# Patient Record
Sex: Female | Born: 1935 | Race: White | Hispanic: No | Marital: Married | Smoking: Former smoker
Health system: Southern US, Community
[De-identification: ages and names within clinical notes are randomized; demographics above are authoritative.]

## PROBLEM LIST (undated history)

## (undated) DIAGNOSIS — D369 Benign neoplasm, unspecified site: Secondary | ICD-10-CM

## (undated) DIAGNOSIS — I714 Abdominal aortic aneurysm, without rupture, unspecified: Secondary | ICD-10-CM

## (undated) DIAGNOSIS — Z720 Tobacco use: Secondary | ICD-10-CM

## (undated) DIAGNOSIS — K5790 Diverticulosis of intestine, part unspecified, without perforation or abscess without bleeding: Secondary | ICD-10-CM

## (undated) DIAGNOSIS — D649 Anemia, unspecified: Secondary | ICD-10-CM

## (undated) DIAGNOSIS — H269 Unspecified cataract: Secondary | ICD-10-CM

## (undated) DIAGNOSIS — I701 Atherosclerosis of renal artery: Secondary | ICD-10-CM

## (undated) DIAGNOSIS — N289 Disorder of kidney and ureter, unspecified: Secondary | ICD-10-CM

## (undated) DIAGNOSIS — K5900 Constipation, unspecified: Secondary | ICD-10-CM

## (undated) DIAGNOSIS — E785 Hyperlipidemia, unspecified: Secondary | ICD-10-CM

## (undated) DIAGNOSIS — I219 Acute myocardial infarction, unspecified: Secondary | ICD-10-CM

## (undated) DIAGNOSIS — I2510 Atherosclerotic heart disease of native coronary artery without angina pectoris: Secondary | ICD-10-CM

## (undated) DIAGNOSIS — I10 Essential (primary) hypertension: Secondary | ICD-10-CM

## (undated) DIAGNOSIS — K635 Polyp of colon: Secondary | ICD-10-CM

## (undated) DIAGNOSIS — E039 Hypothyroidism, unspecified: Secondary | ICD-10-CM

## (undated) HISTORY — DX: Constipation, unspecified: K59.00

## (undated) HISTORY — DX: Abdominal aortic aneurysm, without rupture, unspecified: I71.40

## (undated) HISTORY — DX: Anemia, unspecified: D64.9

## (undated) HISTORY — PX: CATARACT EXTRACTION, BILATERAL: SHX1313

## (undated) HISTORY — PX: POLYPECTOMY: SHX149

## (undated) HISTORY — DX: Polyp of colon: K63.5

## (undated) HISTORY — DX: Atherosclerotic heart disease of native coronary artery without angina pectoris: I25.10

## (undated) HISTORY — DX: Unspecified cataract: H26.9

## (undated) HISTORY — PX: COLONOSCOPY: SHX174

## (undated) HISTORY — PX: CARPAL TUNNEL RELEASE: SHX101

## (undated) HISTORY — DX: Disorder of kidney and ureter, unspecified: N28.9

## (undated) HISTORY — DX: Diverticulosis of intestine, part unspecified, without perforation or abscess without bleeding: K57.90

## (undated) HISTORY — DX: Essential (primary) hypertension: I10

## (undated) HISTORY — DX: Hyperlipidemia, unspecified: E78.5

## (undated) HISTORY — DX: Tobacco use: Z72.0

## (undated) HISTORY — DX: Abdominal aortic aneurysm, without rupture: I71.4

## (undated) HISTORY — PX: ABDOMINAL HYSTERECTOMY: SHX81

## (undated) HISTORY — DX: Atherosclerosis of renal artery: I70.1

## (undated) HISTORY — PX: CHOLECYSTECTOMY: SHX55

## (undated) HISTORY — DX: Hypothyroidism, unspecified: E03.9

## (undated) HISTORY — DX: Benign neoplasm, unspecified site: D36.9

---

## 1999-02-20 ENCOUNTER — Encounter: Admission: RE | Admit: 1999-02-20 | Discharge: 1999-05-21 | Payer: Self-pay | Admitting: Family Medicine

## 1999-03-27 ENCOUNTER — Encounter: Admission: RE | Admit: 1999-03-27 | Discharge: 1999-04-10 | Payer: Self-pay | Admitting: *Deleted

## 2002-06-27 ENCOUNTER — Encounter: Payer: Self-pay | Admitting: Gastroenterology

## 2003-06-05 ENCOUNTER — Encounter (INDEPENDENT_AMBULATORY_CARE_PROVIDER_SITE_OTHER): Payer: Self-pay | Admitting: Specialist

## 2003-06-05 ENCOUNTER — Observation Stay (HOSPITAL_COMMUNITY): Admission: RE | Admit: 2003-06-05 | Discharge: 2003-06-06 | Payer: Self-pay | Admitting: General Surgery

## 2004-03-12 ENCOUNTER — Ambulatory Visit: Payer: Self-pay | Admitting: Family Medicine

## 2004-06-03 ENCOUNTER — Ambulatory Visit: Payer: Self-pay | Admitting: Family Medicine

## 2004-09-16 ENCOUNTER — Ambulatory Visit: Payer: Self-pay | Admitting: Family Medicine

## 2004-11-05 ENCOUNTER — Ambulatory Visit: Payer: Self-pay | Admitting: Family Medicine

## 2004-12-02 ENCOUNTER — Ambulatory Visit: Payer: Self-pay | Admitting: Family Medicine

## 2005-01-24 ENCOUNTER — Encounter: Admission: RE | Admit: 2005-01-24 | Discharge: 2005-01-24 | Payer: Self-pay | Admitting: Orthopaedic Surgery

## 2005-02-14 ENCOUNTER — Encounter: Admission: RE | Admit: 2005-02-14 | Discharge: 2005-02-14 | Payer: Self-pay | Admitting: Orthopaedic Surgery

## 2005-03-07 ENCOUNTER — Encounter: Admission: RE | Admit: 2005-03-07 | Discharge: 2005-03-07 | Payer: Self-pay | Admitting: Orthopaedic Surgery

## 2005-04-18 ENCOUNTER — Ambulatory Visit: Payer: Self-pay | Admitting: Family Medicine

## 2005-10-29 ENCOUNTER — Ambulatory Visit: Payer: Self-pay | Admitting: Family Medicine

## 2006-01-13 ENCOUNTER — Ambulatory Visit: Payer: Self-pay | Admitting: Family Medicine

## 2006-02-19 ENCOUNTER — Ambulatory Visit: Payer: Self-pay | Admitting: Family Medicine

## 2006-03-04 ENCOUNTER — Ambulatory Visit: Payer: Self-pay | Admitting: Family Medicine

## 2006-03-11 ENCOUNTER — Ambulatory Visit: Payer: Self-pay | Admitting: Family Medicine

## 2006-03-17 HISTORY — PX: CARDIAC CATHETERIZATION: SHX172

## 2006-03-18 ENCOUNTER — Ambulatory Visit: Payer: Self-pay | Admitting: Family Medicine

## 2006-03-25 ENCOUNTER — Ambulatory Visit: Payer: Self-pay | Admitting: Family Medicine

## 2006-04-16 ENCOUNTER — Ambulatory Visit: Payer: Self-pay | Admitting: Cardiology

## 2006-04-16 ENCOUNTER — Inpatient Hospital Stay (HOSPITAL_COMMUNITY): Admission: AD | Admit: 2006-04-16 | Discharge: 2006-04-19 | Payer: Self-pay | Admitting: Cardiology

## 2006-04-23 ENCOUNTER — Ambulatory Visit: Payer: Self-pay | Admitting: Gastroenterology

## 2006-04-27 ENCOUNTER — Ambulatory Visit: Payer: Self-pay | Admitting: Family Medicine

## 2006-05-07 ENCOUNTER — Ambulatory Visit: Payer: Self-pay | Admitting: Cardiology

## 2006-05-20 ENCOUNTER — Ambulatory Visit: Payer: Self-pay | Admitting: Cardiology

## 2006-05-28 ENCOUNTER — Ambulatory Visit: Payer: Self-pay | Admitting: Family Medicine

## 2006-06-10 ENCOUNTER — Ambulatory Visit: Payer: Self-pay | Admitting: Family Medicine

## 2006-07-14 ENCOUNTER — Ambulatory Visit: Payer: Self-pay | Admitting: Family Medicine

## 2006-08-17 ENCOUNTER — Ambulatory Visit: Payer: Self-pay | Admitting: Family Medicine

## 2006-08-19 ENCOUNTER — Ambulatory Visit: Payer: Self-pay | Admitting: Cardiology

## 2006-09-29 ENCOUNTER — Ambulatory Visit: Payer: Self-pay | Admitting: Vascular Surgery

## 2006-10-08 ENCOUNTER — Ambulatory Visit: Payer: Self-pay | Admitting: Vascular Surgery

## 2006-10-08 ENCOUNTER — Inpatient Hospital Stay (HOSPITAL_COMMUNITY): Admission: RE | Admit: 2006-10-08 | Discharge: 2006-10-11 | Payer: Self-pay | Admitting: Vascular Surgery

## 2006-11-03 ENCOUNTER — Ambulatory Visit: Payer: Self-pay | Admitting: Vascular Surgery

## 2007-02-09 ENCOUNTER — Ambulatory Visit: Payer: Self-pay | Admitting: Vascular Surgery

## 2007-02-17 ENCOUNTER — Ambulatory Visit: Payer: Self-pay | Admitting: Cardiology

## 2007-02-26 ENCOUNTER — Ambulatory Visit: Payer: Self-pay

## 2007-02-26 ENCOUNTER — Encounter: Payer: Self-pay | Admitting: Cardiology

## 2007-06-29 ENCOUNTER — Ambulatory Visit: Payer: Self-pay | Admitting: Vascular Surgery

## 2007-11-03 ENCOUNTER — Ambulatory Visit: Payer: Self-pay | Admitting: Cardiology

## 2008-01-04 ENCOUNTER — Ambulatory Visit: Payer: Self-pay | Admitting: Vascular Surgery

## 2008-06-14 ENCOUNTER — Ambulatory Visit: Payer: Self-pay | Admitting: Cardiology

## 2008-06-20 ENCOUNTER — Ambulatory Visit: Payer: Self-pay | Admitting: Vascular Surgery

## 2008-09-04 ENCOUNTER — Telehealth (INDEPENDENT_AMBULATORY_CARE_PROVIDER_SITE_OTHER): Payer: Self-pay | Admitting: *Deleted

## 2008-09-05 ENCOUNTER — Encounter: Payer: Self-pay | Admitting: Cardiology

## 2008-09-05 ENCOUNTER — Ambulatory Visit: Payer: Self-pay

## 2008-09-08 ENCOUNTER — Ambulatory Visit: Payer: Self-pay | Admitting: Vascular Surgery

## 2008-10-27 ENCOUNTER — Encounter: Payer: Self-pay | Admitting: Cardiology

## 2008-11-01 ENCOUNTER — Encounter (INDEPENDENT_AMBULATORY_CARE_PROVIDER_SITE_OTHER): Payer: Self-pay | Admitting: *Deleted

## 2008-12-25 DIAGNOSIS — E785 Hyperlipidemia, unspecified: Secondary | ICD-10-CM | POA: Insufficient documentation

## 2008-12-25 DIAGNOSIS — I714 Abdominal aortic aneurysm, without rupture, unspecified: Secondary | ICD-10-CM | POA: Insufficient documentation

## 2008-12-25 DIAGNOSIS — N259 Disorder resulting from impaired renal tubular function, unspecified: Secondary | ICD-10-CM | POA: Insufficient documentation

## 2008-12-25 DIAGNOSIS — I701 Atherosclerosis of renal artery: Secondary | ICD-10-CM | POA: Insufficient documentation

## 2008-12-25 DIAGNOSIS — F17200 Nicotine dependence, unspecified, uncomplicated: Secondary | ICD-10-CM

## 2008-12-25 DIAGNOSIS — I2510 Atherosclerotic heart disease of native coronary artery without angina pectoris: Secondary | ICD-10-CM

## 2008-12-26 ENCOUNTER — Ambulatory Visit: Payer: Self-pay | Admitting: Vascular Surgery

## 2008-12-27 ENCOUNTER — Ambulatory Visit: Payer: Self-pay | Admitting: Cardiology

## 2009-01-29 ENCOUNTER — Encounter: Payer: Self-pay | Admitting: Cardiology

## 2009-02-07 ENCOUNTER — Telehealth (INDEPENDENT_AMBULATORY_CARE_PROVIDER_SITE_OTHER): Payer: Self-pay | Admitting: *Deleted

## 2009-03-17 DIAGNOSIS — K635 Polyp of colon: Secondary | ICD-10-CM

## 2009-03-17 HISTORY — DX: Polyp of colon: K63.5

## 2009-04-30 ENCOUNTER — Encounter (INDEPENDENT_AMBULATORY_CARE_PROVIDER_SITE_OTHER): Payer: Self-pay | Admitting: *Deleted

## 2009-05-04 ENCOUNTER — Encounter: Payer: Self-pay | Admitting: Cardiology

## 2009-05-25 ENCOUNTER — Telehealth: Payer: Self-pay | Admitting: Gastroenterology

## 2009-05-25 ENCOUNTER — Ambulatory Visit: Payer: Self-pay | Admitting: Gastroenterology

## 2009-05-28 ENCOUNTER — Ambulatory Visit: Payer: Self-pay | Admitting: Gastroenterology

## 2009-05-28 DIAGNOSIS — Z860100 Personal history of colon polyps, unspecified: Secondary | ICD-10-CM | POA: Insufficient documentation

## 2009-06-19 ENCOUNTER — Ambulatory Visit: Payer: Self-pay | Admitting: Vascular Surgery

## 2009-06-29 ENCOUNTER — Ambulatory Visit: Payer: Self-pay | Admitting: Gastroenterology

## 2009-07-02 ENCOUNTER — Telehealth: Payer: Self-pay | Admitting: Gastroenterology

## 2009-07-04 ENCOUNTER — Encounter: Payer: Self-pay | Admitting: Gastroenterology

## 2009-08-03 ENCOUNTER — Encounter: Payer: Self-pay | Admitting: Gastroenterology

## 2009-08-06 ENCOUNTER — Telehealth (INDEPENDENT_AMBULATORY_CARE_PROVIDER_SITE_OTHER): Payer: Self-pay | Admitting: *Deleted

## 2010-01-09 ENCOUNTER — Ambulatory Visit: Payer: Self-pay | Admitting: Cardiology

## 2010-02-27 ENCOUNTER — Ambulatory Visit: Payer: Self-pay | Admitting: Vascular Surgery

## 2010-04-05 ENCOUNTER — Encounter: Payer: Self-pay | Admitting: Cardiology

## 2010-04-18 NOTE — Consult Note (Signed)
Summary: [facility]  [facility]   Imported By: Marylou Mccoy 03/31/2009 11:06:42  _____________________________________________________________________  External Attachment:    Type:   Image     Comment:   External Document

## 2010-04-18 NOTE — Letter (Signed)
Summary: Previsit letter  [facility]  [address] [place], [place] [address]   Phone: [phone]  Fax: [phone]       04/30/2009 MRN: [id number]  [facility] [address] [place], [place]  [address]  Dear Ms. Bugarin,  Welcome to the Gastroenterology Division at [facility].    You are scheduled to see a nurse for your pre-procedure visit on 05-25-09 at 10:30am on the 3rd floor at [facility], [address]  We ask that you try to arrive at our office 15 minutes prior to your appointment time to allow for check-in.  Your nurse visit will consist of discussing your medical and surgical history, your immediate family medical history, and your medications.    Please bring a complete list of all your medications or, if you prefer, bring the medication bottles and we will list them.  We will need to be aware of both prescribed and over the counter drugs.  We will need to know exact dosage information as well.  If you are on blood thinners (Coumadin, Plavix, Aggrenox, Ticlid, etc.) please call our office today/prior to your appointment, as we need to consult with your physician about holding your medication.   Please be prepared to read and sign documents such as consent forms, a financial agreement, and acknowledgement forms.  If necessary, and with your consent, a friend or relative is welcome to sit-in on the nurse visit with you.  Please bring your insurance card so that we may make a copy of it.  If your insurance requires a referral to see a specialist, please bring your referral form from your primary care physician.  No co-pay is required for this nurse visit.     If you cannot keep your appointment, please call [phone] to cancel or reschedule prior to your appointment date.  This allows [place] the opportunity to schedule an appointment for another patient in need of care.    Thank you for choosing [facility] for your medical needs.  We  appreciate the opportunity to care for you.  Please visit [place] at our website  to learn more about our practice.                     Sincerely.                                                                                                                   The Gastroenterology Division

## 2010-04-18 NOTE — Miscellaneous (Signed)
Summary: [facility]  Clinical Lists Changes   pt is taking Plavix. Office visit scheduled with Dr. Arlyce Dice, colonoscopy cancelled.

## 2010-04-18 NOTE — Assessment & Plan Note (Signed)
Summary: [name]      Allergies Added:   Visit Type:  Follow-up Primary [name]:  [name]  CC:  CAD.  History of Present Illness: The patient presents for followup of her known coronary disease. Since I last saw her she has had no new cardiovascular complaints. She has been on the list for a kidney but actually her renal function has been stable with a creatinine of 2.99 she has been told she doesn't transplant at this point. She has had no new cardiovascular complaints. She stays active and works though she is not exercising routinely. With her activities of daily living she denies any chest pressure, neck or arm discomfort. She denies any shortness of breath, PND or orthopnea. She has had no palpitations, presyncope or syncope. She has had no weight gain or edema.  Current Medications (verified): 1)  Coreg 12.5 Mg Tabs (Carvedilol) .Marland Kitchen.. 1 By Mouth Two Times A Day 2)  Plavix 75 Mg Tabs (Clopidogrel Bisulfate) .Marland Kitchen.. 1 By Mouth Daily 3)  Colace 100 Mg Caps (Docusate Sodium) .Marland Kitchen.. 1 By Mouth Two Times A Day 4)  Procardia Xl 60 Mg Xr24h-Tab (Nifedipine) .Marland Kitchen.. 1 By Mouth Once Daily 5)  Aspirin 81 Mg Tbec (Aspirin) .Marland Kitchen.. 1 By Mouth Once Daily 6)  Crestor 20 Mg Tabs (Rosuvastatin Calcium) .Marland Kitchen.. 1 By Mouth Once Daily 7)  Hemorrhoidal-Hc 25 Mg Supp (Hydrocortisone Acetate) .... Put 1 in Rectum Every Night At Bedtime For 7 Nights. 8)  Nitrostat 0.4 Mg Subl (Nitroglycerin) .Marland Kitchen.. 1 By Mouth As Needed For Chest Pain As Directed  Allergies (verified): 1)  ! Sulfa  Past History:  Past Medical History: Coronary artery disease (50% proximal LAD   stenosis, 50% marginal stenosis of the circumflex, right coronary artery   95% stenosis, EF 40% stenosis with hypokinesis of the inferior wall.   She had a Taxus stent placed in January 31st 2008) Hypertension,  Abdominal aortic aneurysm Renal artery stenosis status post stenting of  the right renal, atrophic left  renal Dyslipidemia Hypothyroidism,  Previous tobacco use.  Adenomatous polyp 2004 Hyperplastic polyp    2004 Hemorrhoids Diverticulosis  Past Surgical History: Hysterectomy. Carpal tunnel repair. Cholecystectomy  Review of Systems       As stated in the HPI and negative for all other systems.   Vital Signs:  Patient profile:   76 year old female Height:      65 inches Weight:      145 pounds BMI:     24.22 Pulse rate:   62 / minute Resp:     16 per minute BP sitting:   112 / 64  (right arm)  Vitals Entered By: Marrion Coy, CNA (January 09, 2010 1:33 PM)  Physical Exam  General:  Well developed, well nourished, in no acute distress. Head:  normocephalic and atraumatic Eyes:  PERRLA/EOM intact; conjunctiva and lids normal. Neck:  Neck supple, no JVD. No masses, thyromegaly or abnormal cervical nodes. Chest Wall:  no deformities or breast masses noted Lungs:  Clear bilaterally to auscultation and percussion. Abdomen:  Bowel sounds positive; abdomen soft and non-tender without masses, organomegaly, or hernias noted. No hepatosplenomegaly. Msk:  Back normal, normal gait. Muscle strength and tone normal. Extremities:  No clubbing or cyanosis. Neurologic:  Alert and oriented x 3. Skin:  Intact without lesions or rashes. Cervical Nodes:  no significant adenopathy Inguinal Nodes:  no significant adenopathy Psych:  Normal affect.   Detailed Cardiovascular Exam  Neck    Carotids: Carotids  full and equal bilaterally without bruits.      Neck Veins: Normal, no JVD.    Heart    Inspection: no deformities or lifts noted.      Palpation: normal PMI with no thrills palpable.      Auscultation: regular rate and rhythm, S1, S2 without murmurs, rubs, gallops, or clicks.    Vascular    Abdominal Aorta: no palpable masses, pulsations, or audible bruits.      Femoral Pulses: normal femoral pulses bilaterally.      Pedal Pulses: normal pedal pulses bilaterally.      Radial  Pulses: normal radial pulses bilaterally.      Peripheral Circulation: no clubbing, cyanosis, or edema noted with normal capillary refill.     EKG  Procedure date:  01/09/2010  Findings:      Normal sinus rhythm, rate 62, axis within normal limits, intervals within normal limits, no acute ST-T wave changes  Impression & Recommendations:  Problem # 1:  CAD (ICD-414.00) She has no new symptoms. She had a stress perfusion study in 2009. No new evaluations or change in therapy are indicated. Orders: EKG w/ Interpretation (93000)  Problem # 2:  DYSLIPIDEMIA (ICD-272.4) I reviewed her lipids from May of this year with an LDL of 82 with HDL of 41. She will remain on the meds as listed.  Problem # 3:  AAA (ICD-441.4) I encouraged her to call the vascular surgery office to find out when she is to have followup of this.  Patient Instructions: 1)  Your physician recommends that you schedule a follow-up appointment in: 12 months with Dr Antoine Poche in [place] 2)  Your physician recommends that you continue on your current medications as directed. Please refer to the Current Medication list given to you today.

## 2010-04-18 NOTE — Letter (Signed)
Summary: [facility] Instructions  [facility]  [address] [place], [place] [address]   Phone: [phone]  Fax: [phone]       [name]    December 30, 1934    MRN: [id number]        Procedure Day /Date:FRIDAY 06/29/2009     Arrival Time:1PM     Procedure Time:2PM     Location of Procedure:                    X   [facility] (4th Floor)                        PREPARATION FOR COLONOSCOPY WITH MOVIPREP   Starting 5 days prior to your procedure 06/24/2009 do not eat nuts, seeds, popcorn, corn, beans, peas,  salads, or any raw vegetables.  Do not take any fiber supplements (e.g. Metamucil, Citrucel, and Benefiber).  THE DAY BEFORE YOUR PROCEDURE         DATE:06/28/2009  DAY: THURSDAY  1.  Drink clear liquids the entire day-NO SOLID FOOD  2.  Do not drink anything colored red or purple.  Avoid juices with pulp.  No orange juice.  3.  Drink at least 64 oz. (8 glasses) of fluid/clear liquids during the day to prevent dehydration and help the prep work efficiently.  CLEAR LIQUIDS INCLUDE: Water Jello Ice Popsicles Tea (sugar ok, no milk/cream) Powdered fruit flavored drinks Coffee (sugar ok, no milk/cream) Gatorade Juice: apple, white grape, white cranberry  Lemonade Clear bullion, consomm, broth Carbonated beverages (any kind) Strained chicken noodle soup Hard Candy                             4.  In the morning, mix first dose of MoviPrep solution:    Empty 1 Pouch A and 1 Pouch B into the disposable container    Add lukewarm drinking water to the top line of the container. Mix to dissolve    Refrigerate (mixed solution should be used within 24 hrs)  5.  Begin drinking the prep at 5:00 p.m. The MoviPrep container is divided by 4 marks.   Every 15 minutes drink the solution down to the next mark (approximately 8 oz) until the full liter is complete.   6.  Follow completed prep with 16 oz of clear liquid of your choice (Nothing red or  purple).  Continue to drink clear liquids until bedtime.  7.  Before going to bed, mix second dose of MoviPrep solution:    Empty 1 Pouch A and 1 Pouch B into the disposable container    Add lukewarm drinking water to the top line of the container. Mix to dissolve    Refrigerate  THE DAY OF YOUR PROCEDURE      DATE: 06/29/2009 DAY: FRIDAY Beginning at9a.m. (5 hours before procedure):         1. Every 15 minutes, drink the solution down to the next mark (approx 8 oz) until the full liter is complete.  2. Follow completed prep with 16 oz. of clear liquid of your choice.    3. You may drink clear liquids until 12PM (2 HOURS BEFORE PROCEDURE).   MEDICATION INSTRUCTIONS  Unless otherwise instructed, you should take regular prescription medications with a small sip of water   as early as possible the morning of your procedure.  Diabetic patients - see separate instructions.  Stop taking Plavix 06/22/2009    (7 days before procedure).   PER DR KAPLAN         OTHER INSTRUCTIONS  You will need a responsible adult at least 74 years of age to accompany you and drive you home.   This person must remain in the waiting room during your procedure.  Wear loose fitting clothing that is easily removed.  Leave jewelry and other valuables at home.  However, you may wish to bring a book to read or  an iPod/MP3 player to listen to music as you wait for your procedure to start.  Remove all body piercing jewelry and leave at home.  Total time from sign-in until discharge is approximately 2-3 hours.  You should go home directly after your procedure and rest.  You can resume normal activities the  day after your procedure.  The day of your procedure you should not:   Drive   Make legal decisions   Operate machinery   Drink alcohol   Return to work  You will receive specific instructions about eating, activities and medications before you leave.    The above instructions have  been reviewed and explained to me by   _______________________    I fully understand and can verbalize these instructions _____________________________ Date _________

## 2010-04-18 NOTE — Progress Notes (Signed)
Summary: Triage-Rectal Bleeding  Medications Added HEMORRHOIDAL-HC 25 MG SUPP (HYDROCORTISONE ACETATE) Put 1 in rectum every night at bedtime for 7 nights.       Phone Note Call from Patient Call back at Home Phone [phone]   Caller: Patient Call For: Dr. Arlyce Dice Reason for Call: Talk to Nurse Summary of Call: pt is having problems w/her hemorroids [facility] Initial call taken by: Karna Christmas,  July 02, 2009 10:21 AM  Follow-up for Phone Call        Message left for patient to callback. Laureen Ochs LPN  July 02, 2009 12:01 PM  Message left for patient to callback. Laureen Ochs LPN  July 02, 2009 3:36 PM  Message left for patient to callback. Laureen Ochs LPN  July 03, 2009 8:22 AM  Message left for patient to callback. Laureen Ochs LPN  July 03, 2009 2:49 PM    (Pt. had a Colon/polypectomy on 06-29-09. ) Pt. states her hemorrhoids were inflammed and bleeding before the Colonoscopy, pt. states it has almost stopped, but she would like some medicine for them. Denies rectal pain, burning, itching, constipation, fever, abd. pain.   1) Anusol HC supp. 1 pr qhs x 7  nights,sitz bath TID,Tucks pads to rectum TID,use baby wipes instead of toilet paper. 2) If symptoms become worse call back immediately or go to ER.   Follow-up by: Laureen Ochs LPN,  July 03, 2009 4:33 PM  Additional Follow-up for Phone Call Additional follow up Details #1::        Please call the office in 5 days to report an update of your condition. Pt had small polyps removed,  if bleeding worsens she needs to call and be evaluated.  Additional Follow-up by: Louis Meckel MD,  July 04, 2009 8:59 AM    Additional Follow-up for Phone Call Additional follow up Details #2::    Message left for pt. with above MD instructions. Follow-up by: Laureen Ochs LPN,  July 04, 2009 9:37 AM  New/Updated Medications: HEMORRHOIDAL-HC 25 MG SUPP (HYDROCORTISONE ACETATE) Put 1 in  rectum every night at bedtime for 7 nights. Prescriptions: HEMORRHOIDAL-HC 25 MG SUPP (HYDROCORTISONE ACETATE) Put 1 in rectum every night at bedtime for 7 nights.  #7 x 1   Entered by:   Laureen Ochs LPN   Authorized by:   Louis Meckel MD   Signed by:   Laureen Ochs LPN on 02/72/5366   Method used:   Electronically to        [facility] [phone]* (retail)       [address] [place], [place]  [address]       Ph: [phone] or [phone]       Fax: [phone]   RxID:   [id number]

## 2010-04-18 NOTE — Letter (Signed)
Summary: [facility] Office Note  [facility] Office Note   Imported By: Roderic Ovens 05/28/2009 15:00:33  _____________________________________________________________________  External Attachment:    Type:   Image     Comment:   External Document

## 2010-04-18 NOTE — Progress Notes (Signed)
Summary: Triage   Phone Note Call from Patient Call back at Home Phone [phone]   Caller: Patient Call For: Dr. Arlyce Dice Reason for Call: Talk to Nurse Summary of Call: pt. was in the office for a previsit. Is on BT and had to r/s appt. until 06-21-09 for OV. Would like a sooner appt. b/c she needs procedure done b/c she is trying to get on a waiting list at [facility] for a kidney transplant. Initial call taken by: Karna Christmas,  May 25, 2009 10:31 AM  Follow-up for Phone Call        Provide her a sooner ap't with MD or PA Follow-up by: Louis Meckel MD,  May 25, 2009 11:36 AM  Additional Follow-up for Phone Call Additional follow up Details #1::        (Pt. can see Dr.Thien Berka on 05-28-09 at 1:30pm.)  Message left for patient to callback.  Additional Follow-up by: Laureen Ochs LPN,  May 25, 2009 11:38 AM    Additional Follow-up for Phone Call Additional follow up Details #2::    Pt. confirmed she will be here for appt. 05-28-09 at 1:30pm. Follow-up by: Laureen Ochs LPN,  May 25, 2009 3:24 PM

## 2010-04-18 NOTE — Letter (Signed)
Summary: Patient Notice- Polyp Results  [facility]  [address] [place], [place] [address]   Phone: [phone]  Fax: [phone]        July 04, 2009 MRN: [id number]    [facility] [place] [address] [place], [place]  [address]    Dear Ms. Arlen,  I am pleased to inform you that the colon polyp(s) removed during your recent colonoscopy was (were) found to be benign (no cancer detected) upon pathologic examination.  I recommend you have a repeat colonoscopy examination in 5_ years to look for recurrent polyps, as having colon polyps increases your risk for having recurrent polyps or even colon cancer in the future.  Should you develop new or worsening symptoms of abdominal pain, bowel habit changes or bleeding from the rectum or bowels, please schedule an evaluation with either your primary care physician or with me.  Additional information/recommendations:  __ No further action with gastroenterology is needed at this time. Please      follow-up with your primary care physician for your other healthcare      needs.  __ Please call [phone] to schedule a return visit to review your      situation.  __ Please keep your follow-up visit as already scheduled.  _x_ Continue treatment plan as outlined the day of your exam.  Please call us if you are having persistent problems or have questions about your condition that have not been fully answered at this time.  Sincerely,  Louis Meckel MD  This letter has been electronically signed by your physician.  Appended Document: Patient Notice- Polyp Results letter mailed 4.25.11

## 2010-04-18 NOTE — Assessment & Plan Note (Signed)
Summary: (1:30PM APPT) NEEDS COLONOSCOPY, IS ON BLOOD THINNERS        ...    History of Present Illness Visit Type: Initial Visit Primary GI MD: Melvia Heaps MD [facility] Primary Provider: [name] Chief Complaint: discuss colon on blood thinner History of Present Illness:   [name] is a pleasant 76 year old white female referred at the request of Dr. Lysbeth Galas for a colonoscopy.  She has a history of adenomatous polyps and was last examined in 2004.  She has no GI complaints except for occasional hemorrhoidal discomfort with minimal amount of bleeding.  The patient has coronary artery disease and is on Plavix.  She has also has renal insufficiency and is scheduled to have a kidney transplant.   GI Review of Systems    Reports bloating.      Denies abdominal pain, acid reflux, belching, chest pain, dysphagia with liquids, dysphagia with solids, heartburn, loss of appetite, nausea, vomiting, vomiting blood, weight loss, and  weight gain.      Reports constipation, hemorrhoids, and  rectal bleeding.     Denies anal fissure, black tarry stools, change in bowel habit, diarrhea, diverticulosis, fecal incontinence, heme positive stool, irritable bowel syndrome, jaundice, light color stool, liver problems, and  rectal pain.    Current Medications (verified): 1)  Coreg 12.5 Mg Tabs (Carvedilol) .Marland Kitchen.. 1 By Mouth Two Times A Day 2)  Plavix 75 Mg Tabs (Clopidogrel Bisulfate) .Marland Kitchen.. 1 By Mouth Daily 3)  Colace 100 Mg Caps (Docusate Sodium) .Marland Kitchen.. 1 By Mouth Two Times A Day 4)  Procardia Xl 60 Mg Xr24h-Tab (Nifedipine) .Marland Kitchen.. 1 By Mouth Once Daily 5)  Aspirin 81 Mg Tbec (Aspirin) .Marland Kitchen.. 1 By Mouth Once Daily 6)  Crestor 20 Mg Tabs (Rosuvastatin Calcium) .Marland Kitchen.. 1 By Mouth Once Daily  Allergies (verified): 1)  ! Sulfa  Past History:  Past Medical History: Reviewed history from 05/28/2009 and no changes required. Coronary artery disease (50% proximal LAD   stenosis, 50% marginal stenosis of the  circumflex, right coronary artery   95% stenosis, EF 40% stenosis with hypokinesis of the inferior wall.   She had a Taxus stent placed in January 31st 2008), hypertension,   abdominal aortic aneurysm, renal artery stenosis status post stenting of   the right renal, atrophic left renal, dyslipidemia, hypothyroidism,   renal insufficiency, and previous tobacco use.    Adenomatous polyp 2004   hyperplastic polyp    2004 Hemorrhoids Diverticulosis  Past Surgical History:  1. Hysterectomy.  2. Carpal tunnel repair. Cholecystectomy  Family History: Reviewed history from 12/25/2008 and no changes required.  Father died of a myocardial infarction at age 54.  Brother died of a myocardial infarction at age 58.  Mother died of  cancer.  She has a sister with renal artery stenosis and a stent to that  artery, along with peripheral vascular disease.  Social History: Reviewed history from 12/25/2008 and no changes required. The patient smokes 3-4 cigarettes a day and has been  doing so x5 years.  Negative for ETOH.  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  Review of Systems       The patient complains of back pain and muscle pains/cramps.  The patient denies allergy/sinus, anemia, anxiety-new, arthritis/joint pain, blood in urine, breast changes/lumps, change in vision, confusion, cough, coughing up blood, depression-new, fainting, fatigue, fever, headaches-new, hearing problems, heart murmur, heart rhythm changes, itching, menstrual pain, night sweats, nosebleeds, pregnancy symptoms, shortness of breath, skin rash, sleeping  problems, sore throat, swelling of feet/legs, swollen lymph glands, thirst - excessive , urination - excessive , urination changes/pain, urine leakage, vision changes, and voice change.         All other systems were reviewed and were negative   Vital Signs:  Patient profile:   76 year old female Height:      65 inches Weight:      144.38 pounds BMI:      24.11 Pulse rate:   72 / minute Pulse rhythm:   regular BP sitting:   106 / 60  (left arm)  Vitals Entered By: Chales Abrahams CMA Duncan Dull) (May 28, 2009 1:43 PM)  Physical Exam  Additional Exam:  On physical exam she is a well-developed well-nourished female  skin: anicteric HEENT: normocephalic; PEERLA; no nasal or pharyngeal abnormalities neck: supple nodes: no cervical lymphadenopathy chest: clear to ausculatation and percussion heart: no murmurs, gallops, or rubs abd: soft, nontender; BS normoactive; no abdominal masses, tenderness, organomegaly rectal: deferred ext: no cynanosis, clubbing, edema skeletal: no deformities neuro: oriented x 3; no focal abnormalities    Impression & Recommendations:  Problem # 1:  PERSONAL HISTORY OF COLONIC POLYPS (ICD-V12.72) Patient will be scheduled for followup colonoscopy.  Plavix will be held in advance of the procedure.  Risks, alternatives, and complications of the procedure, including bleeding, perforation, and possible need for surgery, were explained to the patient.  Patient's questions were answered.  Problem # 2:  RENAL INSUFFICIENCY (ICD-588.9) Assessment: Comment Only  Problem # 3:  CAD (ICD-414.00) Assessment: Comment Only  Patient Instructions: 1)  Your colonoscopy is scheduled for 06/29/2009 at 2pm 2)  You can pick up your MoviPrep from your pharmacy today 3)  Hold Plavix 7 days prior to your procedure per Dr Arlyce Dice 4)  The medication list was reviewed and reconciled.  All changed / newly prescribed medications were explained.  A complete medication list was provided to the patient / caregiver. Prescriptions: MOVIPREP 100 GM  SOLR (PEG-KCL-NACL-NASULF-NA ASC-C) As per prep instructions.  #1 x 0   Entered by:   Merri Ray CMA (AAMA)   Authorized by:   Louis Meckel MD   Signed by:   Merri Ray CMA (AAMA) on 05/28/2009   Method used:   Electronically to        [facility] [phone]* (retail)       [address] [place], [place]  [address]       Ph: [phone] or [phone]       Fax: [phone]   RxID:   [id number]   Appended Document: Orders Update    Clinical Lists Changes  Orders: Added new Test order of Colonoscopy (Colon) - Signed

## 2010-04-18 NOTE — Procedures (Signed)
Summary: Colonoscopy  Patient: [name] Note: All result statuses are Final unless otherwise noted.  Tests: (1) Colonoscopy (COL)   COL Colonoscopy           DONE     [facility]     [address]     [place], [place]  [address]           COLONOSCOPY PROCEDURE REPORT           PATIENT:  [name], [name]  MR#:  [id number]     BIRTHDATE:  1934-07-12, 74 yrs. old  GENDER:  female           ENDOSCOPIST:  Barbette Hair. Arlyce Dice, MD     Referred by:           PROCEDURE DATE:  06/29/2009     PROCEDURE:  Colonoscopy with snare polypectomy, Colon with cold     biopsy polypectomy     ASA CLASS:  Class II     INDICATIONS:  1) history of pre-cancerous (adenomatous) colon     polyps  2) screening           MEDICATIONS:   Fentanyl 75 mcg IV, Versed 7 mg IV           DESCRIPTION OF PROCEDURE:   After the risks benefits and     alternatives of the procedure were thoroughly explained, informed     consent was obtained.  Digital rectal exam was performed and     revealed no abnormalities.   The LB CF-[id number] endoscope     was introduced through the anus and advanced to the cecum, which     was identified by both the appendix and ileocecal valve, without     limitations.  The quality of the prep was excellent, using     MoviPrep.  The instrument was then slowly withdrawn as the colon     was fully examined.     <<PROCEDUREIMAGES>>           FINDINGS:  A sessile polyp was found in the descending colon. It     was 3 mm in size. Polyp was snared without cautery. Retrieval was     successful (see image1). snare polyp  A sessile polyp was found in     the sigmoid colon. It was 2 mm in size. It was found 25 cm from     the point of entry. The polyp was removed using cold biopsy     forceps.  A sessile polyp was found in the rectum. It was 3 mm in     size. It was found 2 cm from the point of entry. Polyp was snared     without cautery. Retrieval was successful (see image12). snare  polyp  Severe diverticulosis was found in the sigmoid colon (see     image11 and image10). Severe amount of spasm, moderate muscular     hypertrophy  This was otherwise a normal examination of the colon     (see image3, image4, image5, image6, and image14).  There were     multiple polyps identified and removed. in the rectum (see     image15). Multiple 1-23mm sessile polyps with normal overlying     mucosa c/w hyperplastic polyp   Retroflexed views in the rectum     revealed no abnormalities.    The time to cecum =  10  minutes.     The scope was then withdrawn (time =  9.5  min) from the patient     and the procedure completed.           COMPLICATIONS:  None           ENDOSCOPIC IMPRESSION:     1) 3 mm sessile polyp in the descending colon     2) 2 mm sessile polyp in the sigmoid colon     3) 3 mm sessile polyp in the rectum     4) Severe diverticulosis in the sigmoid colon     5) Polyps, multiple in the rectum     6) Otherwise normal examination     RECOMMENDATIONS:     1) If the polyp(s) removed today are proven to be adenomatous     (pre-cancerous) polyps, you will need a repeat colonoscopy in 5     years. Otherwise you should continue to follow colorectal cancer     screening guidelines for "routine risk" patients with colonoscopy     in 10 years.     2) resume plavix in 5 days           REPEAT EXAM:   You will receive a letter from Dr. Arlyce Dice in 1-2     weeks, after reviewing the final pathology, with followup     recommendations.           ______________________________     Barbette Hair Arlyce Dice, MD           CC: Joette Catching, MD           n.     Rosalie Doctor:   Barbette Hair. Kaplan at 06/29/2009 03:13 PM           Page 2 of 3   Gunda, [place], [phone]  Note: An exclamation mark (!) indicates a result that was not dispersed into the flowsheet. Document Creation Date: 06/29/2009 3:14 PM _______________________________________________________________________  (1) Order result  status: Final Collection or observation date-time: 06/29/2009 15:01 Requested date-time:  Receipt date-time:  Reported date-time:  Referring Physician:   Ordering Physician: Melvia Heaps [phone]) Specimen Source:  Source: Launa Grill Order Number: [id number] Lab site:   Appended Document: Colonoscopy     Procedures Next Due Date:    Colonoscopy: 06/2014

## 2010-04-18 NOTE — Progress Notes (Signed)
  Phone Note Other Incoming   Request: Send information Summary of Call: Labs from Dr. Lysbeth Galas received and forwarded to Dr. Arlyce Dice for review.

## 2010-04-18 NOTE — Procedures (Signed)
Summary: colonoscopy   Colonoscopy  Procedure date:  06/27/2002  Findings:      Results: Polyp.  Results: Hemorrhoids.     Results: Diverticulosis.       Location:  [facility].   Patient Name: [name], [name] MRN:  Procedure Procedures: Colonoscopy CPT: 6817380367.    with Hot Biopsy(s)CPT: Z451292.  Personnel: Endoscopist: Barbette Hair. Arlyce Dice, MD.  Referred By: Monika Salk, DO.  Indications  Average Risk Screening Routine.  History  Pre-Exam Physical: Performed Jun 27, 2002. Entire physical exam was normal.  Exam Exam: Extent of exam reached: Cecum, extent intended: Cecum.  The cecum was identified by IC valve. Colon retroflexion performed. ASA Classification: II. Tolerance: good.  Monitoring: Pulse and BP monitoring, Oximetry used. Supplemental O2 given. at 2 Liters.  Colon Prep Used Golytely for colon prep. Prep results: good.  Sedation Meds: Fentanyl 100 mcg. given IV. Versed 8 mg. given IV.  Findings NORMAL EXAM: Descending Colon, Maximum size: 2 mm. Procedure:  hot biopsy, Polyp sent to pathology. ICD9: Colon Polyps: 211.3.  - DIVERTICULOSIS: Descending Colon to Sigmoid Colon. ICD9: Diverticulosis: 562.10. Comments: Diffuse diverticulosis.  NORMAL EXAM: Cecum.  POLYP: Sigmoid Colon, Maximum size: 2 mm. Distance from Anus 30 cm. Procedure:  hot biopsy, sent to pathology. ICD9: Colon Polyps: 211.3.  POLYP: Rectum, Maximum size: 2 mm. Distance from Anus 4 cm. Procedure:  hot biopsy, sent to pathology. ICD9: Colon Polyps: 211.3.  HEMORRHOIDS: Internal. ICD9: Hemorrhoids, Internal: 455.0.   Assessment Abnormal examination, see findings above.  Diagnoses: 211.3: Colon Polyps.  562.10: Diverticulosis.  455.0: Hemorrhoids, Internal.   Events  Unplanned Interventions: No intervention was required.  Unplanned Events: There were no complications. Plans Patient Education: Patient given standard instructions for: Polyps. Diverticulosis. Hemorrhoids.   Scheduling/Referral: Colonoscopy, to Barbette Hair. Arlyce Dice, MD, around Jun 27, 2007.    This report was created from the original endoscopy report, which was reviewed and signed by the above listed endoscopist.

## 2010-05-08 NOTE — Letter (Signed)
Summary: [facility] Patient Note   [facility] Patient Note   Imported By: Roderic Ovens 05/02/2010 12:39:34  _____________________________________________________________________  External Attachment:    Type:   Image     Comment:   External Document

## 2010-06-15 ENCOUNTER — Other Ambulatory Visit: Payer: Self-pay | Admitting: Cardiology

## 2010-07-30 NOTE — Procedures (Signed)
RENAL ARTERY DUPLEX EVALUATION   INDICATION:  Follow up right renal artery stent.   HISTORY:  Diabetes:  No.  Cardiac:  MI.  Hypertension:  Yes.  Smoking:  No.   RENAL ARTERY DUPLEX FINDINGS:  Aorta-Proximal:  51 cm/s  Aorta-Mid:  72 cm/s  Aorta-Distal:  40 cm/s  Celiac Artery Origin:  182 cm/s  SMA Origin:  286 cm/s                                    RIGHT               LEFT  Renal Artery Origin:             112 cm/s  Renal Artery Proximal:           119 cm/s  Renal Artery Mid:                67 cm/s  Renal Artery Distal:             80 cm/s  Hilar Acceleration Time (AT):  Renal-Aortic Ratio (RAR):        2.3  Kidney Size:                     12.0 cm  End Diastolic Ratio (EDR):  Resistive Index (RI):            0.71   IMPRESSION:  1. Patent right renal artery stent with no evidence of focal stenosis.  2. Right kidney appears within normal limits in regard to shape and      size.  3. Elevated velocities noted in the superior mesenteric artery origin.  4. Stable abdominal aortic aneurysm with the largest measurement of      3.84 cm X 3.71 cm.  5. No significant changes from previous study.   ___________________________________________  [name], M.D.   AS/MEDQ  D:  06/19/2009  T:  06/19/2009  Job:  [id number]

## 2010-07-30 NOTE — Procedures (Signed)
DUPLEX ULTRASOUND OF ABDOMINAL AORTA   INDICATION:  Follow up abdominal aortic aneurysm.   HISTORY:  Diabetes:  No.  Cardiac:  MI, January 2008.  Hypertension:  Yes.  Smoking:  No.  Connective Tissue Disorder:  Family History:  Yes.  Previous Surgery:  No.   DUPLEX EXAM:         AP (cm)                   TRANSVERSE (cm)  Proximal             1.95 cm                   1.89 cm  Mid                  2.35 cm                   2.35 cm  Distal               3.28 cm                   3.38 cm  Right Iliac          1.84 cm                   1.70 cm  Left Iliac           1.42 cm                   1.46 cm   PREVIOUS:  Date: October 08, 2004.  AP:  2.9  TRANSVERSE:  3.1.  Previous  aorta.   PREVIOUS CIA:  Right = 1.3 x 1.4, left = 1.5 x 1.5.   IMPRESSION:  The abdominal aortic aneurysm and the common iliac arteries  have increased in size since previous study.   Bilateral iliacs have calcific plaque noted with increased velocities in  the right, with velocities suggestive of greater than 50% stenosis at  262 centimeters per second.   ___________________________________________  Quita Skye. Hart [place], M.D.   AS/MEDQ  D:  09/29/2006  T:  09/29/2006  Job:  [id number]

## 2010-07-30 NOTE — Procedures (Signed)
RENAL ARTERY DUPLEX EVALUATION   INDICATION:  Follow-up evaluation, status post right renal artery stent  in July, 2008.  Left renal artery occlusion.   HISTORY:  Diabetes:  No.  Cardiac:  MI in January, 2008.  Sudden onset, jaw pain, yesterday.  Hypertension:  Yes.  Smoking:  No.   RENAL ARTERY DUPLEX FINDINGS:  Aorta-Proximal:  63 cm/s  Aorta-Mid:  79 cm/s  Aorta-Distal:  113 cm/s  Celiac Artery Origin:  107 cm/s  SMA Origin:  271 cm/s                                    RIGHT               LEFT  Renal Artery Origin:             130/35 cm/s  Renal Artery Proximal:           43/17 cm/s  Renal Artery Mid:                67/18 cm/s  Renal Artery Distal:             48/10 cm/s  Hilar Acceleration Time (AT):    0.05 m/s2  Renal-Aortic Ratio (RAR):        2.1  Kidney Size:                     10.13 cm X 5.4 cm  End Diastolic Ratio (EDR):       0.22 to 0.29  Resistive Index (RI):            0.61   IMPRESSION:  1. Right common iliac artery peak systolic velocity 168 cm/s.  2. Renal to aortic ratio suggests less than 60% right renal artery      stenosis.  3. Right kidney is normal with respect to size and shape.  4. No evidence of parenchymal disease in the right kidney.  5. Left kidney was not evaluated secondary to known left renal artery      occlusion.   ___________________________________________  [name], M.D.   MC/MEDQ  D:  02/09/2007  T:  02/09/2007  Job:  [id number]

## 2010-07-30 NOTE — Assessment & Plan Note (Signed)
OFFICE VISIT   Rosamilia, Krystian B  DOB:  1935/01/12                                       02/09/2007  ZOXWR#:[id number]   The patient returns 4 months post PTA and stenting of her right renal  artery for renal insufficiency.  She has an atropic left kidney and has  severe right renal artery stenosis.  She had an excellent result with  good flow to the right kidney and correction of the severe stenotic  lesion.  Her creatinine was 3.6 at that time and the most recent study  on January 27, 2007 reveals a creatinine of 3.21 with a BUN of 28.  Blood pressure has been under good control and she is back for routine  followup.   PHYSICAL EXAM:  Blood pressure 124/70, heart rate is 58, respirations  12.  She is alert and oriented x3.  Her neck is supple.  3+ carotid  pulses palpable.  No bruits are audible.  Chest is clear to  auscultation.  Cardiovascular exam is regular rhythm with no murmurs.  Her abdomen is soft with no palpable masses.  No bruits are audible.  She has 3+ femoral and posterior tibial pulses bilaterally.   Renal scan was performed in the office today, which reveals no evidence  of significant restenosis of the right osteal renal artery lesion with  the velocity of only 130 cm per second at the origin compared to 310  prior to her procedure.  The left kidney is atrophic.   She is reassured regarding these findings and we will continue to follow  her every 6 months since her right kidney is the only renal function she  has.  She is to return in 6 months with a renal scan and see me at that  time.   [name], M.D.  Electronically Signed   JDL/MEDQ  D:  02/09/2007  T:  02/10/2007  Job:  [id number]

## 2010-07-30 NOTE — Assessment & Plan Note (Signed)
[facility]                            CARDIOLOGY OFFICE NOTE   [name], [name]                         MRN:          [id number]  DATE:12/27/2008                            DOB:          June 04, 1934    PRIMARY CARE PHYSICIAN:  Delaney Meigs, MD   REASON FOR PRESENTATION:  Evaluate the patient with coronary disease.   HISTORY OF PRESENT ILLNESS:  The patient is a 76 year old with coronary  disease as described below.  Since I last saw her, she did have a stress  perfusion study in preparation for possible kidney transplant, though  she has not yet had the kidney transplant evaluation.  This was done in  June.  The EF was 72%.  There was no evidence of ischemia or infarct.   The patient reports she has done well.  She still works.  She has had no  new chest discomfort, neck or arm discomfort.  She has had no new  palpitations, presyncope, or syncope.  She has been having her renal  function followed by Dr. Lowell Guitar.  She has had her renal artery stent and  abdominal aneurysm recently followed and these have been stable.   PAST MEDICAL HISTORY:  1. Coronary artery disease (50% proximal LAD stenosis, 50% marginal      stenosis of the circumflex, right coronary artery 95% stenosis, EF      40% stenosis with hypokinesis in the inferior wall.  She had a      Taxus stent placed in April 16, 2006).  2. Hypertension.  3. Abdominal aortic aneurysm.  4. Renal artery stenosis status post stenting of the right renal,      atrophic left renal.  5. Dyslipidemia.  6. Hypothyroidism.  7. Renal insufficiency.  8. Previous tobacco use.   ALLERGIES:  SULFA.   MEDICATIONS:  1. Coreg 12.5 mg b.i.d.  2. Plavix 75 mg daily.  3. Colace 100 mg daily.  4. Procardia 60 mg daily.  5. Lasix 40 mg daily.  6. Aspirin 81 mg daily.  7. Synthroid 75 mcg daily.  8. Crestor 20 mg daily.   REVIEW OF SYSTEMS:  As stated in the HPI and otherwise negative for  other  systems.   PHYSICAL EXAMINATION:  GENERAL:  The patient is in no distress.  VITAL SIGNS:  Blood pressure 126/76, heart rate 61 and regular, weight  146 pounds, body mass index 33.  HEENT:  Eyelids unremarkable; pupils equal, round, and reactive to  light; fundi not visualized; oral mucosa unremarkable.  NECK:  No jugular venous distention at 45 degrees.  Carotid upstroke  brisk and symmetrical.  No bruits, no thyromegaly.  LYMPHATICS:  No adenopathy.  LUNGS:  Clear to auscultation bilaterally.  BACK:  No costovertebral angle tenderness.  CHEST:  Unremarkable.  HEART:  PMI not displaced or sustained.  S1 and S2 within normal limits.  No S3, no S4, no clicks, no rubs, no murmurs.  ABDOMEN:  Flat, positive bowel sounds, normal in frequency and pitch, no  bruits, no rebound, no guarding, no midline pulsatile mass, no  organomegaly.  SKIN:  No rashes, no nodules.  EXTREMITIES:  2+ pulse, no edema.  NEURO:  Grossly intact.   EKG, sinus rhythm, rate 61, axis within normal limits, intervals within  normal limits, no acute ST-T wave changes.   ASSESSMENT/PLAN:  1. Coronary disease.  The patient had negative stress perfusion study.      She has had no new symptoms.  No further cardiovascular testing is      suggested.  She should continue with aggressive risk reduction.      Certainly I would be happy to comment if she gets closer to renal      transplantation on her risk at that time.  2. Renal insufficiency.  Again, this is followed closely by Dr.      Lowell Guitar.  3. Dyslipidemia per Dr. Lysbeth Galas with a goal LDL in the 70s and HDL in      the 50s.  4. Followup.  I will see the patient back in 12 months or sooner if      needed.     Rollene Rotunda, MD, [facility]  Electronically Signed    JH/MedQ  DD: 12/27/2008  DT: 12/28/2008  Job #: [id number]   cc:   Delaney Meigs, M.D.

## 2010-07-30 NOTE — Assessment & Plan Note (Signed)
[facility]                            CARDIOLOGY OFFICE NOTE   NAME:[name], [name]                         MRN:          [id number]  DATE:08/19/2006                            DOB:          04-May-1934    PRIMARY:  Dr. Joette Catching.   REASON FOR PRESENTATION:  Evaluate the patient with coronary artery  disease.   HISTORY OF PRESENT ILLNESS:  The patient is a pleasant 76 year old with  coronary artery disease as described below.  She returns for followup.  She has been having no new symptoms since I last saw her.  She has been  participating in cardiac rehab.  She is not getting any chest or neck  discomfort.  She is having no palpitations, pre-syncope, or syncope.  She is having no PND or orthopnea.  She is seeing Dr. Lowell Guitar later this  month for her renal insufficiency.  She says she has stable elevated  creatinine.   PAST MEDICAL HISTORY:  Coronary artery disease (50% proximal LAD  stenosis, 50% marginal stenosis off the circumflex, right coronary  artery 95% stenosis, EF 40% stenosis with hypokinesis to the inferior  wall.  She had a TAXUS stent April 16, 2006).  Hypertension.  Abdominal aortic aneurysm followed by Dr. Hart Rochester.  Dyslipidemia.  Hypothyroidism.  Renal insufficiency.  Previous tobacco use.   ALLERGIES:  SULFA.   CURRENT MEDICATIONS:  1. Coreg 12.5 mg b.i.d.  2. Plavix 75 mg daily.  3. Synthroid 75 mcg daily.  4. Lipitor 20 mg daily.  5. Aspirin 325 mg daily.  6. Colace 100 mg b.i.d.  7. Procardia 60 mg daily.  8. Lasix 40 mg daily.   REVIEW OF SYSTEMS:  As stated in the HPI, and otherwise negative for  other systems.   PHYSICAL EXAMINATION:  The patient is in no distress.  Blood pressure 130/72, heart rate 55 and regular, weight 137 pounds.  NECK:  No jugular venous distension.  Wave form within normal limits.  Carotid upstroke brisk and symmetric, no bruits, thyromegaly.  LYMPHATICS:  No adenopathy.  LUNGS:  Clear  to auscultation bilaterally.  BACK:  No costovertebral angle tenderness.  CHEST:  Unremarkable.  HEART:  PMI not displaced or sustained, S1 and S2 within normal limits,  no S3, no S4, no clicks, rubs, or murmurs.  ABDOMEN:  Flat, positive bowel sounds, normal in frequency and pitch, no  bruits, rebound, guarding.  No midline pulsatile masses, hepatomegaly,  splenomegaly.  SKIN:  No rashes, no nodules.  EXTREMITIES:  With 2+ pulses, no edema.   EKG:  Sinus rhythm.  Rate 55, axis within normal limits.  Intervals  within normal limits.  No acute ST-T wave changes.   ASSESSMENT AND PLAN:  1. Coronary artery disease.  The patient is having no new symptoms.      No further cardiovascular testing is suggested.  She will continue      with secondary risk reduction.  2. Dyslipidemia is followed by Dr. Lysbeth Galas.  The goal is an LDL less      than 100 and HDL in the  50s.  3. Renal insufficiency.  This is followed by Dr. Lowell Guitar.  I cannot      find evidence of a renal arteriogram or ultrasound.  I have asked      her to ask Dr. Lowell Guitar about this.  If he does not have these      studies in his records, she could have at least a renal Doppler      ordered to be done when she sees Dr. Hart Rochester later this summer.      Again, the patient will discuss this with Dr. Lowell Guitar.  4. Abdominal aortic aneurysm.  The patient is to see Dr. Hart Rochester later      this summer.  5. Hypertension.  Blood pressure is well controlled and she will      continue the medications as listed.  6. Followup.  I am going to see her back in 6 months.  At that point,      I am probably going to repeat an echocardiogram for evaluation of      her mildly reduced ejection fraction.  7. Medications.  The patient is not to discontinue the Plavix for at      least 12 months from the time of her stent.  At this point, I plan      to continue it indefinitely.     Rollene Rotunda, MD, [facility]  Electronically Signed    JH/MedQ  DD:  08/19/2006  DT: 08/19/2006  Job #: [id number]   cc:   Mindi Slicker. Lowell Guitar, M.D.  [name], M.D.  Delaney Meigs, M.D.

## 2010-07-30 NOTE — Assessment & Plan Note (Signed)
[facility]                            CARDIOLOGY OFFICE NOTE   NAME:[name], [name]                         MRN:          [id number]  DATE:02/17/2007                            DOB:          [birth year]    PRIMARY:  Dr. Joette Catching.   REASON FOR PRESENTATION:  Evaluate the patient for coronary artery  disease and mildly reduced ejection fraction.   HISTORY OF PRESENT ILLNESS:  The patient is 76 years old.  She presents  for followup of the above.  Since I last saw her, she was seen by Dr.  Hart Rochester and had stenting of her right renal artery.  She has an atrophic  left kidney with apparent occluded left renal artery.  Post procedure,  she had to have repair of her right common femoral artery, persistent  bleed.   She has not been having any recent cardiovascular symptoms.  She denies  any chest discomfort, neck or arm discomfort.  She has not been having  any palpitations, pre-syncope, or syncope.  She has been having no PND  or orthopnea.  She works full time.  She does not exercise routinely,  however.   PAST MEDICAL HISTORY:  Coronary artery disease (50% proximal LAD  stenosis, 50% marginal stenosis of the circumflex, right coronary artery  95% stenosis, EF 40% stenosis with hypokinesis of the inferior wall, she  had a TAXUS stent April 16, 2006).  Hypertension.  Abdominal aortic  aneurysm followed by Dr. Hart Rochester.  Renal artery stenosis status post  stenting of the right of an atrophic left renal artery.  Dyslipidemia.  Hypothyroidism.  Persistent renal insufficiency (she reports her  creatinine is 3.2).  Previous tobacco use.   ALLERGIES:  SULFA.   CURRENT MEDICATIONS:  1. Carvedilol 12.5 mg b.i.d.  2. Plavix 75 mg daily.  3. Aspirin 325 mg daily.  4. Colace 100 mg b.i.d.  5. Procardia 60 mg daily.  6. Lasix 40 mg daily.  7. Synthroid 88 mcg daily.  8. Lipitor 40 mg daily.   REVIEW OF SYSTEMS:  As stated in the history of present  illness and  otherwise negative for other systems.   PHYSICAL EXAMINATION:  The patient is in no distress.  Blood pressure 120/74, heart rate 54 and regular, weight 136 pounds,  body mass index 23.  HEENT:  Eyelids unremarkable.  Pupils are equal, round, and reactive to  light and accommodation.  Fundi are not visualized.  Oral mucosa  unremarkable.  NECK:  No jugular venous distension, wave form within normal limits,  carotid upstroke brisk and symmetric, no bruits, thyromegaly.  LYMPHATICS:  No adenopathy.  LUNGS:  Clear to auscultation bilaterally.  BACK:  No costovertebral angle tenderness.  CHEST:  Unremarkable.  HEART:  PMI not displaced or sustained, S1 and S2 within normal limits,  no S3, no S4, no clicks, rubs, murmurs.  ABDOMEN:  Flat, positive bowel sounds, normal in frequency and pitch, no  bruits, rebound, guarding.  No midline pulsatile masses, hepatomegaly,  splenomegaly.  SKIN:  No rashes, no nodules.  EXTREMITIES:  With 2+  pulses throughout, no edema, cyanosis, clubbing.  NEURO:  Oriented to person, place, and time, cranial nerves 2-12 grossly  intact, motor grossly intact.   EKG sinus bradycardia, rate 54, axis within normal limits, intervals  within normal limits, no acute ST-T wave changes.   ASSESSMENT AND PLAN:  1. Cardiomyopathy.  The patient has a mildly reduced ejection      fraction.  We are going to get an echocardiogram to evaluate      whether there has been any improvement in this.  I will titrate her      meds accordingly.  2. Hypertension.  Blood pressure is well controlled.  We, of course,      need to avoid an ACE or ARB with her renal insufficiency.  I cannot      titrate her carvedilol further because of her bradycardia.  3. Coronary artery disease.  The patient is not having any symptoms      related to this.  She is going to remain on the Plavix for at least      a year, and then we will consider when to discontinue this.  She      can go  down to 81 mg of aspirin.  4. Hypothyroidism.  She remains on thyroid replacement per Dr. Lysbeth Galas.  5. Dyslipidemia.  She is having this followed closely by Dr. Lysbeth Galas,      and reports that she had a good lipid profile when it was checked      recently.  6. Renal artery stenosis per Dr. Lowell Guitar and Dr. Hart Rochester.  7. Abdominal aortic aneurysm.  This is apparently 3x4x3.3 cm and      followed by Dr. Hart Rochester.  8. Followup.  I will see her back in about 4 months or sooner if      needed.     Rollene Rotunda, MD, [facility]  Electronically Signed    JH/MedQ  DD: 02/17/2007  DT: 02/17/2007  Job #: [id number]   cc:   Delaney Meigs, M.D.

## 2010-07-30 NOTE — Procedures (Signed)
DUPLEX ULTRASOUND OF ABDOMINAL AORTA   INDICATION:  Followup evaluation of known abdominal aortic aneurysm.   HISTORY:  Diabetes:  No.  Cardiac:  MI in 2008.  Hypertension:  Yes.  Smoking:  No.  Connective Tissue Disorder:  Family History:  Previous Surgery:  Right renal artery stent.   DUPLEX EXAM:         AP (cm)                   TRANSVERSE (cm)  Proximal             2.3 cm                    2.6 cm  Mid                  2.6 cm                    2.5 cm  Distal               3.6 cm                    3.9 cm  Right Iliac          1.1 cm                    1.1 cm  Left Iliac           0.7 cm                    0.9 cm   PREVIOUS:  Date:  01/04/2008  AP:  3.1  TRANSVERSE:  3.9   IMPRESSION:  Abdominal aortic aneurysm measurements are stable compared  to previous study.   ___________________________________________  [name], M.D.   MC/MEDQ  D:  09/08/2008  T:  09/08/2008  Job:  [id number]

## 2010-07-30 NOTE — Procedures (Signed)
DUPLEX ULTRASOUND OF ABDOMINAL AORTA   INDICATION:  Follow-up evaluation of known abdominal aortic aneurysm.   HISTORY:  Diabetes:  No.  Cardiac:  MI in 2008.  Hypertension:  Yes.  Smoking:  No.  Connective Tissue Disorder:  Family History:  Previous Surgery:   DUPLEX EXAM:         AP (cm)                   TRANSVERSE (cm)  Proximal             1.8 cm                    1.9 cm  Mid                  2.4 cm                    2.4 cm  Distal               3.1 cm                    3.9 cm  Right Iliac          1.8 cm                    1.1 cm  Left Iliac           1.3 cm                    1.7 cm   PREVIOUS:  Date: 06/29/2007  AP:  3.2  TRANSVERSE:  3.2   IMPRESSION:  Abdominal aortic aneurysm is slightly larger than  previously recorded.   ___________________________________________  [name], M.D.   MC/MEDQ  D:  01/04/2008  T:  01/04/2008  Job:  [id number]

## 2010-07-30 NOTE — Op Note (Signed)
NAMESHANECE, COCHRANE                  ACCOUNT NO.:  [id number]   MEDICAL RECORD NO.:  [id number]          PATIENT TYPE:  OBV   LOCATION:  3314                         FACILITY:  [facility]   PHYSICIAN:  Di Kindle. Edilia Bo, M.D.DATE OF BIRTH:  1934/06/16   DATE OF PROCEDURE:  10/08/2006  DATE OF DISCHARGE:                               OPERATIVE REPORT   PREOPERATIVE DIAGNOSIS:  Hematoma in the right groin with active  bleeding.   POSTOPERATIVE DIAGNOSIS:  Hematoma in the right groin with active  bleeding.   PROCEDURES:  1. Exploration of right groin.  2. Evacuation hematoma.  3. Repair of right common femoral artery.   SURGEON:  Di Kindle. Edilia Bo, M.D.   ASSISTANT:  Jerold Coombe, P.A.   ANESTHESIA:  General.   INDICATIONS:  This is a 76 year old woman who had arteriogram earlier  today.  She had a right femoral catheterization and left brachial artery  catheterization.  She had successful PTA of a renal artery stenosis.  The patient had her sheath removed later in the day and developed sudden  onset of pain in the right groin with significant swelling.  She  initially dropped her pressure to 60 and then was resuscitated with  fluid.  She dropped her pressure again after CT scan confirmed a large  hematoma in the right rectus region.  There is no significant  retroperitoneal hematoma.  The patient having dropped her pressure twice  with significant pain was taken urgently to the operating room with  evidence of active bleeding.   TECHNIQUE:  The patient was taken to the operating room and received a  general anesthetic.  The abdomen and groins were prepped and draped in  usual sterile fashion.  A longitudinal incision was made over the groin  and dissection carried down to the superficial femoral artery which was  dissected free and controlled with a blue loop.  The deep femoral artery  was then controlled.  I then dissected up higher and the area of  bleeding was  identified.  It was quite high up under the inguinal  ligament.  I was able to control the pressure above this and then expose  the artery.  The patient was heparinized.  Clamps were placed proximally  and distally and I could then expose the hole in the artery.  This was  repaired with a 5-0 and a 6-0 Prolene suture.  The wound was then  irrigated with saline.  I tried to evacuate as much hematoma as I could  from the rectus hematoma; however, a lot of the blood had diffused  into the tissue.  A 19 Blake drain was placed in this area.  The wound  was closed with deep layer of 2-0 Vicryl.  The skin closed with a 3-0  subcuticular stitch.  Sterile dressing was applied.  The patient  tolerated the procedure well was transferred to recovery room in  satisfactory condition.  All needle and sponge counts were correct.      Di Kindle. Edilia Bo, M.D.  Electronically Signed     CSD/MEDQ  D:  10/08/2006  T:  10/09/2006  Job:  [id number]

## 2010-07-30 NOTE — Procedures (Signed)
RENAL ARTERY DUPLEX EVALUATION   INDICATION:  Right renal artery stent.  Left renal artery occlusion.   HISTORY:  Diabetes:  No.  Cardiac:  MI in 2008.  Hypertension:  Yes.  Smoking:  No.   RENAL ARTERY DUPLEX FINDINGS:  Aorta-Proximal:  51 cm/s  Aorta-Mid:  61 cm/s  Aorta-Distal:  148 cm/s  Celiac Artery Origin:  84 cm/s  SMA Origin:  440 cm/s                                    RIGHT               LEFT  Renal Artery Origin:             103/20 cm/s  Renal Artery Proximal:           83/19 cm/s  Renal Artery Mid:                62/17 cm/s  Renal Artery Distal:             31/15 cm/s  Hilar Acceleration Time (AT):  Renal-Aortic Ratio (RAR):        Not applicable secondary to abdominal  aortic aneurysm  Kidney Size:                     10.1 cm X 4.2 cm  End Diastolic Ratio (EDR):       0.30 to 0.27  Resistive Index (RI):            0.73   IMPRESSION:  1. Resistive index suggests mild parenchymal disease.  2. Kidney is normal with respect to size and shape.  3. No evidence of stent restenosis.  4. Superior mesenteric artery origin stenosis.  5. No significant change from previous study performed on 06/29/2007.   ___________________________________________  Quita Skye. Hart Rochester, M.D.   MC/MEDQ  D:  01/04/2008  T:  01/04/2008  Job:  [id number]

## 2010-07-30 NOTE — Assessment & Plan Note (Signed)
OFFICE VISIT   [name], Krista B  DOB:  22-May-1934                                       11/03/2006  ZOXWR#:[id number]   The patient underwent PTA and stenting of her right renal artery by me  on July 24th for renal insufficiency with a small non-functioning left  kidney and a severe right renal artery stenosis which we documented on  CO2 angiography. She had an excellent result from this procedure which  was unable to be done through the right femoral approach because of the  angulation of the right renal artery and the angulation of the aorta and  it required a left transbrachial approach. Unfortunately, she did  develop a rectus hematoma from her right femoral puncture site several  hours following the procedure which required exploration and repair by  Dr.  Durwin Nora. She has done very well from this and has no complaints of  discomfort in either the right inguinal area or in the left arm. She had  blood work performed earlier today, which is pending, with her baseline  creatinine being approximately 3.6 prior to the procedure. She had  multiple questions today regarding her atrophic left kidney as to  whether it should have been discovered earlier or as to whether anything  could be done about this and I answered these questions. She has a small  aneurysm which we have followed which measures only 3.4 cm in diameter  and was checked a few months ago.   PHYSICAL EXAMINATION:  Blood pressure 138/82, heart rate 64 and regular,  respirations are 18. ABDOMEN: Soft, nontender with no pulsatile mass  palpable. No bruits are audible. She has 3+ femoral pulses bilaterally  and 3+ left brachial and radial pulse with well-healed puncture sites.   We will need to watch the right renal artery closely with serial duplex  renal scans the first of which will be in three months when she returns  for further followup. Will also obtain a BMET that day to look at her  creatinine.   [name], M.D.  Electronically Signed   JDL/MEDQ  D:  11/03/2006  T:  11/04/2006  Job:  [id number]   cc:   Rollene Rotunda, MD, [facility]  [place] C. Lowell Guitar, M.D.  Delaney Meigs, M.D.

## 2010-07-30 NOTE — Assessment & Plan Note (Signed)
[facility]                            CARDIOLOGY OFFICE NOTE   NAME:[name], [name]                         MRN:          [id number]  DATE:11/03/2007                            DOB:          03-19-1934    PRIMARY CARE PHYSICIAN:  Delaney Meigs, MD.   REASON FOR PRESENTATION:  Evaluate patient with coronary artery disease.   HISTORY OF PRESENT ILLNESS:  The patient presents for followup.  She is  now 76 years old.  She has done quite well since I last saw her.  She  still works at [employer].  This is an active job.  She does not exercise  routinely.  With her daily activities, she denies any chest discomfort,  neck or arm discomfort.  She has had no palpitation, presyncope, or  syncope.  She denies any P&D or orthopnea.  She had been following Dr.  Hart Rochester for management of small abdominal aortic aneurysm and status post  stenting of her renal artery.  She has had chronic renal insufficiency  followed by Dr. Casimiro Needle.   PAST MEDICAL HISTORY:  1. Coronary artery disease (50% proximal LAD stenosis, 50% marginal      stenosis of the circumflex, right coronary artery 95% stenosis, EF      40% with hypokinesis of the inferior wall.  She had a Taxus stent      placed on April 16, 2006).  2. Hypertension.  3. Abdominal aortic aneurysm.  4. Renal artery stenosis status post stenting of the right renal.  5. She has an atrophic left renal.  6. Dyslipidemia.  7. Hypothyroidism.  8. Renal insufficiency (she reports her last creatinine 83.2).  9. Previous tobacco use.   ALLERGIES:  SULFA.   MEDICATIONS:  1. Carvedilol 12.5 mg b.i.d.  2. Plavix 75 mg daily.  3. Aspirin 325 mg daily.  4. Colace.  5. Procardia 60 mg daily.  6. Lasix 40 mg daily.  7. Synthroid 88 mcg daily.  8. Crestor 10 mg daily.   REVIEW OF SYSTEMS:  As stated in the HPI and otherwise negative for  other systems.   PHYSICAL EXAMINATION:  GENERAL:  The patient is in no  distress.  VITAL SIGNS:  Blood pressure 128/70, heart rate 59 and irregular, weight  140 pounds, body mass index 33.  HEENT:  Eyelids unremarkable.  Pupils equal, round, and reactive to  light.  Fundi are visualized.  Oral mucosa unremarkable.  NECK:  No jugular venous distension at 45 degrees, carotid upstroke  brisk and symmetrical.  No bruits.  No thyromegaly.  LYMPHATICS:  No cervical, axillary, or inguinal adenopathy.  LUNGS:  Clear to auscultation bilaterally.  BACK:  No costovertebral angle tenderness.  CHEST:  Unremarkable.  HEART:  PMI not displaced or sustained.  S1 and S2 are within normal  limits.  No S3, no S4.  No clicks, no rubs, no murmurs.  ABDOMEN:  Flat.  Positive bowel sounds, normal in frequency and pitch.  No bruits, no rebound, no guarding.  No midline pulsatile mass.  No  hepatomegaly.  No  splenomegaly.  SKIN:  No rashes, no nodules.  EXTREMITIES:  2+ pulses throughout.  No edema, no cyanosis or clubbing.  NEURO:  Oriented to person, place, and time.  Cranial nerves II through  XII are grossly intact.  Motor grossly intact.   EKG sinus bradycardia, rate 59, axis within normal limit, intervals  within normal limits.  No acute ST-T wave changes.   ASSESSMENT AND PLAN:  1. Coronary artery disease:  The patient is having no ongoing      symptoms.  No further cardiovascular testing is suggested.  She      will continue with risk reduction.  I am going to continue her on      Plavix indefinitely, as she is having no problems with this.  2. Cardiomyopathy with ejection fraction slightly reduced.  She cannot      tolerate an ACE inhibitor with her renal insufficiency.  She will      remain on the current Coreg.  She has class I symptoms.  3. Dyslipidemia:  She is followed up by Dr. Lysbeth Galas.  He recently has      changed her to Crestor.  The goal will be an LDL less than 70, an      HDL greater than 50.  4. Hypothyroidism:  Per Dr. Lysbeth Galas.  5. Hypertension:  Blood  pressure is well controlled and she will      continue the medications as listed.  6. Renal insufficiency and renal artery stenosis:  She follows with      Dr. Lowell Guitar and Dr. Hart Rochester.  7. Followup:  I will see her back in 1 year or sooner if needed.     Rollene Rotunda, MD, [facility]  Electronically Signed    JH/MedQ  DD: 11/03/2007  DT: 11/04/2007  Job #: [id number]   cc:   Delaney Meigs, M.D.

## 2010-07-30 NOTE — Procedures (Signed)
RENAL ARTERY DUPLEX EVALUATION   INDICATION:  Followup right renal artery stent placed on 10/08/2006.   HISTORY:  Diabetes:  No.  Cardiac:  MI.  Hypertension:  Yes.  Smoking:  No.   RENAL ARTERY DUPLEX FINDINGS:  Aorta-Proximal:  58 cm/s  Aorta-Mid:  46 cm/s  Aorta-Distal:  74 cm/s  Celiac Artery Origin:  114/23 cm/s  SMA Origin:  330/47 cm/s                                    RIGHT               LEFT  Renal Artery Origin:             109 cm/s  Renal Artery Proximal:           81 cm/s  Renal Artery Mid:                104 cm/s  Renal Artery Distal:             97 cm/s  Hilar Acceleration Time (AT):  Renal-Aortic Ratio (RAR):        2.4  Kidney Size:                     9.0 cm  End Diastolic Ratio (EDR):  Resistive Index (RI):            0.74   IMPRESSION:  1. Patent right renal artery stent with no hemodynamically significant      increase in Doppler velocities noted.  2. The right kidney length measurement and intrarenal resistive      indices appear within normal limits.  3. Elevated velocities of the proximal superior mesenteric artery      noted, however, they do not suggest a hemodynamically significant      stenosis.  4. There is known distal abdominal aortic aneurysm that appears to      measure 4.0 x 4.0 cm which previously measured 3.84 x 3.71 cm      during the last exam.  5. No significant change in Doppler velocities and abdominal aortic      aneurysm sac size when compared to the previous exam on 06/19/2009.       ___________________________________________  Quita Skye. Hart Rochester, M.D.   CH/MEDQ  D:  03/01/2010  T:  03/01/2010  Job:  [id number]

## 2010-07-30 NOTE — Procedures (Signed)
RENAL ARTERY DUPLEX EVALUATION   INDICATION:  Abnormal renal function.  The patient has a known atrophied  left kidney, with a large calculi.   HISTORY:  Diabetes:  No  Cardiac:  MI in January of 2008.  Hypertension:  Yes.  Smoking:  No.   RENAL ARTERY DUPLEX FINDINGS:  Aorta-Proximal:  62 cm/s  Aorta-Mid:         cm/s  Aorta-Distal:  cm/s  Celiac Artery Origin:  131/30 cm/s  SMA Origin:  313/68 cm/s (>50% stenosis)                                    RIGHT               LEFT  Renal Artery Origin:             310 cm/s            Questionable total  occlusion  Renal Artery Proximal:           255 cm/s  Renal Artery Mid:                226 cm/s  Renal Artery Distal:             124 cm/s  Hilar Acceleration Time (AT):    32 m/s2  Renal-Aortic Ratio (RAR):        5.0  Kidney Size:                     11.3 cm X 5.8 cm.   6.9 cm.  End Diastolic Ratio (EDR):       0.42 to 0.44  Resistive Index (RI):   IMPRESSION:  1. Right renal artery with a greater than 60% stenosis with an RAR of      5.0.  2. The left kidney has known atrophy with the renal artery not      visualized with suspected total occlusion.  There is a large      calculi in the upper pole (known).  3. Normal right end-diastolic ratio; however, multiple cysts are      visualized within the kidney.  4. SMA with propable >50% stenosis. CA without significant stenosis.   ___________________________________________  [name], M.D.   AR/MEDQ  D:  09/29/2006  T:  09/30/2006  Job:  [id number]   cc:   Cathleen Corti, M.D.

## 2010-07-30 NOTE — Procedures (Signed)
DUPLEX ULTRASOUND OF ABDOMINAL AORTA   INDICATION:  Follow up known abdominal aortic aneurysm.   HISTORY:  Diabetes:  No.  Cardiac:  MI in 2008.  Hypertension:  Yes.  Smoking:  No.  Connective Tissue Disorder:  Family History:  Previous Surgery:   DUPLEX EXAM:         AP (cm)                   TRANSVERSE (cm)  Proximal             2.83 cm                   2.96 cm  Mid                  3.18 cm                   3.20 cm  Distal               3.04 cm                   3.25 cm  Right Iliac          1.88 cm                   2.09 cm  Left Iliac           1.72 cm                   1.61 cm   PREVIOUS:  Date:  AP:  3.28 cm  TRANSVERSE:  3.38 cm   IMPRESSION:  1. Abdominal aortic aneurysm noted with the largest measurement of      3.18 cm X 3.20 cm.  2. Right iliac dilatation noted with measurement of 1.88 cm X 2.09 cm.  3. Study appeared to be unchanged from previous.   ___________________________________________  [name], M.D.   MG/MEDQ  D:  06/29/2007  T:  06/29/2007  Job:  [id number]

## 2010-07-30 NOTE — Assessment & Plan Note (Signed)
[facility]                            CARDIOLOGY OFFICE NOTE   [name], [name]                         MRN:          [id number]  DATE:06/14/2008                            DOB:          11-24-1934    PRIMARY CARE PHYSICIAN:  Delaney Meigs, MD   REASON FOR PRESENTATION:  Evaluate the patient with coronary artery  disease.   HISTORY OF PRESENT ILLNESS:  The patient is a pleasant 76 year old with  coronary artery disease as described below.  She presents for followup.  She has had progressive renal insufficiency and Dr. Lowell Guitar suggested  that she is getting close to the need for dialysis.  She is scheduled to  have an AV fistula placed.  She also hopes to be evaluated for renal  transplant.  She has had no new cardiovascular complaints since I last  saw her 6 months ago.  She denies any chest pressure, neck or arm  discomfort.  She has not been having any palpitations, presyncope, or  syncope.  She said she is fatigued all the time and wants to sleep.  She  describes anhedonia.  She has had no change in her appetite.  She will  occasionally have a depressed mood.  She feels anxious about the  upcoming need for dialysis.   PAST MEDICAL HISTORY:  Coronary artery disease (50% proximal LAD  stenosis, 50% marginal stenosis of the circumflex, right coronary artery  95% stenosis, EF 40% stenosis with hypokinesis of the inferior wall.  She had a Taxus stent placed in January 31st 2008), hypertension,  abdominal aortic aneurysm, renal artery stenosis status post stenting of  the right renal, atrophic left renal, dyslipidemia, hypothyroidism,  renal insufficiency, and previous tobacco use.   ALLERGIES:  SULFA.   MEDICATIONS:  1. Coreg 12.5 mg b.i.d.  2. Plavix 75 mg daily.  3. Colace 100 mg b.i.d.  4. Procardia 60 mg daily.  5. Lasix 40 mg daily.  6. Aspirin 81 mg daily.  7. Synthroid 75 mcg daily.  8. Crestor 20 mg daily.   REVIEW OF SYSTEMS:   As stated in the HPI, and otherwise negative for  other systems.   PHYSICAL EXAMINATION:  GENERAL:  The patient is in no distress.  VITAL SIGNS:  Blood pressure 108/64, heart rate 58 and regular, weight  145 pounds, and body mass index 33.  HEENT:  Eyelids unremarkable; pupils equal, round, and reactive to  light; fundi not visualized; oral mucosa unremarkable.  NECK:  No jugular venous distention at 45 degrees, carotid upstroke  brisk and symmetric, no bruits, no thyromegaly.  LYMPHATICS:  No cervical, axillary, or inguinal adenopathy.  LUNGS:  Clear to auscultation bilaterally.  BACK:  No costovertebral angle tenderness.  CHEST:  Unremarkable.  HEART:  PMI not displaced or sustained; S1 and S2 within normal limits,  no S3, no S4; no clicks, no rubs, no murmurs.  ABDOMEN:  Flat; positive bowel sounds, normal in frequency and pitch; no  bruits, no rebound, no guarding, no midline pulsatile mass, no  hepatomegaly, no splenomegaly.  SKIN:  No  rashes, no nodules.  EXTREMITIES:  Pulses 2+; no edema, no cyanosis, no clubbing.  NEURO:  Oriented to person, place, and time; cranial nerves II through  XII grossly intact; motor grossly intact.   EKG; sinus rhythm, rate 58, axis within normal limits, borderline first-  degree AV block, intervals within normal limits, no acute ST-T wave  changes.   ASSESSMENT AND PLAN:  1. Coronary artery disease.  The patient is having no symptoms related      to this.  However, if she needs renal transplant, she would need      stress perfusion imaging prior to this.  She will keep me abreast      of the plans for this.  Until then, she should continue with the      risk reduction.  2. Fatigue.  She says that her CBC and TSH were checked recently and      they were normal.  I do not see any meds that should contribute.      She does describe symptoms consistent with depression.  Perhaps, it      is time to rethink the Lexapro and I have asked her to talk  to Dr.      Lysbeth Galas about this.  3. Dyslipidemia.  Her goal should be an LDL less than 70 and HDL      greater than 50.  I will defer it to Dr. Lysbeth Galas.  4. Hypertension.  Blood pressure is well controlled and she will      continue the medicines as listed.  5. Renal insufficiency per Dr. Lowell Guitar.  6. Abdominal aortic aneurysm.  This is followed by vascular surgeons.  7. Followup.  I will see her back in 1 year or sooner if she gets      listed for a kidney.     Rollene Rotunda, MD, [facility]  Electronically Signed    JH/MedQ  DD: 06/14/2008  DT: 06/15/2008  Job #: [id number]   cc:   Mindi Slicker. Lowell Guitar, M.D.  Delaney Meigs, M.D.

## 2010-07-30 NOTE — Procedures (Signed)
RENAL ARTERY DUPLEX EVALUATION   INDICATION:  Followup right renal artery stent.   HISTORY:  Diabetes:  No.  Cardiac:  MI in 2008.  Hypertension:  Yes.  Smoking:  No.   RENAL ARTERY DUPLEX FINDINGS:  Aorta-Proximal:  87 cm/s  Aorta-Mid:  68 cm/s  Aorta-Distal:  114 cm/s  Celiac Artery Origin:  230 cm/s  SMA Origin:  390 cm/s                                    RIGHT               LEFT  Renal Artery Origin:             124 cm/s            cm/s  Renal Artery Proximal:           100 cm/s            cm/s  Renal Artery Mid:                81 cm/s             cm/s  Renal Artery Distal:             101 cm/s            cm/s  Hilar Acceleration Time (AT):    m/s2                m/s2  Renal-Aortic Ratio (RAR):        1.4  Kidney Size:                     11.24 cm            cm X cm  End Diastolic Ratio (EDR):  Resistive Index (RI):            0.69   IMPRESSION:  1. Patent right renal artery stent with no evidence of focal stenosis.  2. Possibly greater than 50% stenosis noted at the origin of the      celiac and superior mesenteric artery.  3. Abdominal aortic aneurysm noted with the largest measurement of 3.6      cm x 3.41 cm.  4. Right renal artery resistive index is within normal limits.  5. Right kidney measures within normal limits.  6. Known left kidney atrophy.   ___________________________________________  Di Kindle. Edilia Bo, M.D.   MG/MEDQ  D:  12/27/2008  T:  12/27/2008  Job:  [id number]

## 2010-07-30 NOTE — Assessment & Plan Note (Signed)
OFFICE VISIT   [name], Krista B  DOB:  07/11/1934                                       09/29/2006  ZOXWR#:[id number]   [name] returns today for continued followup regarding a small  abdominal aortic aneurysm which we have been following for the past few  years.  The size of the aneurysm has remained quite stable and she  denies any abdominal or back symptoms.  Today's duplex scan reveals it  to be 3.4 x 3.3 cm and just slightly larger than last year's scan.  She  did have a myocardial infarction in January 2008 and required PTCA and  stenting by [facility] and is followed by Dr. Antoine Poche.  She also  has been followed by Dr. Casimiro Needle for renal insufficiency and last  saw him one year ago.  Upon review of her records, she had a CT scan  performed at [facility] in 2005 which revealed an atrophic left  kidney and a normal size right kidney when the aneurysm was being  inspected.  She has some mild hip discomfort bilaterally with ambulation  but no calf claudication.   PHYSICAL EXAMINATION:  VITAL SIGNS:  Blood pressure 108/71, heart rate  61, respirations 16.  Carotid pulses are 3+ with no audible bruits.  NEUROLOGIC:  Normal.  CHEST:  Clear to auscultation.  CARDIOVASCULAR:  Regular rhythm with no murmurs.  ABDOMEN:  Soft, nontender, with no palpable masses.  She has 3+ femoral  and 2+ posterior tibial pulses palpable bilaterally.   In addition to her duplex scan of her aneurysm she underwent renal  duplex scan today which reveals evidence of greater than 60% right renal  artery stenosis proximally and a probable occlusion of her left renal  artery with an atrophic left kidney.   I discussed this situation with Dr. Lowell Guitar today.  The recent blood work  obtained by Dr. Lysbeth Galas on September 11, 2006 revealed a creatinine of 3.68  and a BUN of 36.  I feel that she does need to have renal angiogram  following the usual protective mechanism with  CO2 and a possible PTA and  stenting of the right renal artery with a small amount of contrast if  necessary in the near future.  Dr. Lowell Guitar will evaluate her first before  proceeding with this and then be in touch with me in the near future.  I  discussed this all with [name] today and she understands.  She is on  Plavix and aspirin which she will continue.   [name], M.D.  Electronically Signed   JDL/MEDQ  D:  09/29/2006  T:  09/30/2006  Job:  [id number]   cc:   Mindi Slicker. Lowell Guitar, M.D.  Delaney Meigs, M.D.

## 2010-07-30 NOTE — Op Note (Signed)
[name], [name]                  ACCOUNT NO.:  [id number]   MEDICAL RECORD NO.:  [id number]          PATIENT TYPE:  OBV   LOCATION:  3314                         FACILITY:  [facility]   PHYSICIAN:  Quita Skye. Hart Rochester, M.D.  DATE OF BIRTH:  1934-12-15   DATE OF PROCEDURE:  10/08/2006  DATE OF DISCHARGE:                               OPERATIVE REPORT   PREOPERATIVE DIAGNOSIS:  Renal insufficiency with atrophic left kidney  and severe right renal artery stenosis.   POSTOPERATIVE DIAGNOSIS:  Renal insufficiency with atrophic left kidney  and severe right renal artery stenosis.   PROCEDURE:  1. Abdominal aortogram using carbon dioxide via right common femoral      approach.  2. Right renal angiogram via left brachial artery approach.  3. Percutaneous transluminal angioplasty and stenting of right renal      artery using a 5 x 12 mm Genesis on Aviator with first inflation of      10 atmospheres for 42 seconds, with second inflation of 10      atmospheres for 22 seconds, with completion angiogram.   SURGEON:  Quita Skye. Hart Rochester, M.D.   ANESTHESIA:  Local Xylocaine and Versed 1 mg intravenously.   CONTRAST:  35 mL.   ADDITIONAL MEDICATIONS:  Heparin 4000 units also in the left brachial  sheath, 2000 units of heparin and 200 mcg of nitroglycerin   COMPLICATIONS:  None.   DESCRIPTION OF PROCEDURE:  The patient was taken to [facility]  [facility] and placed in supine position, at which time  both groins were prepped with Betadine solution and draped in a routine  sterile manner.  After infiltration of 1% Xylocaine, the right common  femoral artery was entered percutaneously.  A guidewire was passed into  the suprarenal aorta under fluoroscopic guidance.  A 6-French sheath and  dilator were passed over the guidewire, dilator removed and standard  pigtail catheter positioned in the suprarenal aorta.  Aortogram was then  performed using carbon dioxide and repeated on 2 occasions  with RAO  projection at 10 degrees revealing no flow in the left renal artery and  what appeared to be a very severe stenosis at the origin of the right  renal artery and a diminished size of the right kidney.  Attempts were  made to cannulate the right renal artery with a SOS catheter and this  was unsuccessful and because of the angulation, it was decided that this  would have to be done through the upper extremity.  Therefore, the left  arm was prepped with Betadine scrubbing solution and draped in a routine  sterile manner.  The left brachial artery was entered and 6-French  sheath inserted in the left brachial artery over a guidewire.  Two  thousands units of heparin and 200 mcg of nitroglycerin were inserted  through the sheath.  Guidewire was then advanced into the descending  thoracic aorta and down into the abdominal aorta in the perirenal area.  A 6-French multipurpose 1 guide catheter was then advanced down to the  origin of the right renal artery and  then a Headhunter diagnostic  catheter was utilized through the guide catheter to cannulate the right  renal artery and a stabilizer wire advanced into the distal right renal  artery.  A right renal angiogram was performed, which revealed a severe  stenosis at the origin of the right renal artery about 1 cm in length.  Four thousand units of heparin were given intravenously and using a 5 x  12-mm Genesis on Aviator system, the right renal artery lesion was  dilated and stented primarily with an excellent cosmetic result.  Repeat  angiogram revealed the stent to be in excellent position with complete  resolution of the stenosis.  The first inflation was 10 atmospheres for  42 seconds.  A second inflation was made at the origin of the stent to  flare it in the aorta and this was done at 10 atmospheres for 22  seconds.  Following this, the angioplasty catheter and the guide  catheter were removed and following correction of the ACT,  both sheaths  were removed, adequate compression applied and no complications ensued.   FINDINGS:  1. Ninety percent focal stenosis, origin of right renal artery, with      diminished size of right kidney.  2. No evidence of flow in left renal artery.  3. Successful PTA and stenting of right renal artery using a 5 x 12      Genesis on Aviator with 2 inflations, 1 at 10 atmospheres for 42      seconds, the second inflation of 10 atmospheres for 22 seconds with      good cosmetic result.      [name], M.D.  Electronically Signed     JDL/MEDQ  D:  10/08/2006  T:  10/09/2006  Job:  [id number]

## 2010-07-30 NOTE — Procedures (Signed)
CEPHALIC VEIN MAPPING   INDICATION:  Preop evaluation of cephalic vein for dialysis access site.   HISTORY:   EXAM:   The right cephalic vein is compressible.   Diameter measurements range from 0.58 to 0.23 cm.   The left cephalic vein is compressible.   Diameter measurements range from 0.60 to 0.14 cm.   See attached worksheet for all measurements.   IMPRESSION:  Patent bilateral cephalic veins which are of acceptable  diameter for use as a dialysis access site.   ___________________________________________  Di Kindle. Edilia Bo, M.D.   AC/MEDQ  D:  06/20/2008  T:  06/20/2008  Job:  [id number]

## 2010-07-30 NOTE — Procedures (Signed)
RENAL ARTERY DUPLEX EVALUATION   INDICATION:  Right renal artery stent.   HISTORY:  Diabetes:  No.  Cardiac:  MI in 2008.  Hypertension:  Yes.  Smoking:  No.   RENAL ARTERY DUPLEX FINDINGS:  Aorta-Proximal:  56 cm/s  Aorta-Mid:  76 cm/s  Aorta-Distal:  161 cm/s  Celiac Artery Origin:  166 cm/s  SMA Origin:  355 cm/s                                    RIGHT               LEFT  Renal Artery Origin:             82 cm/s  Renal Artery Proximal:           126 cm/s  Renal Artery Mid:                146 cm/s  Renal Artery Distal:             113 cm/s  Hilar Acceleration Time (AT):  Renal-Aortic Ratio (RAR):        2.6  Kidney Size:                     10.4. cm  End Diastolic Ratio (EDR):  Resistive Index (RI):            0.66   IMPRESSION:  1. Normal right kidney noted with respect to size and shape.  2. No evidence of stent restenosis.  3. Elevated velocity of 355 cm/s noted at the origin of the superior      mesenteric artery.   ___________________________________________  Di Kindle. Edilia Bo, M.D.   AC/MEDQ  D:  06/20/2008  T:  06/20/2008  Job:  [id number]

## 2010-07-30 NOTE — Discharge Summary (Signed)
NAMEMARIEL, [name]                  ACCOUNT NO.:  [id number]   MEDICAL RECORD NO.:  [id number]          PATIENT TYPE:  INP   LOCATION:  2008                         FACILITY:  [facility]   PHYSICIAN:  Quita Skye. Hart Rochester, M.D.  DATE OF BIRTH:  02/14/1935   DATE OF ADMISSION:  10/08/2006  DATE OF DISCHARGE:                               DISCHARGE SUMMARY   ANTICIPATED DATE OF DISCHARGE:  July 26 or October 11, 2006.   ADMISSION DIAGNOSES:  Renal insufficiency with atrophic left kidney and  severe right renal artery stenosis.   DISCHARGE/SECONDARY DIAGNOSES:  1. Renal insufficiency with atrophic left kidney and severe right      renal artery stenosis, status post right renal artery PTA and      stenting.  2. Post-procedure hematoma in the right groin with active bleeding      requiring exploration, evacuation of hematoma, and repair of right      common femoral artery.  3. History of cholecystectomy.  4. History of tonsillectomy.  5. Hysterectomy in the 1980s.  6. Hyperlipidemia.  7. Hypothyroidism.  8. Small abdominal aortic aneurysm measuring 3 x 4 x 3.3 cm from July      2008, is followed by Dr. Hart Rochester.  9. Coronary artery disease with history of myocardial infarction in      January 2008, requiring PTCA and stenting by Dr. Antoine Poche.   PROCEDURES:  1. Abdominal aortogram using carbon dioxide via right common femoral      approach, right renal angiogram via left brachial artery approach,      percutaneous transluminal angioplasty and stenting of right renal      artery using a 5 x 12-mm genesis on aviator, with first inflation      of 10 atmospheres for 42 seconds, with second inflation 10      atmospheres for 22 seconds with completion arteriogram by Dr.      Hart Rochester on October 08, 2006.  2. Exploration of right groin, evacuation of hematoma, repair of right      common femoral artery by Dr. Waverly Ferrari on October 08, 2006.   BRIEF HISTORY:  [name] is a 76 year old Caucasian  female, who Dr.  Hart Rochester has been following for a small abdominal aortic aneurysm.  She is  seen by Dr. Antoine Poche for coronary artery disease, as previously  mentioned and by Dr. Casimiro Needle for her renal insufficiency.  Apparently, she had a CT scan at [facility] in 2005, which  revealed an atrophic left kidney and a small size right kidney, when the  aneurysm was being inspected.  She had some mild hip discomfort  bilaterally with ambulation, but no calf claudication.  She had a recent  duplex scan of her aneurysm, as well as a renal duplex scan on September 29, 2006, which revealed evidence of greater than 60% right renal artery  stenosis proximally, and probably occlusion of her left renal artery  with an atrophic left kidney.  Her aneurysm size remained stable, as  mentioned previously.  Recent blood work at Dr. Joyce Copa office in June,  revealed a creatinine 3.68 and BUN of 36.  After discussion with Dr.  Lowell Guitar, Dr. Hart Rochester felt that she would need to have a renal angiogram,  following the usual protective mechanism with CO2 and possible PTA and  stenting of her right renal artery, with a small amount of contrast as  necessary in the future.  She was continued on Plavix and aspirin, in  anticipation for the procedure.   ALLERGIES:  TO SULFA.   HOSPITAL COURSE:  [name] was admitted to [facility] October 08, 2006 and initially underwent arteriogram with renal artery stenting, PTA  and stenting.  As mentioned, the left brachial artery approach was  indicated.  Initial plan was for her to be admitted overnight, with  plans to be discharged on the following day; however, almost immediately  after pulling the femoral sheath, while still in the cath lab recovery  unit, she developed acute onset of pain.  Swelling was noted as well.  She also had a transit drop in her pressure to the 80s, which was  initially treated with fluid.  She underwent a CT scan of the abdomen  and  pelvis, and this showed complex high-density fluid collection  anterior to the right rectus sheath and extending superiorly from the  level of the right groin, measuring 11.1 x 4.7 x 12.8 cm.  The bladder  was decompressed and with retained contrast.  The uterus and ovaries  were not visualized.  The bowel was grossly unremarkable.  The anterior  abdominal wall high-density fluid collection was felt consistent with  hematoma, extending superiorly from the right groin catheterization  site.  After these results, it was felt that [name] should be taken to  the operating room urgently, as she had continued pain, and pressures  had dropped transiently as low as in the 60s, systolically.  Dr.  Waverly Ferrari, on-call vascular surgeon, made the decision to take  her to the operating room.  The patient and family were in agreement.  Of note, she did receive 2 units of packed red blood cells in the  operating room for acute blood loss anemia.  Intraoperatively, a  hematoma was evacuated, and her femoral artery was repaired.  A Al Pimple drain was placed intraoperatively.  She was extubated  neurologically intact and transferred to step-down unit 3,300, where she  remained for the first 24 hours.  On postoperative day #1, hemoglobin  and hematocrit were increasing, now 9.4 and 27.7.  Vitals were also  stable.  Creatinine has showed some improvement, now down to 3.09.  Her  drain had approximately 50 mL, since surgery.  It was felt that it could  be discontinued on July 26, as long as drainage can continue to  decrease.  There showed no re-accumulation of hematoma.  Her left upper  extremity showed no evidence of hematoma.  She had palpable pulses of  the left radial and right posterior tibial arteries.  She did report  some lower quadrant abdominal pain, particularly over her right lower  quadrant, which she fell was most likely related to gas pains and  simethicone was ordered;  however, is also felt that she has experienced  residual pain from her second from her hematoma and surgery.  Her  abdominal exam otherwise was benign.  By the afternoon of postoperative  day #1, it was written for her Foley catheter and arterial line to be  discontinued, that she begin to mobilize and felt appropriate  to  transfer to telemetry unit 2000, where it was anticipated she would  remain until discharge.  Post followup, CBC and BMET been ordered the  time of discharge, pending at the time of this dictation.  Anticipate  that if her labs were stable and there are no significant changes in her  status, that she will be discharged home on postoperative day 2, October 09, 2005.  This also pending that she is making good progress with  mobilization, secondary to her soreness from her groin incision site.   DISCHARGE/SECONDARY DIAGNOSES:  Acute blood loss anemia secondary to  hematoma, improved after transfusion.   DISCHARGE MEDICATIONS:  1. Coreg 12.5 mg p.o. b.i.d.  2. Plavix 75 mg p.o. daily.  3. Aspiring 325 mg p.o. daily.  4. Furosemide 40 mg p.o. daily.  5. Nifedipine ER 60 mg p.o. daily.  6. Synthroid 88 mcg daily.  7. Lipitor 40 mg daily.  8. Tylox 1 tablet p.o. q.4 h. p.r.n. pain.   DISCHARGE INSTRUCTIONS:  She should continue a heart-healthy diet,  increase activity slowly.  She may shower and clean her incision gently  with soap and water.  She is to avoid driving or heavy lifting for the  next 2 weeks.  She should call if she develops redness, drainage or  swelling from her cath puncture sites, or from her right groin incision,  or if she develops fever greater than 101.   RECENT LABORATORY DATA:  Labs on July 25 showed a white blood count of  5.4, hemoglobin 9.4, hematocrit 27.7, platelet count 153, sodium 136,  potassium 3.9, chloride 108, CO2 24, BUN 20, creatinine 2.09, blood  glucose 90.  LFTs were normal preoperatively; however, alkaline  phosphatase was  slightly elevated at 122.  Her albumin was 4.0.   FOLLOW UP:  She is to see Dr. Hart Rochester at the vascular and vein specialist  office in approximately 4 weeks.  She has been instructed to go by  [facility] inside the [facility] for lab work, one  hour before her appointment with Dr. Hart Rochester.  Our office will contact  her regarding specific appointment date and time.      Jerold Coombe, P.A.      [name], M.D.  Electronically Signed    AWZ/MEDQ  D:  10/09/2006  T:  10/10/2006  Job:  [id number]   cc:   Patient Hospital Chart  Quita Skye. Hart Rochester, M.D.  Mindi Slicker. Lowell Guitar, M.D.  Rollene Rotunda, MD, [facility]  Delaney Meigs, M.D.

## 2010-08-02 NOTE — H&P (Signed)
[name], [name]                  ACCOUNT NO.:  [id number]   MEDICAL RECORD NO.:  [id number]          PATIENT TYPE:  INP   LOCATION:  6529                         FACILITY:  [facility]   PHYSICIAN:  Krista Farber, MD  DATE OF BIRTH:  1935/02/02   DATE OF ADMISSION:  04/16/2006  DATE OF DISCHARGE:                              HISTORY & PHYSICAL   HISTORY OF PRESENT ILLNESS:  This is a 76 year old Caucasian female who  lives in [place], who felt heaviness and burning feeling substernally  yesterday evening around 6 o'clock.  Krista patient states that she felt,  what she describes as, fire all over her chest with radiation to Krista  left arm.  She denied any associated shortness of breath, nausea,  vomiting or diaphoresis at that time.  She took Tums, she took Rolaids,  took Pepto-Bismol and a acid reducer with no relief.  Her husband came  home later that evening and she continued to have symptoms.  Krista patient  was brought to [name] by her husband.  On her way to  [facility]rista emergency room, Krista patient continued to have chest pain and began  to have some nausea and vomiting.   On arrival to [name], Krista patient was seen and  assessed by Krista emergency room physician, was given nitroglycerin and  aspirin and Morphine and pain was relieved.  Krista patient was started on  IV fluids, normal saline and placed a nitroglycerin patch to her chest  wall.   Krista patient's initial troponin was found to be negative at 0.08.  Subsequent troponin, at 3 a.m., was 0.176.  Secondary to this elevated  troponin and patient's symptoms with cardiovascular risk factors, Dr.  Oneida [name], fellow for [name] was contacted and accepted  patient on transfer to [name].   Krista patient was seen and examined by myself and Dr. Samule [name].  Krista patient  is currently pain free.  Continues on oxygen, heparin and nitroglycerin.  Krista patient's potassium was found to be 3.1 and this was  repleted with  40 mEq of K-Dur this a.m.   PAST MEDICAL HISTORY:  Krista patient has a prior history of:  1. Nephrolithiasis.  2. Hypertension.  3. Thyroid disease.  4. Hypercholesterolemia.  5. A history of an abdominal aortic aneurysm, which was diagnosed 3      years ago.   PAST SURGICAL HISTORY:  1. Hysterectomy.  2. Carpal tunnel repair.   SOCIAL HISTORY:  Krista patient smokes 3-4 cigarettes a day and has been  doing so x5 years.  Negative for ETOH.  She works at a [employer]  company in [place].   FAMILY HISTORY:  Father died of a myocardial infarction at age 74.  Brother died of a myocardial infarction at age 20.  Mother died of  cancer.  She has a sister with renal artery stenosis and a stent to that  artery, along with peripheral vascular disease.   CURRENT LABS:  Troponin 0.01, which is number 1 at 1:30 a.m., troponin  number 2 at 3 a.m. at 1.73.  CPK original 136 with a CK-MB of 3.6.  CPK  at 3 a.m. at 168 with a CK-MB of 13.8.  PT 10.7, INR 0.9, PTT 25.8.  Sodium 138, potassium 3.1, chloride 109, CO2 24, BUN 14, creatinine 1.4,  glucose 105.  Hemoglobin 13.3, hematocrit 37.9, white blood cell is 9.8,  platelets 277.   VITAL SIGNS:  Blood pressure 122/70.  Heart rate 64.  Respirations 12.  Temperature 98.2.  O2 saturation 98% on 2 liters.   PHYSICAL EXAMINATION:  GENERAL:  She is awake, alert and oriented.  Pain  free at present.  HEENT:  Head is normocephalic and atraumatic.  Eyes:  PERRLA.  Mucous  membranes and mouth are pink and moist.  Tongue is midline.  NECK:  Supple.  There is no JVD.  There are no carotid bruits  appreciated.  No thyromegaly is noted.  CARDIOVASCULAR:  Regular rate and rhythm without murmurs, rubs or  gallops.  Radial pulses 1+ bilaterally.  Dorsalis pedis pulses 1+  bilaterally.  LUNGS:  Essentially clear to auscultation with crackles noted in Krista  left lower lobe, which are cleared with cough.  ABDOMEN:  Soft, nontender, 2+ bowel  sounds.  No abdominal bruits are  appreciated.  EXTREMITIES:  Without clubbing, cyanosis or edema.  SKIN:  Warm and dry.  NEURO:  Cranial nerves II-XII are intact with no neuro deficits noted.   MEDICATIONS AT HOME:  Krista patient takes:  1. Synthroid 75 mcg once a day.  2. Procardia XL 30 mg once a day.  3. Furosemide 20 mg once a day.  4. Aspirin 81 mg once a day.   SHE IS ALLERGIC TO SULFA.   EKG reveals no acute ST/T-wave changes without Q-waves noted as well.  Repeat EKG is pending.   IMPRESSION:  1. Non-ST elevated myocardial infarction with positive cardiac      enzymes.  2. History of abdominal aortic aneurysm.  3. History of hypertension.  4. Thyroid disease.  5. History of hypercholesterolemia.  6. Tobacco abuse.   CARDIOVASCULAR RISK FACTORS:  Family history, hypertension,  hypercholesterolemia, tobacco abuse.   PLAN:  This is a 76 year old Caucasian female who was admitted as a  transfer from [name] secondary to chest heaviness with  radiation to Krista left arm, associated nausea with positive cardiac  enzymes.  Krista patient will be scheduled for cardiac catheterization this  a.m.  We will continue heparin, nitroglycerin and aspirin.  We will  start  her on Lopressor 25 mg p.o. b.i.d.  Krista patient will continue on oxygen.  Krista patient is pain free at present.  We will continue to monitor  cardiac enzymes and make further recommendations after cardiac  catheterization is completed for need for intervention.      Krista Mare. Lyman Bishop, NP      Krista Farber, MD  Electronically Signed    KML/MEDQ  D:  04/16/2006  T:  04/16/2006  Job:  [id number]   cc:   [name], M.D.

## 2010-08-02 NOTE — Discharge Summary (Signed)
[name], [name]                  ACCOUNT NO.:  [id number]   MEDICAL RECORD NO.:  [id number]          PATIENT TYPE:  INP   LOCATION:  3737                         FACILITY:  [facility]   PHYSICIAN:  Rollene Rotunda, MD, FACCDATE OF BIRTH:  10-May-1934   DATE OF ADMISSION:  04/16/2006  DATE OF DISCHARGE:  04/18/2006                               DISCHARGE SUMMARY   PRIMARY CARDIOLOGIST:  Dr.  Caprice Red.   PRIMARY CARE PHYSICIAN:  Dr. Joette Catching in [place].   PRINCIPAL DIAGNOSIS:  Non ST elevation myocardial infarction/coronary  artery disease.   SECONDARY DIAGNOSES:  1. Hypertension.  2. Hyperlipidemia.  3. Hypothyroidism.  4. History of nephrolithiasis.  5. History of abdominal aortic aneurysm diagnosed approximately three      years ago.  6. Status post hysterectomy.  7. Carpal tunnel repair.  8. Ischemic cardiomyopathy with ejection fraction of 40% with inferior      hypokinesis by catheterization.  9. Tobacco abuse.   ALLERGIES:  SULFA.   PROCEDURES:  Left heart cardiac catheterization with successful PCI and  stenting of the mid RCA with placement of a 3 x 24 mm Taxus drug-eluting  stent.   HISTORY OF PRESENT ILLNESS:  A 76 year old Caucasian female with no  prior history of coronary artery disease who was in her usual state of  health until the evening of April 15, 2006, when at approximately 6  p.m. she developed substernal chest heaviness and burning, with  radiation to the left arm, without associated symptoms.  She took  several GI concoction including Tums, Rolaids, and Pepto-Bismol, without  relief.  When her husband came home, he drove her to the [facility]  Emergency Room where she continued to have chest pain as well as some  nausea and vomiting.  She was treated with sublingual nitroglycerin and  aspirin as well as morphine with relief of discomfort.  Her troponin was  elevated at 0.08 and she was transferred to [facility] for further  evaluation.   HOSPITAL COURSE:  Patient ruled in for non ST elevation MI and peaked  her CK at 413, MB at 47.3, and troponin I at 4.85.  She underwent left  heart cardiac catheterization on January 31st, revealing 95% stenosis in  the proximal RCA with 50% stenoses in the proximal LAD and mid left  circumflex.  The RCA was successfully stented with a 3 x 24 mm Taxus  drug-eluting stent.  She tolerated the procedure well, and post  procedure was counseled on the importance of smoking cessation.  Notably, her EF by left ventriculography was 40% with inferoapical  hypokinesis.  She was initiated on beta blocker and ACE inhibitor  therapy and maintained on oral Lasix as she was on at home.  She  remained euvolemic and was counseled on the importance of sodium  restriction as well daily weight.  She has not had any recurrent chest  pain, has been ambulating with difficulty, and being discharged home  today in satisfactory condition.   DISCHARGE LABORATORIES:  Hemoglobin 12.1, hematocrit 35.7, WBC 8.4,  platelets 255, MCV 89.1.  Sodium  137, potassium 3.7, chloride 102, CO2  28, BUN 12, creatinine 1.65, glucose 91.  PT 13.5, INR 1, PTT 144.  CK  413, MB 47.3, troponin I 4.85.  Calcium 8.6.   DISPOSITION:  Patient is being discharged home today in good condition.   FOLLOWUP PLANS AND APPOINTMENTS:  We will arrange for her to follow up  with Dr. Caprice Red in [place] in approximately two weeks.  She will be  contacted by our office.  She was asked to follow up with her primary  care physician, Dr. Lysbeth Galas, as previously scheduled.   DISCHARGE MEDICATIONS:  1. Aspirin 325 mg daily.  2. Plavix 75 mg daily.  3. Coreg 12.5 mg b.i.d.  4. Lisinopril 10 mg daily.  5. Lasix 20 mg daily.  6. Synthroid 75 mcg daily.  7. Nitroglycerin 0.4 mg sublingual p.r.n. chest pain.   OUTSTANDING LAB STUDIES:  None.   DURATION OF DISCHARGE ENCOUNTER:  Fifty (50) minutes including physician  time.      [name],  ANP      Rollene Rotunda, MD, [facility]  Electronically Signed    CB/MEDQ  D:  04/18/2006  T:  04/18/2006  Job:  [id number]   cc:   Delaney Meigs, M.D.

## 2010-08-02 NOTE — Cardiovascular Report (Signed)
NAMEVEGAS, COFFIN                  ACCOUNT NO.:  [id number]   MEDICAL RECORD NO.:  [id number]          PATIENT TYPE:  INP   LOCATION:  6529                         FACILITY:  [facility]   PHYSICIAN:  Bruce R. Juanda Chance, MD, FACCDATE OF BIRTH:  01/20/1935   DATE OF PROCEDURE:  04/16/2006  DATE OF DISCHARGE:                            CARDIAC CATHETERIZATION   CLINICAL HISTORY:  Ms. [name] is 76 years old and has a history of an  abdominal aortic aneurysm, hyperlipidemia, hypertension, cigarette use  and a positive family history of coronary heart disease.  She developed  severe chest pain and was brought to [facility]  Her enzymes  were positive consistent with a non-ST-elevation myocardial infarction.  Her EKG was normal.  She was transferred here for further evaluation.   PROCEDURE:  The procedure was performed via the femoral using arterial  sheath and 6-French preformed coronary catheters.  A front wall arterial  puncture was performed and Omnipaque contrast was used.  After we  completed the diagnostic study, we made a decision to proceed with  intervention on the right coronary artery.   The patient was given Angiomax bolus and infusion.  We used a 6-French  JR-4 guiding catheter with side holes and a Prowater wire.  We crossed  the tight lesion in the mid right coronary artery with the wire without  difficulty.  We predilated with a 2.5 x 20 mm Maverick balloon  performing one inflation to 8 atmospheres for 30 seconds.  We then  deployed a 3.0 x 24 mm Taxus stent deploying this with one inflation of  15 atmospheres for 30 seconds.  We then postdilated with a 3.5 x 20 mm  Quantum Maverick performing two inflations up to 16 atmospheres for 30  seconds.  We attempted to pass an IVUS cath but had difficulty passing  the catheter around the bend and dislodged the guiding catheter and had  to re-cross with the wire.  We elected not to proceed with IVUS but  instead dilated with a 4.0  x 20 mm Quantum Maverick.  We performed two  inflations up to 16 atmospheres for 30 seconds.  We had difficulty  dilating the proximal edge of the stent because the balloon migrated  distally with the inflations.  For this reason, we went back in with a  3.5 x 15 mm Quantum Maverick and dilated the proximal edge of the stent  with the balloon outside the edge of the stent.  We went up to 16  atmospheres for 20 seconds.  Final diagnostic study was then performed  through the guiding catheter.  The patient tolerated the procedure well  and left the laboratory in satisfactory condition.   RESULTS:  Left main coronary artery is free of significant disease.   Left anterior descending artery gave rise to three diagonal branches and  four septal perforators.  There was 50% narrowing in the proximal LAD.  There was total occlusion of the second diagonal branch.   The circumflex artery was a small vessel that gave rise to an atrial  branch and a  marginal branch.  There was 50% narrowing in the marginal  branch.   The right coronary artery was a very dominant vessel and very large  vessel that gave rise to a right ventricular branch, a posterior  descending branch and two large posterolateral branches.  There was a  long segmental area of 95% stenosis in the mid right coronary artery  with an aneurysmal portion at the distal part of the lesion.   The left ventriculogram performed in the RAO projection showed  hypokinesis of the inferior wall and tip of the apex.  The estimated  fraction was 40%.   Following stenting of the lesion in the mid right coronary, the stenosis  improved from 95% to 0%.  Flow remained TIMI III before and after  intervention.   CONCLUSION:  1. Coronary artery disease with recent non-ST-elevation myocardial      infarction with 50% narrowing in the proximal left anterior      descending, total occlusion of second diagonal branch of the left      anterior descending,  50% narrowing in the marginal branch of the      circumflex artery, 95% stenosis in the mid right coronary artery      and inferior wall and apical wall akinesis with an estimated      ejection fraction of 40%.  2. Successful percutaneous coronary intervention of the lesion in the      mid right coronary using a Taxus drug-eluting stent with      improvement in % narrowing from 95% to 0%.   DISPOSITION:  The patient returned __________ for further observation.      Bruce Elvera Lennox Juanda Chance, MD, [facility]  Electronically Signed     BRB/MEDQ  D:  04/16/2006  T:  04/16/2006  Job:  [id number]   cc:   Delaney Meigs, M.D.  Salvadore Farber, MD

## 2010-08-02 NOTE — Discharge Summary (Signed)
[name], [name]                  ACCOUNT NO.:  [id number]   MEDICAL RECORD NO.:  [id number]          PATIENT TYPE:  INP   LOCATION:  3737                         FACILITY:  [facility]   PHYSICIAN:  Bevelyn Buckles. Bensimhon, MDDATE OF BIRTH:  1935-02-27   DATE OF ADMISSION:  04/16/2006  DATE OF DISCHARGE:  04/19/2006                               DISCHARGE SUMMARY   ADDENDUM:  Previous job number is [id number].  Please see previous dictation for  complete details of HPI and hospital course.   ADDENDUM HOSPITAL COURSE:  A repeat basic metabolic panel yesterday  morning came back revealing elevated creatinine at 2.29 up from 1.65 on  February 1.  We therefore decided to keep the patient in the hospital to  follow her creatinine this morning.  Today, her creatinine is somewhat  less at 2.13 with a BUN of 23.  She has had no additional chest pain or  shortness of breath.  This morning, she had a bowel movement, her first  in about four days and noted bright red blood on her  toilet paper.  She  did not notice if there was any blood in the toilet.  At further  questioning, she does report a history of hemorrhoids as well as a  history of colon polyps by colonoscopy approximately four years ago  performed at [facility].  She was due for a colonoscopy within the next  year or so.  Because she will require Plavix therapy for about a year,  we contacted gastroenterology, and she was seen by Dr. Christella Hartigan who felt  that bleed was likely secondary to internal hemorrhoids and recommended  local hemorrhoid care with [facility] as well as stool softener and  p.r.n. followup with Dr. Arlyce Dice if bleeding worsens.  GI did not feel  that there was any need to offer her antiplatelet therapy.  She is  therefore being discharged home today in satisfactory condition.   DISCHARGE LABORATORY DATA:  Hemoglobin 10.8, hematocrit 31.2, WBC 8.9,  platelets 202.  Sodium 139, potassium 3.6, chloride 108, CO2 25, BUN 23,  creatinine  2.13, glucose 104, troponin-I 1.51.  See previous dictation  for additional laboratory data.   FOLLOWUP PLANS AND APPOINTMENTS:  Again, she will follow up with Dr.  Antoine Poche in about two weeks, and she will be contacted by our office.  She has an appointment with Dr. Lysbeth Galas, her primary care physician in  [place], as previously scheduled.   DISCHARGE MEDICATIONS:  1. Aspirin 325 mg daily.  2. Plavix 75 mg daily.  3. Coreg 12.5 mg b.i.d.  4. Lisinopril 10 mg daily.  5. Lasix 20 mg daily.  6. Synthroid 75 mcg daily.  7. Nitroglycerin 0.4 mg sublingual p.r.n. chest pain.  8. Colace 100 mg b.i.d.  9. Anusol-HC suppository 1 t.i.d. until bleeding subsides.  10.GlycoLax 1 cap daily mixed with water or juice p.r.n.   OUTSTANDING LABORATORY STUDIES:  None.   DURATION OF DISCHARGE ENCOUNTER:  Sixty minutes including physician  time.      [name], ANP      Bevelyn Buckles. Bensimhon, MD  Electronically Signed    CB/MEDQ  D:  04/19/2006  T:  04/19/2006  Job:  [id number]   cc:   Barbette Hair. Arlyce Dice, MD,FACG  Delaney Meigs, M.D.

## 2010-08-02 NOTE — Assessment & Plan Note (Signed)
[facility]                            CARDIOLOGY OFFICE NOTE   Krista, [name]                         MRN:          [id number]  DATE:05/20/2006                            DOB:          1934/06/02    REFERRING PHYSICIAN:  Delaney Meigs, M.D.   REASON FOR PRESENTATION:  Evaluate patient with acute inferior  myocardial infarction.   HISTORY OF PRESENT ILLNESS:  Patient returns for followup.  I saw her in  late February with the idea of titrating her meds by adding an ACE  inhibitor.  However, I wanted to run this by Dr. Lowell Guitar first, as she  was seeing him for renal insufficiency.  He suggested to her not to  start this, and actually restarted Procardia 60 mg daily.   She has had no new symptoms.  She has had some fleeting chest pain.  She  seems quite anxious and depressed, adenopathy I have asked her to talk  to Dr. Lysbeth Galas about this.  She did see Dr. Lysbeth Galas, and had her Lipitor  adjusted recently.  She is about to start cardiac rehab.  She denies any  new shortness of breath.  Has had no PND or orthopnea.  She has had no  palpitations, presyncope, syncope.   PAST MEDICAL HISTORY:  1. Coronary artery disease (50% proximal LAD stenosis, 50% marginal      stenosis off the circumflex, right coronary artery 95% stenosis, EF      40% with hypokinesis of the inferior wall.  She had a Taxus stent.      This was April 16, 2006).  2. Hypertension.  3. Abdominal aortic aneurysm followed by Dr. Colin Benton.  4. Dyslipidemia.  5. Hypothyroidism.  6. Renal insufficiency.  7. Previous tobacco use.   ALLERGIES:  SULFA.   MEDICATIONS:  1. Coreg 12.5 mg b.i.d.  2. Plavix 75 mg daily.  3. Synthroid 75 mcg daily.  4. Lipitor 20 mg daily.  5. Aspirin 325 mg daily.  6. Lasix 20 mg daily.  7. Colace 100 mg b.i.d.  8. Procardia 60 mg daily.   REVIEW OF SYSTEMS:  As stated in the HPI, and otherwise negative for  other systems.   PHYSICAL EXAMINATION:   Patient is in no distress.  Blood pressure 148/86.  Heart rate 50 and regular.  NECK:  No jugular venous distention at 45 degrees.  Carotid upstroke  brisk and symmetric.  No bruits.  No thyromegaly.  LYMPHATICS:  No adenopathy.  LUNGS:  Clear to auscultation bilaterally.  BACK:  No costovertebral angle tenderness.  CHEST:  Normal.  HEART:  PMI not displaced or sustained.  S1 and S2 within normal limits.  No S3, no S4, no clicks, no rubs, no murmurs.  ABDOMEN:  Flat.  Positive bowel sounds.  Normal in frequency and pitch.  No bruits.  No rebound.  No guarding.  No midline pulsatile mass.  No  organomegaly.  SKIN:  No rashes.  No nodules.  EXTREMITIES:  Two pulse pulses.  Good mobility.  No cyanosis.  No  clubbing.  NEUROLOGIC:  Grossly intact.   EKG:  Sinus rhythm.  Rate 61.  Axis within normal limits.  Intervals  within normal limits.  Poor anterior R wave progression.  No acute ST or  T wave changes.   ASSESSMENT AND PLAN:  1. Coronary disease.  Patient is having no new symptoms.  At this      point, I am not going to add an ACE inhibitor per Dr. Roanna Banning      suggestion.  I cannot titrate her Coreg because of her brady      arrhythmia.  Therefore, she will remain on the medications as      listed.  We will continue to practice secondary risk reduction.      She is going to participate in cardiac rehab.  2. Hypertension.  Her blood pressure is very slightly elevated today.      However, she was just started on the Procardia, and we will watch      this, and it can be up titrated as tolerated and as needed.  3. Cardiomyopathy.  We will follow this in the months to come with a      repeat echo since she is status post revascularization, and not      treated with ACE inhibitors.  4. Followup.  I am going to see her back in about 3 months, or sooner      if needed.     Rollene Rotunda, MD, [facility]  Electronically Signed    JH/MedQ  DD: 05/20/2006  DT: 05/20/2006  Job #:  [id number]   cc:   Alfredia Client, MD

## 2010-08-02 NOTE — Op Note (Signed)
NAMEMEGHEN, AKOPYAN                            ACCOUNT NO.:  [id number]   MEDICAL RECORD NO.:  [id number]                   PATIENT TYPE:  AMB   LOCATION:  DAY                                  FACILITY:  [facility]   PHYSICIAN:  Gita Kudo, M.D.              DATE OF BIRTH:  December 22, 1934   DATE OF PROCEDURE:  06/05/2003  DATE OF DISCHARGE:                                 OPERATIVE REPORT   OPERATION/PROCEDURE:  Laparoscopic cholecystectomy with intraoperative  cholangiogram.   SURGEON:  Gita Kudo, M.D.   ASSISTANT:  Marcial Pacas E. Earlene Plater, M.D.   ANESTHESIA:  General endotracheal anesthesia.   PREOPERATIVE DIAGNOSIS:  Abnormal gallbladder, adenomyosis.   POSTOPERATIVE DIAGNOSIS:  Abnormal gallbladder, adenomyosis, pending  pathology.   CLINICAL SUMMARY:  A 76 year old female with some upper abdominal pain and  chest discomfort underwent a workup and a CT scan of the abdomen and chest  and showed a thickening of the gallbladder and ultrasound showed no  gallbladder stones but probable adenomyomatosis.  Her liver function studies  were normal except for alk phos which was elevated and she comes in for  surgery.   OPERATIVE FINDINGS:  The gallbladder was thin-walled but appeared to be  lumpy at the fundus.  The cholangiogram appeared normal.   DESCRIPTION OF PROCEDURE:  Under satisfactory general endotracheal  anesthesia, the patient's abdomen was prepped and draped in the standard  fashion.  She received IV Ancef preoperatively.  A total of 30 mL of  Marcaine was infiltrated for all of the incisions.   An umbilical incision was made infraumbilically in the midline and carried  into the peritoneum.  The midline was controlled with a figure-of-eight 0  Vicryl suture and operating Hasson ports inserted and good CO2  pneumoperitoneum established.  Then two #5 ports were placed laterally and a  second #10 medially.  With excellent exposure, graspers in the lateral port  gave good  visualization and the cystic duct was carefully dissected  circumferentially.  When we were certain of the anatomy, a clip was placed  near the gallbladder, incision made in the duct and a percutaneously placed  catheter used to obtain a good cholangiogram.  Then after the cholangiogram  was reviewed, the catheter removed and the duct controlled with multiple  clips and divided.  Likewise the cystic artery was identified with two  branches, each controlled with multiple clips and divided and the  gallbladder removed from below upward using coagulation for dissection and  hemostasis.  After the gallbladder was removed from the liver bed, the bed  was checked for hemostasis by cautery, lavaged with saline and suctioned  dry.   Because a small hole was made in the gallbladder, there was a little  spillage of clear bile and the gallbladder was placed in an EndoCatch bag  which was then retrieved through the umbilicus incision after the camera was  moved to the upper port.  Then the saline was used to irrigate the abdomen  thoroughly until returns were clear, and then ports and CO2 were released.  The midline closed with a previous figure-of-eight and a second interrupted  0 Vicryl.  All  subcutaneous tissue was reapproximated with 4-0 Vicryl,  and Steri-Strips  approximated the skin.  Sterile absorbent dressings were applied and the  patient went to the recovery room from the operating room in good condition  without complications.                                               Gita Kudo, M.D.    MRL/MEDQ  D:  06/05/2003  T:  06/05/2003  Job:  [id number]   cc:   Colon Flattery, D.O.  [address]  [place]  [place] [address]  Fax: [phone]

## 2010-08-02 NOTE — Assessment & Plan Note (Signed)
[facility]                            CARDIOLOGY OFFICE NOTE   NAME:[name], [name]                         MRN:          [id number]  DATE:05/07/2006                            DOB:          09-13-1934    PRIMARY CARE PHYSICIAN:  Dr. Ardeen Garland.   NEPHROLOGIST:  Dr. Casimiro Needle.   REASON FOR PRESENTATION:  Evaluation patient status post acute inferior  infarct.   HISTORY OF PRESENT ILLNESS:  Patient presented on January the 31st with  chest pain.  She had a cardiac catheterization demonstrating 50%  proximal LAD stenosis, there was 50% stenosis in a marginal branch off  the circumflex, the right coronary artery had a 95% stenosis in the mid  segment.  The EF was 40% with hypokinesis of the inferior wall.  She was  treated with a Taxus stent.   She returns now.  She has not been getting any of the chest burning that  she had on that day.  She could not do cardiac rehab because the co-pay  was too much.  She has been trying to do a little bit of walking on her  own.  She has not been getting any chest pressure or neck discomfort.  She has been having no palpitations, presyncope, or syncope.  She has no  PND, or orthopnea.  She has been feeling somewhat depressed.   Of note, her discharge meds included Lisinopril but she did not get a  prescription for this, and says she has not been taking it.   PAST MEDICAL HISTORY:  Coronary artery disease as described,  nephrolithiasis, hypertension, abdominal aortic aneurysm followed by Dr.  Arbie Cookey, hyperlipidemia, hypothyroidism, renal insufficiency, previous  tobacco use.   ALLERGIES:  SULFA.   MEDICATIONS:  1. Coreg 12.5 mg b.i.d.  2. Plavix 75 mg daily.  3. Synthroid 75 mcg daily.  4. Furosemide 40 mg daily.  5. Lipitor 20 mg daily.   REVIEW OF SYSTEMS:  As stated in the HPI and otherwise negative for  other systems.   PHYSICAL EXAMINATION:  The patient is in no distress.  She does seem  sad.  Blood pressure 158/86, heart rate 69 and regular, weight 136 pounds,  body mass index 22.  HEENT:  Eyelids unremarkable.  Pupils are equal, round, and reactive to  light.  Fundi not visualized.  Oral mucosa unremarkable.  NECK:  No jugular venous distension at 45 degrees.  Carotid upstrokes  brisk and symmetric, no bruits.  No thyromegaly.  LYMPHATICS:  No cervical, axillary, inguinal adenopathy.  LUNGS:  Clear to auscultation bilaterally.  BACK:  No costovertebral angle tenderness.  CHEST:  Unremarkable.  HEART:  PMI nondisplaced or sustained.  S1, S2 within normal limits, no  S3, no S4, no clicks, no rubs, no murmurs.  ABDOMEN:  Flat, positive bowel sounds, normal frequency and pitch, no  bruits, no rebound, no midline pulsatile mass, no hepatomegaly, no  splenomegaly.  SKIN:  No rashes, no nodules.  EXTREMITIES:  2+ pulse, no edema, no cyanosis or clubbing.  NEURO:  Oriented to person, place, and  time.  Cranial nerves II-XII  grossly intact.  Motor grossly intact.   EKG sinus bradycardia, rate 52, axis within normal limits, intervals  within normal limits, inferolateral T-wave inversions consistent with  her recent micro infarction.   ASSESSMENT AND PLAN:  1. Coronary disease.  The patient is having no new symptoms.  We will      pursue aggressive secondary risk reduction.  2. Ischemic cardiomyopathy.  I would like to start the Lisinopril.  I      do note that she had some renal insufficiency.  She is due to see      Dr. Lowell Guitar today.  I gave her a prescription for the Lisinopril,      and a handwritten note for Dr. Lowell Guitar, to make sure that he is okay      with this.  I assume he will be following her BMET.  She will      continue on other medications as listed.  3. Depression.  I think this is a real issue and asked her to talk      with Dr. Lysbeth Galas about this.  4. Aneurysm.  She will have this followed by the vascular surgeons,      and she is due to see them in June.  5.  Anemia.  This can be followed by Dr. Lysbeth Galas going forward, and      seemed to be a post-procedural anemia.  6. Followup.  I would like to see her back in [place] in about 3 weeks      for the next medication titration.     Rollene Rotunda, MD, [facility]  Electronically Signed    JH/MedQ  DD: 05/07/2006  DT: 05/07/2006  Job #: [id number]   cc:   Delaney Meigs, M.D.  Mindi Slicker. Lowell Guitar, M.D.

## 2010-08-23 ENCOUNTER — Other Ambulatory Visit: Payer: Self-pay | Admitting: Cardiology

## 2010-09-12 ENCOUNTER — Encounter: Payer: Self-pay | Admitting: Cardiology

## 2010-10-25 ENCOUNTER — Encounter: Payer: Self-pay | Admitting: Cardiology

## 2010-12-17 ENCOUNTER — Telehealth: Payer: Self-pay | Admitting: Gastroenterology

## 2010-12-17 NOTE — Telephone Encounter (Signed)
Pt states she is having problems with rectal bleeding. Pt scheduled to see Dr. Arlyce Dice 12/19/10@10 :30am. Pt aware of appt date and time.

## 2010-12-19 ENCOUNTER — Ambulatory Visit (INDEPENDENT_AMBULATORY_CARE_PROVIDER_SITE_OTHER): Payer: MEDICARE | Admitting: Gastroenterology

## 2010-12-19 ENCOUNTER — Other Ambulatory Visit (INDEPENDENT_AMBULATORY_CARE_PROVIDER_SITE_OTHER): Payer: MEDICARE

## 2010-12-19 ENCOUNTER — Encounter: Payer: Self-pay | Admitting: Gastroenterology

## 2010-12-19 DIAGNOSIS — K625 Hemorrhage of anus and rectum: Secondary | ICD-10-CM

## 2010-12-19 DIAGNOSIS — Z86010 Personal history of colonic polyps: Secondary | ICD-10-CM

## 2010-12-19 LAB — CBC WITH DIFFERENTIAL/PLATELET
Basophils Absolute: 0 10*3/uL (ref 0.0–0.1)
Basophils Relative: 0.7 % (ref 0.0–3.0)
Eosinophils Relative: 5.2 % — ABNORMAL HIGH (ref 0.0–5.0)
HCT: 29.8 % — ABNORMAL LOW (ref 36.0–46.0)
Hemoglobin: 9.8 g/dL — ABNORMAL LOW (ref 12.0–15.0)
Lymphocytes Relative: 27.6 % (ref 12.0–46.0)
Lymphs Abs: 1.9 10*3/uL (ref 0.7–4.0)
MCHC: 33 g/dL (ref 30.0–36.0)
MCV: 89.7 fl (ref 78.0–100.0)
Monocytes Absolute: 0.5 10*3/uL (ref 0.1–1.0)
Monocytes Relative: 8 % (ref 3.0–12.0)
Neutro Abs: 4 10*3/uL (ref 1.4–7.7)
Platelets: 246 10*3/uL (ref 150.0–400.0)
RBC: 3.32 Mil/uL — ABNORMAL LOW (ref 3.87–5.11)
RDW: 15.6 % — ABNORMAL HIGH (ref 11.5–14.6)

## 2010-12-19 MED ORDER — HYDROCORTISONE ACETATE 25 MG RE SUPP: 25.0000 mg | Freq: Two times a day (BID) | RECTAL | Status: DC

## 2010-12-19 NOTE — Progress Notes (Signed)
History of Present Illness:  [name] has returned for evaluation of anemia and rectal bleeding. She has frequent bright red blood in the toilet water and on the tissue. She occasionally has to wear pads because of spontaneous bleeding. She is without abdominal or rectal pain. She denies change in bowel habits. Colonoscopy in April, 2011 demonstrated multiple small adenomatous polyps and hyperplastic polyps, and severe diverticulosis..  Hemoglobin one month ago was 9.7.  The patient has coronary artery disease and renal insufficiency. She is on Plavix.      Review of Systems: She complains of a chronic fatigue and lack of energy Pertinent positive and negative review of systems were noted in the above HPI section. All other review of systems were otherwise negative.    Current Medications, Allergies, Past Medical History, Past Surgical History, Family History and Social History were reviewed in [facility] electronic medical record  Vital signs were reviewed in today's medical record. Physical Exam: General: Well developed , well nourished, no acute distress Head: Normocephalic and atraumatic Eyes:  sclerae anicteric, EOMI Ears: Normal auditory acuity Mouth: No deformity or lesions Lungs: Clear throughout to auscultation Heart: Regular rate and rhythm; no murmurs, rubs or bruits Abdomen: Soft, non tender and non distended. No masses, hepatosplenomegaly or hernias noted. Normal Bowel sounds Rectal: Skin tags are present on examination of the rectum Musculoskeletal: Symmetrical with no gross deformities  Pulses:  Normal pulses noted Extremities: No clubbing, cyanosis, edema or deformities noted Neurological: Alert oriented x 4, grossly nonfocal Psychological:  Alert and cooperative. Normal mood and affect

## 2010-12-19 NOTE — Assessment & Plan Note (Signed)
Plan followup colonoscopy in 2016

## 2010-12-19 NOTE — Patient Instructions (Signed)
Your physician has requested that you go to the basement for the following lab work before leaving today: CBC Please follow up with Dr Arlyce Dice in 3 weeks. CC: Dr Joette Catching

## 2010-12-19 NOTE — Assessment & Plan Note (Addendum)
Rectal bleeding most likely is due to hemorrhoidal disease. Her anemia may be with related to renal sufficiency. It is relatively rare for hemorrhoidal bleeding to be a source of significant anemia.  Recommendations #1 Anusol a.c. suppositories #2 if bleeding does not decrease in the face of medical therapy I would consider band ligation

## 2010-12-20 ENCOUNTER — Telehealth: Payer: Self-pay | Admitting: Cardiology

## 2010-12-20 NOTE — Telephone Encounter (Signed)
Pt calling to cancel appt for 12/23/10; pt has appt at [facility] at 10am. Pt would like to see Dr. Antoine Poche ASAP in either [place] or [place] if possible. Please return pt call to discuss further.

## 2010-12-20 NOTE — Telephone Encounter (Signed)
App made for 12/23/10 9 am, msg left it is in [place],

## 2010-12-20 NOTE — Telephone Encounter (Signed)
Dr Gaynell Face office calling to schedule pt in [facility] asap, next time 10-10 but he' s full, appt 10-24 but they feel she needs to be seen sooner  Due to bp dropping she was taken off procardia, and needs eval to go back on, pls advise, pt will come to [place] if has to

## 2010-12-20 NOTE — Telephone Encounter (Signed)
Please call pt for app. Pt off Cardizem for two days, bp 100/60 with occ lightheadedness but better than she was two days ago. Pt prefers to be seen in [place], can 12/25/10 be double booked at 1:00? Pt  Will be at [facility] Monday 10/8 till 1:00 but may leave [facility]. Thank you, Alfonso Ramus RN

## 2010-12-23 ENCOUNTER — Ambulatory Visit: Payer: MEDICARE | Admitting: Cardiology

## 2010-12-23 ENCOUNTER — Encounter: Payer: Self-pay | Admitting: *Deleted

## 2010-12-23 NOTE — Telephone Encounter (Signed)
appt scheduled for 01/02/2011 in [place].  Left message for pt at home number

## 2010-12-23 NOTE — Telephone Encounter (Signed)
erro  neous encounter

## 2010-12-25 ENCOUNTER — Telehealth: Payer: Self-pay | Admitting: Cardiology

## 2010-12-25 NOTE — Telephone Encounter (Signed)
SPOKE WITH PT  IS COMPLAINING OF FATIGUE  FOR APPROX 1 WEEK SINCE STOPPING PROCARDIA   THIS IS ONLY  COMPLAINT WOULD LIKE TO SEE DR HOCHREIN BEFORE  01/02/11  PT AWARE WILL FORWARD TO DR HOCHREIN   FOR REVIEW WILL CALL BACK IF  MAY COME IN  FOR  EARLIER TIME  OTHERWISE WILL KEEP  APPT FOR NEXT WEEK PER PT ALSO  HAD APPT TODAY WITH PMD   AND LABS WERE DRAWN  PER PT IS ANEMIC.

## 2010-12-25 NOTE — Telephone Encounter (Signed)
bp this am 162/52 at her pcp, wants to know if dr hochrein has earlier appt than 10-18

## 2010-12-25 NOTE — Telephone Encounter (Signed)
Called to speak with pt and was told I needed to call back because they were in a call from [place].

## 2010-12-30 LAB — ABO/RH: ABO/RH(D): O NEG

## 2010-12-30 LAB — POCT I-STAT 7, (LYTES, BLD GAS, ICA,H+H)
Bicarbonate: 23
Hemoglobin: 8.2 — ABNORMAL LOW
Potassium: 3.5
TCO2: 24
pCO2 arterial: 36.6
pH, Arterial: 7.405 — ABNORMAL HIGH

## 2010-12-30 LAB — BASIC METABOLIC PANEL
BUN: 24 — ABNORMAL HIGH
CO2: 24
CO2: 25
Calcium: 8.2 — ABNORMAL LOW
Calcium: 8.4
Chloride: 108
Creatinine, Ser: 3.09 — ABNORMAL HIGH
Creatinine, Ser: 3.21 — ABNORMAL HIGH
Creatinine, Ser: 3.36 — ABNORMAL HIGH
GFR calc Af Amer: 17 — ABNORMAL LOW
GFR calc Af Amer: 18 — ABNORMAL LOW
GFR calc non Af Amer: 14 — ABNORMAL LOW
Glucose, Bld: 103 — ABNORMAL HIGH
Potassium: 3.9
Sodium: 136
Sodium: 141

## 2010-12-30 LAB — COMPREHENSIVE METABOLIC PANEL
ALT: 10
AST: 16
Albumin: 4
Alkaline Phosphatase: 122 — ABNORMAL HIGH
BUN: 29 — ABNORMAL HIGH
CO2: 27
Calcium: 9.5
Chloride: 106
Creatinine, Ser: 3.54 — ABNORMAL HIGH
GFR calc Af Amer: 15 — ABNORMAL LOW
GFR calc non Af Amer: 13 — ABNORMAL LOW
Glucose, Bld: 107 — ABNORMAL HIGH
Potassium: 4.3
Sodium: 140
Total Bilirubin: 0.7
Total Protein: 7.1

## 2010-12-30 LAB — PROTIME-INR: INR: 1.1

## 2010-12-30 LAB — CROSSMATCH

## 2010-12-30 LAB — CBC
HCT: 27.7 — ABNORMAL LOW
HCT: 33.9 — ABNORMAL LOW
Hemoglobin: 11.4 — ABNORMAL LOW
Hemoglobin: 9.4 — ABNORMAL LOW
Hemoglobin: 9.4 — ABNORMAL LOW
MCHC: 33.6
MCHC: 33.8
MCHC: 33.8
MCV: 89.2
MCV: 89.9
MCV: 90
Platelets: 273
RBC: 3.1 — ABNORMAL LOW
RBC: 3.11 — ABNORMAL LOW
RBC: 3.76 — ABNORMAL LOW
RDW: 13.1
RDW: 13.3
RDW: 13.6
WBC: 7.4

## 2011-01-01 NOTE — Telephone Encounter (Signed)
Never have been able to contact pt.  She has an appointment 10/18 with Dr Antoine Poche.  Will follow up then.

## 2011-01-02 ENCOUNTER — Ambulatory Visit (INDEPENDENT_AMBULATORY_CARE_PROVIDER_SITE_OTHER): Payer: MEDICARE | Admitting: Cardiology

## 2011-01-02 ENCOUNTER — Encounter: Payer: Self-pay | Admitting: Cardiology

## 2011-01-02 ENCOUNTER — Ambulatory Visit: Payer: MEDICARE | Admitting: Cardiology

## 2011-01-02 DIAGNOSIS — I2510 Atherosclerotic heart disease of native coronary artery without angina pectoris: Secondary | ICD-10-CM

## 2011-01-02 DIAGNOSIS — N259 Disorder resulting from impaired renal tubular function, unspecified: Secondary | ICD-10-CM

## 2011-01-02 DIAGNOSIS — I959 Hypotension, unspecified: Secondary | ICD-10-CM | POA: Insufficient documentation

## 2011-01-02 MED ORDER — FUROSEMIDE 40 MG PO TABS: 20.0000 mg | ORAL_TABLET | Freq: Every day | ORAL | Status: DC

## 2011-01-02 NOTE — Assessment & Plan Note (Signed)
This is followed by nephrology and she is being considered for a transplant.

## 2011-01-02 NOTE — Assessment & Plan Note (Signed)
I am not clear as to why this has changed.  I will reduce her lasix and stop the Coreg for now.  I will check a TSH.  I will see her back in two weeks.  She is to get a new BP monitor for home as her does not seem to be accurate.

## 2011-01-02 NOTE — Progress Notes (Signed)
HPI The patient presents for evaluation of low BPs. I last saw her over the past few weeks she has been weak and somewhat lightheaded with some orthostatic symptoms. Though her blood pressure readings at home are high readings at her primary care office has been well. About 2 weeks ago her Procardia was stopped and carvedilol decreased. Her blood pressure however still seems to be running low. She has not had any frank syncope. She is not feeling any palpitations. She has had no chest pressure, neck or arm discomfort. She has had no swelling weight gain or edema. She's had no weight loss. She has been slightly anemic on labs. She has chronic renal insufficiency.  Allergies  Allergen Reactions  . Sulfonamide Derivatives     Current Outpatient Prescriptions  Medication Sig Dispense Refill  . aspirin 81 MG tablet Take 81 mg by mouth daily.        . carvedilol (COREG) 12.5 MG tablet 1/2 tab twice a day      . citalopram (CELEXA) 20 MG tablet 1 tab daily      . furosemide (LASIX) 40 MG tablet 1 tab daily      . hydrocortisone (ANUSOL-HC) 25 MG suppository Place 25 mg rectally at bedtime.        . hydrocortisone (ANUSOL-HC) 25 MG suppository Place 1 suppository (25 mg total) rectally every 12 (twelve) hours.  12 suppository  1  . levothyroxine (SYNTHROID, LEVOTHROID) 50 MCG tablet Take 50 mcg by mouth daily.        . nitroGLYCERIN (NITROSTAT) 0.4 MG SL tablet Place 0.4 mg under the tongue as needed.        . Omega-3-acid Ethyl Esters (LOVAZA PO) Take 2 mg by mouth daily.        . Pitavastatin Calcium (LIVALO) 2 MG TABS Take by mouth. 1 tab daily       . PLAVIX 75 MG tablet TAKE ONE TABLET BY MOUTH ONE TIME DAILY  30 each  6  . senna (SENOKOT) 8.6 MG tablet 1 tab every other day         Past Medical History  Diagnosis Date  . CAD (coronary artery disease)     50% proximal LAD stenosis, 50% marginal stenosis of the circumflex, RCA 95% stenosis, EF 40% stenosis with hypokinesis of the interior  wall.  She had a Taxus stent placed in 04/16/2006  . Hypertension   . Abdominal aortic aneurysm   . Renal artery stenosis     S/P stenting of the right renal atrophic left renal  . Dyslipidemia   . Hypothyroidism   . Tobacco user     Previous  . Adenomatous polyp 2004, 2011  . Hyperplastic colon polyp 2004  . Hemorrhoids   . Diverticulosis   . Renal insufficiency   . Anemia     Past Surgical History  Procedure Date  . Abdominal hysterectomy   . Carpal tunnel release   . Cholecystectomy   . Repair right common femoral artery     ROS:  As stated in the HPI and negative for all other systems.  PHYSICAL EXAM BP 115/73  Pulse 70  Ht 5\' 5"  (1.651 m)  Wt 144 lb (65.318 kg)  BMI 23.96 kg/m2 GENERAL:  Well appearing HEENT:  Pupils equal round and reactive, fundi not visualized, oral mucosa unremarkable NECK:  No jugular venous distention, waveform within normal limits, carotid upstroke brisk and symmetric, no bruits, no thyromegaly LYMPHATICS:  No cervical, inguinal adenopathy LUNGS:  Clear  to auscultation bilaterally BACK:  No CVA tenderness CHEST:  Unremarkable HEART:  PMI not displaced or sustained,S1 and S2 within normal limits, no S3, no S4, no clicks, no rubs, no murmurs ABD:  Flat, positive bowel sounds normal in frequency in pitch, no bruits, no rebound, no guarding, no midline pulsatile mass, no hepatomegaly, no splenomegaly EXT:  2 plus pulses throughout, no edema, no cyanosis no clubbing SKIN:  No rashes no nodules NEURO:  Cranial nerves II through XII grossly intact, motor grossly intact throughout PSYCH:  Cognitively intact, oriented to person place and time   EKG:  Sinus rhythm, rate 58, axis within normal limits, intervals within normal limits, no acute ST-T wave changes.   ASSESSMENT AND PLAN

## 2011-01-02 NOTE — Patient Instructions (Addendum)
Please stop Carvedilol (Coreg) Decrease Furosemide (Lasix) to 1/2 of your normal dose or 20 mg a day Continue all other medications the same  Follow up with Dr Antoine Poche

## 2011-01-02 NOTE — Assessment & Plan Note (Signed)
She has no anginal symptoms.  I do not suspect that her current problems are related to CAD.  I reviewed the last stress test in 2010 and it was without ischemia.

## 2011-01-07 ENCOUNTER — Telehealth: Payer: Self-pay | Admitting: Cardiology

## 2011-01-07 DIAGNOSIS — Z01810 Encounter for preprocedural cardiovascular examination: Secondary | ICD-10-CM

## 2011-01-07 DIAGNOSIS — I701 Atherosclerosis of renal artery: Secondary | ICD-10-CM

## 2011-01-07 DIAGNOSIS — I2510 Atherosclerotic heart disease of native coronary artery without angina pectoris: Secondary | ICD-10-CM

## 2011-01-07 NOTE — Telephone Encounter (Signed)
Discussed pt with Adrianna - pt would like to have her testing done here for transplant.  She will need a myoview, echo, carotid doppler and iliac veins and arteries.  She would then need a clearance statement with risk stratification.  Aware I will forward information to Dr Antoine Poche for orders.

## 2011-01-07 NOTE — Telephone Encounter (Signed)
pls call, Adrianna with the [facility] Transplant Team, needs a call from nurse

## 2011-01-08 ENCOUNTER — Ambulatory Visit: Payer: MEDICARE | Admitting: Cardiology

## 2011-01-08 NOTE — Telephone Encounter (Signed)
OK to order the testing required for transplant.

## 2011-01-12 ENCOUNTER — Other Ambulatory Visit: Payer: Self-pay | Admitting: Cardiology

## 2011-01-15 ENCOUNTER — Telehealth: Payer: Self-pay | Admitting: Cardiology

## 2011-01-15 NOTE — Telephone Encounter (Signed)
Called wanting to clarify her medications. Wanted to be sure about stopping Coreg and increasing Lasix. Advised per his note to continue all her other meds.

## 2011-01-15 NOTE — Telephone Encounter (Signed)
Pt calling to clarify what medications MD took pt off of at last ov. Please return pt call to discuss further.

## 2011-01-22 ENCOUNTER — Encounter: Payer: Self-pay | Admitting: Gastroenterology

## 2011-01-22 ENCOUNTER — Ambulatory Visit (INDEPENDENT_AMBULATORY_CARE_PROVIDER_SITE_OTHER): Payer: Medicare Other | Admitting: Gastroenterology

## 2011-01-22 DIAGNOSIS — K625 Hemorrhage of anus and rectum: Secondary | ICD-10-CM

## 2011-01-22 DIAGNOSIS — Z86010 Personal history of colonic polyps: Secondary | ICD-10-CM

## 2011-01-22 NOTE — Progress Notes (Signed)
[name] has returned for followup of rectal bleeding. With suppositories bleeding has entirely subsided. She is without pain or other GI complaints.

## 2011-01-22 NOTE — Assessment & Plan Note (Addendum)
Limited rectal bleeding is very likely secondary to hemorrhoids.  Recommendations #1 suppositories as needed

## 2011-01-23 ENCOUNTER — Other Ambulatory Visit: Payer: Self-pay | Admitting: Cardiology

## 2011-01-23 DIAGNOSIS — I739 Peripheral vascular disease, unspecified: Secondary | ICD-10-CM

## 2011-01-27 ENCOUNTER — Encounter: Payer: MEDICARE | Admitting: Cardiology

## 2011-01-27 ENCOUNTER — Encounter (INDEPENDENT_AMBULATORY_CARE_PROVIDER_SITE_OTHER): Payer: Medicare Other | Admitting: Cardiology

## 2011-01-27 ENCOUNTER — Ambulatory Visit (HOSPITAL_COMMUNITY): Payer: Medicare Other | Attending: Cardiology | Admitting: Radiology

## 2011-01-27 ENCOUNTER — Other Ambulatory Visit (HOSPITAL_COMMUNITY): Payer: MEDICARE | Admitting: Radiology

## 2011-01-27 DIAGNOSIS — I739 Peripheral vascular disease, unspecified: Secondary | ICD-10-CM

## 2011-01-27 DIAGNOSIS — Z8249 Family history of ischemic heart disease and other diseases of the circulatory system: Secondary | ICD-10-CM | POA: Insufficient documentation

## 2011-01-27 DIAGNOSIS — I252 Old myocardial infarction: Secondary | ICD-10-CM | POA: Insufficient documentation

## 2011-01-27 DIAGNOSIS — I10 Essential (primary) hypertension: Secondary | ICD-10-CM | POA: Insufficient documentation

## 2011-01-27 DIAGNOSIS — I6529 Occlusion and stenosis of unspecified carotid artery: Secondary | ICD-10-CM

## 2011-01-27 DIAGNOSIS — Z01810 Encounter for preprocedural cardiovascular examination: Secondary | ICD-10-CM | POA: Insufficient documentation

## 2011-01-27 DIAGNOSIS — I079 Rheumatic tricuspid valve disease, unspecified: Secondary | ICD-10-CM | POA: Insufficient documentation

## 2011-01-27 DIAGNOSIS — I701 Atherosclerosis of renal artery: Secondary | ICD-10-CM

## 2011-01-27 DIAGNOSIS — I2510 Atherosclerotic heart disease of native coronary artery without angina pectoris: Secondary | ICD-10-CM

## 2011-01-27 DIAGNOSIS — R42 Dizziness and giddiness: Secondary | ICD-10-CM | POA: Insufficient documentation

## 2011-01-27 DIAGNOSIS — N289 Disorder of kidney and ureter, unspecified: Secondary | ICD-10-CM | POA: Insufficient documentation

## 2011-01-27 DIAGNOSIS — E785 Hyperlipidemia, unspecified: Secondary | ICD-10-CM | POA: Insufficient documentation

## 2011-01-27 DIAGNOSIS — I714 Abdominal aortic aneurysm, without rupture: Secondary | ICD-10-CM

## 2011-01-27 DIAGNOSIS — Z87891 Personal history of nicotine dependence: Secondary | ICD-10-CM | POA: Insufficient documentation

## 2011-01-27 DIAGNOSIS — N189 Chronic kidney disease, unspecified: Secondary | ICD-10-CM

## 2011-01-27 MED ORDER — TECHNETIUM TC 99M TETROFOSMIN IV KIT
11.0000 | PACK | Freq: Once | INTRAVENOUS | Status: AC | PRN
  Administered 2011-01-27: 11 via INTRAVENOUS

## 2011-01-27 MED ORDER — REGADENOSON 0.4 MG/5ML IV SOLN
0.4000 mg | Freq: Once | INTRAVENOUS | Status: AC
  Administered 2011-01-27: 0.4 mg via INTRAVENOUS

## 2011-01-27 MED ORDER — TECHNETIUM TC 99M TETROFOSMIN IV KIT
33.0000 | PACK | Freq: Once | INTRAVENOUS | Status: AC | PRN
  Administered 2011-01-27: 33 via INTRAVENOUS

## 2011-01-27 NOTE — Progress Notes (Signed)
[facility] SITE 3 NUCLEAR MED [address] [place] [address] [phone]  Cardiology Nuclear Med Study  [name] is a 76 y.o. female [id number] Aug 22, 1934   Nuclear Med Background Indication for Stress Test:  Evaluation for Ischemia,PTCA and Stent Patency and Pending Surgical Clearance: Kidney transplant History: 08 Angioplasty,08 Echo:EF=60-70%,08Heart Catheterization: EF=40% with occlusion to PTCA and Stent,08 Myocardial Infarction:NSTEMI, 6/10 Myocardial Perfusion Study:EF=72% and 08 Stents:RCA Cardiac Risk Factors: Family History - CAD, History of Smoking, Hypertension, Lipids and 11 PVD: Renal Artery Stent  Symptoms:  Light-Headedness   Nuclear Pre-Procedure Caffeine/Decaff Intake:  None NPO After: 7:00pm   Lungs:  clear IV 0.9% NS with Angio Cath:  20g  IV Site: R Antecubital  IV Started by:  Stanton Kidney, EMT-P  Chest Size (in):  36 Cup Size: B  Height: 5\' 5"  (1.651 m)  Weight:  140 lb (63.504 kg)  BMI:  Body mass index is 23.30 kg/(m^2). Tech Comments:  Coreg on hold > 2 weeks, per patient.    Nuclear Med Study 1 or 2 day study: 1 day  Stress Test Type:  Treadmill/Lexiscan  Reading MD: Willa Rough, MD  Order Authorizing Provider:  Melany Guernsey  Resting Radionuclide: Technetium 36m Tetrofosmin  Resting Radionuclide Dose: 11.0 mCi   Stress Radionuclide:  Technetium 69m Tetrofosmin  Stress Radionuclide Dose: 33.0 mCi           Stress Protocol Rest HR: 54 Stress HR: 122  Rest BP: 139/83 Stress BP: 155/89  Exercise Time (min): 2:00 METS: 1.6   Predicted Max HR: 146 bpm % Max HR: 83.56 bpm Rate Pressure Product: 44010   Dose of Adenosine (mg):  n/a Dose of Lexiscan: 0.4 mg  Dose of Atropine (mg): n/a Dose of Dobutamine: n/a mcg/kg/min (at max HR)  Stress Test Technologist: Milana Na, EMT-P  Nuclear Technologist:  Domenic Polite, CNMT     Rest Procedure:  Myocardial perfusion imaging was performed at rest 45 minutes  following the intravenous administration of Technetium 34m Tetrofosmin. Rest ECG: Sinus Bradycardia  Stress Procedure:  The patient received IV Lexiscan 0.4 mg over 15-seconds with concurrent low level exercise and then Technetium 21m Tetrofosmin was injected at 30-seconds while the patient continued walking one more minute.  There were no significant changes with Lexiscan. Patient experienced SOB and fatigue and no ectopy. Quantitative spect images were obtained after a 45-minute delay. Stress ECG: No significant change from baseline ECG  QPS Raw Data Images:  Patient motion noted; appropriate software correction applied. Stress Images:  Normal homogeneous uptake in all areas of the myocardium. Rest Images:  Normal homogeneous uptake in all areas of the myocardium. Subtraction (SDS):  No evidence of ischemia. Transient Ischemic Dilatation (Normal <1.22):  0.77 Lung/Heart Ratio (Normal <0.45):  0.25  Quantitative Gated Spect Images QGS EDV:  66 ml QGS ESV:  23 ml QGS cine images:  Normal Wall Motion QGS EF: 65%  Impression Exercise Capacity:  Lexiscan with low level exercise. BP Response:  Normal blood pressure response. Clinical Symptoms:  SOB ECG Impression:  No significant ST segment change suggestive of ischemia. Comparison with Prior Nuclear Study: No significant change from previous study  (2010)  Overall Impression:  Normal stress nuclear study.  Willa Rough

## 2011-01-31 ENCOUNTER — Encounter: Payer: Self-pay | Admitting: Cardiology

## 2011-02-10 ENCOUNTER — Encounter: Payer: Self-pay | Admitting: Cardiology

## 2011-02-12 ENCOUNTER — Encounter: Payer: Self-pay | Admitting: Cardiology

## 2011-02-12 ENCOUNTER — Ambulatory Visit (INDEPENDENT_AMBULATORY_CARE_PROVIDER_SITE_OTHER): Payer: Medicare Other | Admitting: Cardiology

## 2011-02-12 DIAGNOSIS — I714 Abdominal aortic aneurysm, without rupture: Secondary | ICD-10-CM

## 2011-02-12 DIAGNOSIS — E785 Hyperlipidemia, unspecified: Secondary | ICD-10-CM

## 2011-02-12 DIAGNOSIS — I2510 Atherosclerotic heart disease of native coronary artery without angina pectoris: Secondary | ICD-10-CM

## 2011-02-12 DIAGNOSIS — N259 Disorder resulting from impaired renal tubular function, unspecified: Secondary | ICD-10-CM

## 2011-02-12 NOTE — Assessment & Plan Note (Signed)
This will be followed as above.   

## 2011-02-12 NOTE — Progress Notes (Signed)
HPI The patient presents for follow up of low BPs.   Since I last saw her she has completed a big work up for possible renal transplant.  I reviewed with her today the results of her nuclear study which demonstrated no ischemia. She has a normal ejection fraction. Echo was unremarkable. Carotid Dopplers demonstrated minimal plaque. She does have an aortic aneurysm which is 4.4 x 4.6. This is to be followed in 6 months. She's going to see Dr. Hart Rochester for this as well.  She has been cleared for possible transplant.    She has had none of the low blood pressures or lightheadedness that she was having. She's had no presyncope or syncope. She denies any chest pressure, neck or arm discomfort. She's had no shortness of breath. Of note she was treated for vertigo.  Allergies  Allergen Reactions  . Sulfonamide Derivatives     Current Outpatient Prescriptions  Medication Sig Dispense Refill  . aspirin 81 MG tablet Take 81 mg by mouth daily.        . citalopram (CELEXA) 20 MG tablet 1 tab daily      . furosemide (LASIX) 40 MG tablet Take 0.5 tablets (20 mg total) by mouth daily. 1 tab daily      . hydrocortisone (ANUSOL-HC) 25 MG suppository Place 25 mg rectally at bedtime.        Marland Kitchen levothyroxine (SYNTHROID, LEVOTHROID) 50 MCG tablet Take 50 mcg by mouth daily.        Marland Kitchen NIFEdipine (PROCARDIA XL/ADALAT-CC) 60 MG 24 hr tablet TAKE ONE TABLET BY MOUTH ONE TIME DAILY  30 tablet  5  . nitroGLYCERIN (NITROSTAT) 0.4 MG SL tablet Place 0.4 mg under the tongue as needed.        . Omega-3-acid Ethyl Esters (LOVAZA PO) Take 2 mg by mouth daily.        . Pitavastatin Calcium (LIVALO) 2 MG TABS Take by mouth. 1 tab daily       . PLAVIX 75 MG tablet TAKE ONE TABLET BY MOUTH ONE TIME DAILY  30 each  6  . senna (SENOKOT) 8.6 MG tablet 1 tab every other day         Past Medical History  Diagnosis Date  . CAD (coronary artery disease)     50% proximal LAD stenosis, 50% marginal stenosis of the circumflex, RCA 95%  stenosis, EF 40% stenosis with hypokinesis of the interior wall.  She had a Taxus stent placed in 04/16/2006  . Hypertension   . Abdominal aortic aneurysm   . Renal artery stenosis     S/P stenting of the right renal atrophic left renal  . Dyslipidemia   . Hypothyroidism   . Tobacco user     Previous  . Adenomatous polyp 2004, 2011  . Hyperplastic colon polyp 2004  . Hemorrhoids   . Diverticulosis   . Renal insufficiency   . Anemia     Past Surgical History  Procedure Date  . Abdominal hysterectomy   . Carpal tunnel release   . Cholecystectomy   . Repair right common femoral artery     ROS:  As stated in the HPI and negative for all other systems.  PHYSICAL EXAM BP 141/81  Pulse 89  Ht 5\' 5"  (1.651 m)  Wt 144 lb (65.318 kg)  BMI 23.96 kg/m2 GENERAL:  Well appearing HEENT:  Pupils equal round and reactive, fundi not visualized, oral mucosa unremarkable NECK:  No jugular venous distention, waveform within normal limits,  carotid upstroke brisk and symmetric, no bruits, no thyromegaly LYMPHATICS:  No cervical, inguinal adenopathy LUNGS:  Clear to auscultation bilaterally BACK:  No CVA tenderness CHEST:  Unremarkable HEART:  PMI not displaced or sustained,S1 and S2 within normal limits, no S3, no S4, no clicks, no rubs, no murmurs ABD:  Flat, positive bowel sounds normal in frequency in pitch, no bruits, no rebound, no guarding, no midline pulsatile mass, no hepatomegaly, no splenomegaly EXT:  2 plus pulses throughout, no edema, no cyanosis no clubbing SKIN:  No rashes no nodules NEURO:  Cranial nerves II through XII grossly intact, motor grossly intact throughout PSYCH:  Cognitively intact, oriented to person place and time   ASSESSMENT AND PLAN

## 2011-02-12 NOTE — Assessment & Plan Note (Signed)
She is currently at acceptable risk for planned transplant if this happens.

## 2011-02-12 NOTE — Assessment & Plan Note (Signed)
She had a negative stress test. No she will have risk reduction.further testing is indicated.

## 2011-02-12 NOTE — Assessment & Plan Note (Signed)
This is followed by Josue Hector, MD with a goal LDL less than 100 and HDL greater than 40.

## 2011-03-20 ENCOUNTER — Other Ambulatory Visit: Payer: Self-pay | Admitting: Cardiology

## 2011-03-24 ENCOUNTER — Other Ambulatory Visit: Payer: Self-pay

## 2011-03-27 ENCOUNTER — Encounter: Payer: Self-pay | Admitting: Cardiology

## 2011-03-31 ENCOUNTER — Other Ambulatory Visit: Payer: Self-pay | Admitting: Cardiology

## 2011-04-07 ENCOUNTER — Encounter: Payer: Self-pay | Admitting: Vascular Surgery

## 2011-04-08 ENCOUNTER — Ambulatory Visit (INDEPENDENT_AMBULATORY_CARE_PROVIDER_SITE_OTHER): Payer: Medicare Other | Admitting: Vascular Surgery

## 2011-04-08 ENCOUNTER — Encounter: Payer: Self-pay | Admitting: Vascular Surgery

## 2011-04-08 ENCOUNTER — Other Ambulatory Visit: Payer: Medicare Other

## 2011-04-08 VITALS — BP 134/80 | HR 107 | Resp 18 | Ht 65.0 in | Wt 142.0 lb

## 2011-04-08 DIAGNOSIS — I714 Abdominal aortic aneurysm, without rupture: Secondary | ICD-10-CM

## 2011-04-08 DIAGNOSIS — I701 Atherosclerosis of renal artery: Secondary | ICD-10-CM

## 2011-04-08 NOTE — Progress Notes (Signed)
Subjective:     Patient ID: [name], female   DOB: 1934-12-09, 77 y.o.   MRN: [id number]  HPI this 77 year old female is seen for evaluation of an abdominal aortic aneurysm. She is known to me back several years ago had a right renal artery stent placed. She has an atrophic left kidney. Her creatinine was 3.68 in 2008 and she is now approaching a kidney transplant. This is to be done at [facility]. They are concerned about the growth of the aneurysm which recently has been shown to be 4.6 or 5 cm in maximum diameter by ultrasound. She has not had a CT scan. The question is whether to fix the aneurysm prior to a renal transplant. She is ready to be on the transplant list.  Past Medical History  Diagnosis Date  . CAD (coronary artery disease)     50% proximal LAD stenosis, 50% marginal stenosis of the circumflex, RCA 95% stenosis, EF 40% stenosis with hypokinesis of the interior wall.  She had a Taxus stent placed in 04/16/2006  . Hypertension   . Abdominal aortic aneurysm   . Renal artery stenosis     S/P stenting of the right renal atrophic left renal  . Dyslipidemia   . Hypothyroidism   . Tobacco user     Previous  . Adenomatous polyp 2004, 2011  . Hyperplastic colon polyp 2004  . Hemorrhoids   . Diverticulosis   . Renal insufficiency   . Anemia     History  Substance Use Topics  . Smoking status: Former Smoker -- 1 years    Types: Cigarettes    Quit date: 03/17/2006  . Smokeless tobacco: Never Used   Comment: Smokes 3-4 cigarettes a day and has been doing so for x5 years  . Alcohol Use: No    Family History  Problem Relation Age of Onset  . Uterine cancer Mother   . Heart attack Father 57  . Peripheral vascular disease Sister   . Other Sister     Renal artery stenosis  . Heart attack Brother 42  . Peripheral vascular disease Sister   . Colon cancer Neg Hx     Allergies  Allergen Reactions  . Sulfonamide Derivatives     Current outpatient  prescriptions:aspirin 81 MG tablet, Take 81 mg by mouth daily.  , Disp: , Rfl: ;  citalopram (CELEXA) 20 MG tablet, 1 tab daily, Disp: , Rfl: ;  clopidogrel (PLAVIX) 75 MG tablet, TAKE ONE TABLET BY MOUTH ONE TIME DAILY, Disp: 30 tablet, Rfl: 5;  furosemide (LASIX) 40 MG tablet, Take 0.5 tablets (20 mg total) by mouth daily. 1 tab daily, Disp: , Rfl:  hydrocortisone (ANUSOL-HC) 25 MG suppository, Place 25 mg rectally at bedtime.  , Disp: , Rfl: ;  levothyroxine (SYNTHROID, LEVOTHROID) 50 MCG tablet, Take 50 mcg by mouth daily.  , Disp: , Rfl: ;  NIFEdipine (PROCARDIA XL/ADALAT-CC) 60 MG 24 hr tablet, TAKE ONE TABLET BY MOUTH ONE TIME DAILY, Disp: 30 tablet, Rfl: 5;  NITROSTAT 0.4 MG SL tablet, DISSOLVE 1 TABLET UNDER TONGUE EVERY 5 MINUTES FOR 3 DOSES FOR CHESTPAIN, Disp: 25 each, Rfl: 7 Omega-3-acid Ethyl Esters (LOVAZA PO), Take 2 mg by mouth daily.  , Disp: , Rfl: ;  Pitavastatin Calcium (LIVALO) 2 MG TABS, Take by mouth. 1 tab daily , Disp: , Rfl: ;  senna (SENOKOT) 8.6 MG tablet, 1 tab every other day , Disp: , Rfl:   BP 134/80  Pulse 107  Resp  18  Ht 5\' 5"  (1.651 m)  Wt 142 lb (64.411 kg)  BMI 23.63 kg/m2  Body mass index is 23.63 kg/(m^2).        Review of Systems denies chest pain, orthopnea, PND, or hemoptysis. She does have dyspnea on exertion and easy fatigability. Denies claudication symptoms. Other systems are negative and a complete review of     Objective:   Physical Exam pressure 130/80 heart rate 107 respirations 18 General well-developed well-nourished female in no apparent distress alert and oriented x3 HEENT normal for age Lungs rhonchi or wheezing Cardiovascular regular rhythm no murmurs carotid pulses 3+ no audible bruits Abdomen soft. There is a pulsatile mass in the 4-5 cm range which is nontender. Neurologic normal Lower extremities 3+ femoral and posterior tibial pulses palpable bilaterally Skin free of rashes Musculoskeletal free of major deformities   I  have reviewed the studies provided by [facility]     Assessment:     Abdominal aortic aneurysm with recent enlargement-has not had CT scan Renal insufficiency with solitary right kidney previous renal artery stent-ready to be on transplant list at  [facility]    Plan:     #1 CT scan of abdomen with no contrast #2 duplex scan of right renal artery and stented vessel on return in 2 weeks #3 return in 2 weeks to review above and make recommendations

## 2011-04-08 NOTE — Progress Notes (Signed)
Addended by: Sharee Pimple on: 04/08/2011 11:55 AM   Modules accepted: Orders

## 2011-04-21 ENCOUNTER — Encounter: Payer: Self-pay | Admitting: Vascular Surgery

## 2011-04-22 ENCOUNTER — Encounter: Payer: Self-pay | Admitting: Vascular Surgery

## 2011-04-22 ENCOUNTER — Ambulatory Visit (INDEPENDENT_AMBULATORY_CARE_PROVIDER_SITE_OTHER): Payer: Medicare Other | Admitting: Vascular Surgery

## 2011-04-22 ENCOUNTER — Ambulatory Visit
Admission: RE | Admit: 2011-04-22 | Discharge: 2011-04-22 | Disposition: A | Discharge status: Home / Self Care | Payer: Medicare Other | Source: Ambulatory Visit | Attending: Vascular Surgery | Admitting: Vascular Surgery

## 2011-04-22 VITALS — BP 130/79 | HR 83 | Resp 16 | Ht 65.0 in | Wt 136.0 lb

## 2011-04-22 DIAGNOSIS — I714 Abdominal aortic aneurysm, without rupture: Secondary | ICD-10-CM

## 2011-04-22 DIAGNOSIS — I701 Atherosclerosis of renal artery: Secondary | ICD-10-CM

## 2011-04-22 DIAGNOSIS — Z48812 Encounter for surgical aftercare following surgery on the circulatory system: Secondary | ICD-10-CM

## 2011-04-22 DIAGNOSIS — I7090 Unspecified atherosclerosis: Secondary | ICD-10-CM | POA: Insufficient documentation

## 2011-04-22 NOTE — Progress Notes (Signed)
Subjective:     Patient ID: [name], female   DOB: Jul 15, 1934, 77 y.o.   MRN: [id number]  HPI this 77 year old female returns today for further discussion regarding her abdominal aortic aneurysm. She is being evaluated for possible renal transplant at [facility]. The transplant surgeons would prefer to have the aneurysm treated prior to any possible renal transplant. Patient is not on hemodialysis at the present time. He had a repeat CT scan today which is not done with contrast because of a history of renal insufficiency with creatinine 3.5   Review of Systems     Objective:   Physical ExamBP 130/79  Pulse 83  Resp 16  Ht 5\' 5"  (1.651 m)  Wt 136 lb (61.689 kg)  BMI 22.63 kg/m2  SpO2 99%   general well-developed well-nourished female in no apparent distress Abdomen soft nontender with approximate 5 cm pulsatile mass Lower extremities 3+ femoral pulses bilaterally Assessment:     Approximate 5 cm infrarenal abdominal aortic aneurysm    Plan:     Will further evaluate this regarding potential for aortic stent grafting. There is some angulation of the neck to the patient's right side. We'll get other opinions from other physicians regarding this and discuss further with patient in the next few week

## 2011-04-22 NOTE — Progress Notes (Signed)
Right renal artery duplex with AAA done @ [facility] 04/22/2011

## 2011-05-12 ENCOUNTER — Encounter: Payer: Self-pay | Admitting: Vascular Surgery

## 2011-05-13 ENCOUNTER — Encounter: Payer: Self-pay | Admitting: Vascular Surgery

## 2011-05-13 ENCOUNTER — Ambulatory Visit (INDEPENDENT_AMBULATORY_CARE_PROVIDER_SITE_OTHER): Payer: Medicare Other | Admitting: Vascular Surgery

## 2011-05-13 VITALS — BP 140/90 | HR 77 | Resp 16 | Ht 65.0 in | Wt 140.0 lb

## 2011-05-13 DIAGNOSIS — I714 Abdominal aortic aneurysm, without rupture: Secondary | ICD-10-CM

## 2011-05-13 NOTE — Progress Notes (Signed)
Addended by: Sharee Pimple on: 05/13/2011 11:59 AM   Modules accepted: Orders

## 2011-05-13 NOTE — Progress Notes (Signed)
Subjective:     Patient ID: [name], female   DOB: 1934/07/25, 77 y.o.   MRN: [id number]  HPI this 77 year old female returns today to further discuss her abdominal aortic aneurysm. The most recent CT scan done a few weeks ago reveals maximum diameter to be only 4.6 cm. She is a candidate for a stent graft but does have a short slightly angulated neck the stent graft will require administration of some IV contrast and she has chronic renal insufficiency with creatinine slightly over 3.0. The transplant service at [facility] was concerned that the aneurysm needed to be addressed prior to going on the transplant list. I have reviewed the CT scan with my colleagues and we all agree that no treatment is indicated for the aneurysm at this point in time. Open surgery could very well precipitate renal failure and dialysis as could a small amount of contrast with a stent graft. He has had a creatinine over 3 for the past 5 years.  Review of Systems     Objective:   Physical Exam     Assessment:    stable infrarenal abdominal aortic aneurysm measuring 4.6 cm in maximum diameter  Do not feel the patient needs repair of this aneurysm at this time either with stent graft or open repair I have discussed this at length with the patient and the fact that she could end up on dialysis following either open surgery or stent graft    Plan:     Will continue to follow patient on regular basis Return in one year with duplex scan in our office to monitor size of the aneurysm

## 2011-10-20 ENCOUNTER — Other Ambulatory Visit: Payer: Self-pay | Admitting: Cardiology

## 2011-10-20 NOTE — Telephone Encounter (Signed)
..   Requested Prescriptions   Pending Prescriptions Disp Refills  . clopidogrel (PLAVIX) 75 MG tablet [Pharmacy Med Name: CLOPIDOGREL 75MG  TABS] 30 tablet 4    Sig: TAKE ONE TABLET BY MOUTH ONE TIME DAILY  Please call the office to make an appointment for November 2013

## 2011-11-03 ENCOUNTER — Other Ambulatory Visit: Payer: Self-pay | Admitting: Cardiology

## 2012-02-10 ENCOUNTER — Encounter: Payer: Self-pay | Admitting: Gastroenterology

## 2012-02-10 ENCOUNTER — Telehealth: Payer: Self-pay | Admitting: *Deleted

## 2012-02-10 ENCOUNTER — Ambulatory Visit (INDEPENDENT_AMBULATORY_CARE_PROVIDER_SITE_OTHER): Payer: Medicare Other | Admitting: Gastroenterology

## 2012-02-10 VITALS — BP 132/74 | HR 66 | Ht 62.5 in | Wt 142.0 lb

## 2012-02-10 DIAGNOSIS — Z86010 Personal history of colonic polyps: Secondary | ICD-10-CM

## 2012-02-10 DIAGNOSIS — K625 Hemorrhage of anus and rectum: Secondary | ICD-10-CM

## 2012-02-10 NOTE — Patient Instructions (Addendum)
You will be contacted by our office prior to your procedure for directions on holding your Plavix.  If you do not hear from our office 1 week prior to your scheduled procedure, please call [phone] to discuss.   Flexible Sigmoidoscopy Your caregiver has ordered a flexible sigmoidoscopy. This is an exam to evaluate your lower colon. In this exam your colon is cleansed and a short fiber optic tube is inserted through your rectum and into your colon. The fiber optic scope (endoscope) is a short bundle of enclosed flexible small glass fibers. It transmits light to the area examined and images from that area to your caregiver. You do not have to worry about glass breakage in the endoscope. Discomfort is usually minimal. Sedatives and pain medications are generally not required. This exam helps to detect tumors (lumps), polyps, inflammation (swelling and soreness), and areas of bleeding. It may also be used to take biopsies. These are small pieces of tissue taken to examine under a microscope. LET YOUR CAREGIVER KNOW ABOUT:  Allergies.  Medications taken including herbs, eye drops, over the counter medications, and creams.  Use of steroids (by mouth or creams).  Previous problems with anesthetics or novocaine  Possibility of pregnancy, if this applies.  History of blood clots (thrombophlebitis).  History of bleeding or blood problems.  Previous surgery.  Other health problems. BEFORE THE PROCEDURE Eat normally the night before the exam. Your caregiver may order a mild enema or laxative the night before. No eating or drinking should occur after midnight until the procedure is completed. A rectal suppository or enemas may be given in the morning prior to your procedure. You will be brought to the examination area in a hospital gown. You should be present 60 minutes prior to your procedure or as directed.  AFTER THE PROCEDURE   There is sometimes a little blood passed with the first bowel  movement. Do not be concerned. Because air is often used during the exam, it is not unusual to pass gas and experience abdominal (belly) cramping. Walking or a warm pack on your abdomen may help with this. Do not sleep with a heating pad as burns can occur.  You may resume all normal eating and activities.  Only take over-the-counter or prescription medicines for pain, discomfort, or fever as directed by your caregiver. Do not use aspirin or blood thinners if a biopsy (tissue sample) was taken. Consult your caregiver for medication usage if biopsies were taken.  Call for your results as instructed by your caregiver. Remember, it is your responsibility to obtain the results of your biopsy. Do not assume everything is fine because you do not hear from your caregiver. SEEK IMMEDIATE MEDICAL CARE IF:  An oral temperature above 102 F (38.9 C) develops.  You pass large blood clots or fill a toilet with blood following the procedure. This may also occur 10 to 14 days following the procedure. It is more likely if a biopsy was taken.  You develop abdominal pain not relieved with medication or that is getting worse rather than better. Document Released: 02/29/2000 Document Revised: 05/26/2011 Document Reviewed: 12/11/2004 [facility] Patient Information 2013 [place], [place].

## 2012-02-10 NOTE — Progress Notes (Signed)
History of Present Illness: [name] 77-year-old white female with history of colon polyps and hemorrhoids referred for evaluation of rectal pain and bleeding. She's had similar complaints over the years and was last evaluated almost one year ago for rectal bleeding.  She took suppositories in the past with limited success. Despite medical therapy she continues to have frequent blood in the toilet tissue and in the water and complains of rectal discomfort. She has leakage and difficulty with rectal hygiene.  Last colonoscopy in 2011 demonstrated adenomatous polyps which were removed.    Past Medical History  Diagnosis Date  . CAD (coronary artery disease)     50% proximal LAD stenosis, 50% marginal stenosis of the circumflex, RCA 95% stenosis, EF 40% stenosis with hypokinesis of the interior wall.  She had a Taxus stent placed in 04/16/2006  . Hypertension   . Abdominal aortic aneurysm   . Renal artery stenosis     S/P stenting of the right renal atrophic left renal  . Dyslipidemia   . Hypothyroidism   . Tobacco user     Previous  . Adenomatous polyp 2004, 2011  . Hyperplastic colon polyp 2004  . Hemorrhoids   . Diverticulosis   . Renal insufficiency   . Anemia    Past Surgical History  Procedure Date  . Abdominal hysterectomy   . Carpal tunnel release   . Cholecystectomy   . Repair right common femoral artery    family history includes Heart attack (age of onset:42) in her brother; Heart attack (age of onset:58) in her father; Other in her sister; Peripheral vascular disease in her sisters; and Uterine cancer in her mother.  There is no history of Colon cancer. Current Outpatient Prescriptions  Medication Sig Dispense Refill  . aspirin 81 MG tablet Take 81 mg by mouth daily.        . citalopram (CELEXA) 20 MG tablet 1 tab daily      . clopidogrel (PLAVIX) 75 MG tablet TAKE ONE TABLET BY MOUTH ONE TIME DAILY  30 tablet  4  . furosemide (LASIX) 40 MG tablet Take 0.5 tablets (20 mg  total) by mouth daily. 1 tab daily      . hydrocortisone (ANUSOL-HC) 25 MG suppository Place 25 mg rectally at bedtime.        . levothyroxine (SYNTHROID, LEVOTHROID) 50 MCG tablet Take 50 mcg by mouth daily.        . NIFEDICAL XL 60 MG 24 hr tablet TAKE ONE TABLET BY MOUTH ONE TIME DAILY  30 tablet  5  . NITROSTAT 0.4 MG SL tablet DISSOLVE 1 TABLET UNDER TONGUE EVERY 5 MINUTES FOR 3 DOSES FOR CHESTPAIN  25 each  7  . Omega-3-acid Ethyl Esters (LOVAZA PO) Take 2 mg by mouth daily.        . Pitavastatin Calcium (LIVALO) 2 MG TABS Take by mouth. 1 tab daily       . senna (SENOKOT) 8.6 MG tablet 1 tab every other day        Allergies as of 02/10/2012 - Review Complete 02/10/2012  Allergen Reaction Noted  . Sulfonamide derivatives      reports that she quit smoking about 5 years ago. Her smoking use included Cigarettes. She quit after 1 year of use. She has never used smokeless tobacco. She reports that she does not drink alcohol or use illicit drugs.     Review of Systems: Pertinent positive and negative review of systems were noted in the above HPI section.   All other review of systems were otherwise negative.  Vital signs were reviewed in today's medical record Physical Exam: General: Well developed , well nourished, no acute distress Head: Normocephalic and atraumatic Eyes:  sclerae anicteric, EOMI Ears: Normal auditory acuity Mouth: No deformity or lesions Neck: Supple, no masses or thyromegaly Lungs: Clear throughout to auscultation Heart: Regular rate and rhythm; no murmurs, rubs or bruits Abdomen: Soft, non tender and non distended. No masses, hepatosplenomegaly or hernias noted. Normal Bowel sounds Rectal: There are large external skin tag Musculoskeletal: Symmetrical with no gross deformities  Skin: No lesions on visible extremities Pulses:  Normal pulses noted Extremities: No clubbing, cyanosis, edema or deformities noted Neurological: Alert oriented x 4, grossly  nonfocal Cervical Nodes:  No significant cervical adenopathy Inguinal Nodes: No significant inguinal adenopathy Psychological:  Alert and cooperative. Normal mood and affect       

## 2012-02-10 NOTE — Telephone Encounter (Signed)
This is a Dr Antoine Poche pt, I will forward to DR/NURSE for advise.

## 2012-02-10 NOTE — Telephone Encounter (Signed)
[facility] [phone] phone [phone] fax   02/10/2012    RE: [name] DOB: Feb 28, 1935 MRN: [id number]   Dear  Dr Elease Hashimoto,   We have scheduled the above patient for an endoscopic procedure. Our records show that she is on anticoagulation therapy.   Please advise as to how long the patient may come off her therapy of Plavix prior to the procedure, which is scheduled for 02/25/2012.  Please fax back/ or route the completed form to Tod Abrahamsen at [phone].   Sincerely,  Merri Ray

## 2012-02-10 NOTE — Assessment & Plan Note (Signed)
Plan follow-up colonoscopy 2016 

## 2012-02-10 NOTE — Assessment & Plan Note (Addendum)
Rectal pain and bleeding is very likely due to hemorrhoids. Bleeding is most likely from internal hemorrhoids.  I explained that band ligation may mitigate some of the symptoms. Her external skin tags are likely contributing to her difficulty with hygiene and possibly bleeding.  Recommendations #1 band ligation of internal hemorrhoids then reassess

## 2012-02-18 NOTE — Telephone Encounter (Signed)
OK to hold Plavix as needed for the colonoscopy.

## 2012-02-20 NOTE — Telephone Encounter (Signed)
Message left for this patient ok to hold plavix

## 2012-02-25 ENCOUNTER — Encounter (HOSPITAL_COMMUNITY)
Admission: RE | Disposition: A | Discharge status: Home / Self Care | Payer: Self-pay | Source: Ambulatory Visit | Attending: Gastroenterology

## 2012-02-25 ENCOUNTER — Encounter (HOSPITAL_COMMUNITY): Payer: Self-pay | Admitting: *Deleted

## 2012-02-25 ENCOUNTER — Ambulatory Visit (HOSPITAL_COMMUNITY)
Admission: RE | Admit: 2012-02-25 | Discharge: 2012-02-25 | Disposition: A | Discharge status: Home / Self Care | Payer: Medicare Other | Source: Ambulatory Visit | Attending: Gastroenterology | Admitting: Gastroenterology

## 2012-02-25 DIAGNOSIS — E039 Hypothyroidism, unspecified: Secondary | ICD-10-CM | POA: Insufficient documentation

## 2012-02-25 DIAGNOSIS — Z7982 Long term (current) use of aspirin: Secondary | ICD-10-CM | POA: Insufficient documentation

## 2012-02-25 DIAGNOSIS — Z79899 Other long term (current) drug therapy: Secondary | ICD-10-CM | POA: Insufficient documentation

## 2012-02-25 DIAGNOSIS — K648 Other hemorrhoids: Secondary | ICD-10-CM

## 2012-02-25 DIAGNOSIS — K625 Hemorrhage of anus and rectum: Secondary | ICD-10-CM

## 2012-02-25 DIAGNOSIS — E785 Hyperlipidemia, unspecified: Secondary | ICD-10-CM | POA: Insufficient documentation

## 2012-02-25 DIAGNOSIS — Z86010 Personal history of colonic polyps: Secondary | ICD-10-CM

## 2012-02-25 DIAGNOSIS — I10 Essential (primary) hypertension: Secondary | ICD-10-CM | POA: Insufficient documentation

## 2012-02-25 HISTORY — DX: Acute myocardial infarction, unspecified: I21.9

## 2012-02-25 HISTORY — PX: FLEXIBLE SIGMOIDOSCOPY: SHX5431

## 2012-02-25 HISTORY — PX: HEMORRHOID BANDING: SHX5850

## 2012-02-25 SURGERY — SIGMOIDOSCOPY, FLEXIBLE
Anesthesia: Moderate Sedation

## 2012-02-25 MED ORDER — FENTANYL CITRATE 0.05 MG/ML IJ SOLN
INTRAMUSCULAR | Status: DC | PRN
  Administered 2012-02-25 (×3): 25 ug via INTRAVENOUS

## 2012-02-25 MED ORDER — MIDAZOLAM HCL 10 MG/2ML IJ SOLN
INTRAMUSCULAR | Status: AC
  Filled 2012-02-25: qty 2

## 2012-02-25 MED ORDER — MIDAZOLAM HCL 10 MG/2ML IJ SOLN
INTRAMUSCULAR | Status: DC | PRN
  Administered 2012-02-25 (×2): 2.5 mg via INTRAVENOUS

## 2012-02-25 MED ORDER — FENTANYL CITRATE 0.05 MG/ML IJ SOLN
INTRAMUSCULAR | Status: AC
  Filled 2012-02-25: qty 2

## 2012-02-25 MED ORDER — SODIUM CHLORIDE 0.9 % IV SOLN
INTRAVENOUS | Status: DC
  Administered 2012-02-25: 500 mL via INTRAVENOUS

## 2012-02-25 NOTE — Op Note (Signed)
[facility] [address] [place], [address]   FLEXIBLE SIGMOIDOSCOPY PROCEDURE REPORT  PATIENT: [name], [name]  MR#: [id number] BIRTHDATE: [birth year] , 77  yrs. old GENDER: Female ENDOSCOPIST: Louis Meckel, MD REFERRED BY: Joette Catching, M.D. PROCEDURE DATE:  02/25/2012 PROCEDURE:   Hemorrhoidectomy via banding, clips or ligation ASA CLASS:   Class III INDICATIONS:therapy of for previously diagnosed hemorrhoids. MEDICATIONS: Versed 5 mg IV and Fentanyl 75 mcg IV  DESCRIPTION OF PROCEDURE:   After the risks benefits and alternatives of the procedure were thoroughly explained, informed consent was obtained.  revealed external hemorrhoids. The [id number] ([id number])  endoscope was introduced through the anus  and advanced to the sigmoid colon , limited by No adverse events experienced. The quality of the prep was    .  The instrument was then slowly withdrawn as the mucosa was fully examined.       COLON FINDINGS: Hemorrhoids were found.  3 large, friable hemorrhoidal bundles were noted. In the retroflexed position 5 band ligator is placed just above the dentate line.  The remainder of the exam is normal. The scope was then withdrawn from the patient and the procedure terminated.  COMPLICATIONS: There were no complications.  ENDOSCOPIC IMPRESSION: bleeding internal hemorrhoids-status post band ligation RECOMMENDATIONS:  resume Plavix in 5 days Office visit one month  REPEAT EXAM:   _______________________________ eSignedLouis Meckel, MD 02/25/2012 11:19 AM   CC:

## 2012-02-25 NOTE — H&P (View-Only) (Signed)
History of Present Illness: Pleasant 77 year old white female with history of colon polyps and hemorrhoids referred for evaluation of rectal pain and bleeding. She's had similar complaints over the years and was last evaluated almost one year ago for rectal bleeding.  She took suppositories in the past with limited success. Despite medical therapy she continues to have frequent blood in the toilet tissue and in the water and complains of rectal discomfort. She has leakage and difficulty with rectal hygiene.  Last colonoscopy in 2011 demonstrated adenomatous polyps which were removed.    Past Medical History  Diagnosis Date  . CAD (coronary artery disease)     50% proximal LAD stenosis, 50% marginal stenosis of the circumflex, RCA 95% stenosis, EF 40% stenosis with hypokinesis of the interior wall.  She had a Taxus stent placed in 04/16/2006  . Hypertension   . Abdominal aortic aneurysm   . Renal artery stenosis     S/P stenting of the right renal atrophic left renal  . Dyslipidemia   . Hypothyroidism   . Tobacco user     Previous  . Adenomatous polyp 2004, 2011  . Hyperplastic colon polyp 2004  . Hemorrhoids   . Diverticulosis   . Renal insufficiency   . Anemia    Past Surgical History  Procedure Date  . Abdominal hysterectomy   . Carpal tunnel release   . Cholecystectomy   . Repair right common femoral artery    family history includes Heart attack (age of onset:42) in her brother; Heart attack (age of onset:58) in her father; Other in her sister; Peripheral vascular disease in her sisters; and Uterine cancer in her mother.  There is no history of Colon cancer. Current Outpatient Prescriptions  Medication Sig Dispense Refill  . aspirin 81 MG tablet Take 81 mg by mouth daily.        . citalopram (CELEXA) 20 MG tablet 1 tab daily      . clopidogrel (PLAVIX) 75 MG tablet TAKE ONE TABLET BY MOUTH ONE TIME DAILY  30 tablet  4  . furosemide (LASIX) 40 MG tablet Take 0.5 tablets (20 mg  total) by mouth daily. 1 tab daily      . hydrocortisone (ANUSOL-HC) 25 MG suppository Place 25 mg rectally at bedtime.        Marland Kitchen levothyroxine (SYNTHROID, LEVOTHROID) 50 MCG tablet Take 50 mcg by mouth daily.        Marland Kitchen NIFEDICAL XL 60 MG 24 hr tablet TAKE ONE TABLET BY MOUTH ONE TIME DAILY  30 tablet  5  . NITROSTAT 0.4 MG SL tablet DISSOLVE 1 TABLET UNDER TONGUE EVERY 5 MINUTES FOR 3 DOSES FOR CHESTPAIN  25 each  7  . Omega-3-acid Ethyl Esters (LOVAZA PO) Take 2 mg by mouth daily.        . Pitavastatin Calcium (LIVALO) 2 MG TABS Take by mouth. 1 tab daily       . senna (SENOKOT) 8.6 MG tablet 1 tab every other day        Allergies as of 02/10/2012 - Review Complete 02/10/2012  Allergen Reaction Noted  . Sulfonamide derivatives      reports that she quit smoking about 5 years ago. Her smoking use included Cigarettes. She quit after 1 year of use. She has never used smokeless tobacco. She reports that she does not drink alcohol or use illicit drugs.     Review of Systems: Pertinent positive and negative review of systems were noted in the above HPI section.  All other review of systems were otherwise negative.  Vital signs were reviewed in today's medical record Physical Exam: General: Well developed , well nourished, no acute distress Head: Normocephalic and atraumatic Eyes:  sclerae anicteric, EOMI Ears: Normal auditory acuity Mouth: No deformity or lesions Neck: Supple, no masses or thyromegaly Lungs: Clear throughout to auscultation Heart: Regular rate and rhythm; no murmurs, rubs or bruits Abdomen: Soft, non tender and non distended. No masses, hepatosplenomegaly or hernias noted. Normal Bowel sounds Rectal: There are large external skin tag Musculoskeletal: Symmetrical with no gross deformities  Skin: No lesions on visible extremities Pulses:  Normal pulses noted Extremities: No clubbing, cyanosis, edema or deformities noted Neurological: Alert oriented x 4, grossly  nonfocal Cervical Nodes:  No significant cervical adenopathy Inguinal Nodes: No significant inguinal adenopathy Psychological:  Alert and cooperative. Normal mood and affect

## 2012-02-25 NOTE — Interval H&P Note (Signed)
History and Physical Interval Note:  02/25/2012 10:30 AM  [name]  has presented today for surgery, with the diagnosis of hemorrhoids  The various methods of treatment have been discussed with the patient and family. After consideration of risks, benefits and other options for treatment, the patient has consented to  Procedure(s) (LRB) with comments: FLEXIBLE SIGMOIDOSCOPY (N/A) HEMORRHOID BANDING (N/A) as a surgical intervention .  The patient's history has been reviewed, patient examined, no change in status, stable for surgery.  I have reviewed the patient's chart and labs.  Questions were answered to the patient's satisfaction.    The recent H&P (dated *02/10/12**) was reviewed, the patient was examined and there is no change in the patients condition since that H&P was completed.   Melvia Heaps  02/25/2012, 10:30 AM    Melvia Heaps

## 2012-02-26 ENCOUNTER — Telehealth: Payer: Self-pay | Admitting: Gastroenterology

## 2012-02-26 ENCOUNTER — Encounter (HOSPITAL_COMMUNITY): Payer: Self-pay | Admitting: Gastroenterology

## 2012-02-26 NOTE — Telephone Encounter (Signed)
Pt was given the recommendation that Dr Arlyce Dice instructed on the bottom of the procedure report to restart her plavix in 5 days.

## 2012-03-14 ENCOUNTER — Other Ambulatory Visit: Payer: Self-pay | Admitting: Cardiology

## 2012-04-05 ENCOUNTER — Encounter: Payer: Self-pay | Admitting: Gastroenterology

## 2012-04-05 ENCOUNTER — Ambulatory Visit (INDEPENDENT_AMBULATORY_CARE_PROVIDER_SITE_OTHER): Payer: Medicare Other | Admitting: Gastroenterology

## 2012-04-05 VITALS — BP 128/60 | HR 67 | Ht 62.5 in | Wt 140.2 lb

## 2012-04-05 DIAGNOSIS — K648 Other hemorrhoids: Secondary | ICD-10-CM

## 2012-04-05 NOTE — Progress Notes (Signed)
History of Present Illness:  Patient has returned following band ligation of internal hemorrhoids. Bleeding has stopped. Her only complaint is some difficulty cleaning herself.    Review of Systems: Pertinent positive and negative review of systems were noted in the above HPI section. All other review of systems were otherwise negative.    Current Medications, Allergies, Past Medical History, Past Surgical History, Family History and Social History were reviewed in [employer] [facility] electronic medical record  Vital signs were reviewed in today's medical record. Physical Exam: General: Well developed , well nourished, no acute distress On rectal exam there are large external skin tags

## 2012-04-05 NOTE — Patient Instructions (Addendum)

## 2012-04-05 NOTE — Assessment & Plan Note (Signed)
Patient has had a good response to band ligation. Problems with hygiene are due 2 skin tags.  Recommendations #1 Tucks pads #2 if the patient desires I will refer her to surgery for excision of the skin tags

## 2012-05-17 ENCOUNTER — Encounter: Payer: Self-pay | Admitting: Vascular Surgery

## 2012-05-18 ENCOUNTER — Encounter: Payer: Self-pay | Admitting: Vascular Surgery

## 2012-05-18 ENCOUNTER — Ambulatory Visit: Payer: Medicare Other | Admitting: Vascular Surgery

## 2012-05-18 ENCOUNTER — Encounter (INDEPENDENT_AMBULATORY_CARE_PROVIDER_SITE_OTHER): Payer: Medicare Other | Admitting: *Deleted

## 2012-05-18 ENCOUNTER — Ambulatory Visit (INDEPENDENT_AMBULATORY_CARE_PROVIDER_SITE_OTHER): Payer: Medicare Other | Admitting: Vascular Surgery

## 2012-05-18 VITALS — BP 138/92 | HR 79 | Ht 62.5 in | Wt 138.7 lb

## 2012-05-18 DIAGNOSIS — I714 Abdominal aortic aneurysm, without rupture, unspecified: Secondary | ICD-10-CM

## 2012-05-18 NOTE — Addendum Note (Signed)
Addended by: Sharee Pimple on: 05/18/2012 01:01 PM   Modules accepted: Orders

## 2012-05-18 NOTE — Progress Notes (Signed)
Subjective:     Patient ID: [name], female   DOB: Jun 10, 1934, 78 y.o.   MRN: [id number]  HPIthis 78 year old female returns for further followup regarding her abdominal aortic aneurysm. I last saw her one year ago in maximum diameter was 4.7 cm. Today she had an ultrasound performed in our office in the maximum diameter is 4.9 cm. She has chronic renal insufficiency with creatinine in the low threes in the past but she states that the level is lower now. She is followed by Dr. Casimiro Needle. She has also been evaluated at [facility] for possible renal transplantation in the future but obviously with stable renal function she is not on any active schedule for transplantation. Previous CT scan which I was unable to find today on the computer that she did have some angulation of her neck but was a stent graft candidate. Complicating factor obviously is in need to give contrast for the aortic stent graft. Having discussed this with her in the past we have elected to continue to follow this aneurysm which was not of the size necessitating repair at the last visit.  Past Medical History  Diagnosis Date  . CAD (coronary artery disease)     50% proximal LAD stenosis, 50% marginal stenosis of the circumflex, RCA 95% stenosis, EF 40% stenosis with hypokinesis of the interior wall.  She had a Taxus stent placed in 04/16/2006  . Hypertension   . Abdominal aortic aneurysm   . Renal artery stenosis     S/P stenting of the right renal atrophic left renal  . Dyslipidemia   . Hypothyroidism   . Tobacco user     Previous  . Adenomatous polyp 2004, 2011  . Hyperplastic colon polyp 2004  . Hemorrhoids   . Diverticulosis   . Renal insufficiency   . Anemia   . Myocardial infarction     History  Substance Use Topics  . Smoking status: Former Smoker -- 1 years    Types: Cigarettes    Quit date: 03/17/2006  . Smokeless tobacco: Never Used  . Alcohol Use: No    Family History  Problem Relation Age  of Onset  . Uterine cancer Mother   . Heart attack Father 34  . Heart disease Father     before age 26  . Peripheral vascular disease Sister   . Other Sister     Renal artery stenosis  . Heart disease Sister     before age 61  . Heart attack Brother 42  . Heart disease Brother     before age 61  . Peripheral vascular disease Sister   . Colon cancer Neg Hx     Allergies  Allergen Reactions  . Sulfonamide Derivatives     Current outpatient prescriptions:aspirin 81 MG tablet, Take 81 mg by mouth daily.  , Disp: , Rfl: ;  citalopram (CELEXA) 20 MG tablet, 1 tab daily, Disp: , Rfl: ;  clopidogrel (PLAVIX) 75 MG tablet, TAKE ONE TABLET BY MOUTH ONE TIME DAILY, Disp: 30 tablet, Rfl: 1;  furosemide (LASIX) 40 MG tablet, Take 0.5 tablets (20 mg total) by mouth daily. 1 tab daily, Disp: , Rfl:  hydrocortisone (ANUSOL-HC) 25 MG suppository, Place 25 mg rectally at bedtime. , Disp: , Rfl: ;  levothyroxine (SYNTHROID, LEVOTHROID) 50 MCG tablet, Take 50 mcg by mouth daily.  , Disp: , Rfl: ;  NIFEDICAL XL 60 MG 24 hr tablet, TAKE ONE TABLET BY MOUTH ONE TIME DAILY, Disp: 30 tablet, Rfl:  5;  Omega-3-acid Ethyl Esters (LOVAZA PO), Take 2 mg by mouth daily.  , Disp: , Rfl:  Pitavastatin Calcium (LIVALO) 2 MG TABS, Take by mouth. 1 tab daily , Disp: , Rfl: ;  pravastatin (PRAVACHOL) 80 MG tablet, Take 1 tablet by mouth daily., Disp: , Rfl: ;  senna (SENOKOT) 8.6 MG tablet, 1 tab every other day , Disp: , Rfl: ;  NITROSTAT 0.4 MG SL tablet, DISSOLVE 1 TABLET UNDER TONGUE EVERY 5 MINUTES FOR 3 DOSES FOR CHESTPAIN, Disp: 25 each, Rfl: 7  BP 138/92  Pulse 79  Ht 5' 2.5" (1.588 m)  Wt 138 lb 11.2 oz (62.914 kg)  BMI 24.95 kg/m2  SpO2 100%  Body mass index is 24.95 kg/(m^2).           Review of SystemsDenies chest pain but does have dyspnea on exertion. Ambulation is limited by fatigue. No claudication symptoms. Denies lateralizing weakness, aphasia, amaurosis fugax, or syncope.     Objective:    Physical Exam blood pressure 130/92 heart rate 79 respirations 18 Gen.-alert and oriented x3 in no apparent distress HEENT normal for age Lungs no rhonchi or wheezing Cardiovascular regular rhythm no murmurs carotid pulses 3+ palpable no bruits audible Abdomen soft nontender -Approximate 5 cm pulsatile mass in the midepigastrium. Musculoskeletal free of  major deformities Skin clear -no rashes Neurologic normal Lower extremities 3+ femoral and dorsalis pedis pulses palpable bilaterally with no edema  Today I ordered a duplex scan areola which are reviewed and interpreted. Maximum diameters 4.9 cm.       Assessment:     Infrarenal abdominal aortic aneurysm 4.9 cm maximum diameter-slight increase in size Chronic renal insufficiency-not on hemodialysis     Plan:     Will continue to follow this aneurysm which is slowly enlarging. She will return in 6 months with a noncontrast CT of the abdomen and pelvis. If there is further enlargement she will probably require treatment at that time which may be most safely done with open repair given her renal function. Discussed  potential for rupture with her and she would like to forego treatment at this time and think about it for the next 6 months

## 2012-06-09 ENCOUNTER — Other Ambulatory Visit: Payer: Self-pay | Admitting: Cardiology

## 2012-06-11 ENCOUNTER — Telehealth: Payer: Self-pay | Admitting: Cardiology

## 2012-06-11 NOTE — Telephone Encounter (Signed)
[facility]VD CALL FROM PHARM.  ASKING FOR SCRIPT STATED THEY FAXED.  VERIFIED FAX NUMBER GOING TO [address].  REQUESTED FAX TO OUR OFFICE.

## 2012-06-11 NOTE — Telephone Encounter (Signed)
GENERIC PROCARDIA 60MG  REQUESTED BY [facility].

## 2012-06-16 ENCOUNTER — Telehealth: Payer: Self-pay

## 2012-06-16 NOTE — Telephone Encounter (Signed)
Pt stopped by the office today and states that Dr. Arlyce Dice told her if she continued to have problems with her hemorrhoids we could refer her to a surgeon. Pt is requesting we refer her to a surgeon. Please advise.

## 2012-06-17 ENCOUNTER — Other Ambulatory Visit: Payer: Self-pay | Admitting: Gastroenterology

## 2012-06-17 ENCOUNTER — Other Ambulatory Visit: Payer: Self-pay | Admitting: Cardiology

## 2012-06-17 DIAGNOSIS — K649 Unspecified hemorrhoids: Secondary | ICD-10-CM

## 2012-06-17 NOTE — Telephone Encounter (Signed)
Send to [facility]

## 2012-06-17 NOTE — Telephone Encounter (Signed)
Referral sent to [facility], waiting to hear regarding appt.

## 2012-06-17 NOTE — Telephone Encounter (Signed)
Pt scheduled to see Dr. Maisie Fus with [facility] 07/06/12. Pt to arrive at 10:50am for an 11:20am appt. Left message for pt to call back.

## 2012-06-18 NOTE — Telephone Encounter (Signed)
Spoke with pt and she is aware of appt date and time. 

## 2012-07-06 ENCOUNTER — Encounter (INDEPENDENT_AMBULATORY_CARE_PROVIDER_SITE_OTHER): Payer: Self-pay | Admitting: General Surgery

## 2012-07-06 ENCOUNTER — Ambulatory Visit (INDEPENDENT_AMBULATORY_CARE_PROVIDER_SITE_OTHER): Payer: Medicare Other | Admitting: General Surgery

## 2012-07-06 VITALS — BP 132/84 | HR 68 | Temp 98.4°F | Resp 16 | Ht 65.0 in | Wt 137.0 lb

## 2012-07-06 DIAGNOSIS — K644 Residual hemorrhoidal skin tags: Secondary | ICD-10-CM

## 2012-07-06 NOTE — Patient Instructions (Signed)
Bowel Incontinence  What is incontinence? Incontinence is the impaired ability to control gas or stool. Its severity ranges from mild difficulty with gas control to severe loss of control over liquid and formed stools. Incontinence to stool is a common problem, but often it is not discussed due to embarassment. What causes incontinence? There are many causes of incontinence. Injury during childbirth is one of the most common causes. These injuries may cause a tear in the anal muscles. The nerves supplying the anal muscles may also be injured. While some injuries may be recognized immediately following childbirth, many others may go unnoticed and not become a problem until later in life. In these situations, a prior childbirth may not be recognized as the cause of incontinence. Anal operations or traumatic injury to the tissue surrounding the anal region similarly can damage the anal muscles and hinder bowel control. Some individuals experience loss of strength in the anal muscles as they age. As a result, a minor control problem in a younger person may become more significant later in life. Diarrhea may be associated with a feeling of urgency or stool leakage due to the frequent liquid stools passing through the anal opening. If bleeding accompanies your bowel movements or you have lack of bowel control, consult your physician. These symptoms may indicate inflammation within the colon (colitis), a rectal tumor or rectal prolapse - all conditions that require prompt evaluation by a physician.    How is the cause of incontinence determined? An initial discussion of the problem with your physician will help establish the degree of control difficulty and its impact on your lifestyle. Many clues to the origin of incontinence may be found in patient histories. For example, a woman's history of past childbirths is very important. Multiple pregnancies, large weight babies, forceps deliveries, or episiotomies  may contribute to muscle or nerve injury at the time of childbirth. In some cases, medical illnesses and medications play a role in problems with control. A physical exam of the anal region should be performed. It may readily identify an obvious injury to the anal muscles. In addition, an ultrasound probe can be used within the anal area to provide a picture of the muscles and show areas in which the anal muscles have been injured. Frequently, additional studies are required to define the anal area more completely. In a test called anal manometry, a small catheter is placed into the anus to record pressure as patients relax and tighten the anal muscles. This test can demonstrate how weak or strong the muscle really is. A separate test may also be conducted to determine if the nerves that go to the anal muscles are functioning properly. What can be done to correct the problem? Treatment of incontinence may include: .     Dietary changes .     Constipating medications .     Muscle strengthening exercises .     Biofeedback  Injectable bulking agents  Surgical muscle repair  Artificial anal sphincter  Sacral nerve stimulator  After a careful history, physical examination and testing to determine the cause and severity of the problem, treatment can be addressed. Mild problems may be treated very simply with dietary changes and the use of some constipating medications. Diseases which cause inflammation in the rectum, such as colitis, may contribute to anal control problems. Treating these diseases also may eliminate or improve symptoms of incontinence. Sometimes a change in prescribed medications may help. Your physician also may recommend simple home exercises that may   strengthen the anal muscles to help in mild cases. A type of physical therapy called biofeedback can be used to help patients sense when stool is ready to be evacuated and help strengthen the muscles. Injuries to the anal muscles may be  repaired with surgery. Some individuals may benefit from a technique that delivers electrical energy to the skin and muscles surrounding the anus which results in firming and thickening of this area to help with continence. In certain individuals that have nerve damage or anal muscles that are damaged beyond repair, an artificial sphincter may be implanted. The artificial sphincter is a plastic, fluid filled doughnut that is surgically implanted around the damaged anal sphincter. This artificial sphincter keeps the anal canal closed. When an individual wants to have a bowel movement, the fluid can be pumped out of the doughnut to allow the anal canal to open. In extreme cases, patients may find that a colostomy is the best option for improving their quality of life. What is a colon and rectal surgeon? Colon and rectal surgeons are experts in the surgical and non-surgical treatment of diseases of the colon, rectum and anus. They have completed advanced surgical training in the treatment of these diseases as well as full general surgical training. Board-certified colon and rectal surgeons complete residencies in general surgery and colon and rectal surgery, and pass intensive examinations conducted by the American Board of Surgery and the American Board of Colon and Rectal Surgery. They are well-versed in the treatment of both benign and malignant diseases of the colon, rectum and anus and are able to perform routine screening examinations and surgically treat conditions if indicated to do so.  2012 American Society of Colon & Rectal Surgeons     Fiber Chart  You should 25-30g of fiber per day and drinking 8 glasses of water to help your bowels move regularly.  In the chart below you can look up how much fiber you are getting in an average day.  If you are not getting enough fiber, you should add a fiber supplement to your diet.  Examples of this include Metamucil, FiberCon and Citrucel.  These can be  purchased at your local grocery store or pharmacy.      [email or web address]   GETTING TO GOOD BOWEL HEALTH. Irregular bowel habits such as constipation can lead to many problems over time.  Having one soft bowel movement a day is the most important way to prevent further problems.  The anorectal canal is designed to handle stretching and feces to safely manage our ability to get rid of solid waste (feces, poop, stool) out of our body.  BUT, hard constipated stools can act like ripping concrete bricks causing inflamed hemorrhoids, anal fissures, abdominal pain and bloating.     The goal: ONE SOFT BOWEL MOVEMENT A DAY!  To have soft, regular bowel movements:    Drink at least 8 tall glasses of water a day.     Take plenty of fiber.  Fiber is the undigested part of plant food that passes into the colon, acting s "natures broom" to encourage bowel motility and movement.  Fiber can absorb and hold large amounts of water. This results in a larger, bulkier stool, which is soft and easier to pass. Work gradually over several weeks up to 6 servings a day of fiber (25g a day even more if needed) in the form of: o Vegetables -- Root (potatoes, carrots, turnips), leafy green (lettuce, salad greens, celery, spinach), or cooked high residue (  cabbage, broccoli, etc) o Fruit -- Fresh (unpeeled skin & pulp), Dried (prunes, apricots, cherries, etc ),  or stewed ( applesauce)  o Whole grain breads, pasta, etc (whole wheat)  o Bran cereals    Bulking Agents -- This type of water-retaining fiber generally is easily obtained each day by one of the following:  o Psyllium bran -- The psyllium plant is remarkable because its ground seeds can retain so much water. This product is available as Metamucil, Konsyl, Effersyllium, Per Diem Fiber, or the less expensive generic preparation in drug and health food stores. Although labeled a laxative, it really is not a laxative.   o Methylcellulose -- This is another fiber derived from wood which also retains water. It is available as Citrucel. o Polyethylene Glycol - and "artificial" fiber commonly called Miralax or Glycolax.  It is helpful for people with gassy or bloated feelings with regular fiber o Flax Seed - a less gassy fiber than psyllium

## 2012-07-06 NOTE — Progress Notes (Signed)
Chief Complaint  Patient presents with  . New Evaluation    eval hems    HISTORY: [name] is a 78 y.o. female who presents to the office with anal skin tags.  Other symptoms include inability to clean the area.  She denies any itching or drainage.  This had been occurring since her hemorrhoid banding in Nov.  She has tried tucks pads and sitz baths in the past with some success.  She denies anal bleeding.  Nothing makes the symptoms worse.   It is continuous in nature.  her bowel habits are regular and her bowel movements are sometimes hard.  She has occasional straining.  Her fiber intake is minimal.  Her last colonoscopy was in 2011 and demonstrated adenomatous polyps which were removed. .    Past Medical History  Diagnosis Date  . CAD (coronary artery disease)     50% proximal LAD stenosis, 50% marginal stenosis of the circumflex, RCA 95% stenosis, EF 40% stenosis with hypokinesis of the interior wall.  She had a Taxus stent placed in 04/16/2006  . Hypertension   . Abdominal aortic aneurysm   . Renal artery stenosis     S/P stenting of the right renal atrophic left renal  . Dyslipidemia   . Hypothyroidism   . Tobacco user     Previous  . Adenomatous polyp 2004, 2011  . Hyperplastic colon polyp 2004  . Hemorrhoids   . Diverticulosis   . Renal insufficiency   . Anemia   . Myocardial infarction       Past Surgical History  Procedure Laterality Date  . Abdominal hysterectomy    . Carpal tunnel release    . Cholecystectomy    . Flexible sigmoidoscopy  02/25/2012    Procedure: FLEXIBLE SIGMOIDOSCOPY;  Surgeon: Louis Meckel, MD;  Location: [facility];  Service: Endoscopy;  Laterality: N/A;  . Hemorrhoid banding  02/25/2012    Procedure: HEMORRHOID BANDING;  Surgeon: Louis Meckel, MD;  Location: [facility];  Service: Endoscopy;  Laterality: N/A;        Current Outpatient Prescriptions  Medication Sig Dispense Refill  . aspirin 81 MG tablet Take 81 mg by mouth  daily.        . citalopram (CELEXA) 20 MG tablet 1 tab daily      . clopidogrel (PLAVIX) 75 MG tablet TAKE 1 TABLET EVERY DAY  30 tablet  0  . furosemide (LASIX) 40 MG tablet Take 0.5 tablets (20 mg total) by mouth daily. 1 tab daily      . hydrocortisone (ANUSOL-HC) 25 MG suppository Place 25 mg rectally at bedtime.       [name] levothyroxine (SYNTHROID, LEVOTHROID) 50 MCG tablet Take 50 mcg by mouth daily.        [name] NIFEdipine (PROCARDIA XL/ADALAT-CC) 60 MG 24 hr tablet TAKE 1 TABLET DAILY  30 tablet  1  . NITROSTAT 0.4 MG SL tablet DISSOLVE 1 TABLET UNDER TONGUE EVERY 5 MINUTES FOR 3 DOSES FOR CHESTPAIN  25 each  7  . Omega-3-acid Ethyl Esters (LOVAZA PO) Take 2 mg by mouth daily.        . Pitavastatin Calcium (LIVALO) 2 MG TABS Take by mouth. 1 tab daily       . pravastatin (PRAVACHOL) 80 MG tablet Take 1 tablet by mouth daily.      [name] senna (SENOKOT) 8.6 MG tablet 1 tab every other day        No current facility-administered medications for this  visit.      Allergies  Allergen Reactions  . Sulfonamide Derivatives       Family History  Problem Relation Age of Onset  . Uterine cancer Mother   . Heart attack Father 52  . Heart disease Father     before age 44  . Peripheral vascular disease Sister   . Other Sister     Renal artery stenosis  . Heart disease Sister     before age 14  . Heart attack Brother 42  . Heart disease Brother     before age 25  . Cancer Brother     lung  . Peripheral vascular disease Sister   . Colon cancer Neg Hx   . Cancer Maternal Aunt     breast  . Cancer Maternal Aunt     breast    History   Social History  . Marital Status: Married    Spouse Name: N/A    Number of Children: 4  . Years of Education: N/A   Occupational History  . 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 Smoking status: Former Smoker -- 1 years    Types: Cigarettes    Quit date: 03/17/2006  . Smokeless tobacco: Never  Used  . Alcohol Use: No  . Drug Use: No  . Sexually Active: None   Other Topics Concern  . None   Social History Narrative  . None      REVIEW OF SYSTEMS - PERTINENT POSITIVES ONLY: Review of Systems - General ROS: negative for - chills, fever or weight loss Hematological and Lymphatic ROS: negative for - bleeding problems, blood clots or bruising Respiratory ROS: no cough, shortness of breath, or wheezing Cardiovascular ROS: no chest pain or dyspnea on exertion Gastrointestinal ROS: positive for - abdominal pain, constipation and gas/bloating Genito-Urinary ROS: no dysuria, trouble voiding, or hematuria  EXAM: Filed Vitals:   07/06/12 1122  BP: 132/84  Pulse: 68  Temp: 98.4 F (36.9 C)  Resp: 16    General appearance: alert and cooperative Resp: clear to auscultation bilaterally Cardio: regular rate and rhythm GI: soft, non-tender; bowel sounds normal; no masses,  no organomegaly   Procedure: Anoscopy Surgeon: [name] Diagnosis: anal irritation  Assistant: Glaspey After the risks and benefits were explained, verbal consent was obtained for above procedure  Anesthesia: none Findings: anterior prolapsed hemorrhoid/rectal mucosa with associated skin tag, grade 2 L lateral int hemorrhoid (nonbleeding).  She has minimal squeeze tone but adequate rectal tone. Normal push reflex    ASSESSMENT AND PLAN: [name] is a 78 y.o. F with hemorrhoids.  She is s/p banding about 6 months ago but still is having issues with external skin tags and inability to get clean.  On exam she has one main anterior skin tag with some minimally prolapsing rectal mucosa.  I think the inability to get clean has more to do with her incontinence and decreased external sphincter tone.  I have recommended against any hemorrhoid procedure at this time, as they are probably contributing to her continence.  I have also recommended that she increase her fiber intake to help with her incontinence.  I will  see her back as needed.       Krista Panda, MD Colon and Rectal Surgery / General Surgery [facility]      Visit Diagnoses: 1. Anal skin tag     Primary Care  Physician: Josue Hector, MD

## 2012-07-18 ENCOUNTER — Other Ambulatory Visit: Payer: Self-pay | Admitting: Cardiology

## 2012-08-10 ENCOUNTER — Other Ambulatory Visit: Payer: Self-pay | Admitting: Cardiology

## 2012-08-10 NOTE — Telephone Encounter (Signed)
..   Requested Prescriptions   Pending Prescriptions Disp Refills  . NIFEdipine (PROCARDIA XL/ADALAT-CC) 60 MG 24 hr tablet [Pharmacy Med Name: NIFEDIPINE ER 60 MG TABLET] 10 tablet 0    Sig: TAKE 1 TABLET DAILY

## 2012-08-13 ENCOUNTER — Ambulatory Visit: Payer: Medicare Other | Admitting: Cardiology

## 2012-08-25 ENCOUNTER — Ambulatory Visit (INDEPENDENT_AMBULATORY_CARE_PROVIDER_SITE_OTHER): Payer: Medicare Other | Admitting: Cardiology

## 2012-08-25 ENCOUNTER — Encounter: Payer: Self-pay | Admitting: Cardiology

## 2012-08-25 VITALS — BP 165/100 | HR 62 | Ht 65.0 in | Wt 140.8 lb

## 2012-08-25 DIAGNOSIS — I714 Abdominal aortic aneurysm, without rupture: Secondary | ICD-10-CM

## 2012-08-25 DIAGNOSIS — I2510 Atherosclerotic heart disease of native coronary artery without angina pectoris: Secondary | ICD-10-CM

## 2012-08-25 MED ORDER — NIFEDIPINE ER OSMOTIC RELEASE 60 MG PO TB24: ORAL_TABLET | ORAL | Status: DC

## 2012-08-25 NOTE — Patient Instructions (Addendum)
Please stop your Plavix. Continue all other medications as listed.  Follow up in 1 year with Dr Hochrein.  You will receive a letter in the mail 2 months before you are due.  Please call us when you receive this letter to schedule your follow up appointment.  

## 2012-08-25 NOTE — Progress Notes (Signed)
HPI The patient presents for follow up of low BPs.   Since I last saw her she says her renal function has stabilized though she still on the transplant list.  She has been followed for her slightly larger abdominal aneurysm. She's having some fatigue and decreased desire to perform her activities of daily living but she's not describing overt cardiovascular complaints. The patient denies any new symptoms such as chest discomfort, neck or arm discomfort. There has been no new shortness of breath, PND or orthopnea. There have been no reported palpitations, presyncope or syncope.  Allergies  Allergen Reactions  . Sulfonamide Derivatives     Current Outpatient Prescriptions  Medication Sig Dispense Refill  . aspirin 81 MG tablet Take 81 mg by mouth daily.        . citalopram (CELEXA) 20 MG tablet 1 tab daily      . furosemide (LASIX) 40 MG tablet Take 0.5 tablets (20 mg total) by mouth daily. 1 tab daily      . hydrocortisone (ANUSOL-HC) 25 MG suppository Place 25 mg rectally at bedtime.       Marland Kitchen levothyroxine (SYNTHROID, LEVOTHROID) 50 MCG tablet Take 50 mcg by mouth daily.        Marland Kitchen NITROSTAT 0.4 MG SL tablet DISSOLVE 1 TABLET UNDER TONGUE EVERY 5 MINUTES FOR 3 DOSES FOR CHESTPAIN  25 each  7  . Omega-3-acid Ethyl Esters (LOVAZA PO) Take 2 mg by mouth daily.        . Pitavastatin Calcium (LIVALO) 2 MG TABS Take by mouth. 1 tab daily       . pravastatin (PRAVACHOL) 80 MG tablet Take 1 tablet by mouth daily.      Marland Kitchen senna (SENOKOT) 8.6 MG tablet 1 tab every other day       . clopidogrel (PLAVIX) 75 MG tablet TAKE 1 TABLET EVERY DAY  30 tablet  0  . NIFEdipine (PROCARDIA XL/ADALAT-CC) 60 MG 24 hr tablet TAKE 1 TABLET DAILY  10 tablet  0   No current facility-administered medications for this visit.    Past Medical History  Diagnosis Date  . CAD (coronary artery disease)     50% proximal LAD stenosis, 50% marginal stenosis of the circumflex, RCA 95% stenosis, EF 40% stenosis with hypokinesis  of the interior wall.  She had a Taxus stent placed in 04/16/2006  . Hypertension   . Abdominal aortic aneurysm   . Renal artery stenosis     S/P stenting of the right renal atrophic left renal  . Dyslipidemia   . Hypothyroidism   . Tobacco user     Previous  . Adenomatous polyp 2004, 2011  . Hyperplastic colon polyp 2004  . Hemorrhoids   . Diverticulosis   . Renal insufficiency   . Anemia   . Myocardial infarction     Past Surgical History  Procedure Laterality Date  . Abdominal hysterectomy    . Carpal tunnel release    . Cholecystectomy    . Flexible sigmoidoscopy  02/25/2012    Procedure: FLEXIBLE SIGMOIDOSCOPY;  Surgeon: Louis Meckel, MD;  Location: [facility];  Service: Endoscopy;  Laterality: N/A;  . Hemorrhoid banding  02/25/2012    Procedure: HEMORRHOID BANDING;  Surgeon: Louis Meckel, MD;  Location: [facility];  Service: Endoscopy;  Laterality: N/A;    ROS:  As stated in the HPI and negative for all other systems.  PHYSICAL EXAM BP 165/100  Pulse 62  Ht 5\' 5"  (1.651 m)  Wt  140 lb 12.8 oz (63.866 kg)  BMI 23.43 kg/m2 GENERAL:  Well appearing NECK:  No jugular venous distention, waveform within normal limits, carotid upstroke brisk and symmetric, no bruits, no thyromegaly LUNGS:  Clear to auscultation bilaterally CHEST:  Unremarkable HEART:  PMI not displaced or sustained,S1 and S2 within normal limits, no S3, no S4, no clicks, no rubs, no murmurs ABD:  Flat, positive bowel sounds normal in frequency in pitch, no bruits, no rebound, no guarding, no midline pulsatile mass, no hepatomegaly, no splenomegaly EXT:  2 plus pulses throughout, no edema, no cyanosis no clubbing  EKG:  Sinus rhythm, rate 62, axis within normal limits, intervals within normal limits, no acute ST-T wave changes.  08/25/2012  ASSESSMENT AND PLAN  CAD:  The patient has no new sypmtoms .  No further cardiovascular testing is indicated.  We will continue with aggressive risk reduction  and meds as listed. She can stop her Plavix.  AAA:  This is slightly larger at 4.9 cm but being followed at [facility].   HTN: her blood pressure is elevated today but it turns out she was out of her nifedipine. We will renew this.

## 2012-09-02 ENCOUNTER — Telehealth: Payer: Self-pay | Admitting: Cardiology

## 2012-09-02 NOTE — Telephone Encounter (Signed)
Follow-up:    Patient called in stating that she found her discontinued medication.  She overlooked it on her last paperwork.

## 2012-09-02 NOTE — Telephone Encounter (Signed)
New problem    Pt can't remember which medication dr hochrein took her off of

## 2012-09-02 NOTE — Telephone Encounter (Signed)
plavix - pt is aware

## 2012-11-22 ENCOUNTER — Encounter: Payer: Self-pay | Admitting: Vascular Surgery

## 2012-11-23 ENCOUNTER — Ambulatory Visit
Admission: RE | Admit: 2012-11-23 | Discharge: 2012-11-23 | Disposition: A | Discharge status: Home / Self Care | Payer: Medicare HMO | Source: Ambulatory Visit | Attending: Vascular Surgery | Admitting: Vascular Surgery

## 2012-11-23 ENCOUNTER — Ambulatory Visit (INDEPENDENT_AMBULATORY_CARE_PROVIDER_SITE_OTHER): Payer: Medicare Other | Admitting: Vascular Surgery

## 2012-11-23 ENCOUNTER — Other Ambulatory Visit: Payer: Self-pay | Admitting: *Deleted

## 2012-11-23 ENCOUNTER — Encounter: Payer: Self-pay | Admitting: Vascular Surgery

## 2012-11-23 ENCOUNTER — Encounter: Payer: Self-pay | Admitting: *Deleted

## 2012-11-23 VITALS — BP 155/96 | HR 72 | Resp 18 | Ht 65.0 in | Wt 143.8 lb

## 2012-11-23 DIAGNOSIS — Z01810 Encounter for preprocedural cardiovascular examination: Secondary | ICD-10-CM

## 2012-11-23 DIAGNOSIS — I714 Abdominal aortic aneurysm, without rupture: Secondary | ICD-10-CM

## 2012-11-23 NOTE — Progress Notes (Signed)
Subjective:     Patient ID: [name], female   DOB: September 05, 1934, 78 y.o.   MRN: [id number]  HPI this 78 year old female returns for further discussion regarding her abdominal aortic aneurysm. She has known renal insufficiency with creatinine of 2.5 followed by Dr. Lowell Guitar. Her aneurysm has gradually enlarged and today on CT scan the aneurysm measures 5.2 cm in maximum diameter. Patient has no function in her left kidney and has had a previous stent in her right renal artery. She has been evaluated by the transplant service at [facility] but is not needed transplant obviously at this time. She has been thought to be a borderline stent-graft candidate in the past but concern has been contrast induced nephropathy which could accelerate her need for hemodialysis. She understands this. She is very concerned about the aneurysm enlarging.  Past Medical History  Diagnosis Date  . CAD (coronary artery disease)     50% proximal LAD stenosis, 50% marginal stenosis of the circumflex, RCA 95% stenosis, EF 40% stenosis with hypokinesis of the interior wall.  She had a Taxus stent placed in 04/16/2006  . Hypertension   . Abdominal aortic aneurysm   . Renal artery stenosis     S/P stenting of the right renal atrophic left renal  . Dyslipidemia   . Hypothyroidism   . Tobacco user     Previous  . Adenomatous polyp 2004, 2011  . Hyperplastic colon polyp 2004  . Hemorrhoids   . Diverticulosis   . Renal insufficiency   . Anemia   . Myocardial infarction     History  Substance Use Topics  . Smoking status: Former Smoker -- 1 years    Types: Cigarettes    Quit date: 03/17/2006  . Smokeless tobacco: Never Used  . Alcohol Use: No    Family History  Problem Relation Age of Onset  . Uterine cancer Mother   . Heart attack Father 58  . Heart disease Father     before age 28  . Peripheral vascular disease Sister   . Other Sister     Renal artery stenosis  . Heart disease Sister     before age 56  .  Heart attack Brother 42  . Heart disease Brother     before age 78  . Cancer Brother     lung  . Peripheral vascular disease Sister   . Colon cancer Neg Hx   . Cancer Maternal Aunt     breast  . Cancer Maternal Aunt     breast    Allergies  Allergen Reactions  . Sulfonamide Derivatives     Current outpatient prescriptions:aspirin 81 MG tablet, Take 81 mg by mouth daily.  , Disp: , Rfl: ;  citalopram (CELEXA) 20 MG tablet, 1 tab daily, Disp: , Rfl: ;  furosemide (LASIX) 40 MG tablet, Take 0.5 tablets (20 mg total) by mouth daily. 1 tab daily, Disp: , Rfl: ;  hydrocortisone (ANUSOL-HC) 25 MG suppository, Place 25 mg rectally at bedtime. , Disp: , Rfl:  levothyroxine (SYNTHROID, LEVOTHROID) 50 MCG tablet, Take 50 mcg by mouth daily.  , Disp: , Rfl: ;  NIFEdipine (PROCARDIA XL/ADALAT-CC) 60 MG 24 hr tablet, TAKE 1 TABLET DAILY, Disp: 30 tablet, Rfl: 11;  NITROSTAT 0.4 MG SL tablet, DISSOLVE 1 TABLET UNDER TONGUE EVERY 5 MINUTES FOR 3 DOSES FOR CHESTPAIN, Disp: 25 each, Rfl: 7;  Omega-3-acid Ethyl Esters (LOVAZA PO), Take 2 mg by mouth daily.  , Disp: , Rfl:  Pitavastatin Calcium (  LIVALO) 2 MG TABS, Take by mouth. 1 tab daily , Disp: , Rfl: ;  pravastatin (PRAVACHOL) 80 MG tablet, Take 1 tablet by mouth daily., Disp: , Rfl: ;  senna (SENOKOT) 8.6 MG tablet, 1 tab every other day , Disp: , Rfl:   BP 155/96  Pulse 72  Resp 18  Ht 5\' 5"  (1.651 m)  Wt 143 lb 12.8 oz (65.227 kg)  BMI 23.93 kg/m2  Body mass index is 23.93 kg/(m^2).           Review of Systems denies chest pain, dyspnea on exertion, PND, orthopnea, hemoptysis, lateralizing weakness, aphasia, amaurosis fugax, diplopia, blurred vision, syncope. All other systems negative and a complete review of     Objective:   Physical Exam BP 155/96  Pulse 72  Resp 18  Ht 5\' 5"  (1.651 m)  Wt 143 lb 12.8 oz (65.227 kg)  BMI 23.93 kg/m2  Gen.-alert and oriented x3 in no apparent distress HEENT normal for age Lungs no rhonchi  or wheezing Cardiovascular regular rhythm no murmurs carotid pulses 3+ palpable no bruits audible Abdomen soft nontender-5 cm pulsatile mass Musculoskeletal free of  major deformities Skin clear -no rashes Neurologic normal Lower extremities 3+ femoral and dorsalis pedis pulses palpable bilaterally with no edema  Today I ordered a CT scan without contrast which are reviewed and interpreted by computer. Her maximum diameter is 5.2 cm in the distal aorta. The neck of the aorta below the right renal artery stent angulates to the right and has a maximum diameter of approximately 30 mm.       Assessment:     5.2 cm infrarenal abdominal aortic aneurysm with atrophic left kidney and previous right renal artery stent History of coronary artery disease with myocardial infarction in 2008 and previous cardiac stent followed by Dr. Antoine Poche Chronic renal insufficiency with creatinine 2.5 followed by Dr. Casimiro Needle     Plan:     #1 Cardiolite #2 CO2 angiogram to be performed by Dr. Myra Gianotti next week with IVUS to get accurate diameter of neck of aneurysm #3 patient to return in 2 weeks and we'll discuss possible stent grafting with limited contrast versus open repair She would like to proceed with repair of his aneurysm if all are in agreement

## 2012-11-25 ENCOUNTER — Encounter (HOSPITAL_COMMUNITY): Payer: Self-pay | Admitting: Respiratory Therapy

## 2012-11-25 ENCOUNTER — Telehealth: Payer: Self-pay | Admitting: Cardiology

## 2012-11-25 NOTE — Telephone Encounter (Signed)
Called to clarify which medication was stopped in June.  Advised that it was Plavix.  States she wanted to be sure it wasn't Procardia.  Is taking the Procardia.

## 2012-11-25 NOTE — Telephone Encounter (Signed)
Md took me off one of my meds.  Can you tell me which one it is.

## 2012-11-30 ENCOUNTER — Encounter (HOSPITAL_COMMUNITY)
Admission: RE | Disposition: A | Discharge status: Home / Self Care | Payer: Self-pay | Source: Ambulatory Visit | Attending: Surgery

## 2012-11-30 ENCOUNTER — Ambulatory Visit (HOSPITAL_COMMUNITY)
Admission: RE | Admit: 2012-11-30 | Discharge: 2012-11-30 | Disposition: A | Discharge status: Home / Self Care | Payer: Medicare Other | Source: Ambulatory Visit | Attending: Surgery | Admitting: Surgery

## 2012-11-30 DIAGNOSIS — I714 Abdominal aortic aneurysm, without rupture, unspecified: Secondary | ICD-10-CM | POA: Insufficient documentation

## 2012-11-30 DIAGNOSIS — I252 Old myocardial infarction: Secondary | ICD-10-CM | POA: Insufficient documentation

## 2012-11-30 DIAGNOSIS — I701 Atherosclerosis of renal artery: Secondary | ICD-10-CM | POA: Insufficient documentation

## 2012-11-30 DIAGNOSIS — N189 Chronic kidney disease, unspecified: Secondary | ICD-10-CM | POA: Insufficient documentation

## 2012-11-30 DIAGNOSIS — I129 Hypertensive chronic kidney disease with stage 1 through stage 4 chronic kidney disease, or unspecified chronic kidney disease: Secondary | ICD-10-CM | POA: Insufficient documentation

## 2012-11-30 HISTORY — PX: ABDOMINAL ANGIOGRAM: SHX5499

## 2012-11-30 LAB — POCT I-STAT, CHEM 8
Calcium, Ion: 1.13 mmol/L (ref 1.13–1.30)
Creatinine, Ser: 2.9 mg/dL — ABNORMAL HIGH (ref 0.50–1.10)
Glucose, Bld: 99 mg/dL (ref 70–99)
Hemoglobin: 12.6 g/dL (ref 12.0–15.0)
TCO2: 21 mmol/L (ref 0–100)

## 2012-11-30 SURGERY — ABDOMINAL ANGIOGRAM
Anesthesia: LOCAL

## 2012-11-30 MED ORDER — OXYCODONE-ACETAMINOPHEN 5-325 MG PO TABS: 1.0000 | ORAL_TABLET | ORAL | Status: DC | PRN

## 2012-11-30 MED ORDER — SODIUM CHLORIDE 0.9 % IV SOLN: 1.0000 mL/kg/h | INTRAVENOUS | Status: DC

## 2012-11-30 MED ORDER — LIDOCAINE HCL (PF) 1 % IJ SOLN
INTRAMUSCULAR | Status: AC
  Filled 2012-11-30: qty 30

## 2012-11-30 MED ORDER — SODIUM CHLORIDE 0.9 % IV SOLN
INTRAVENOUS | Status: DC
Start: 2012-11-30 — End: 2012-11-30
  Administered 2012-11-30: 1000 mL via INTRAVENOUS

## 2012-11-30 MED ORDER — ACETAMINOPHEN 325 MG RE SUPP
325.0000 mg | RECTAL | Status: DC | PRN
Start: 2012-11-30 — End: 2012-11-30

## 2012-11-30 MED ORDER — GUAIFENESIN-DM 100-10 MG/5ML PO SYRP: 15.0000 mL | ORAL_SOLUTION | ORAL | Status: DC | PRN

## 2012-11-30 MED ORDER — HYDRALAZINE HCL 20 MG/ML IJ SOLN: 10.0000 mg | INTRAMUSCULAR | Status: DC | PRN

## 2012-11-30 MED ORDER — ONDANSETRON HCL 4 MG/2ML IJ SOLN: 4.0000 mg | Freq: Four times a day (QID) | INTRAMUSCULAR | Status: DC | PRN

## 2012-11-30 MED ORDER — ACETAMINOPHEN 325 MG PO TABS
325.0000 mg | ORAL_TABLET | ORAL | Status: DC | PRN
Start: 2012-11-30 — End: 2012-11-30

## 2012-11-30 MED ORDER — HEPARIN (PORCINE) IN NACL 2-0.9 UNIT/ML-% IJ SOLN
INTRAMUSCULAR | Status: AC
  Filled 2012-11-30: qty 1000

## 2012-11-30 MED ORDER — ALUM & MAG HYDROXIDE-SIMETH 200-200-20 MG/5ML PO SUSP
15.0000 mL | ORAL | Status: DC | PRN
Start: 2012-11-30 — End: 2012-11-30

## 2012-11-30 MED ORDER — FENTANYL CITRATE 0.05 MG/ML IJ SOLN
INTRAMUSCULAR | Status: AC
  Filled 2012-11-30: qty 2

## 2012-11-30 MED ORDER — PHENOL 1.4 % MT LIQD: 1.0000 | OROMUCOSAL | Status: DC | PRN

## 2012-11-30 MED ORDER — METOPROLOL TARTRATE 1 MG/ML IV SOLN: 2.0000 mg | INTRAVENOUS | Status: DC | PRN

## 2012-11-30 MED ORDER — LABETALOL HCL 5 MG/ML IV SOLN: 10.0000 mg | INTRAVENOUS | Status: DC | PRN

## 2012-11-30 NOTE — Op Note (Signed)
[facility]  Patient name: [name] MRN: [id number] DOB: 08-11-1934 Sex: female  11/30/2012 Pre-operative Diagnosis: Abdominal aortic aneurysm Post-operative diagnosis:  Same Surgeon:  Jorge Ny Procedure Performed:  1.  ultrasound-guided access, right femoral artery  2.  abdominal aortogram with bifemoral runoff using CO2    Indications:  The patient has a 5.3 cm infrarenal abdominal aortic aneurysm. This has been imaged with noncontrasted CT scan because of the patient's elevated creatinine. There is concern over her infrarenal neck. She therefore comes in today for further evaluation.  Procedure:  The patient was identified in the holding area and taken to room 8.  The patient was then placed supine on the table and prepped and draped in the usual sterile fashion.  A time out was called.  Ultrasound was used to evaluate the right common femoral artery.  It was patent .  A digital ultrasound image was acquired.  A micropuncture needle was used to access the right common femoral artery under ultrasound guidance.  An 018 wire was advanced without resistance and a micropuncture sheath was placed.  The 018 wire was removed and a benson wire was placed.  The micropuncture sheath was exchanged for a 5 french sheath.  An omniflush catheter was advanced over the wire to the level of L-1.  An abdominal angiogram was obtained with CO2. Next the catheter was pulled down to the aortic bifurcation and pelvic angiography was performed  Findings:   Aortogram:  A stent is visualized within the right renal artery which is patent. It was very difficult to evaluate the infrarenal neck because of the overlying superior mesenteric artery. It did appear that the patient has an adequate infrarenal neck length for stent graft repair. Aneurysmal changes are noted to the infrarenal abdominal aorta. Bilateral common and external iliac arteries are widely patent. The right  hypogastric artery is widely patent. The new visualization the left hypogastric artery was pain secondary to CO2   Intervention:  None  Impression:  #1  infrarenal abdominal aortic aneurysm which does appear to have an adequate infrarenal neck length for endovascular repair.  #2   Patent bilateral iliac systems    V. Durene Cal, M.D. [facility] [place] Office: [phone] Pager:  [phone]

## 2012-11-30 NOTE — Interval H&P Note (Signed)
History and Physical Interval Note:  11/30/2012 10:29 AM  [name]  has presented today for surgery, with the diagnosis of AAA  The various methods of treatment have been discussed with the patient and family. After consideration of risks, benefits and other options for treatment, the patient has consented to  Procedure(s): ABDOMINAL ANGIOGRAM (N/A) as a surgical intervention .  The patient's history has been reviewed, patient examined, no change in status, stable for surgery.  I have reviewed the patient's chart and labs.  Questions were answered to the patient's satisfaction.     BRABHAM IV, V. WELLS

## 2012-11-30 NOTE — Progress Notes (Signed)
Creatinine phoned to Janne Napoleon, RN  No further orders

## 2012-11-30 NOTE — H&P (View-Only) (Signed)
Subjective:     Patient ID: [name], female   DOB: 06/15/1934, 77 y.o.   MRN: [id number]  HPI this 77-year-old female returns for further discussion regarding her abdominal aortic aneurysm. She has known renal insufficiency with creatinine of 2.5 followed by Dr. Powell. Her aneurysm has gradually enlarged and today on CT scan the aneurysm measures 5.2 cm in maximum diameter. Patient has no function in her left kidney and has had a previous stent in her right renal artery. She has been evaluated by the transplant service at [facility] but is not needed transplant obviously at this time. She has been thought to be a borderline stent-graft candidate in the past but concern has been contrast induced nephropathy which could accelerate her need for hemodialysis. She understands this. She is very concerned about the aneurysm enlarging.  Past Medical History  Diagnosis Date  . CAD (coronary artery disease)     50% proximal LAD stenosis, 50% marginal stenosis of the circumflex, RCA 95% stenosis, EF 40% stenosis with hypokinesis of the interior wall.  She had a Taxus stent placed in 04/16/2006  . Hypertension   . Abdominal aortic aneurysm   . Renal artery stenosis     S/P stenting of the right renal atrophic left renal  . Dyslipidemia   . Hypothyroidism   . Tobacco user     Previous  . Adenomatous polyp 2004, 2011  . Hyperplastic colon polyp 2004  . Hemorrhoids   . Diverticulosis   . Renal insufficiency   . Anemia   . Myocardial infarction     History  Substance Use Topics  . Smoking status: Former Smoker -- 1 years    Types: Cigarettes    Quit date: 03/17/2006  . Smokeless tobacco: Never Used  . Alcohol Use: No    Family History  Problem Relation Age of Onset  . Uterine cancer Mother   . Heart attack Father 58  . Heart disease Father     before age 60  . Peripheral vascular disease Sister   . Other Sister     Renal artery stenosis  . Heart disease Sister     before age 60  .  Heart attack Brother 42  . Heart disease Brother     before age 60  . Cancer Brother     lung  . Peripheral vascular disease Sister   . Colon cancer Neg Hx   . Cancer Maternal Aunt     breast  . Cancer Maternal Aunt     breast    Allergies  Allergen Reactions  . Sulfonamide Derivatives     Current outpatient prescriptions:aspirin 81 MG tablet, Take 81 mg by mouth daily.  , Disp: , Rfl: ;  citalopram (CELEXA) 20 MG tablet, 1 tab daily, Disp: , Rfl: ;  furosemide (LASIX) 40 MG tablet, Take 0.5 tablets (20 mg total) by mouth daily. 1 tab daily, Disp: , Rfl: ;  hydrocortisone (ANUSOL-HC) 25 MG suppository, Place 25 mg rectally at bedtime. , Disp: , Rfl:  levothyroxine (SYNTHROID, LEVOTHROID) 50 MCG tablet, Take 50 mcg by mouth daily.  , Disp: , Rfl: ;  NIFEdipine (PROCARDIA XL/ADALAT-CC) 60 MG 24 hr tablet, TAKE 1 TABLET DAILY, Disp: 30 tablet, Rfl: 11;  NITROSTAT 0.4 MG SL tablet, DISSOLVE 1 TABLET UNDER TONGUE EVERY 5 MINUTES FOR 3 DOSES FOR CHESTPAIN, Disp: 25 each, Rfl: 7;  Omega-3-acid Ethyl Esters (LOVAZA PO), Take 2 mg by mouth daily.  , Disp: , Rfl:  Pitavastatin Calcium (  LIVALO) 2 MG TABS, Take by mouth. 1 tab daily , Disp: , Rfl: ;  pravastatin (PRAVACHOL) 80 MG tablet, Take 1 tablet by mouth daily., Disp: , Rfl: ;  senna (SENOKOT) 8.6 MG tablet, 1 tab every other day , Disp: , Rfl:   BP 155/96  Pulse 72  Resp 18  Ht 5' 5" (1.651 m)  Wt 143 lb 12.8 oz (65.227 kg)  BMI 23.93 kg/m2  Body mass index is 23.93 kg/(m^2).           Review of Systems denies chest pain, dyspnea on exertion, PND, orthopnea, hemoptysis, lateralizing weakness, aphasia, amaurosis fugax, diplopia, blurred vision, syncope. All other systems negative and a complete review of     Objective:   Physical Exam BP 155/96  Pulse 72  Resp 18  Ht 5' 5" (1.651 m)  Wt 143 lb 12.8 oz (65.227 kg)  BMI 23.93 kg/m2  Gen.-alert and oriented x3 in no apparent distress HEENT normal for age Lungs no rhonchi  or wheezing Cardiovascular regular rhythm no murmurs carotid pulses 3+ palpable no bruits audible Abdomen soft nontender-5 cm pulsatile mass Musculoskeletal free of  major deformities Skin clear -no rashes Neurologic normal Lower extremities 3+ femoral and dorsalis pedis pulses palpable bilaterally with no edema  Today I ordered a CT scan without contrast which are reviewed and interpreted by computer. Her maximum diameter is 5.2 cm in the distal aorta. The neck of the aorta below the right renal artery stent angulates to the right and has a maximum diameter of approximately 30 mm.       Assessment:     5.2 cm infrarenal abdominal aortic aneurysm with atrophic left kidney and previous right renal artery stent History of coronary artery disease with myocardial infarction in 2008 and previous cardiac stent followed by Dr. Hochrein Chronic renal insufficiency with creatinine 2.5 followed by Dr. Alvin Powell     Plan:     #1 Cardiolite #2 CO2 angiogram to be performed by Dr. Brabham next week with IVUS to get accurate diameter of neck of aneurysm #3 patient to return in 2 weeks and we'll discuss possible stent grafting with limited contrast versus open repair She would like to proceed with repair of his aneurysm if all are in agreement      

## 2012-12-01 ENCOUNTER — Encounter: Payer: Self-pay | Admitting: Vascular Surgery

## 2012-12-02 ENCOUNTER — Ambulatory Visit (HOSPITAL_COMMUNITY): Payer: Medicare Other | Attending: Vascular Surgery | Admitting: Radiology

## 2012-12-02 VITALS — BP 137/95 | Ht 65.0 in | Wt 141.0 lb

## 2012-12-02 DIAGNOSIS — I779 Disorder of arteries and arterioles, unspecified: Secondary | ICD-10-CM | POA: Insufficient documentation

## 2012-12-02 DIAGNOSIS — I714 Abdominal aortic aneurysm, without rupture: Secondary | ICD-10-CM

## 2012-12-02 DIAGNOSIS — Z01810 Encounter for preprocedural cardiovascular examination: Secondary | ICD-10-CM | POA: Insufficient documentation

## 2012-12-02 DIAGNOSIS — E785 Hyperlipidemia, unspecified: Secondary | ICD-10-CM | POA: Insufficient documentation

## 2012-12-02 DIAGNOSIS — R5381 Other malaise: Secondary | ICD-10-CM | POA: Insufficient documentation

## 2012-12-02 DIAGNOSIS — I2510 Atherosclerotic heart disease of native coronary artery without angina pectoris: Secondary | ICD-10-CM

## 2012-12-02 DIAGNOSIS — I252 Old myocardial infarction: Secondary | ICD-10-CM | POA: Insufficient documentation

## 2012-12-02 DIAGNOSIS — Z9861 Coronary angioplasty status: Secondary | ICD-10-CM | POA: Insufficient documentation

## 2012-12-02 DIAGNOSIS — I10 Essential (primary) hypertension: Secondary | ICD-10-CM | POA: Insufficient documentation

## 2012-12-02 DIAGNOSIS — I739 Peripheral vascular disease, unspecified: Secondary | ICD-10-CM | POA: Insufficient documentation

## 2012-12-02 DIAGNOSIS — Z8249 Family history of ischemic heart disease and other diseases of the circulatory system: Secondary | ICD-10-CM | POA: Insufficient documentation

## 2012-12-02 DIAGNOSIS — Z87891 Personal history of nicotine dependence: Secondary | ICD-10-CM | POA: Insufficient documentation

## 2012-12-02 MED ORDER — TECHNETIUM TC 99M SESTAMIBI GENERIC - CARDIOLITE
33.0000 | Freq: Once | INTRAVENOUS | Status: AC | PRN
  Administered 2012-12-02: 33 via INTRAVENOUS

## 2012-12-02 MED ORDER — TECHNETIUM TC 99M SESTAMIBI GENERIC - CARDIOLITE
11.0000 | Freq: Once | INTRAVENOUS | Status: AC | PRN
  Administered 2012-12-02: 11 via INTRAVENOUS

## 2012-12-02 MED ORDER — REGADENOSON 0.4 MG/5ML IV SOLN
0.4000 mg | Freq: Once | INTRAVENOUS | Status: AC
  Administered 2012-12-02: 0.4 mg via INTRAVENOUS

## 2012-12-02 NOTE — Progress Notes (Signed)
  [facility] SITE 3 NUCLEAR MED [address] [place], [place] [address] [phone]    Cardiology Nuclear Med Study  [name] is a 78 y.o. female     MRN : [id number]     DOB: [birth year]  Procedure Date: 12/02/2012  Nuclear Med Background Indication for Stress Test:  Evaluation for Ischemia, Surgical Clearance: Pending AAA Repair with Dr. Hart Rochester, Stent Patency and PTCA Patency History:  '08 MI>Heart Catheterization:EF=40%,moderate CAD>stent of RCA;Echo:EF=60-70%;'12 MPS; EF=65%,no ischemia Cardiac Risk Factors: Carotid Disease, Family History - CAD, History of Smoking, Hypertension, Lipids and PVD  Symptoms:  Fatigue   Nuclear Pre-Procedure Caffeine/Decaff Intake:  None NPO After: 6:00am   Lungs:  clear O2 Sat: 99% on room air. IV 0.9% NS with Angio Cath:  22g  IV Site: R Hand  IV Started by:  Cathlyn Parsons, RN  Chest Size (in):  36 Cup Size: B  Height: 5\' 5"  (1.651 m)  Weight:  141 lb (63.957 kg)  BMI:  Body mass index is 23.46 kg/(m^2). Tech Comments:  n/a    Nuclear Med Study 1 or 2 day study: 1 day  Stress Test Type:  Lexiscan  Reading MD: Wyvonna Plum  Order Authorizing Provider:  Arnoldo Lenis  Resting Radionuclide: Technetium 83m Sestamibi  Resting Radionuclide Dose: 11.0 mCi   Stress Radionuclide:  Technetium 81m Sestamibi  Stress Radionuclide Dose: 33.0 mCi           Stress Protocol Rest HR: 60 Stress HR: 78  Rest BP: 137/95 Stress BP: 131/97  Exercise Time (min): n/a METS: n/a   Predicted Max HR: 145 bpm % Max HR: 56.55 bpm Rate Pressure Product: 78469   Dose of Adenosine (mg):  n/a Dose of Lexiscan: 0.4 mg  Dose of Atropine (mg): n/a Dose of Dobutamine: n/a mcg/kg/min (at max HR)  Stress Test Technologist: Cathlyn Parsons, RN  Nuclear Technologist:  Domenic Polite, CNMT     Rest Procedure:  Myocardial perfusion imaging was performed at rest 45 minutes following the intravenous administration of Technetium 17m  Sestamibi. Rest ECG: NSR - Normal EKG  Stress Procedure:  The patient received IV Lexiscan 0.4 mg over 15-seconds.  Technetium 38m Sestamibi injected at 30-seconds.  Quantitative spect images were obtained after a 45 minute delay. Stress ECG: No significant change from baseline ECG  QPS Raw Data Images:  Normal; no motion artifact; normal heart/lung ratio. Stress Images:  Normal homogeneous uptake in all areas of the myocardium. Rest Images:  Normal homogeneous uptake in all areas of the myocardium. Subtraction (SDS):  Normal Transient Ischemic Dilatation (Normal <1.22):  n/a Lung/Heart Ratio (Normal <0.45):  0.34  Quantitative Gated Spect Images QGS EDV:  73 ml QGS ESV:  26 ml  Impression Exercise Capacity:  Lexiscan with no exercise. BP Response:  Normal blood pressure response. Clinical Symptoms:  No significant symptoms noted. ECG Impression:  No significant ST segment change suggestive of ischemia. Comparison with Prior Nuclear Study: No significant change from previous study  Overall Impression:  Normal stress nuclear study.  LV Ejection Fraction: 64%.  LV Wall Motion:  NL LV Function; NL Wall Motion  Tobias Alexander, Rexene Edison 12/02/2012

## 2012-12-06 ENCOUNTER — Encounter: Payer: Self-pay | Admitting: Vascular Surgery

## 2012-12-07 ENCOUNTER — Encounter: Payer: Self-pay | Admitting: Vascular Surgery

## 2012-12-07 ENCOUNTER — Ambulatory Visit (INDEPENDENT_AMBULATORY_CARE_PROVIDER_SITE_OTHER): Payer: Medicare Other | Admitting: Vascular Surgery

## 2012-12-07 VITALS — BP 134/78 | HR 74 | Resp 16 | Ht 65.0 in | Wt 143.0 lb

## 2012-12-07 DIAGNOSIS — I714 Abdominal aortic aneurysm, without rupture: Secondary | ICD-10-CM

## 2012-12-07 NOTE — Progress Notes (Signed)
Subjective:     Patient ID: [name], female   DOB: 1934/07/07, 78 y.o.   MRN: [id number]  HPI this 78 year old female returns today to discuss treatment of her 5.2 cm abdominal aortic aneurysm. She had CO2 angiography performed by Dr. Myra Gianotti to better delineate the anatomy of her aneurysm. She also had a nuclear medicine Cardiolite exam which revealed an ejection fraction of 65% no evidence of ischemia. She denies any recent chest pain or dyspnea on exertion. She would like to proceed with stent grafting of her aneurysm as soon as possible  Past Medical History  Diagnosis Date  . CAD (coronary artery disease)     50% proximal LAD stenosis, 50% marginal stenosis of the circumflex, RCA 95% stenosis, EF 40% stenosis with hypokinesis of the interior wall.  She had a Taxus stent placed in 04/16/2006  . Hypertension   . Abdominal aortic aneurysm   . Renal artery stenosis     S/P stenting of the right renal atrophic left renal  . Dyslipidemia   . Hypothyroidism   . Tobacco user     Previous  . Adenomatous polyp 2004, 2011  . Hyperplastic colon polyp 2004  . Hemorrhoids   . Diverticulosis   . Renal insufficiency   . Anemia   . Myocardial infarction     History  Substance Use Topics  . Smoking status: Former Smoker -- 1 years    Types: Cigarettes    Quit date: 03/17/2006  . Smokeless tobacco: Never Used  . Alcohol Use: No    Family History  Problem Relation Age of Onset  . Uterine cancer Mother   . Heart attack Father 47  . Heart disease Father     before age 41  . Peripheral vascular disease Sister   . Other Sister     Renal artery stenosis  . Heart disease Sister     before age 31  . Heart attack Brother 42  . Heart disease Brother     before age 36  . Cancer Brother     lung  . Peripheral vascular disease Sister   . Colon cancer Neg Hx   . Cancer Maternal Aunt     breast  . Cancer Maternal Aunt     breast    Allergies  Allergen Reactions  . Sulfonamide  Derivatives Hives    Current outpatient prescriptions:aspirin 81 MG tablet, Take 81 mg by mouth daily.  , Disp: , Rfl: ;  citalopram (CELEXA) 20 MG tablet, Take 20 mg by mouth daily. , Disp: , Rfl: ;  furosemide (LASIX) 40 MG tablet, Take 0.5 tablets (20 mg total) by mouth daily. 1 tab daily, Disp: , Rfl: ;  hydrocortisone (ANUSOL-HC) 25 MG suppository, Place 25 mg rectally at bedtime. , Disp: , Rfl:  levothyroxine (SYNTHROID, LEVOTHROID) 50 MCG tablet, Take 50 mcg by mouth daily.  , Disp: , Rfl: ;  NIFEdipine (PROCARDIA XL/ADALAT-CC) 60 MG 24 hr tablet, Take 60 mg by mouth daily., Disp: , Rfl: ;  nitroGLYCERIN (NITROSTAT) 0.4 MG SL tablet, Place 0.4 mg under the tongue every 5 (five) minutes as needed for chest pain., Disp: , Rfl: ;  Omega-3-acid Ethyl Esters (LOVAZA PO), Take 1 g by mouth daily. , Disp: , Rfl:  Pitavastatin Calcium (LIVALO) 2 MG TABS, Take 2 mg by mouth daily. , Disp: , Rfl: ;  pravastatin (PRAVACHOL) 80 MG tablet, Take 80 mg by mouth daily. , Disp: , Rfl:   BP 134/78  Pulse 74  Resp 16  Ht 5\' 5"  (1.651 m)  Wt 143 lb (64.864 kg)  BMI 23.8 kg/m2  SpO2 98%  Body mass index is 23.8 kg/(m^2).          Review of Systems     Objective:   Physical Exam BP 134/78  Pulse 74  Resp 16  Ht 5\' 5"  (1.651 m)  Wt 143 lb (64.864 kg)  BMI 23.8 kg/m2  SpO2 98%  A well-developed well-nourished female no apparent stress alert and oriented x3 Lungs no rhonchi or wheezing Abdomen soft nontender with 5 cm pulsatile mass 3+ femoral popliteal and dorsalis pedis pulses palpable bilaterally.       Assessment:     5.2 cm abdominal aortic aneurysm with atrophic left kidney and previous stent in right renal artery Chronic renal insufficiency with creatinine 2.5-3.0 range  Discussed at length with patient possibility of worsening of renal function and possibility of hemodialysis being initiated. Will limit contrast as much as possible and use primarily CO2 and IVUS  Risks  thoroughly discussed with her and she would like to proceed     Plan:     Plan aortic stent grafting of infrarenal abdominal aortic aneurysm on Wednesday, October 8  Will admit patient day prior for hydration for protection of renal function

## 2012-12-17 ENCOUNTER — Encounter (HOSPITAL_COMMUNITY): Payer: Self-pay | Admitting: Pharmacy Technician

## 2012-12-21 ENCOUNTER — Encounter (HOSPITAL_COMMUNITY): Payer: Self-pay

## 2012-12-21 ENCOUNTER — Inpatient Hospital Stay (HOSPITAL_COMMUNITY): Payer: Medicare Other

## 2012-12-21 ENCOUNTER — Inpatient Hospital Stay (HOSPITAL_COMMUNITY)
Admission: AD | Admit: 2012-12-21 | Discharge: 2012-12-23 | DRG: 238 | Disposition: A | Discharge status: Home / Self Care | Payer: Medicare Other | Source: Ambulatory Visit | Attending: Vascular Surgery | Admitting: Vascular Surgery

## 2012-12-21 DIAGNOSIS — Z87891 Personal history of nicotine dependence: Secondary | ICD-10-CM

## 2012-12-21 DIAGNOSIS — Z79899 Other long term (current) drug therapy: Secondary | ICD-10-CM

## 2012-12-21 DIAGNOSIS — I714 Abdominal aortic aneurysm, without rupture, unspecified: Principal | ICD-10-CM | POA: Diagnosis present

## 2012-12-21 DIAGNOSIS — N2889 Other specified disorders of kidney and ureter: Secondary | ICD-10-CM | POA: Diagnosis present

## 2012-12-21 DIAGNOSIS — Z7982 Long term (current) use of aspirin: Secondary | ICD-10-CM

## 2012-12-21 DIAGNOSIS — I739 Peripheral vascular disease, unspecified: Secondary | ICD-10-CM | POA: Diagnosis present

## 2012-12-21 DIAGNOSIS — I129 Hypertensive chronic kidney disease with stage 1 through stage 4 chronic kidney disease, or unspecified chronic kidney disease: Secondary | ICD-10-CM | POA: Diagnosis present

## 2012-12-21 DIAGNOSIS — I2510 Atherosclerotic heart disease of native coronary artery without angina pectoris: Secondary | ICD-10-CM | POA: Diagnosis present

## 2012-12-21 DIAGNOSIS — N189 Chronic kidney disease, unspecified: Secondary | ICD-10-CM | POA: Diagnosis present

## 2012-12-21 DIAGNOSIS — Z9861 Coronary angioplasty status: Secondary | ICD-10-CM

## 2012-12-21 DIAGNOSIS — Z23 Encounter for immunization: Secondary | ICD-10-CM

## 2012-12-21 DIAGNOSIS — I252 Old myocardial infarction: Secondary | ICD-10-CM

## 2012-12-21 DIAGNOSIS — N269 Renal sclerosis, unspecified: Secondary | ICD-10-CM | POA: Diagnosis present

## 2012-12-21 DIAGNOSIS — E039 Hypothyroidism, unspecified: Secondary | ICD-10-CM | POA: Diagnosis present

## 2012-12-21 DIAGNOSIS — E785 Hyperlipidemia, unspecified: Secondary | ICD-10-CM | POA: Diagnosis present

## 2012-12-21 HISTORY — PX: ABDOMINAL ANGIOGRAM: SHX5705

## 2012-12-21 LAB — URINALYSIS, ROUTINE W REFLEX MICROSCOPIC
Bilirubin Urine: NEGATIVE
Glucose, UA: NEGATIVE mg/dL
Hgb urine dipstick: NEGATIVE
Ketones, ur: NEGATIVE mg/dL
Nitrite: NEGATIVE
Protein, ur: 30 mg/dL — AB
Specific Gravity, Urine: 1.011 (ref 1.005–1.030)
Urobilinogen, UA: 0.2 mg/dL (ref 0.0–1.0)
pH: 6 (ref 5.0–8.0)

## 2012-12-21 LAB — COMPREHENSIVE METABOLIC PANEL
ALT: 9 U/L (ref 0–35)
Albumin: 3.7 g/dL (ref 3.5–5.2)
Alkaline Phosphatase: 141 U/L — ABNORMAL HIGH (ref 39–117)
BUN: 25 mg/dL — ABNORMAL HIGH (ref 6–23)
CO2: 22 mEq/L (ref 19–32)
Chloride: 107 mEq/L (ref 96–112)
Creatinine, Ser: 2.65 mg/dL — ABNORMAL HIGH (ref 0.50–1.10)
GFR calc Af Amer: 19 mL/min — ABNORMAL LOW (ref >90)
Potassium: 3.7 mEq/L (ref 3.5–5.1)
Sodium: 142 mEq/L (ref 135–145)
Total Bilirubin: 0.3 mg/dL (ref 0.3–1.2)
Total Protein: 7.1 g/dL (ref 6.0–8.3)

## 2012-12-21 LAB — CBC
HCT: 34.2 % — ABNORMAL LOW (ref 36.0–46.0)
Hemoglobin: 11.3 g/dL — ABNORMAL LOW (ref 12.0–15.0)
MCHC: 33 g/dL (ref 30.0–36.0)
RBC: 3.74 MIL/uL — ABNORMAL LOW (ref 3.87–5.11)
RDW: 13.3 % (ref 11.5–15.5)
WBC: 6.1 10*3/uL (ref 4.0–10.5)

## 2012-12-21 LAB — SURGICAL PCR SCREEN: Staphylococcus aureus: NEGATIVE

## 2012-12-21 LAB — PROTIME-INR
INR: 1.05 (ref 0.00–1.49)
Prothrombin Time: 13.5 seconds (ref 11.6–15.2)

## 2012-12-21 LAB — TYPE AND SCREEN
ABO/RH(D): O NEG
Antibody Screen: NEGATIVE

## 2012-12-21 LAB — URINE MICROSCOPIC-ADD ON

## 2012-12-21 MED ORDER — PHENOL 1.4 % MT LIQD
1.0000 | OROMUCOSAL | Status: DC | PRN
  Filled 2012-12-21: qty 177

## 2012-12-21 MED ORDER — SODIUM CHLORIDE 0.9 % IV SOLN
INTRAVENOUS | Status: DC
  Administered 2012-12-21 (×2): via INTRAVENOUS

## 2012-12-21 MED ORDER — LEVOTHYROXINE SODIUM 50 MCG PO TABS
50.0000 ug | ORAL_TABLET | Freq: Every day | ORAL | Status: DC
  Filled 2012-12-21 (×3): qty 1

## 2012-12-21 MED ORDER — NIFEDIPINE ER 60 MG PO TB24
60.0000 mg | ORAL_TABLET | Freq: Every day | ORAL | Status: DC
  Filled 2012-12-21: qty 1

## 2012-12-21 MED ORDER — METOPROLOL TARTRATE 1 MG/ML IV SOLN: 2.0000 mg | INTRAVENOUS | Status: DC | PRN

## 2012-12-21 MED ORDER — ACETAMINOPHEN 650 MG RE SUPP: 325.0000 mg | RECTAL | Status: DC | PRN

## 2012-12-21 MED ORDER — LABETALOL HCL 5 MG/ML IV SOLN
10.0000 mg | INTRAVENOUS | Status: DC | PRN
  Filled 2012-12-21: qty 4

## 2012-12-21 MED ORDER — DEXTROSE 5 % IV SOLN
1.5000 g | INTRAVENOUS | Status: AC
Start: 2012-12-22 — End: 2012-12-22
  Administered 2012-12-22: 1.5 g via INTRAVENOUS
  Filled 2012-12-21: qty 1.5

## 2012-12-21 MED ORDER — CITALOPRAM HYDROBROMIDE 20 MG PO TABS: 20.0000 mg | ORAL_TABLET | Freq: Every day | ORAL | Status: DC

## 2012-12-21 MED ORDER — ONDANSETRON HCL 4 MG/2ML IJ SOLN: 4.0000 mg | Freq: Four times a day (QID) | INTRAMUSCULAR | Status: DC | PRN

## 2012-12-21 MED ORDER — OXYCODONE HCL 5 MG PO TABS: 5.0000 mg | ORAL_TABLET | ORAL | Status: DC | PRN

## 2012-12-21 MED ORDER — ALUM & MAG HYDROXIDE-SIMETH 200-200-20 MG/5ML PO SUSP: 15.0000 mL | ORAL | Status: DC | PRN

## 2012-12-21 MED ORDER — INFLUENZA VAC SPLIT QUAD 0.5 ML IM SUSP
0.5000 mL | INTRAMUSCULAR | Status: AC
  Filled 2012-12-21: qty 0.5

## 2012-12-21 MED ORDER — PNEUMOCOCCAL VAC POLYVALENT 25 MCG/0.5ML IJ INJ
0.5000 mL | INJECTION | INTRAMUSCULAR | Status: AC
  Filled 2012-12-21: qty 0.5

## 2012-12-21 MED ORDER — HYDRALAZINE HCL 20 MG/ML IJ SOLN: 10.0000 mg | INTRAMUSCULAR | Status: DC | PRN

## 2012-12-21 MED ORDER — ACETAMINOPHEN 325 MG PO TABS: 325.0000 mg | ORAL_TABLET | ORAL | Status: DC | PRN

## 2012-12-21 MED ORDER — NITROGLYCERIN 0.4 MG SL SUBL: 0.4000 mg | SUBLINGUAL_TABLET | SUBLINGUAL | Status: DC | PRN

## 2012-12-21 MED ORDER — GUAIFENESIN-DM 100-10 MG/5ML PO SYRP: 15.0000 mL | ORAL_SOLUTION | ORAL | Status: DC | PRN

## 2012-12-21 MED ORDER — SIMVASTATIN 40 MG PO TABS
40.0000 mg | ORAL_TABLET | Freq: Every day | ORAL | Status: DC
  Filled 2012-12-21: qty 1

## 2012-12-21 MED ORDER — POTASSIUM CHLORIDE CRYS ER 20 MEQ PO TBCR
20.0000 meq | EXTENDED_RELEASE_TABLET | Freq: Once | ORAL | Status: AC
  Administered 2012-12-21: 20 meq via ORAL
  Filled 2012-12-21: qty 1

## 2012-12-22 ENCOUNTER — Encounter (HOSPITAL_COMMUNITY)
Admission: AD | Disposition: A | Discharge status: Home / Self Care | Payer: Self-pay | Source: Ambulatory Visit | Attending: Vascular Surgery

## 2012-12-22 ENCOUNTER — Encounter (HOSPITAL_COMMUNITY): Payer: Self-pay | Admitting: Certified Registered Nurse Anesthetist

## 2012-12-22 ENCOUNTER — Encounter (HOSPITAL_COMMUNITY): Payer: Medicare Other | Admitting: Certified Registered Nurse Anesthetist

## 2012-12-22 ENCOUNTER — Inpatient Hospital Stay (HOSPITAL_COMMUNITY): Payer: Medicare Other

## 2012-12-22 ENCOUNTER — Inpatient Hospital Stay (HOSPITAL_COMMUNITY): Payer: Medicare Other | Admitting: Certified Registered Nurse Anesthetist

## 2012-12-22 ENCOUNTER — Inpatient Hospital Stay (HOSPITAL_COMMUNITY): Admission: RE | Admit: 2012-12-22 | Payer: Medicare Other | Source: Ambulatory Visit | Admitting: Vascular Surgery

## 2012-12-22 DIAGNOSIS — I714 Abdominal aortic aneurysm, without rupture, unspecified: Secondary | ICD-10-CM

## 2012-12-22 DIAGNOSIS — I701 Atherosclerosis of renal artery: Secondary | ICD-10-CM

## 2012-12-22 HISTORY — PX: ABDOMINAL AORTIC ENDOVASCULAR STENT GRAFT: SHX5707

## 2012-12-22 LAB — CBC
HCT: 31.8 % — ABNORMAL LOW (ref 36.0–46.0)
HCT: 30.3 % — ABNORMAL LOW (ref 36.0–46.0)
Hemoglobin: 9.9 g/dL — ABNORMAL LOW (ref 12.0–15.0)
Hemoglobin: 10 g/dL — ABNORMAL LOW (ref 12.0–15.0)
MCH: 30.5 pg (ref 26.0–34.0)
MCH: 30.6 pg (ref 26.0–34.0)
MCHC: 33.3 g/dL (ref 30.0–36.0)
MCV: 90.7 fL (ref 78.0–100.0)
MCV: 92.4 fL (ref 78.0–100.0)
Platelets: 221 10*3/uL (ref 150–400)
Platelets: 179 10*3/uL (ref 150–400)
Platelets: 161 10*3/uL (ref 150–400)
RBC: 3.24 MIL/uL — ABNORMAL LOW (ref 3.87–5.11)
RDW: 13.3 % (ref 11.5–15.5)
WBC: 6.5 10*3/uL (ref 4.0–10.5)
WBC: 10.2 10*3/uL (ref 4.0–10.5)

## 2012-12-22 LAB — BASIC METABOLIC PANEL
CO2: 21 mEq/L (ref 19–32)
Calcium: 8.1 mg/dL — ABNORMAL LOW (ref 8.4–10.5)
Chloride: 116 mEq/L — ABNORMAL HIGH (ref 96–112)
Chloride: 112 mEq/L (ref 96–112)
Creatinine, Ser: 2.31 mg/dL — ABNORMAL HIGH (ref 0.50–1.10)
Creatinine, Ser: 2.11 mg/dL — ABNORMAL HIGH (ref 0.50–1.10)
GFR calc Af Amer: 22 mL/min — ABNORMAL LOW (ref >90)
GFR calc Af Amer: 25 mL/min — ABNORMAL LOW (ref >90)
Glucose, Bld: 92 mg/dL (ref 70–99)
Glucose, Bld: 101 mg/dL — ABNORMAL HIGH (ref 70–99)
Potassium: 4.6 mEq/L (ref 3.5–5.1)
Potassium: 4 mEq/L (ref 3.5–5.1)
Sodium: 141 mEq/L (ref 135–145)

## 2012-12-22 LAB — CREATININE, SERUM: Creatinine, Ser: 2.19 mg/dL — ABNORMAL HIGH (ref 0.50–1.10)

## 2012-12-22 LAB — URINE CULTURE: Colony Count: NO GROWTH

## 2012-12-22 LAB — PROTIME-INR: INR: 1.13 (ref 0.00–1.49)

## 2012-12-22 SURGERY — INSERTION, ENDOVASCULAR STENT GRAFT, AORTA, ABDOMINAL
Anesthesia: General | Site: Abdomen | Wound class: Clean

## 2012-12-22 MED ORDER — PROTAMINE SULFATE 10 MG/ML IV SOLN
INTRAVENOUS | Status: DC | PRN
  Administered 2012-12-22 (×3): 10 mg via INTRAVENOUS

## 2012-12-22 MED ORDER — DOCUSATE SODIUM 100 MG PO CAPS
100.0000 mg | ORAL_CAPSULE | Freq: Every day | ORAL | Status: DC
  Administered 2012-12-23: 100 mg via ORAL
  Filled 2012-12-22: qty 1

## 2012-12-22 MED ORDER — HYDRALAZINE HCL 20 MG/ML IJ SOLN
10.0000 mg | INTRAMUSCULAR | Status: DC | PRN
  Administered 2012-12-22 (×2): 10 mg via INTRAVENOUS
  Filled 2012-12-22: qty 0.5
  Filled 2012-12-22: qty 1

## 2012-12-22 MED ORDER — OXYCODONE HCL 5 MG PO TABS: 5.0000 mg | ORAL_TABLET | Freq: Once | ORAL | Status: DC | PRN

## 2012-12-22 MED ORDER — PANTOPRAZOLE SODIUM 40 MG PO TBEC
40.0000 mg | DELAYED_RELEASE_TABLET | Freq: Every day | ORAL | Status: DC
  Administered 2012-12-23: 40 mg via ORAL
  Filled 2012-12-22: qty 1

## 2012-12-22 MED ORDER — DEXTROSE 5 % IV SOLN
1.5000 g | Freq: Two times a day (BID) | INTRAVENOUS | Status: AC
  Administered 2012-12-22 – 2012-12-23 (×2): 1.5 g via INTRAVENOUS
  Filled 2012-12-22 (×2): qty 1.5

## 2012-12-22 MED ORDER — MORPHINE SULFATE 2 MG/ML IJ SOLN: 2.0000 mg | INTRAMUSCULAR | Status: DC | PRN

## 2012-12-22 MED ORDER — ENOXAPARIN SODIUM 30 MG/0.3ML ~~LOC~~ SOLN
30.0000 mg | SUBCUTANEOUS | Status: DC
  Filled 2012-12-22: qty 0.3

## 2012-12-22 MED ORDER — HEPARIN SODIUM (PORCINE) 1000 UNIT/ML IJ SOLN
INTRAMUSCULAR | Status: DC | PRN
  Administered 2012-12-22: 6 mL via INTRAVENOUS

## 2012-12-22 MED ORDER — LABETALOL HCL 5 MG/ML IV SOLN
5.0000 mg | INTRAVENOUS | Status: DC | PRN
  Administered 2012-12-22: 5 mg via INTRAVENOUS

## 2012-12-22 MED ORDER — SODIUM CHLORIDE 0.9 % IV SOLN
INTRAVENOUS | Status: DC
  Administered 2012-12-22: 17:00:00 via INTRAVENOUS

## 2012-12-22 MED ORDER — LACTATED RINGERS IV SOLN
INTRAVENOUS | Status: DC | PRN
  Administered 2012-12-22: 08:00:00 via INTRAVENOUS

## 2012-12-22 MED ORDER — SODIUM CHLORIDE 0.9 % IV SOLN: 500.0000 mL | Freq: Once | INTRAVENOUS | Status: AC | PRN

## 2012-12-22 MED ORDER — FENTANYL CITRATE 0.05 MG/ML IJ SOLN: 25.0000 ug | INTRAMUSCULAR | Status: DC | PRN

## 2012-12-22 MED ORDER — PHENOL 1.4 % MT LIQD
1.0000 | OROMUCOSAL | Status: DC | PRN
  Administered 2012-12-22: 1 via OROMUCOSAL
  Filled 2012-12-22: qty 177

## 2012-12-22 MED ORDER — IODIXANOL 320 MG/ML IV SOLN
INTRAVENOUS | Status: DC | PRN
  Administered 2012-12-22: 150 mL via INTRA_ARTERIAL

## 2012-12-22 MED ORDER — EPHEDRINE SULFATE 50 MG/ML IJ SOLN
INTRAMUSCULAR | Status: DC | PRN
  Administered 2012-12-22: 5 mg via INTRAVENOUS
  Administered 2012-12-22: 10 mg via INTRAVENOUS

## 2012-12-22 MED ORDER — MEPERIDINE HCL 25 MG/ML IJ SOLN: 6.2500 mg | INTRAMUSCULAR | Status: DC | PRN

## 2012-12-22 MED ORDER — SODIUM CHLORIDE 0.9 % IN NEBU
INHALATION_SOLUTION | RESPIRATORY_TRACT | Status: AC
  Filled 2012-12-22: qty 3

## 2012-12-22 MED ORDER — ONDANSETRON HCL 4 MG/2ML IJ SOLN: 4.0000 mg | Freq: Four times a day (QID) | INTRAMUSCULAR | Status: DC | PRN

## 2012-12-22 MED ORDER — KETOROLAC TROMETHAMINE 0.5 % OP SOLN
1.0000 [drp] | Freq: Four times a day (QID) | OPHTHALMIC | Status: AC
  Administered 2012-12-22 – 2012-12-23 (×4): 1 [drp] via OPHTHALMIC
  Filled 2012-12-22: qty 3

## 2012-12-22 MED ORDER — ACETAMINOPHEN 325 MG PO TABS
325.0000 mg | ORAL_TABLET | ORAL | Status: DC | PRN
  Filled 2012-12-22: qty 2

## 2012-12-22 MED ORDER — DOPAMINE-DEXTROSE 3.2-5 MG/ML-% IV SOLN: 3.0000 ug/kg/min | INTRAVENOUS | Status: DC

## 2012-12-22 MED ORDER — FENTANYL CITRATE 0.05 MG/ML IJ SOLN
INTRAMUSCULAR | Status: DC | PRN
  Administered 2012-12-22: 50 ug via INTRAVENOUS
  Administered 2012-12-22: 100 ug via INTRAVENOUS

## 2012-12-22 MED ORDER — GLYCOPYRROLATE 0.2 MG/ML IJ SOLN
INTRAMUSCULAR | Status: DC | PRN
  Administered 2012-12-22: 0.6 mg via INTRAVENOUS

## 2012-12-22 MED ORDER — NEOSTIGMINE METHYLSULFATE 1 MG/ML IJ SOLN
INTRAMUSCULAR | Status: DC | PRN
  Administered 2012-12-22: 3 mg via INTRAVENOUS

## 2012-12-22 MED ORDER — PROMETHAZINE HCL 25 MG/ML IJ SOLN: 6.2500 mg | INTRAMUSCULAR | Status: DC | PRN

## 2012-12-22 MED ORDER — METOPROLOL TARTRATE 1 MG/ML IV SOLN: 2.0000 mg | INTRAVENOUS | Status: DC | PRN

## 2012-12-22 MED ORDER — POTASSIUM CHLORIDE CRYS ER 20 MEQ PO TBCR: 20.0000 meq | EXTENDED_RELEASE_TABLET | Freq: Once | ORAL | Status: AC | PRN

## 2012-12-22 MED ORDER — ACETAMINOPHEN 650 MG RE SUPP
325.0000 mg | RECTAL | Status: DC | PRN
Start: 2012-12-22 — End: 2012-12-23

## 2012-12-22 MED ORDER — CEFAZOLIN SODIUM-DEXTROSE 2-3 GM-% IV SOLR
INTRAVENOUS | Status: AC
  Filled 2012-12-22: qty 50

## 2012-12-22 MED ORDER — MIDAZOLAM HCL 2 MG/2ML IJ SOLN
INTRAMUSCULAR | Status: AC
  Filled 2012-12-22: qty 2

## 2012-12-22 MED ORDER — LABETALOL HCL 5 MG/ML IV SOLN
INTRAVENOUS | Status: AC
  Filled 2012-12-22: qty 4

## 2012-12-22 MED ORDER — PROPOFOL 10 MG/ML IV BOLUS
INTRAVENOUS | Status: DC | PRN
  Administered 2012-12-22: 100 mg via INTRAVENOUS

## 2012-12-22 MED ORDER — MIDAZOLAM HCL 2 MG/2ML IJ SOLN: 0.5000 mg | Freq: Once | INTRAMUSCULAR | Status: DC | PRN

## 2012-12-22 MED ORDER — OXYCODONE HCL 5 MG PO TABS: 5.0000 mg | ORAL_TABLET | ORAL | Status: DC | PRN

## 2012-12-22 MED ORDER — GUAIFENESIN-DM 100-10 MG/5ML PO SYRP: 15.0000 mL | ORAL_SOLUTION | ORAL | Status: DC | PRN

## 2012-12-22 MED ORDER — ALUM & MAG HYDROXIDE-SIMETH 200-200-20 MG/5ML PO SUSP: 15.0000 mL | ORAL | Status: DC | PRN

## 2012-12-22 MED ORDER — HYDROMORPHONE HCL PF 1 MG/ML IJ SOLN
INTRAMUSCULAR | Status: AC
  Filled 2012-12-22: qty 1

## 2012-12-22 MED ORDER — HYDRALAZINE HCL 20 MG/ML IJ SOLN
INTRAMUSCULAR | Status: AC
  Filled 2012-12-22: qty 1

## 2012-12-22 MED ORDER — OXYCODONE HCL 5 MG/5ML PO SOLN: 5.0000 mg | Freq: Once | ORAL | Status: DC | PRN

## 2012-12-22 MED ORDER — ONDANSETRON HCL 4 MG/2ML IJ SOLN
INTRAMUSCULAR | Status: DC | PRN
  Administered 2012-12-22: 4 mg via INTRAMUSCULAR

## 2012-12-22 MED ORDER — MIDAZOLAM HCL 5 MG/5ML IJ SOLN
INTRAMUSCULAR | Status: DC | PRN
  Administered 2012-12-22: 1 mg via INTRAVENOUS

## 2012-12-22 MED ORDER — SODIUM CHLORIDE 0.9 % IR SOLN
Status: DC | PRN
  Administered 2012-12-22: 08:00:00

## 2012-12-22 MED ORDER — KETOROLAC TROMETHAMINE 0.5 % OP SOLN
1.0000 [drp] | Freq: Four times a day (QID) | OPHTHALMIC | Status: DC
  Filled 2012-12-22: qty 3

## 2012-12-22 MED ORDER — 0.9 % SODIUM CHLORIDE (POUR BTL) OPTIME
TOPICAL | Status: DC | PRN
  Administered 2012-12-22: 1000 mL

## 2012-12-22 MED ORDER — ROCURONIUM BROMIDE 100 MG/10ML IV SOLN
INTRAVENOUS | Status: DC | PRN
  Administered 2012-12-22: 40 mg via INTRAVENOUS

## 2012-12-22 SURGICAL SUPPLY — 76 items
ADH SKN CLS APL DERMABOND .7 (GAUZE/BANDAGES/DRESSINGS) ×1
BAG BANDED W/RUBBER/TAPE 36X54 (MISCELLANEOUS) ×2 IMPLANT
BAG EQP BAND 135X91 W/RBR TAPE (MISCELLANEOUS) ×1
BAG SNAP BAND KOVER 36X36 (MISCELLANEOUS) ×3 IMPLANT
BLADE SURG ROTATE 9660 (MISCELLANEOUS) ×1 IMPLANT
CANISTER SUCTION 2500CC (MISCELLANEOUS) ×2 IMPLANT
CATH BEACON 5.038 65CM KMP-01 (CATHETERS) ×1 IMPLANT
CATH OMNI FLUSH .035X70CM (CATHETERS) ×1 IMPLANT
CATH VISIONS PV .035 IVUS (CATHETERS) ×1 IMPLANT
CLIP TI MEDIUM 24 (CLIP) IMPLANT
CLIP TI WIDE RED SMALL 24 (CLIP) IMPLANT
COVER DOME SNAP 22 D (MISCELLANEOUS) ×2 IMPLANT
COVER MAYO STAND STRL (DRAPES) ×3 IMPLANT
COVER PROBE W GEL 5X96 (DRAPES) ×2 IMPLANT
COVER SURGICAL LIGHT HANDLE (MISCELLANEOUS) ×2 IMPLANT
DERMABOND ADVANCED (GAUZE/BANDAGES/DRESSINGS) ×1
DERMABOND ADVANCED .7 DNX12 (GAUZE/BANDAGES/DRESSINGS) ×1 IMPLANT
DEVICE CLOSURE PERCLS PRGLD 6F (VASCULAR PRODUCTS) IMPLANT
DRAPE TABLE COVER HEAVY DUTY (DRAPES) ×2 IMPLANT
DRESSING OPSITE X SMALL 2X3 (GAUZE/BANDAGES/DRESSINGS) ×4 IMPLANT
DRYSEAL FLEXSHEATH 12FR 33CM (SHEATH) ×1
DRYSEAL FLEXSHEATH 18FR 33CM (SHEATH) ×1
ELECT CAUTERY BLADE 6.4 (BLADE) IMPLANT
ELECT REM PT RETURN 9FT ADLT (ELECTROSURGICAL) ×4
ELECTRODE REM PT RTRN 9FT ADLT (ELECTROSURGICAL) ×2 IMPLANT
EXCLUDER TNK LEG 31MX14X13 (Endovascular Graft) IMPLANT
EXCLUDER TRUNK LEG 31MX14X13 (Endovascular Graft) ×2 IMPLANT
GAUZE SPONGE 2X2 8PLY STRL LF (GAUZE/BANDAGES/DRESSINGS) ×1 IMPLANT
GLOVE ECLIPSE 7.5 STRL STRAW (GLOVE) ×2 IMPLANT
GLOVE SS BIOGEL STRL SZ 7 (GLOVE) ×1 IMPLANT
GLOVE SUPERSENSE BIOGEL SZ 7 (GLOVE) ×1
GOWN STRL NON-REIN LRG LVL3 (GOWN DISPOSABLE) ×6 IMPLANT
GOWN STRL REIN XL XLG (GOWN DISPOSABLE) ×2 IMPLANT
GRAFT BALLN CATH 65CM (STENTS) IMPLANT
KIT BASIN OR (CUSTOM PROCEDURE TRAY) ×2 IMPLANT
KIT ROOM TURNOVER OR (KITS) ×2 IMPLANT
LEG CONTRALATERAL 16X20X9.5 (Endovascular Graft) ×2 IMPLANT
LEG CONTRALETERAL16X16X11.5 (Endovascular Graft) ×2 IMPLANT
NDL PERC 18GX7CM (NEEDLE) ×1 IMPLANT
NEEDLE PERC 18GX7CM (NEEDLE) ×2 IMPLANT
NS IRRIG 1000ML POUR BTL (IV SOLUTION) ×2 IMPLANT
PACK AORTA (CUSTOM PROCEDURE TRAY) ×2 IMPLANT
PAD ARMBOARD 7.5X6 YLW CONV (MISCELLANEOUS) ×4 IMPLANT
PENCIL BUTTON HOLSTER BLD 10FT (ELECTRODE) IMPLANT
PERCLOSE PROGLIDE 6F (VASCULAR PRODUCTS) ×8
PROTECTION STATION PRESSURIZED (MISCELLANEOUS) ×2
SHEATH AVANTI 11CM 8FR (MISCELLANEOUS) ×1 IMPLANT
SHEATH BRITE TIP 8FR 23CM (MISCELLANEOUS) ×1 IMPLANT
SHEATH DRYSEAL FLEX 12FR 33CM (SHEATH) IMPLANT
SHEATH DRYSEAL FLEX 18FR 33CM (SHEATH) IMPLANT
SLEEVE SURGEON STRL (DRAPES) ×1 IMPLANT
SPONGE GAUZE 2X2 STER 10/PKG (GAUZE/BANDAGES/DRESSINGS) ×1
STATION PROTECTION PRESSURIZED (MISCELLANEOUS) IMPLANT
STENT GRAFT BALLN CATH 65CM (STENTS)
STENT GRAFT CONTRALAT 16X11.5 (Endovascular Graft) IMPLANT
STENT GRAFT CONTRALAT 16X20X9. (Endovascular Graft) IMPLANT
STOPCOCK MORSE 400PSI 3WAY (MISCELLANEOUS) ×2 IMPLANT
SUT PROLENE 5 0 C 1 24 (SUTURE) IMPLANT
SUT PROLENE 5 0 CC 1 (SUTURE) IMPLANT
SUT PROLENE 6 0 C 1 30 (SUTURE) IMPLANT
SUT VIC AB 2-0 CT1 27 (SUTURE)
SUT VIC AB 2-0 CT1 TAPERPNT 27 (SUTURE) IMPLANT
SUT VIC AB 3-0 SH 27 (SUTURE)
SUT VIC AB 3-0 SH 27X BRD (SUTURE) IMPLANT
SUT VICRYL 4-0 PS2 18IN ABS (SUTURE) ×4 IMPLANT
SYR 20CC LL (SYRINGE) ×4 IMPLANT
SYR 30ML LL (SYRINGE) IMPLANT
SYR 5ML LL (SYRINGE) IMPLANT
SYR MEDRAD MARK V 150ML (SYRINGE) ×2 IMPLANT
SYRINGE 10CC LL (SYRINGE) ×4 IMPLANT
TOWEL OR 17X24 6PK STRL BLUE (TOWEL DISPOSABLE) ×4 IMPLANT
TOWEL OR 17X26 10 PK STRL BLUE (TOWEL DISPOSABLE) ×5 IMPLANT
TRAY FOLEY CATH 16FRSI W/METER (SET/KITS/TRAYS/PACK) ×2 IMPLANT
TUBING HIGH PRESSURE 120CM (CONNECTOR) ×2 IMPLANT
WIRE AMPLATZ SS-J .035X180CM (WIRE) ×2 IMPLANT
WIRE BENTSON .035X145CM (WIRE) ×2 IMPLANT

## 2012-12-22 NOTE — Anesthesia Preprocedure Evaluation (Addendum)
Anesthesia Evaluation  Patient identified by MRN, date of birth, ID band Patient awake    Reviewed: Allergy & Precautions, H&P , NPO status , Patient's Chart, lab work & pertinent test results  History of Anesthesia Complications Negative for: history of anesthetic complications  Airway Mallampati: II TM Distance: >3 FB Neck ROM: Full    Dental  (+) Poor Dentition and Dental Advisory Given   Pulmonary former smoker,  breath sounds clear to auscultation  Pulmonary exam normal       Cardiovascular hypertension, Pt. on medications - angina+ CAD, + Past MI, + Cardiac Stents and + Peripheral Vascular Disease Rhythm:Regular Rate:Normal  9/14 stress test: no ischemia, normal perfusion, EF 64%   Neuro/Psych negative neurological ROS     GI/Hepatic negative GI ROS, Neg liver ROS,   Endo/Other  Hypothyroidism   Renal/GU Renal InsufficiencyRenal disease (creat 2.3)     Musculoskeletal   Abdominal   Peds  Hematology  (+) Blood dyscrasia (Hb 10.6), anemia ,   Anesthesia Other Findings   Reproductive/Obstetrics                          Anesthesia Physical Anesthesia Plan  ASA: III  Anesthesia Plan: General   Post-op Pain Management:    Induction: Intravenous  Airway Management Planned: Oral ETT  Additional Equipment: Arterial line, CVP and Ultrasound Guidance Line Placement  Intra-op Plan:   Post-operative Plan: Extubation in OR  Informed Consent: I have reviewed the patients History and Physical, chart, labs and discussed the procedure including the risks, benefits and alternatives for the proposed anesthesia with the patient or authorized representative who has indicated his/her understanding and acceptance.   Dental advisory given  Plan Discussed with: CRNA, Anesthesiologist and Surgeon  Anesthesia Plan Comments: (Plan routine monitors, A line, CVP, GETA )       Anesthesia Quick  Evaluation

## 2012-12-22 NOTE — Progress Notes (Signed)
Pt seen and eval by Dr.Lawson, no new orders rec'd, will cont to assess

## 2012-12-22 NOTE — Transfer of Care (Signed)
Immediate Anesthesia Transfer of Care Note  Patient: [name]  Procedure(s) Performed: Procedure(s) with comments: ABDOMINAL AORTIC ENDOVASCULAR STENT GRAFT- GORE (N/A) - Ultrasound guided INTRAVASCULAR ULTRASOUND (N/A)  Patient Location: PACU  Anesthesia Type:General  Level of Consciousness: awake, alert , oriented and patient cooperative  Airway & Oxygen Therapy: Patient Spontanous Breathing and Patient connected to nasal cannula oxygen  Post-op Assessment: Report given to PACU RN, Post -op Vital signs reviewed and stable and Patient moving all extremities X 4  Post vital signs: Reviewed and stable  Complications: No apparent anesthesia complications

## 2012-12-22 NOTE — Progress Notes (Signed)
  VASCULAR SURGERY EVAR DAILY PROGRESS NOTE   Day of Surgery - post-op check  SUBJECTIVE: Pt doing well No complaints.SBP a-line higher than cuff  PHYSICAL EXAM: BP Readings from Last 3 Encounters:  12/22/12 138/61  12/22/12 138/61  12/07/12 134/78   Temp Readings from Last 3 Encounters:  12/22/12 98 F (36.7 C)   12/22/12 98 F (36.7 C)   11/30/12 98.2 F (36.8 C) Oral   Pulse Readings from Last 3 Encounters:  12/22/12 57  12/22/12 57  12/07/12 74   SpO2 Readings from Last 3 Encounters:  12/22/12 96%  12/22/12 96%  12/07/12 98%     Intake/Output Summary (Last 24 hours) at 12/22/12 1443 Last data filed at 12/22/12 1230  Gross per 24 hour  Intake   1940 ml  Output   2090 ml  Net   -150 ml    Abdomen non-tender,non-distended Bilateral groins soft without hematoma 2+ Femoral pulses palpable Extremities: Incisions clean, dry and intact DP palpable bilat Feet pink and warm  LABS: Lab Results  Component Value Date   WBC 7.1 12/22/2012   HGB 9.9* 12/22/2012   HCT 29.4* 12/22/2012   MCV 90.7 12/22/2012   PLT 179 12/22/2012   Lab Results  Component Value Date   CREATININE 2.11* 12/22/2012    ASSESSMENT: Day of Surgery s/p EVAR - doing well Hypertension per a-line - lower per cuff  PLAN:  Ambulate in am  GU: Foley out POD 1  DVT prophylaxis: SCD   Disposition:  Will likely go :Home in am if ambulates, voids and takes PO.

## 2012-12-22 NOTE — Interval H&P Note (Signed)
History and Physical Interval Note:  12/22/2012 8:18 AM  [name]  has presented today for surgery, with the diagnosis of Abdominal Aortic Aneurysm   The various methods of treatment have been discussed with the patient and family. After consideration of risks, benefits and other options for treatment, the patient has consented to  Procedure(s): ABDOMINAL AORTIC ENDOVASCULAR STENT GRAFT- GORE (N/A) as a surgical intervention .  The patient's history has been reviewed, patient examined, no change in status, stable for surgery.  I have reviewed the patient's chart and labs.  Questions were answered to the patient's satisfaction.     [name]

## 2012-12-22 NOTE — Op Note (Signed)
OPERATIVE REPORT  Date of Surgery: 12/21/2012 - 12/22/2012  Surgeon: [name], MD Dr. Durene Cal  Pre-op Diagnosis: Abdominal Aortic Aneurysm with left renal artery occlusion and  atrophic left kidney Post-op Diagnosis: Same   Procedure: Procedure(s): #1 bilateral percutaneous femoral artery access with closure using Pro-glide device x2 on each side #2 insertion aorto by common iliac Gore excluder C.-3 device using                 A 31 x 14 x 13 cm main body-right            B 16 x 11.5 cm contralateral limb-left             C 20 mm x 9.5 cm extender-right #3 completion angiography using 10 cc of contrast #4 remainder of angiography done intraoperatively was using CO2 #5 intravascular ultrasound localization of right renal artery and measurement of proximal aortic neck Anesthesia: General  EBL: 100 cc  Complications: None  Procedure Details: Patient to the operating room placed in supine position at which time satisfactory general endotracheal anesthesia was measured. The abdomen and groins were prepped Betadine scrub and solution draped in routine sterile manner. Appropriate monitoring lines were placed by anesthesia preoperatively. Using ultrasound guidance-SonoSite both common femoral arteries were entered percutaneously guidewire passed into the aorta and after placement of 2 Pro-glide closure device sutures on each side at the 10 and 2:00 positions a 8 mm sheath was placed on the right and a longer 8 mm sheath placed on the left. Patient was then heparinized. The main body was to be inserted through the right femoral access. 8 French sheath was exchanged for an 82 French sheath over an Amplatz superstiff wire. A pigtail catheter was positioned in the suprarenal aorta via the left side. The patient had a known nonfunctioning left kidney and had a stent in the origin of the right renal artery making placement of the proximal portion of the graft easier because of the stent. Angiogram  was then performed using CO2 which confirmed the location of the right renal artery and the angulation of the neck. The main body was then inserted through the 18 mm sheath on the right. The graft selected was a 31 mm x 14 mm x 13 cm graft. It was positioned in the infrarenal aorta. The neck traversed horizontally to the right and then turned abruptly inferiorly. Graft was initially positioned just below the right renal stent and was deployed. There was some angulation because of the angle neck and was decided to move the graft distally a few centimeters. This still allowed at least 3 cm of coverage in the straight portion of the neck. Therefore the graft-C3-excluder was reconstructed in about 1-2 cm distally and re\re deployed. Following this second angiogram was performed with carbon dioxide revealing good position of the graft proximally. Graft was then deployed down to the contralateral gate. Using the 8 mm sheath and pigtail catheter the gate was cannulated a Benson wire passed into the suprarenal aorta. Trachea 51 catheter was then placed at the proximal graft deployment site and this was dilated sequentially. For proceeding any further a limited angiogram was performed injecting 10 cc of contrast at 10 cc per second checking for any type of proximal leak. This revealed the graft to be in excellent position with no type I leak. Following this a retrograde study was performed on the left to get adequate measurements for the contralateral limb. This was done using carbon dioxide through  the sheath. A 16 mm x 11.5 cm contralateral limb was selected and deployed appropriately just proximal to the hypogastric which was felt to be occluded. Attention was then turned to the ipsilateral leg which was clearly deployed into the proximal right common iliac artery. Retrograde study was performed through the sheath on the right to localize the origin of the hypogastric. A 20 mm x 9.5 cm-Bill bone graft was then deployed  being careful not to cover the hypogastric distally following completion of this all junction the dilated and 5050 balloon. The graft was in excellent position the completion angiogram was CO2 revealed good filling of the right hypogastric and no evidence of leak. Following this the sheaths were removed over Bentson wires in the Pro-glide sutures deployed appropriately with satisfactory closure and no evidence of any bleeding. Protamine was then given to reverse the heparin was closed with 2 simple 4-0 Vicryl subcuticular sutures sterile dressing applied patient taken to recovery in stable condition. She had 3+ dorsalis pedis pulses palpable the end of the procedure. She did have moderate urinary output throughout and the estimated blood loss was approximately 100 cc. She was stable hemodynamically throughout the procedure per   [name], MD 12/22/2012 11:46 AM

## 2012-12-22 NOTE — Preoperative (Signed)
Beta Blockers   Reason not to administer Beta Blockers:Not Applicable 

## 2012-12-22 NOTE — Progress Notes (Signed)
Pt seen by Dr.jackson, discussed elevated BP at 182/82, new orders rec'd for iv labetolol, will administer and cont to monitor

## 2012-12-22 NOTE — Anesthesia Postprocedure Evaluation (Signed)
  Anesthesia Post-op Note  Patient: [name]  Procedure(s) Performed: Procedure(s) with comments: ABDOMINAL AORTIC ENDOVASCULAR STENT GRAFT- GORE (N/A) - Ultrasound guided INTRAVASCULAR ULTRASOUND (N/A)  Patient Location: PACU  Anesthesia Type:General  Level of Consciousness: awake, alert , oriented and patient cooperative  Airway and Oxygen Therapy: Patient Spontanous Breathing and Patient connected to nasal cannula oxygen  Post-op Pain: none  Post-op Assessment: Post-op Vital signs reviewed, Patient's Cardiovascular Status Stable, Respiratory Function Stable, Patent Airway, No signs of Nausea or vomiting and Pain level controlled  Post-op Vital Signs: Reviewed and stable  Complications: No apparent anesthesia complications

## 2012-12-22 NOTE — H&P (View-Only) (Signed)
Subjective:     Patient ID: [name], female   DOB: April 03, 1934, 78 y.o.   MRN: [id number]  HPI this 78 year old female returns today to discuss treatment of her 5.2 cm abdominal aortic aneurysm. She had CO2 angiography performed by Dr. Myra Gianotti to better delineate the anatomy of her aneurysm. She also had a nuclear medicine Cardiolite exam which revealed an ejection fraction of 65% no evidence of ischemia. She denies any recent chest pain or dyspnea on exertion. She would like to proceed with stent grafting of her aneurysm as soon as possible  Past Medical History  Diagnosis Date  . CAD (coronary artery disease)     50% proximal LAD stenosis, 50% marginal stenosis of the circumflex, RCA 95% stenosis, EF 40% stenosis with hypokinesis of the interior wall.  She had a Taxus stent placed in 04/16/2006  . Hypertension   . Abdominal aortic aneurysm   . Renal artery stenosis     S/P stenting of the right renal atrophic left renal  . Dyslipidemia   . Hypothyroidism   . Tobacco user     Previous  . Adenomatous polyp 2004, 2011  . Hyperplastic colon polyp 2004  . Hemorrhoids   . Diverticulosis   . Renal insufficiency   . Anemia   . Myocardial infarction     History  Substance Use Topics  . Smoking status: Former Smoker -- 1 years    Types: Cigarettes    Quit date: 03/17/2006  . Smokeless tobacco: Never Used  . Alcohol Use: No    Family History  Problem Relation Age of Onset  . Uterine cancer Mother   . Heart attack Father 77  . Heart disease Father     before age 39  . Peripheral vascular disease Sister   . Other Sister     Renal artery stenosis  . Heart disease Sister     before age 48  . Heart attack Brother 42  . Heart disease Brother     before age 65  . Cancer Brother     lung  . Peripheral vascular disease Sister   . Colon cancer Neg Hx   . Cancer Maternal Aunt     breast  . Cancer Maternal Aunt     breast    Allergies  Allergen Reactions  . Sulfonamide  Derivatives Hives    Current outpatient prescriptions:aspirin 81 MG tablet, Take 81 mg by mouth daily.  , Disp: , Rfl: ;  citalopram (CELEXA) 20 MG tablet, Take 20 mg by mouth daily. , Disp: , Rfl: ;  furosemide (LASIX) 40 MG tablet, Take 0.5 tablets (20 mg total) by mouth daily. 1 tab daily, Disp: , Rfl: ;  hydrocortisone (ANUSOL-HC) 25 MG suppository, Place 25 mg rectally at bedtime. , Disp: , Rfl:  levothyroxine (SYNTHROID, LEVOTHROID) 50 MCG tablet, Take 50 mcg by mouth daily.  , Disp: , Rfl: ;  NIFEdipine (PROCARDIA XL/ADALAT-CC) 60 MG 24 hr tablet, Take 60 mg by mouth daily., Disp: , Rfl: ;  nitroGLYCERIN (NITROSTAT) 0.4 MG SL tablet, Place 0.4 mg under the tongue every 5 (five) minutes as needed for chest pain., Disp: , Rfl: ;  Omega-3-acid Ethyl Esters (LOVAZA PO), Take 1 g by mouth daily. , Disp: , Rfl:  Pitavastatin Calcium (LIVALO) 2 MG TABS, Take 2 mg by mouth daily. , Disp: , Rfl: ;  pravastatin (PRAVACHOL) 80 MG tablet, Take 80 mg by mouth daily. , Disp: , Rfl:   BP 134/78  Pulse 74  Resp 16  Ht 5\' 5"  (1.651 m)  Wt 143 lb (64.864 kg)  BMI 23.8 kg/m2  SpO2 98%  Body mass index is 23.8 kg/(m^2).          Review of Systems     Objective:   Physical Exam BP 134/78  Pulse 74  Resp 16  Ht 5\' 5"  (1.651 m)  Wt 143 lb (64.864 kg)  BMI 23.8 kg/m2  SpO2 98%  A well-developed well-nourished female no apparent stress alert and oriented x3 Lungs no rhonchi or wheezing Abdomen soft nontender with 5 cm pulsatile mass 3+ femoral popliteal and dorsalis pedis pulses palpable bilaterally.       Assessment:     5.2 cm abdominal aortic aneurysm with atrophic left kidney and previous stent in right renal artery Chronic renal insufficiency with creatinine 2.5-3.0 range  Discussed at length with patient possibility of worsening of renal function and possibility of hemodialysis being initiated. Will limit contrast as much as possible and use primarily CO2 and IVUS  Risks  thoroughly discussed with her and she would like to proceed     Plan:     Plan aortic stent grafting of infrarenal abdominal aortic aneurysm on Wednesday, October 8  Will admit patient day prior for hydration for protection of renal function

## 2012-12-22 NOTE — Anesthesia Procedure Notes (Signed)
Procedure Name: Intubation Date/Time: 12/22/2012 8:47 AM Performed by: Rogelia Boga Pre-anesthesia Checklist: Patient identified, Emergency Drugs available, Suction available, Patient being monitored and Timeout performed Patient Re-evaluated:Patient Re-evaluated prior to inductionOxygen Delivery Method: Circle system utilized Preoxygenation: Pre-oxygenation with 100% oxygen Intubation Type: IV induction Ventilation: Mask ventilation without difficulty and Oral airway inserted - appropriate to patient size Laryngoscope Size: Mac and 4 Grade View: Grade I Tube type: Oral Tube size: 7.5 mm Number of attempts: 1 Airway Equipment and Method: Stylet Placement Confirmation: ETT inserted through vocal cords under direct vision,  positive ETCO2 and breath sounds checked- equal and bilateral Secured at: 19 cm Tube secured with: Tape Dental Injury: Teeth and Oropharynx as per pre-operative assessment

## 2012-12-23 ENCOUNTER — Other Ambulatory Visit: Payer: Self-pay | Admitting: *Deleted

## 2012-12-23 ENCOUNTER — Telehealth: Payer: Self-pay | Admitting: Vascular Surgery

## 2012-12-23 DIAGNOSIS — Z48812 Encounter for surgical aftercare following surgery on the circulatory system: Secondary | ICD-10-CM

## 2012-12-23 DIAGNOSIS — I714 Abdominal aortic aneurysm, without rupture: Secondary | ICD-10-CM

## 2012-12-23 LAB — CBC
HCT: 26.5 % — ABNORMAL LOW (ref 36.0–46.0)
MCH: 31 pg (ref 26.0–34.0)
MCHC: 33.6 g/dL (ref 30.0–36.0)
MCV: 92.3 fL (ref 78.0–100.0)
Platelets: 156 10*3/uL (ref 150–400)
RDW: 13.6 % (ref 11.5–15.5)

## 2012-12-23 LAB — BASIC METABOLIC PANEL
BUN: 19 mg/dL (ref 6–23)
Calcium: 7.6 mg/dL — ABNORMAL LOW (ref 8.4–10.5)
Creatinine, Ser: 2.35 mg/dL — ABNORMAL HIGH (ref 0.50–1.10)
GFR calc Af Amer: 22 mL/min — ABNORMAL LOW (ref >90)
Glucose, Bld: 98 mg/dL (ref 70–99)
Potassium: 4 mEq/L (ref 3.5–5.1)

## 2012-12-23 MED ORDER — OXYCODONE HCL 5 MG PO TABS: 5.0000 mg | ORAL_TABLET | Freq: Four times a day (QID) | ORAL | Status: DC | PRN

## 2012-12-23 MED ORDER — PNEUMOCOCCAL VAC POLYVALENT 25 MCG/0.5ML IJ INJ
0.5000 mL | INJECTION | Freq: Once | INTRAMUSCULAR | Status: AC
  Administered 2012-12-23: 0.5 mL via INTRAMUSCULAR
  Filled 2012-12-23: qty 0.5

## 2012-12-23 MED ORDER — INFLUENZA VAC SPLIT QUAD 0.5 ML IM SUSP
0.5000 mL | INTRAMUSCULAR | Status: AC
  Administered 2012-12-23: 0.5 mL via INTRAMUSCULAR

## 2012-12-23 NOTE — Telephone Encounter (Addendum)
Message copied by Rosalyn Charters on Thu Dec 23, 2012  3:07 PM ------      Message from: [place], [place] K      Created: Thu Dec 23, 2012  9:32 AM      Regarding: schedule       CTA order is in.             ----- Message -----         From: Dara Lords, PA-C         Sent: 12/22/2012  11:47 AM           To: Sharee Pimple, CMA            S/p EVAR 12/22/12 by Dr. Hart Rochester.  F/u with him in 4 weeks.            Thanks,      Lelon Mast       ------  notified patient of appt. on 01-25-13 at 9:30 for a cta abd/pelvis at [facility] and then to see dr. Hart Rochester at 10:45.  mailed instructions

## 2012-12-23 NOTE — Discharge Summary (Signed)
[facility] Discharge Summary   Patient ID:  [name] MRN: [id number] DOB/AGE: 76/20/1936 76 y.o.  Admit date: 12/21/2012 Discharge date: 12/23/2012 Date of Surgery: 12/21/2012 - 12/22/2012 Surgeon: Surgeon(s): Pryor Ochoa, MD Nada Libman, MD  Admission Diagnosis: AAA Renal Insufficiency Abdominal Aortic Aneurysm   Discharge Diagnoses:  AAA Renal Insufficiency Abdominal Aortic Aneurysm   Secondary Diagnoses: Past Medical History  Diagnosis Date  . CAD (coronary artery disease)     50% proximal LAD stenosis, 50% marginal stenosis of the circumflex, RCA 95% stenosis, EF 40% stenosis with hypokinesis of the interior wall.  She had a Taxus stent placed in 04/16/2006  . Hypertension   . Abdominal aortic aneurysm   . Renal artery stenosis     S/P stenting of the right renal atrophic left renal  . Dyslipidemia   . Hypothyroidism   . Tobacco user     Previous  . Adenomatous polyp 2004, 2011  . Hyperplastic colon polyp 2004  . Hemorrhoids   . Diverticulosis   . Renal insufficiency   . Anemia   . Myocardial infarction     stent 2008    Procedure(s): ABDOMINAL AORTIC ENDOVASCULAR STENT GRAFT- GORE INTRAVASCULAR ULTRASOUND  Discharged Condition: good  HPI: 78 year old female returns for further discussion regarding her abdominal aortic aneurysm. She has known renal insufficiency with creatinine of 2.5 followed by Dr. Lowell Guitar. Her aneurysm has gradually enlarged and today on CT scan the aneurysm measures 5.2 cm in maximum diameter. Patient has no function in her left kidney and has had a previous stent in her right renal artery. She has been evaluated by the transplant service at [facility] but is not needed transplant obviously at this time. She has been thought to be a borderline stent-graft candidate in the past but concern has been contrast induced nephropathy which could accelerate her need for hemodialysis. She understands this. She is very concerned  about the aneurysm enlarging.   She underwent EVAR repair of her AAA 12/22/2012 by Dr. [name].  Her post operative period was uneventful.  She has had stable cuff blood pressures without complications.  She will need to walk and void independently prior to going home today.     Hospital Course:  [name] is a 78 y.o. female is S/P Neither Procedure(s): ABDOMINAL AORTIC ENDOVASCULAR STENT GRAFT- GORE INTRAVASCULAR ULTRASOUND Extubated: POD # 0 Physical exam: palpable DP/PT pulses 3+ Post-op wounds clean, dry, intact or healing well Pt. Ambulating, voiding and taking PO diet without difficulty. Pt pain controlled with PO pain meds. Labs as below Complications:none  Consults:     Significant Diagnostic Studies: CBC Lab Results  Component Value Date   WBC 7.9 12/23/2012   HGB 8.9* 12/23/2012   HCT 26.5* 12/23/2012   MCV 92.3 12/23/2012   PLT 156 12/23/2012    BMET    Component Value Date/Time   NA 142 12/23/2012 0425   K 4.0 12/23/2012 0425   CL 112 12/23/2012 0425   CO2 20 12/23/2012 0425   GLUCOSE 98 12/23/2012 0425   BUN 19 12/23/2012 0425   CREATININE 2.35* 12/23/2012 0425   CALCIUM 7.6* 12/23/2012 0425   GFRNONAA 19* 12/23/2012 0425   GFRAA 22* 12/23/2012 0425   COAG Lab Results  Component Value Date   INR 1.13 12/22/2012   INR 1.05 12/21/2012   INR 1.2 10/08/2006     Disposition:  Discharge to :Home Discharge Orders   Future Orders Complete By Expires  Call MD for:  redness, tenderness, or signs of infection (pain, swelling, bleeding, redness, odor or green/yellow discharge around incision site)  As directed    Call MD for:  severe or increased pain, loss or decreased feeling  in affected limb(s)  As directed    Call MD for:  temperature >100.5  As directed    Driving Restrictions  As directed    Comments:     No driving for 1 week   Increase activity slowly  As directed    Comments:     Walk with assistance use walker or cane as needed   Lifting  restrictions  As directed    Comments:     No lifting for 6 weeks   May shower   As directed    Scheduling Instructions:     Remove dressings from groin and right side of neck in 24 hours and may shower.  Bandaide over groin incisions if drainage.   Resume previous diet  As directed        Medication List         acetaminophen 325 MG tablet  Commonly known as:  TYLENOL  Take 325 mg by mouth daily as needed for pain.     aspirin EC 81 MG tablet  Take 81 mg by mouth daily.     citalopram 20 MG tablet  Commonly known as:  CELEXA  Take 20 mg by mouth daily.     Fish Oil 1000 MG Caps  Take 1,000 mg by mouth daily.     furosemide 40 MG tablet  Commonly known as:  LASIX  Take 40 mg by mouth daily.     hydrocortisone 25 MG suppository  Commonly known as:  ANUSOL-HC  Place 25 mg rectally at bedtime as needed for hemorrhoids.     levothyroxine 50 MCG tablet  Commonly known as:  SYNTHROID, LEVOTHROID  Take 50 mcg by mouth daily.     NIFEdipine 60 MG 24 hr tablet  Commonly known as:  PROCARDIA XL/ADALAT-CC  Take 60 mg by mouth daily.     nitroGLYCERIN 0.4 MG SL tablet  Commonly known as:  NITROSTAT  Place 0.4 mg under the tongue every 5 (five) minutes as needed for chest pain.     oxyCODONE 5 MG immediate release tablet  Commonly known as:  Oxy IR/ROXICODONE  Take 1 tablet (5 mg total) by mouth every 6 (six) hours as needed for pain.     pravastatin 80 MG tablet  Commonly known as:  PRAVACHOL  Take 80 mg by mouth at bedtime.       Verbal and written Discharge instructions given to the patient. Wound care per Discharge AVS     Follow-up Information   Follow up with [name], MD In 4 weeks. (Office will call you to arrange your appt (sent))    Specialty:  Vascular Surgery   Contact information:   [address] [place] [address] [phone]       Signed: Clinton Gallant [facility] 12/23/2012, 7:32 AM  - For VQI Registry use --- Instructions: Press F2 to  tab through selections.  Delete question if not applicable.   Post-op:  Time to Extubation: [x ] In OR, [ ]  < 12 hrs, [ ]  12-24 hrs, [ ]  >=24 hrs Vasopressors Req. Post-op: No MI: [x ] No, [ ]  Troponin only, [ ]  EKG or Clinical New Arrhythmia: No CHF: No ICU Stay: 1 days Transfusion: No  If yes, 0 units given  Complications: Resp failure: [x ] none, [ ]  Pneumonia, [ ]  Ventilator Chg in renal function: [x ] none, [ ]  Inc. Cr > 0.5, [ ]  Temp. Dialysis, [ ]  Permanent dialysis Leg ischemia: [x ] No, [ ]  Yes, no Surgery needed, [ ]  Yes, Surgery needed, [ ]  Amputation Bowel ischemia: [x ] No, [ ]  Medical Rx, [ ]  Surgical Rx Wound complication: [x ] No, [ ]  Superficial separation/infection, [ ]  Return to OR Return to OR: No  Return to OR for bleeding: No Stroke: [ ]  None, [ ]  Minor, [ ]  Major  Discharge medications: Statin use:  Yes ASA use:  Yes Plavix use:  No  for medical reason   Beta blocker use:  No  for medical reason   Agree with above  DC home today

## 2012-12-23 NOTE — Progress Notes (Addendum)
[facility] [place]  Post-op EVAR Date of Surgery: 12/21/2012 - 12/22/2012 Surgeon: Moishe Spice): Pryor Ochoa, MD Nada Libman, MD POD: 1 Day Post-Op Device:#1 bilateral percutaneous femoral artery access with closure using Pro-glide device x2 on each side  #2 insertion aorto by common iliac Gore excluder C.-3 device using   Endoleak: No  History of Present Illness  [name] is a 78 y.o. female who is s/p EVAR. The patient denies back pain; denies abdominal pain; denies lower extremity pain.  He is Ambulating and taking PO well without nausea or vomiting. Pt. has not voided with foley out  IMAGING: Dg Chest Portable 1 View  12/22/2012   CLINICAL DATA:  Stent graft placement  EXAM: PORTABLE CHEST - 1 VIEW  COMPARISON:  Portable exam 1216 hr compared to 12/21/2012  FINDINGS: Right jugular line tip projects over SVC.  Enlargement of cardiac silhouette.  Tortuous aorta.  Very low lung volumes, decreased since previous exam with bibasilar atelectasis and crowding of perihilar markings.  No gross consolidation, pleural effusion or pneumothorax.  IMPRESSION: Very low lung volumes with bibasilar atelectasis.  Enlargement of cardiac silhouette.   Electronically Signed   By: Ulyses Southward M.D.   On: 12/22/2012 13:36   Dg Chest Portable 1 View  12/21/2012   CLINICAL DATA:  Initial encounter for preoperative evaluation prior to endovascular repair of abdominal aortic aneurysm. Current history of hypertension and coronary artery disease.  EXAM: PORTABLE CHEST - 1 VIEW  COMPARISON:  Two-view chest x-ray 10/08/2006, 04/16/2006.  FINDINGS: Suboptimal inspiration accounts for crowded bronchovascular markings, especially in the bases, and accentuates the cardiac silhouette. Taking this into account, cardiac silhouette mildly enlarged. Thoracic aorta atherosclerotic and tortuous. Hilar and mediastinal contours otherwise unremarkable. Mild atelectasis in the lung bases and mid lungs.  Lungs otherwise clear. Pulmonary vascularity normal. No visible pleural effusions.  IMPRESSION: Suboptimal inspiration accounts for mild atelectasis in the lung bases and the mid lungs. No acute cardiopulmonary disease otherwise.   Electronically Signed   By: Hulan Saas M.D.   On: 12/21/2012 13:45    Significant Diagnostic Studies: CBC Lab Results  Component Value Date   WBC 7.9 12/23/2012   HGB 8.9* 12/23/2012   HCT 26.5* 12/23/2012   MCV 92.3 12/23/2012   PLT 156 12/23/2012     BMET    Component Value Date/Time   NA 142 12/23/2012 0425   K 4.0 12/23/2012 0425   CL 112 12/23/2012 0425   CO2 20 12/23/2012 0425   GLUCOSE 98 12/23/2012 0425   BUN 19 12/23/2012 0425   CREATININE 2.35* 12/23/2012 0425   CALCIUM 7.6* 12/23/2012 0425   GFRNONAA 19* 12/23/2012 0425   GFRAA 22* 12/23/2012 0425    COAG Lab Results  Component Value Date   INR 1.13 12/22/2012   INR 1.05 12/21/2012   INR 1.2 10/08/2006   No results found for this basename: PTT     I/O last 3 completed shifts: In: 2180 [P.O.:480; I.V.:1700] Out: 2490 [Urine:2490] No data found.   Physical Examination  BP Readings from Last 3 Encounters:  12/23/12 149/70  12/23/12 149/70  12/07/12 134/78   Temp Readings from Last 3 Encounters:  12/23/12 98.6 F (37 C) Oral  12/23/12 98.6 F (37 C) Oral  11/30/12 98.2 F (36.8 C) Oral   SpO2 Readings from Last 3 Encounters:  12/23/12 95%  12/23/12 95%  12/07/12 98%   Pulse Readings from Last 3 Encounters:  12/23/12 76  12/23/12  76  12/07/12 74    General: A&O x 3, WDWN female in NAD Gait: Normal Pulmonary: normal non-labored breathing  Cardiac: RRR Abdomen: soft, NT, NABS Bilateral groin wounds: clean, dry, intact, without hematoma Vascular Exam/Pulses:Palpable 3+ DP/PT pulses bilateral  Extremities without ischemic changes, no Gangrene , no cellulitis; no open wounds;   Neurologic: A&O X 3; Appropriate Affect  Assessment: [name] is a 78 y.o. female who  is 1 Day Post-Op EVAR.  Pt is doing well with no complaints  Plan: Home  The importance of surveillance of the endograft was discussed with the patient  A CTA of abdomen and pelvis will be scheduled for one month to assess for endoleak.  The patient will follow up with [place] in one month with these studies.   SignedMosetta Pigeon  12/23/2012 7:27 AM.   Patient doing well one day post insertion aortic stent graft Renal insufficiency is certainly stable with creatinine 2.19 today and good urinary output Inguinal wounds are clean with no evidence of hematoma and 3+ dorsalis pedis pulse palpable bilaterally Tolerating diet well  Plan DC home today and return in 4 weeks for followup with CTA at that time

## 2012-12-23 NOTE — Progress Notes (Signed)
Pt. Discharged home; Toradol eye drops sent with patient.   Vivi Martens RN

## 2012-12-24 ENCOUNTER — Encounter (HOSPITAL_COMMUNITY): Payer: Self-pay | Admitting: Vascular Surgery

## 2013-01-24 ENCOUNTER — Encounter: Payer: Self-pay | Admitting: Vascular Surgery

## 2013-01-25 ENCOUNTER — Ambulatory Visit
Admission: RE | Admit: 2013-01-25 | Discharge: 2013-01-25 | Disposition: A | Discharge status: Home / Self Care | Payer: Medicare HMO | Source: Ambulatory Visit | Attending: Vascular Surgery | Admitting: Vascular Surgery

## 2013-01-25 ENCOUNTER — Ambulatory Visit (INDEPENDENT_AMBULATORY_CARE_PROVIDER_SITE_OTHER): Payer: Medicare HMO | Admitting: Vascular Surgery

## 2013-01-25 ENCOUNTER — Encounter: Payer: Self-pay | Admitting: Vascular Surgery

## 2013-01-25 ENCOUNTER — Other Ambulatory Visit: Payer: Self-pay | Admitting: *Deleted

## 2013-01-25 VITALS — BP 128/72 | HR 95 | Resp 16 | Ht 65.0 in | Wt 140.0 lb

## 2013-01-25 DIAGNOSIS — Z48812 Encounter for surgical aftercare following surgery on the circulatory system: Secondary | ICD-10-CM

## 2013-01-25 DIAGNOSIS — I714 Abdominal aortic aneurysm, without rupture: Secondary | ICD-10-CM

## 2013-01-25 NOTE — Addendum Note (Signed)
Addended by: Sharee Pimple on: 01/25/2013 11:46 AM   Modules accepted: Orders

## 2013-01-25 NOTE — Progress Notes (Signed)
Subjective:     Patient ID: [name], female   DOB: 01/17/1935, 78 y.o.   MRN: [id number]  HPI this 78 year old female returns 4 weeks post endovascular stent graft repair of abdominal aortic aneurysm using a Gore-Excluder-C3 graft with carbon dioxide and intravascular ultrasound and a small amount of contrast. Patient has chronic renal insufficiency. Today her creatinine is 2.36. She denies any lower extremity symptoms, chills and fever, or significant abdominal pain.   Review of Systems     Objective:   Physical Exam BP 128/72  Pulse 95  Resp 16  Ht 5\' 5"  (1.651 m)  Wt 140 lb (63.504 kg)  BMI 23.30 kg/m2  General well-developed well-nourished female in no apparent stress alert and oriented x3 Lungs no rhonchi or wheezing Abdomen soft no pulsatile mass palpable Inguinal wounds are well healed percutaneous site. Both feet well perfused.  Today I ordered a CT scan without contrast which are reviewed and interpreted by computer. The aneurysm sac is stable at 5.3 cm in maximum diameter. Graft has not migrated there is no evidence of stent fracture. Unable to assess for endoleak's because of lack of IV contrast with renal insufficiency.     Assessment:     4 weeks post EVAR for abdominal aortic aneurysm in patient with renal insufficiency-unable to give contrast for followup Renal function is stable with creatinine 2.36 No evidence of problems with stent graft at this point    Plan:     Return in 6 months with CT scan without contrast for continued followup Will then obtain duplex scan in our office to look for endoleak at next visit

## 2013-05-13 ENCOUNTER — Other Ambulatory Visit: Payer: Self-pay

## 2013-05-13 MED ORDER — FUROSEMIDE 40 MG PO TABS: 40.0000 mg | ORAL_TABLET | Freq: Every day | ORAL | Status: DC

## 2013-07-28 ENCOUNTER — Encounter: Payer: Self-pay | Admitting: *Deleted

## 2013-08-01 ENCOUNTER — Encounter: Payer: Self-pay | Admitting: Gastroenterology

## 2013-08-01 ENCOUNTER — Encounter: Payer: Self-pay | Admitting: Vascular Surgery

## 2013-08-01 ENCOUNTER — Other Ambulatory Visit (INDEPENDENT_AMBULATORY_CARE_PROVIDER_SITE_OTHER): Payer: Medicare HMO

## 2013-08-01 ENCOUNTER — Ambulatory Visit (INDEPENDENT_AMBULATORY_CARE_PROVIDER_SITE_OTHER): Payer: Commercial Managed Care - HMO | Admitting: Gastroenterology

## 2013-08-01 VITALS — BP 94/64 | HR 76 | Ht 65.0 in | Wt 140.2 lb

## 2013-08-01 DIAGNOSIS — R110 Nausea: Secondary | ICD-10-CM | POA: Insufficient documentation

## 2013-08-01 DIAGNOSIS — K5900 Constipation, unspecified: Secondary | ICD-10-CM

## 2013-08-01 DIAGNOSIS — E039 Hypothyroidism, unspecified: Secondary | ICD-10-CM | POA: Insufficient documentation

## 2013-08-01 LAB — TSH: TSH: 3.37 u[IU]/mL (ref 0.35–4.50)

## 2013-08-01 NOTE — Progress Notes (Signed)
08/01/2013 [name] [id number] Aug 26, 1934   History of Present Illness:  This is a 79 year old female who is known to Dr. Deatra Ina for previous colonoscopy in April 2011 at which time she had some polyps removed, both tubular adenomas and hyperplastic polyps along with severe diverticulosis in the sigmoid colon.  It was recommended that she have a repeat colonoscopy in 5 years from that time. She presents to our office today at the recommendation of her PCP, Dr. Edrick Oh, for a couple of different complaints. First, she complains of postprandial nausea, which occurs almost immediately after eating for the past one month or so. She states that it occurs no matter what she eats. She denies any vomiting. She denies any complaints of heartburn, reflux, or indigestion. She has not started any new medications. She also reports that she has constipation; she says that her stools are very hard. Does not feel like she completely empties her bowels at times. She also complains of some intermittent seeping or leakage of liquid stool into her undergarments at times. She says that she has to wear a pad because of this. She had recent labs performed by her PCP, and we are trying to obtain those results; she says that they did not check her thyroid.  She denies ever having the leakage of stool in the past; it has only been occurring intermittently for the past month or so as well.  Denies rectal bleeding.  Denies weight loss.  She has never undergone EGD in the past.    Current Medications, Allergies, Past Medical History, Past Surgical History, Family History and Social History were reviewed in [employer] record.   Physical Exam: BP 94/64  Pulse 76  Ht 5\' 5"  (1.651 m)  Wt 140 lb 3.2 oz (63.594 kg)  BMI 23.33 kg/m2 General: Well developed white female in no acute distress Head: Normocephalic and atraumatic Eyes:  Sclerae anicteric, conjunctiva pink  Ears: Normal auditory acuity Lungs:  Clear throughout to auscultation Heart: Regular rate and rhythm Abdomen: Soft, non-distended.  Normal bowel sounds.  Non-tender. Rectal:  External hemorrhoids noted.  DRE revealed decreased sphincter tone.  No fecal impaction or masses noted; only small amount of soft stool in anal canal.  Stool was heme negative. Musculoskeletal: Symmetrical with no gross deformities  Extremities: No edema  Neurological: Alert oriented x 4, grossly non-focal Psychological:  Alert and cooperative. Normal mood and affect  Assessment and Recommendations: -Nausea:  Immediately after eating for one month.  Will check EGD for further evaluation.  I offered her a prescription for anti-emetics, but she declined.   -Constipation:  She has intermittent leakage of liquid stool contents at times as well.  ? If she is leaking liquid stool around hard stool contents.  Will start daily Miralax.  Will check TSH as well since she has hypothyroidism and has not had her thyroid labs checked recently.  She had other labs performed by PCP and we will try to obtain those results.

## 2013-08-01 NOTE — Patient Instructions (Addendum)
You have been scheduled for an endoscopy with propofol. Please follow written instructions given to you at your visit today. If you use inhalers (even only as needed), please bring them with you on the day of your procedure. Your physician has requested that you go to www.startemmi.com and enter the access code given to you at your visit today. This web site gives a general overview about your procedure. However, you should still follow specific instructions given to you by our office regarding your preparation for the procedure.  Please purchase Miralax over the counter and take as directed once daily.  Your physician has requested that you go to the basement for the following lab work before leaving today: TSH

## 2013-08-02 ENCOUNTER — Ambulatory Visit (INDEPENDENT_AMBULATORY_CARE_PROVIDER_SITE_OTHER): Payer: Medicare HMO | Admitting: Vascular Surgery

## 2013-08-02 ENCOUNTER — Encounter: Payer: Self-pay | Admitting: Vascular Surgery

## 2013-08-02 ENCOUNTER — Ambulatory Visit
Admission: RE | Admit: 2013-08-02 | Discharge: 2013-08-02 | Disposition: A | Discharge status: Home / Self Care | Payer: Commercial Managed Care - HMO | Source: Ambulatory Visit | Attending: Vascular Surgery | Admitting: Vascular Surgery

## 2013-08-02 VITALS — BP 141/78 | HR 96 | Resp 16 | Ht 62.0 in | Wt 136.0 lb

## 2013-08-02 DIAGNOSIS — I714 Abdominal aortic aneurysm, without rupture, unspecified: Secondary | ICD-10-CM

## 2013-08-02 DIAGNOSIS — Z48812 Encounter for surgical aftercare following surgery on the circulatory system: Secondary | ICD-10-CM

## 2013-08-02 NOTE — Progress Notes (Signed)
Subjective:     Patient ID: [name], female   DOB: May 05, 1934, 79 y.o.   MRN: [id number]  HPI this 79 year old female returns for continued followup regarding her endovascular stent graft which was inserted in October of 2014. She has chronic renal insufficiency with a creatinine in the 2 point one to 2.4 range the maturity of the time. Her most recent creatinine was 3.2. He denies any abdominal or back symptoms. Today she had a CT scan with no contrast. She states her health is been good at over the past 6 months.  Past Medical History  Diagnosis Date  . CAD (coronary artery disease)     50% proximal LAD stenosis, 50% marginal stenosis of the circumflex, RCA 95% stenosis, EF 40% stenosis with hypokinesis of the interior wall.  She had a Taxus stent placed in 04/16/2006  . Hypertension   . Abdominal aortic aneurysm   . Renal artery stenosis     S/P stenting of the right renal atrophic left renal  . Dyslipidemia   . Hypothyroidism   . Tobacco user     Previous  . Adenomatous polyp 2004, 2011  . Hyperplastic colon polyp 2011  . Hemorrhoids   . Diverticulosis   . Renal insufficiency   . Anemia   . Myocardial infarction     stent 2008    History  Substance Use Topics  . Smoking status: Former Smoker -- 1 years    Types: Cigarettes    Quit date: 03/17/2006  . Smokeless tobacco: Never Used  . Alcohol Use: No    Family History  Problem Relation Age of Onset  . Uterine cancer Mother   . Heart attack Father 61  . Heart disease Father     before age 107  . Peripheral vascular disease Sister   . Other Sister     Renal artery stenosis  . Heart disease Sister     before age 80  . Heart attack Brother 58  . Heart disease Brother     before age 69  . Lung cancer Brother     \  . Colon cancer Neg Hx   . Breast cancer Maternal Aunt     x2    Allergies  Allergen Reactions  . Sulfonamide Derivatives Hives    Current outpatient prescriptions:acetaminophen (TYLENOL) 325 MG  tablet, Take 325 mg by mouth daily as needed for pain., Disp: , Rfl: ;  aspirin EC 81 MG tablet, Take 81 mg by mouth daily., Disp: , Rfl: ;  citalopram (CELEXA) 20 MG tablet, Take 20 mg by mouth daily. , Disp: , Rfl: ;  furosemide (LASIX) 40 MG tablet, Take 1 tablet (40 mg total) by mouth daily., Disp: 90 tablet, Rfl: 1 hydrocortisone (ANUSOL-HC) 25 MG suppository, Place 25 mg rectally at bedtime as needed for hemorrhoids. , Disp: , Rfl: ;  levothyroxine (SYNTHROID, LEVOTHROID) 50 MCG tablet, Take 50 mcg by mouth daily. , Disp: , Rfl: ;  NIFEdipine (PROCARDIA XL/ADALAT-CC) 60 MG 24 hr tablet, Take 60 mg by mouth daily., Disp: , Rfl:  nitroGLYCERIN (NITROSTAT) 0.4 MG SL tablet, Place 0.4 mg under the tongue every 5 (five) minutes as needed for chest pain., Disp: , Rfl: ;  Omega-3 Fatty Acids (FISH OIL) 1000 MG CAPS, Take 1,000 mg by mouth daily., Disp: , Rfl: ;  oxyCODONE (OXY IR/ROXICODONE) 5 MG immediate release tablet, Take 1 tablet (5 mg total) by mouth every 6 (six) hours as needed for pain., Disp: 30 tablet, Rfl:  0 pravastatin (PRAVACHOL) 80 MG tablet, Take 80 mg by mouth at bedtime. , Disp: , Rfl:   BP 141/78  Pulse 96  Resp 16  Ht 5\' 2"  (1.575 m)  Wt 136 lb (61.689 kg)  BMI 24.87 kg/m2  Body mass index is 24.87 kg/(m^2).           Review of Systems denies chest pain, dyspnea on exertion, PND, orthopnea, claudication. All systems negative and a complete review of systems     Objective:   Physical Exam BP 141/78  Pulse 96  Resp 16  Ht 5\' 2"  (1.575 m)  Wt 136 lb (61.689 kg)  BMI 24.87 kg/m2  Gen.-alert and oriented x3 in no apparent distress HEENT normal for age Lungs no rhonchi or wheezing Cardiovascular regular rhythm no murmurs carotid pulses 3+ palpable no bruits audible Abdomen soft nontender no palpable masses Musculoskeletal free of  major deformities Skin clear -no rashes Neurologic normal Lower extremities 3+ femoral and dorsalis pedis pulses palpable  bilaterally with no edema  Today ordered a CT scan without contrast which I reviewed and interpreted by computer. Patient has had slight diminution of the size of the aneurysm sac from 5.0-4.7 cm. There's been no migration of the graft. Were unable to check for endo-leaks since no contrast was utilized.        Assessment:     7 months status post endovascular stent graft for abdominal aortic aneurysm in patient with chronic renal insufficiency and one functioning kidney-doing well with slight decrease in size of sac    Plan:     Return in 6 months for duplex scan of abdominal aorta and stent graft checked for in the leaks and size of sac

## 2013-08-02 NOTE — Progress Notes (Signed)
If constipation and incontinence do not improve with mitalax may consider colonoacopy (pt due for colo in 1 year)

## 2013-08-02 NOTE — Addendum Note (Signed)
Addended by: Mena Goes on: 08/02/2013 01:08 PM   Modules accepted: Orders

## 2013-08-04 ENCOUNTER — Encounter: Payer: Self-pay | Admitting: Gastroenterology

## 2013-08-04 ENCOUNTER — Ambulatory Visit (AMBULATORY_SURGERY_CENTER): Payer: Medicare HMO | Admitting: Gastroenterology

## 2013-08-04 VITALS — BP 171/85 | HR 60 | Temp 97.1°F | Resp 16 | Ht 65.0 in | Wt 140.0 lb

## 2013-08-04 DIAGNOSIS — K222 Esophageal obstruction: Secondary | ICD-10-CM

## 2013-08-04 DIAGNOSIS — R110 Nausea: Secondary | ICD-10-CM

## 2013-08-04 MED ORDER — SODIUM CHLORIDE 0.9 % IV SOLN: 500.0000 mL | INTRAVENOUS | Status: DC

## 2013-08-04 NOTE — Progress Notes (Signed)
Called to room to assist during endoscopic procedure.  Patient ID and intended procedure confirmed with present staff. Received instructions for my participation in the procedure from the performing physician.  

## 2013-08-04 NOTE — Progress Notes (Signed)
Report to pacu rn, vss, bbs=clear 

## 2013-08-04 NOTE — Op Note (Signed)
[facility] [place]  [address] [place], [address]   ENDOSCOPY PROCEDURE REPORT  PATIENT: Krista, [name]  MR#: [id number] BIRTHDATE: [birth year] , 42  yrs. old GENDER: Female ENDOSCOPIST: Inda Castle, MD REFERRED BY:  Dione Housekeeper, M.D. PROCEDURE DATE:  08/04/2013 PROCEDURE:  EGD, diagnostic and Maloney dilation of esophagus ASA CLASS:     Class II INDICATIONS:  Nausea. MEDICATIONS: MAC sedation, administered by CRNA, Propofol (Diprivan), and Simethicone 0.6cc PO TOPICAL ANESTHETIC: Cetacaine Spray  DESCRIPTION OF PROCEDURE: After the risks benefits and alternatives of the procedure were thoroughly explained, informed consent was obtained.  The LB [id number] endoscope was introduced through the mouth and advanced to the third portion of the duodenum. Without limitations.  The instrument was slowly withdrawn as the mucosa was fully examined.      There was a moderate stricture at the GE junction.  The 9 mm gastroscope easily traversed the stricture.   There was a moderate stricture at the GE junction.  The 9 mm gastroscope easily traversed the stricture.   The remainder of the upper endoscopy exam was otherwise normal.  Retroflexed views revealed no abnormalities.     The scope was then withdrawn from the patient and the procedure completed. A #52 Isabell Jarvis dilator was passed with moderate resistance.  There was no heme.  COMPLICATIONS: There were no complications. ENDOSCOPIC IMPRESSION:  esophageal stricture-status post Maloney dilation  Not certain whether stricture is responsible for patient's nausea.  RECOMMENDATIONS: if nausea has not subsided within the next few days will obtain a gastric emptying scan REPEAT EXAM:  eSigned:  Inda Castle, MD 08/04/2013 1:52 PM   CC:

## 2013-08-04 NOTE — Patient Instructions (Addendum)
Call office in 5 days to report progress  YOU HAD AN ENDOSCOPIC PROCEDURE TODAY AT [place]: Refer to the procedure report that was given to you for any specific questions about what was found during the examination.  If the procedure report does not answer your questions, please call your gastroenterologist to clarify.  If you requested that your care partner not be given the details of your procedure findings, then the procedure report has been included in a sealed envelope for you to review at your convenience later.  YOU SHOULD EXPECT: Some feelings of bloating in the abdomen. Passage of more gas than usual.  Walking can help get rid of the air that was put into your GI tract during the procedure and reduce the bloating. If you had a lower endoscopy (such as a colonoscopy or flexible sigmoidoscopy) you may notice spotting of blood in your stool or on the toilet paper.  DIET:  Please have nothing by mouth until 3PM.  Then you may have water.  If that is tolerated, you may proceed to a soft diet for the rest of the day. If you underwent a bowel prep for your procedure, then you may not have a normal bowel movement for a few days.  Drink plenty of fluids but you should avoid alcoholic beverages for 24 hours.  ACTIVITY: Your care partner should take you home directly after the procedure.  You should plan to take it easy, moving slowly for the rest of the day.  You can resume normal activity the day after the procedure however you should NOT DRIVE or use heavy machinery for 24 hours (because of the sedation medicines used during the test).    SYMPTOMS TO REPORT IMMEDIATELY: A gastroenterologist can be reached at any hour.  During normal business hours, 8:30 AM to 5:00 PM Monday through Friday, call [phone].  After hours and on weekends, please call the GI answering service at [phone] who will take a message and have the physician on call contact you.   Following upper  endoscopy (EGD)  Vomiting of blood or coffee ground material  New chest pain or pain under the shoulder blades  Painful or persistently difficult swallowing  New shortness of breath  Fever of 100F or higher  Black, tarry-looking stools  FOLLOW UP: If any biopsies were taken you will be contacted by phone or by letter within the next 1-3 weeks.  Call your gastroenterologist if you have not heard about the biopsies in 3 weeks.  Our staff will call the home number listed on your records the next business day following your procedure to check on you and address any questions or concerns that you may have at that time regarding the information given to you following your procedure. This is a courtesy call and so if there is no answer at the home number and we have not heard from you through the emergency physician on call, we will assume that you have returned to your regular daily activities without incident.  SIGNATURES/CONFIDENTIALITY: You and/or your care partner have signed paperwork which will be entered into your electronic medical record.  These signatures attest to the fact that that the information above on your After Visit Summary has been reviewed and is understood.  Full responsibility of the confidentiality of this discharge information lies with you and/or your care-partner.

## 2013-08-05 ENCOUNTER — Telehealth: Payer: Self-pay | Admitting: *Deleted

## 2013-08-05 NOTE — Telephone Encounter (Signed)
  Follow up Call-  Call back number 08/04/2013  Post procedure Call Back phone  # [phone]  Permission to leave phone message Yes     Patient questions:  Do you have a fever, pain , or abdominal swelling? no Pain Score  0 *  Have you tolerated food without any problems? yes  Have you been able to return to your normal activities? yes  Do you have any questions about your discharge instructions: Diet   no Medications  no Follow up visit  no  Do you have questions or concerns about your Care? no  Actions: * If pain score is 4 or above: No action needed, pain <4.

## 2013-08-22 ENCOUNTER — Other Ambulatory Visit: Payer: Self-pay

## 2013-08-22 MED ORDER — NIFEDIPINE ER OSMOTIC RELEASE 60 MG PO TB24: 60.0000 mg | ORAL_TABLET | Freq: Every day | ORAL | Status: DC

## 2013-09-07 ENCOUNTER — Ambulatory Visit (INDEPENDENT_AMBULATORY_CARE_PROVIDER_SITE_OTHER): Payer: Commercial Managed Care - HMO | Admitting: Cardiology

## 2013-09-07 ENCOUNTER — Encounter: Payer: Self-pay | Admitting: Cardiology

## 2013-09-07 VITALS — BP 129/85 | HR 91 | Ht 65.0 in | Wt 139.0 lb

## 2013-09-07 DIAGNOSIS — I2510 Atherosclerotic heart disease of native coronary artery without angina pectoris: Secondary | ICD-10-CM

## 2013-09-07 NOTE — Progress Notes (Signed)
HPI The patient presents for follow up of vascular disease.  Since I last saw her she has had her abdominal aortic aneurysm repaired.  She has otherwise had stable renal function. The patient denies any new symptoms such as chest discomfort, neck or arm discomfort. There has been no new shortness of breath, PND or orthopnea. There have been no reported palpitations, presyncope or syncope.  She goes to the [facility].  Allergies  Allergen Reactions  . Sulfonamide Derivatives Hives    Current Outpatient Prescriptions  Medication Sig Dispense Refill  . acetaminophen (TYLENOL) 325 MG tablet Take 325 mg by mouth daily as needed for pain.      Marland Kitchen aspirin EC 81 MG tablet Take 81 mg by mouth daily.      . citalopram (CELEXA) 20 MG tablet Take 20 mg by mouth daily.       . furosemide (LASIX) 40 MG tablet Take 1 tablet (40 mg total) by mouth daily.  90 tablet  1  . hydrocortisone (ANUSOL-HC) 25 MG suppository Place 25 mg rectally at bedtime as needed for hemorrhoids.       Marland Kitchen levothyroxine (SYNTHROID, LEVOTHROID) 50 MCG tablet Take 50 mcg by mouth daily.       Marland Kitchen NIFEdipine (PROCARDIA XL/ADALAT-CC) 60 MG 24 hr tablet Take 1 tablet (60 mg total) by mouth daily.  30 tablet  1  . nitroGLYCERIN (NITROSTAT) 0.4 MG SL tablet Place 0.4 mg under the tongue every 5 (five) minutes as needed for chest pain.      . Omega-3 Fatty Acids (FISH OIL) 1000 MG CAPS Take 1,000 mg by mouth daily.      Marland Kitchen omeprazole (PRILOSEC) 20 MG capsule       . pravastatin (PRAVACHOL) 80 MG tablet Take 80 mg by mouth at bedtime.       Marland Kitchen oxyCODONE (OXY IR/ROXICODONE) 5 MG immediate release tablet Take 1 tablet (5 mg total) by mouth every 6 (six) hours as needed for pain.  30 tablet  0   No current facility-administered medications for this visit.    Past Medical History  Diagnosis Date  . CAD (coronary artery disease)     50% proximal LAD stenosis, 50% marginal stenosis of the circumflex, RCA 95% stenosis, EF 40% stenosis with  hypokinesis of the interior wall.  She had a Taxus stent placed in 04/16/2006  . Hypertension   . Abdominal aortic aneurysm   . Renal artery stenosis     S/P stenting of the right renal atrophic left renal  . Dyslipidemia   . Hypothyroidism   . Tobacco user     Previous  . Adenomatous polyp 2004, 2011  . Hyperplastic colon polyp 2011  . Hemorrhoids   . Diverticulosis   . Renal insufficiency   . Anemia   . Myocardial infarction     stent 2008    Past Surgical History  Procedure Laterality Date  . Abdominal hysterectomy    . Carpal tunnel release Bilateral   . Cholecystectomy    . Flexible sigmoidoscopy  02/25/2012    Procedure: FLEXIBLE SIGMOIDOSCOPY;  Surgeon: Inda Castle, MD;  Location: [facility] ENDOSCOPY;  Service: Endoscopy;  Laterality: N/A;  . Hemorrhoid banding  02/25/2012    Procedure: HEMORRHOID BANDING;  Surgeon: Inda Castle, MD;  Location: [facility] ENDOSCOPY;  Service: Endoscopy;  Laterality: N/A;  . Abdominal angiogram  12/21/2012    AAA  . Abdominal aortic endovascular stent graft N/A 12/22/2012    Procedure: ABDOMINAL AORTIC ENDOVASCULAR  STENT GRAFT- GORE;  Surgeon: Mal Misty, MD;  Location: [facility] OR;  Service: Vascular;  Laterality: N/A;  Ultrasound guided    ROS:  As stated in the HPI and negative for all other systems.  PHYSICAL EXAM BP 129/85  Pulse 91  Ht 5\' 5"  (1.651 m)  Wt 139 lb (63.05 kg)  BMI 23.13 kg/m2 GENERAL:  Well appearing NECK:  No jugular venous distention, waveform within normal limits, carotid upstroke brisk and symmetric, no bruits, no thyromegaly LUNGS:  Clear to auscultation bilaterally CHEST:  Unremarkable HEART:  PMI not displaced or sustained,S1 and S2 within normal limits, no S3, no S4, no clicks, no rubs, no murmurs ABD:  Flat, positive bowel sounds normal in frequency in pitch, no bruits, no rebound, no guarding, no midline pulsatile mass, no hepatomegaly, no splenomegaly EXT:  2 plus pulses throughout, no edema, no cyanosis no  clubbing  EKG:  Sinus rhythm, rate 73, axis within normal limits, intervals within normal limits, no acute ST-T wave changes.  09/07/2013  ASSESSMENT AND PLAN  CAD:  The patient has no new sypmtomssince stress perfusion testing last year. No change in therapy is indicated.  AAA:  She is status post stent graft in October and is followed by [facility].    HTN:   Her blood pressure is elevated today but it turns out she was out of her nifedipine. We will renew this.  CKD:  This is followed by Sherrie Mustache, MD and Dr. Florene Glen.  Creat is 3.26  DYSLIPIDEMIA:  LDL was 98.  I will defer to Sherrie Mustache, MD

## 2013-09-07 NOTE — Patient Instructions (Signed)
The current medical regimen is effective;  continue present plan and medications.  Follow up in 1 year with Dr. Hochrein in the [place] office.  You will receive a letter in the mail 2 months before you are due.  Please call us when you receive this letter to schedule your follow up appointment.  

## 2013-10-18 ENCOUNTER — Other Ambulatory Visit: Payer: Self-pay | Admitting: *Deleted

## 2013-10-18 MED ORDER — NIFEDIPINE ER OSMOTIC RELEASE 60 MG PO TB24
60.0000 mg | ORAL_TABLET | Freq: Every day | ORAL | Status: DC
Start: 2013-10-18 — End: 2014-04-11

## 2014-02-06 ENCOUNTER — Encounter: Payer: Self-pay | Admitting: Vascular Surgery

## 2014-02-07 ENCOUNTER — Ambulatory Visit (HOSPITAL_COMMUNITY)
Admission: RE | Admit: 2014-02-07 | Discharge: 2014-02-07 | Disposition: A | Discharge status: Home / Self Care | Payer: Medicare HMO | Source: Ambulatory Visit | Attending: Vascular Surgery | Admitting: Vascular Surgery

## 2014-02-07 ENCOUNTER — Ambulatory Visit (INDEPENDENT_AMBULATORY_CARE_PROVIDER_SITE_OTHER): Payer: Medicare HMO | Admitting: Vascular Surgery

## 2014-02-07 ENCOUNTER — Encounter: Payer: Self-pay | Admitting: Vascular Surgery

## 2014-02-07 VITALS — BP 133/92 | HR 72 | Ht 65.0 in | Wt 140.5 lb

## 2014-02-07 DIAGNOSIS — I714 Abdominal aortic aneurysm, without rupture, unspecified: Secondary | ICD-10-CM

## 2014-02-07 DIAGNOSIS — Z48812 Encounter for surgical aftercare following surgery on the circulatory system: Secondary | ICD-10-CM

## 2014-02-07 NOTE — Progress Notes (Signed)
Subjective:     Patient ID: [name], female   DOB: Nov 18, 1934, 79 y.o.   MRN: [id number]  HPI this 78 year old female returns for continued follow-up regarding her aortic stent graft insertion which was performed in October 2014. She had a complex procedure. 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 She has done well postoperatively with no abdominal or back pain. She does have dyspnea on exertion and some leg discomfort which limits her to walking about one half block area  Past Medical History  Diagnosis Date  . CAD (coronary artery disease)     50% proximal LAD stenosis, 50% marginal stenosis of the circumflex, RCA 95% stenosis, EF 40% stenosis with hypokinesis of the interior wall.  She had a Taxus stent placed in 04/16/2006  . Hypertension   . Abdominal aortic aneurysm   . Renal artery stenosis     S/P stenting of the right renal atrophic left renal  . Dyslipidemia   . Hypothyroidism   . Tobacco user     Previous  . Adenomatous polyp 2004, 2011  . Hyperplastic colon polyp 2011  . Hemorrhoids   . Diverticulosis   . Renal insufficiency   . Anemia   . Myocardial infarction     stent 2008    History  Substance Use Topics  . Smoking status: Former Smoker -- 1 years    Types: Cigarettes    Quit date: 03/17/2006  . Smokeless tobacco: Never Used  . Alcohol Use: No    Family History  Problem Relation Age of Onset  . Uterine cancer Mother   . Heart attack Father 57  . Heart disease Father     before age 76  . Peripheral vascular disease Sister   . Other Sister     Renal artery stenosis  . Heart disease Sister     before age 72  . Heart attack Brother 50  . Heart disease Brother     before age 64  . Lung cancer Brother     \  . Colon cancer Neg Hx   . Breast cancer Maternal Aunt     x2    Allergies  Allergen Reactions  . Sulfonamide Derivatives Hives    Current outpatient prescriptions: acetaminophen (TYLENOL) 325 MG tablet, Take 325 mg by mouth  daily as needed for pain., Disp: , Rfl: ;  aspirin EC 81 MG tablet, Take 81 mg by mouth daily., Disp: , Rfl: ;  citalopram (CELEXA) 20 MG tablet, Take 20 mg by mouth daily. , Disp: , Rfl: ;  furosemide (LASIX) 40 MG tablet, Take 1 tablet (40 mg total) by mouth daily., Disp: 90 tablet, Rfl: 1 hydrocortisone (ANUSOL-HC) 25 MG suppository, Place 25 mg rectally at bedtime as needed for hemorrhoids. , Disp: , Rfl: ;  levothyroxine (SYNTHROID, LEVOTHROID) 50 MCG tablet, Take 50 mcg by mouth daily. , Disp: , Rfl: ;  NIFEdipine (PROCARDIA XL/ADALAT-CC) 60 MG 24 hr tablet, Take 1 tablet (60 mg total) by mouth daily., Disp: 30 tablet, Rfl: 5 nitroGLYCERIN (NITROSTAT) 0.4 MG SL tablet, Place 0.4 mg under the tongue every 5 (five) minutes as needed for chest pain., Disp: , Rfl: ;  Omega-3 Fatty Acids (FISH OIL) 1000 MG CAPS, Take 1,000 mg by mouth daily., Disp: , Rfl: ;  omeprazole (PRILOSEC) 20 MG capsule, , Disp: , Rfl: ;  oxyCODONE (OXY IR/ROXICODONE) 5 MG immediate release tablet, Take 1 tablet (5 mg total) by mouth every 6 (six) hours as needed  for pain., Disp: 30 tablet, Rfl: 0 pravastatin (PRAVACHOL) 80 MG tablet, Take 80 mg by mouth at bedtime. , Disp: , Rfl:   BP 133/92 mmHg  Pulse 72  Ht 5\' 5"  (1.651 m)  Wt 140 lb 8 oz (63.73 kg)  BMI 23.38 kg/m2  SpO2 99%  Body mass index is 23.38 kg/(m^2).           Review of Systems denies chest pain but does have dyspnea on exertion and leg pain with exertion. Other systems negative and a complete review of systems other than chronic renal insufficiency followed by Dr. Florene Glen     Objective:   Physical Exam BP 133/92 mmHg  Pulse 72  Ht 5\' 5"  (1.651 m)  Wt 140 lb 8 oz (63.73 kg)  BMI 23.38 kg/m2  SpO2 99%  Gen.-alert and oriented x3 in no apparent distress HEENT normal for age Lungs no rhonchi or wheezing Cardiovascular regular rhythm no murmurs carotid pulses 3+ palpable no bruits audible Abdomen soft nontender no palpable  masses Musculoskeletal free of  major deformities Skin clear -no rashes Neurologic normal Lower extremities 3+ femoral and posterior tibial pulses palpable bilaterally with no edema  Today I ordered a duplex scan of her abdominal aortic stent graft which reveals continued decrease in size of the sac to 4.15 x 4.18 cm maximally No evidence of leak noted       Assessment:     Doing well 1 year post aortic stent graft with no evidence of endoleak and continued decrease in sac size to 4.18 cm in maximum diameter Chronic renal insufficiency with occlusion left renal artery-chronic    Plan:     Return in one year with CT scan of abdomen with no contrast

## 2014-02-08 NOTE — Addendum Note (Signed)
Addended by: Dorthula Rue L on: 02/08/2014 01:17 PM   Modules accepted: Orders

## 2014-02-13 ENCOUNTER — Other Ambulatory Visit: Payer: Self-pay

## 2014-02-13 DIAGNOSIS — Z48812 Encounter for surgical aftercare following surgery on the circulatory system: Secondary | ICD-10-CM

## 2014-02-13 DIAGNOSIS — I714 Abdominal aortic aneurysm, without rupture, unspecified: Secondary | ICD-10-CM

## 2014-02-23 ENCOUNTER — Encounter (HOSPITAL_COMMUNITY): Payer: Self-pay | Admitting: Surgery

## 2014-04-11 ENCOUNTER — Other Ambulatory Visit: Payer: Self-pay | Admitting: Cardiology

## 2014-05-31 ENCOUNTER — Encounter: Payer: Self-pay | Admitting: Gastroenterology

## 2014-06-12 ENCOUNTER — Telehealth: Payer: Self-pay | Admitting: Gastroenterology

## 2014-06-12 NOTE — Telephone Encounter (Signed)
ok 

## 2014-06-14 ENCOUNTER — Encounter: Payer: Self-pay | Admitting: Gastroenterology

## 2014-06-14 NOTE — Telephone Encounter (Signed)
Left message on machine to call back  

## 2014-06-14 NOTE — Telephone Encounter (Signed)
[name],  This patient is due her colonoscopy. The recall is for 06/2014. No issues that I am aware of. Would she need to have an OV first?

## 2014-06-14 NOTE — Telephone Encounter (Signed)
Yes, that is ok with me. Please schedule her for next available ROV with me if she has any issues she needs to discuss.  Thanks

## 2014-06-14 NOTE — Telephone Encounter (Signed)
Pt has been scheduled for pre visit and colon  

## 2014-06-26 ENCOUNTER — Ambulatory Visit (AMBULATORY_SURGERY_CENTER): Payer: Self-pay | Admitting: *Deleted

## 2014-06-26 VITALS — Ht 65.0 in | Wt 141.4 lb

## 2014-06-26 DIAGNOSIS — Z86010 Personal history of colonic polyps: Secondary | ICD-10-CM

## 2014-06-26 MED ORDER — MOVIPREP 100 G PO SOLR: 1.0000 | Freq: Once | ORAL | Status: DC

## 2014-06-26 NOTE — Progress Notes (Signed)
No egg or soy allergy No home 02 No diet pills No issues with past sedation No e mail for emmi  pt states she has issues with severe constipation, after she eats if she doesnt force herself to have a very hard stool she will vomit .Marland Kitchen  This is daily and has been going on for 2 months.  Has take metamucil, colace and no help.  Will do a 2 day prep as per protocol as per okay Dr Ardis Hughs

## 2014-07-05 ENCOUNTER — Encounter: Payer: Self-pay | Admitting: Gastroenterology

## 2014-07-11 ENCOUNTER — Encounter: Payer: Self-pay | Admitting: Gastroenterology

## 2014-07-11 ENCOUNTER — Ambulatory Visit (AMBULATORY_SURGERY_CENTER): Payer: Medicare HMO | Admitting: Gastroenterology

## 2014-07-11 VITALS — BP 162/86 | HR 57 | Temp 96.5°F | Resp 20 | Ht 65.0 in | Wt 141.0 lb

## 2014-07-11 DIAGNOSIS — D124 Benign neoplasm of descending colon: Secondary | ICD-10-CM

## 2014-07-11 DIAGNOSIS — D123 Benign neoplasm of transverse colon: Secondary | ICD-10-CM | POA: Diagnosis not present

## 2014-07-11 DIAGNOSIS — Z86010 Personal history of colonic polyps: Secondary | ICD-10-CM

## 2014-07-11 MED ORDER — SODIUM CHLORIDE 0.9 % IV SOLN: 500.0000 mL | INTRAVENOUS | Status: DC

## 2014-07-11 NOTE — Op Note (Signed)
[place]  [address]. [place], [address]   COLONOSCOPY PROCEDURE REPORT  PATIENT: [name], [name]  MR#: [id number] BIRTHDATE: 11/24/1934 , 55  yrs. old GENDER: female ENDOSCOPIST: Milus Banister, MD PROCEDURE DATE:  07/11/2014 PROCEDURE:   Colonoscopy with snare polypectomy First Screening Colonoscopy - Avg.  risk and is 50 yrs.  old or older - No.  Prior Negative Screening - Now for repeat screening. N/A  History of Adenoma - Now for follow-up colonoscopy & has been > or = to 3 yrs.  Yes hx of adenoma.  Has been 3 or more years since last colonoscopy. ASA CLASS:   Class II INDICATIONS:Surveillance due to prior colonic neoplasia and Colonoscopy 2011 Dr.  Deatra Ina found small TAs. MEDICATIONS: Monitored anesthesia care and Propofol 150 mg IV  DESCRIPTION OF PROCEDURE:   After the risks benefits and alternatives of the procedure were thoroughly explained, informed consent was obtained.  The digital rectal exam revealed no abnormalities of the rectum.   The LB [id number]  endoscope was introduced through the anus and advanced to the cecum, which was identified by both the appendix and ileocecal valve. No adverse events experienced.   The quality of the prep was good.  The instrument was then slowly withdrawn as the colon was fully examined.   COLON FINDINGS: Two sessile polyps ranging between 3-59mm in size were found in the descending colon and transverse colon. Polypectomies were performed with a cold snare.  The resection was complete, the polyp tissue was completely retrieved and sent to histology.   Small internal hemorrhoids were found.   The examination was otherwise normal.  Retroflexed views revealed no abnormalities. The time to cecum = 3.0 Withdrawal time = 7.6   The scope was withdrawn and the procedure completed. COMPLICATIONS: There were no immediate complications.  ENDOSCOPIC IMPRESSION: 1. Two sessile polyps ranging between 3-63mm in size  were found in the descending colon and transverse colon; polypectomies were performed with a cold snare 2.   Small internal hemorrhoids 3.   The examination was otherwise normal  RECOMMENDATIONS: Given your age, you may not need another colonoscopy for colon cancer screening or polyp surveillance.  These types of tests usually stop around the age 30. Await final pathology results.  eSigned:  Milus Banister, MD 07/11/2014 2:40 PM   cc: Dione Housekeeper, MD

## 2014-07-11 NOTE — Progress Notes (Signed)
Called to room to assist during endoscopic procedure.  Patient ID and intended procedure confirmed with present staff. Received instructions for my participation in the procedure from the performing physician.  

## 2014-07-11 NOTE — Progress Notes (Signed)
Patient awakening,vss,report to rn 

## 2014-07-11 NOTE — Patient Instructions (Signed)
YOU HAD AN ENDOSCOPIC PROCEDURE TODAY AT THE [facility]:   Refer to the procedure report that was given to you for any specific questions about what was found during the examination.  If the procedure report does not answer your questions, please call your gastroenterologist to clarify.  If you requested that your care partner not be given the details of your procedure findings, then the procedure report has been included in a sealed envelope for you to review at your convenience later.  YOU SHOULD EXPECT: Some feelings of bloating in the abdomen. Passage of more gas than usual.  Walking can help get rid of the air that was put into your GI tract during the procedure and reduce the bloating. If you had a lower endoscopy (such as a colonoscopy or flexible sigmoidoscopy) you may notice spotting of blood in your stool or on the toilet paper. If you underwent a bowel prep for your procedure, you may not have a normal bowel movement for a few days.  Please Note:  You might notice some irritation and congestion in your nose or some drainage.  This is from the oxygen used during your procedure.  There is no need for concern and it should clear up in a day or so.  SYMPTOMS TO REPORT IMMEDIATELY:   Following lower endoscopy (colonoscopy or flexible sigmoidoscopy):  Excessive amounts of blood in the stool  Significant tenderness or worsening of abdominal pains  Swelling of the abdomen that is new, acute  Fever of 100F or higher   For urgent or emergent issues, a gastroenterologist can be reached at any hour by calling [phone].   DIET: Your first meal following the procedure should be a small meal and then it is ok to progress to your normal diet. Heavy or fried foods are harder to digest and may make you feel nauseous or bloated.  Likewise, meals heavy in dairy and vegetables can increase bloating.  Drink plenty of fluids but you should avoid alcoholic beverages for 24  hours.  ACTIVITY:  You should plan to take it easy for the rest of today and you should NOT DRIVE or use heavy machinery until tomorrow (because of the sedation medicines used during the test).    FOLLOW UP: Our staff will call the number listed on your records the next business day following your procedure to check on you and address any questions or concerns that you may have regarding the information given to you following your procedure. If we do not reach you, we will leave a message.  However, if you are feeling well and you are not experiencing any problems, there is no need to return our call.  We will assume that you have returned to your regular daily activities without incident.  If any biopsies were taken you will be contacted by phone or by letter within the next 1-3 weeks.  Please call us at [phone] if you have not heard about the biopsies in 3 weeks.    SIGNATURES/CONFIDENTIALITY: You and/or your care partner have signed paperwork which will be entered into your electronic medical record.  These signatures attest to the fact that that the information above on your After Visit Summary has been reviewed and is understood.  Full responsibility of the confidentiality of this discharge information lies with you and/or your care-partner.   Resume medications. Information given on polyps,hemorrhoids and high fiber diet. 

## 2014-07-12 ENCOUNTER — Telehealth: Payer: Self-pay | Admitting: *Deleted

## 2014-07-12 NOTE — Telephone Encounter (Signed)
  Follow up Call-  Call back number 07/11/2014 08/04/2013  Post procedure Call Back phone  # [phone]  Permission to leave phone message Yes Yes     Patient questions:  Do you have a fever, pain , or abdominal swelling? No. Pain Score  0 *  Have you tolerated food without any problems? Yes.    Have you been able to return to your normal activities? Yes.    Do you have any questions about your discharge instructions: Diet   No. Medications  No. Follow up visit  No.  Do you have questions or concerns about your Care? No.  Actions: * If pain score is 4 or above: No action needed, pain <4.

## 2014-07-25 ENCOUNTER — Encounter: Payer: Self-pay | Admitting: Gastroenterology

## 2014-07-28 ENCOUNTER — Other Ambulatory Visit: Payer: Self-pay | Admitting: Cardiology

## 2014-07-28 NOTE — Telephone Encounter (Signed)
Rx refill sent to patient pharmacy   

## 2014-07-30 ENCOUNTER — Other Ambulatory Visit: Payer: Self-pay | Admitting: Cardiology

## 2014-07-31 ENCOUNTER — Telehealth: Payer: Self-pay | Admitting: Gastroenterology

## 2014-07-31 NOTE — Telephone Encounter (Signed)
Pt was advised of letter and all questions were answered

## 2014-08-14 ENCOUNTER — Emergency Department (HOSPITAL_COMMUNITY)
Admission: EM | Admit: 2014-08-14 | Discharge: 2014-08-14 | Disposition: A | Discharge status: Home / Self Care | Payer: Medicare HMO | Attending: Emergency Medicine | Admitting: Emergency Medicine

## 2014-08-14 ENCOUNTER — Encounter (HOSPITAL_COMMUNITY): Payer: Self-pay | Admitting: Emergency Medicine

## 2014-08-14 ENCOUNTER — Emergency Department (HOSPITAL_COMMUNITY): Payer: Medicare HMO

## 2014-08-14 DIAGNOSIS — Z7952 Long term (current) use of systemic steroids: Secondary | ICD-10-CM | POA: Diagnosis not present

## 2014-08-14 DIAGNOSIS — I252 Old myocardial infarction: Secondary | ICD-10-CM | POA: Diagnosis not present

## 2014-08-14 DIAGNOSIS — Z87891 Personal history of nicotine dependence: Secondary | ICD-10-CM | POA: Insufficient documentation

## 2014-08-14 DIAGNOSIS — Z7951 Long term (current) use of inhaled steroids: Secondary | ICD-10-CM | POA: Diagnosis not present

## 2014-08-14 DIAGNOSIS — Z79899 Other long term (current) drug therapy: Secondary | ICD-10-CM | POA: Insufficient documentation

## 2014-08-14 DIAGNOSIS — I10 Essential (primary) hypertension: Secondary | ICD-10-CM | POA: Diagnosis not present

## 2014-08-14 DIAGNOSIS — Z862 Personal history of diseases of the blood and blood-forming organs and certain disorders involving the immune mechanism: Secondary | ICD-10-CM | POA: Diagnosis not present

## 2014-08-14 DIAGNOSIS — Z8719 Personal history of other diseases of the digestive system: Secondary | ICD-10-CM | POA: Diagnosis not present

## 2014-08-14 DIAGNOSIS — Z87448 Personal history of other diseases of urinary system: Secondary | ICD-10-CM | POA: Diagnosis not present

## 2014-08-14 DIAGNOSIS — E039 Hypothyroidism, unspecified: Secondary | ICD-10-CM | POA: Insufficient documentation

## 2014-08-14 DIAGNOSIS — I2510 Atherosclerotic heart disease of native coronary artery without angina pectoris: Secondary | ICD-10-CM | POA: Insufficient documentation

## 2014-08-14 DIAGNOSIS — R55 Syncope and collapse: Secondary | ICD-10-CM | POA: Insufficient documentation

## 2014-08-14 DIAGNOSIS — Z7982 Long term (current) use of aspirin: Secondary | ICD-10-CM | POA: Diagnosis not present

## 2014-08-14 DIAGNOSIS — R42 Dizziness and giddiness: Secondary | ICD-10-CM

## 2014-08-14 DIAGNOSIS — E785 Hyperlipidemia, unspecified: Secondary | ICD-10-CM | POA: Diagnosis not present

## 2014-08-14 DIAGNOSIS — R531 Weakness: Secondary | ICD-10-CM | POA: Diagnosis present

## 2014-08-14 LAB — COMPREHENSIVE METABOLIC PANEL
ALT: 10 U/L — ABNORMAL LOW (ref 14–54)
AST: 18 U/L (ref 15–41)
Albumin: 3.9 g/dL (ref 3.5–5.0)
Alkaline Phosphatase: 117 U/L (ref 38–126)
Anion gap: 9 (ref 5–15)
BUN: 28 mg/dL — ABNORMAL HIGH (ref 6–20)
CO2: 22 mmol/L (ref 22–32)
Calcium: 8.1 mg/dL — ABNORMAL LOW (ref 8.9–10.3)
Chloride: 110 mmol/L (ref 101–111)
Creatinine, Ser: 3.56 mg/dL — ABNORMAL HIGH (ref 0.44–1.00)
GFR calc non Af Amer: 11 mL/min — ABNORMAL LOW (ref >60)
GFR, EST AFRICAN AMERICAN: 13 mL/min — AB (ref >60)
Glucose, Bld: 111 mg/dL — ABNORMAL HIGH (ref 65–99)
Potassium: 3.7 mmol/L (ref 3.5–5.1)
Sodium: 141 mmol/L (ref 135–145)
Total Bilirubin: 0.4 mg/dL (ref 0.3–1.2)
Total Protein: 7.1 g/dL (ref 6.5–8.1)

## 2014-08-14 LAB — CBC WITH DIFFERENTIAL/PLATELET
Basophils Absolute: 0.1 10*3/uL (ref 0.0–0.1)
Basophils Relative: 1 % (ref 0–1)
EOS ABS: 0.1 10*3/uL (ref 0.0–0.7)
Eosinophils Relative: 1 % (ref 0–5)
HEMATOCRIT: 30.6 % — AB (ref 36.0–46.0)
Hemoglobin: 10 g/dL — ABNORMAL LOW (ref 12.0–15.0)
LYMPHS ABS: 1 10*3/uL (ref 0.7–4.0)
Lymphocytes Relative: 10 % — ABNORMAL LOW (ref 12–46)
MCH: 30.6 pg (ref 26.0–34.0)
MCHC: 32.7 g/dL (ref 30.0–36.0)
MCV: 93.6 fL (ref 78.0–100.0)
MONO ABS: 0.5 10*3/uL (ref 0.1–1.0)
MONOS PCT: 5 % (ref 3–12)
NEUTROS PCT: 83 % — AB (ref 43–77)
Neutro Abs: 8.1 10*3/uL — ABNORMAL HIGH (ref 1.7–7.7)
PLATELETS: 237 10*3/uL (ref 150–400)
RBC: 3.27 MIL/uL — ABNORMAL LOW (ref 3.87–5.11)
RDW: 13.4 % (ref 11.5–15.5)
WBC: 9.8 10*3/uL (ref 4.0–10.5)

## 2014-08-14 LAB — I-STAT TROPONIN, ED: TROPONIN I, POC: 0 ng/mL (ref 0.00–0.08)

## 2014-08-14 MED ORDER — MECLIZINE HCL 25 MG PO TABS
25.0000 mg | ORAL_TABLET | Freq: Once | ORAL | Status: AC
  Administered 2014-08-14: 25 mg via ORAL
  Filled 2014-08-14: qty 1

## 2014-08-14 MED ORDER — MECLIZINE HCL 25 MG PO TABS: 25.0000 mg | ORAL_TABLET | Freq: Three times a day (TID) | ORAL | Status: AC | PRN

## 2014-08-14 MED ORDER — SODIUM CHLORIDE 0.9 % IV BOLUS (SEPSIS)
500.0000 mL | Freq: Once | INTRAVENOUS | Status: AC
  Administered 2014-08-14: 500 mL via INTRAVENOUS

## 2014-08-14 MED ORDER — ONDANSETRON HCL 4 MG/2ML IJ SOLN
4.0000 mg | Freq: Once | INTRAMUSCULAR | Status: AC
  Administered 2014-08-14: 4 mg via INTRAVENOUS
  Filled 2014-08-14: qty 2

## 2014-08-14 NOTE — ED Provider Notes (Signed)
CSN: [id number]     Arrival date & time 08/14/14  1318 History   First MD Initiated Contact with Patient 08/14/14 1336     Chief Complaint  Patient presents with  . Emesis  . Weakness     (Consider location/radiation/quality/duration/timing/severity/associated sxs/prior Treatment) HPI Comments: Patient is a 80 year old female with history of coronary artery disease status post stenting. She presents today for evaluation of sudden onset of nausea, vomiting that started suddenly while she was grocery shopping. While extremely weak and dizzy. She was brought here by family for evaluation of this. She denies headache, fevers, or chills.  Patient is a 80 y.o. female presenting with vomiting. The history is provided by the patient.  Emesis Severity:  Severe Duration:  2 hours Timing:  Constant Progression:  Worsening Chronicity:  New Relieved by:  Nothing Worsened by:  Nothing tried Ineffective treatments:  None tried Associated symptoms: no abdominal pain and no diarrhea     Past Medical History  Diagnosis Date  . CAD (coronary artery disease)     50% proximal LAD stenosis, 50% marginal stenosis of the circumflex, RCA 95% stenosis, EF 40% stenosis with hypokinesis of the interior wall.  She had a Taxus stent placed in 04/16/2006  . Hypertension   . Abdominal aortic aneurysm   . Renal artery stenosis     S/P stenting of the right renal atrophic left renal  . Dyslipidemia   . Hypothyroidism   . Tobacco user     Previous  . Adenomatous polyp 2004, 2011  . Hyperplastic colon polyp 2011  . Hemorrhoids   . Diverticulosis   . Renal insufficiency   . Anemia   . Myocardial infarction     stent 2008  . Constipation   . Cataract    Past Surgical History  Procedure Laterality Date  . Abdominal hysterectomy    . Carpal tunnel release Bilateral   . Cholecystectomy    . Flexible sigmoidoscopy  02/25/2012    Procedure: FLEXIBLE SIGMOIDOSCOPY;  Surgeon: Inda Castle, MD;  Location:  [facility];  Service: Endoscopy;  Laterality: N/A;  . Hemorrhoid banding  02/25/2012    Procedure: HEMORRHOID BANDING;  Surgeon: Inda Castle, MD;  Location: [facility];  Service: Endoscopy;  Laterality: N/A;  . Abdominal angiogram  12/21/2012    AAA  . Abdominal aortic endovascular stent graft N/A 12/22/2012    Procedure: ABDOMINAL AORTIC ENDOVASCULAR STENT GRAFT- GORE;  Surgeon: Mal Misty, MD;  Location: [facility];  Service: Vascular;  Laterality: N/A;  Ultrasound guided  . Abdominal angiogram N/A 11/30/2012    Procedure: ABDOMINAL ANGIOGRAM;  Surgeon: Serafina Mitchell, MD;  [facility] C[facility];  Service: Cardiovascular;  Laterality: N/A;  . Cataract extraction, bilateral    . Colonoscopy    . Polypectomy     Family History  Problem Relation Age of Onset  . Uterine cancer Mother   . Heart attack Father 34  . Heart disease Father     before age 72  . Peripheral vascular disease Sister   . Other Sister     Renal artery stenosis  . Heart disease Sister     before age 23  . Heart attack Brother 83  . Heart disease Brother     before age 3  . Lung cancer Brother     \  . Colon cancer Neg Hx   . Breast cancer Maternal Aunt     x2   History  Substance Use Topics  .  Smoking status: Former Smoker -- 1 years    Types: Cigarettes    Quit date: 03/17/2006  . Smokeless tobacco: Never Used  . Alcohol Use: No   OB History    No data available     Review of Systems  Gastrointestinal: Positive for vomiting. Negative for abdominal pain and diarrhea.  All other systems reviewed and are negative.     Allergies  Codeine and Sulfonamide derivatives  Home Medications   Prior to Admission medications   Medication Sig Start Date End Date Taking? Authorizing Provider  acetaminophen (TYLENOL) 325 MG tablet Take 162.5-325 mg by mouth every 6 (six) hours as needed (For pain.).    Yes Historical Provider, MD  aspirin EC 81 MG tablet Take 81 mg by mouth daily.   Yes Historical  Provider, MD  citalopram (CELEXA) 20 MG tablet Take 20 mg by mouth daily.  12/16/10  Yes Historical Provider, MD  cyanocobalamin (,VITAMIN B-12,) 1000 MCG/ML injection Inject 1,000 mcg into the muscle every 30 (thirty) days.    Yes Historical Provider, MD  fluticasone (FLONASE) 50 MCG/ACT nasal spray Place 1 spray into both nostrils daily as needed for allergies.    Yes Historical Provider, MD  furosemide (LASIX) 40 MG tablet Take 1 tablet (40 mg total) by mouth daily. *MUST MAKE APPOINTMENT* 07/28/14  Yes Minus Breeding, MD  hydrocortisone (ANUSOL-HC) 25 MG suppository Place 25 mg rectally at bedtime as needed for hemorrhoids.    Yes Historical Provider, MD  levothyroxine (SYNTHROID, LEVOTHROID) 50 MCG tablet Take 50 mcg by mouth daily.    Yes Historical Provider, MD  meclizine (ANTIVERT) 12.5 MG tablet Take 12.5 mg by mouth 3 (three) times daily as needed for dizziness.  03/06/14 03/06/15 Yes Historical Provider, MD  Multiple Vitamin (MULTIVITAMIN WITH MINERALS) TABS tablet Take 1 tablet by mouth daily.   Yes Historical Provider, MD  Multiple Vitamins-Minerals (OCUVITE PRESERVISION PO) Take 1 tablet by mouth daily.   Yes Historical Provider, MD  NIFEdipine (PROCARDIA XL/ADALAT-CC) 60 MG 24 hr tablet Take 60 mg by mouth daily.   Yes Historical Provider, MD  nitroGLYCERIN (NITROSTAT) 0.4 MG SL tablet Place 0.4 mg under the tongue every 5 (five) minutes as needed for chest pain.   Yes Historical Provider, MD  omeprazole (PRILOSEC) 20 MG capsule Take 20 mg by mouth daily as needed (For heartburn or acid reflux.).  07/13/13  Yes Historical Provider, MD  Oxymetazoline HCl (NASAL DECONGESTANT SPRAY NA) Place 1 spray into the nose daily as needed (For congestion.).  06/22/14  Yes Historical Provider, MD  pravastatin (PRAVACHOL) 80 MG tablet Take 80 mg by mouth at bedtime.  03/29/12  Yes Historical Provider, MD  STOOL SOFTENER 100 MG capsule Take 100 mg by mouth daily as needed (For constipation.).  07/24/14  Yes  Historical Provider, MD   BP 159/94 mmHg  Pulse 65  Temp(Src) 97.4 F (36.3 C) (Oral)  Resp 18  SpO2 99% Physical Exam  Constitutional: She is oriented to person, place, and time. She appears well-developed and well-nourished. No distress.  HENT:  Head: Normocephalic and atraumatic.  Mouth/Throat: Oropharynx is clear and moist.  TMs are clear bilaterally.  Eyes: EOM are normal. Pupils are equal, round, and reactive to light.  There is no nystagmus.  Neck: Normal range of motion. Neck supple.  Cardiovascular: Normal rate and regular rhythm.  Exam reveals no gallop and no friction rub.   No murmur heard. Pulmonary/Chest: Effort normal and breath sounds normal. No respiratory distress. She  has no wheezes.  Abdominal: Soft. Bowel sounds are normal. She exhibits no distension. There is no tenderness.  Musculoskeletal: Normal range of motion.  Neurological: She is alert and oriented to person, place, and time. No cranial nerve deficit. She exhibits normal muscle tone. Coordination normal.  Skin: Skin is warm and dry. She is not diaphoretic.  Nursing note and vitals reviewed.   ED Course  Procedures (including critical care time) Labs Review Labs Reviewed  COMPREHENSIVE METABOLIC PANEL  CBC WITH DIFFERENTIAL/PLATELET  I-STAT TROPOININ, ED    Imaging Review No results found.   EKG Interpretation   Date/Time:  Monday Aug 14 2014 13:24:37 EDT Ventricular Rate:  65 PR Interval:  207 QRS Duration: 79 QT Interval:  486 QTC Calculation: 505 R Axis:   49 Text Interpretation:  Sinus rhythm Abnormal R-wave progression, early  transition Prolonged QT interval Confirmed by Marlaya Turck  MD, [place] ([id number]) on  08/14/2014 2:43:15 PM      MDM   Final diagnoses:  None    Patient presents with sudden onset nausea, vomiting, and dizziness. I suspect this is peripheral vertigo. Her laboratory studies are unremarkable and she is feeling better after by mouth meclizine and IV Zofran. She is  requesting to be discharged and I feel as though this is a reasonable disposition. She will be prescribed meclizine and advised to return as needed if her symptoms significantly worsen.    Veryl Speak, MD 08/14/14 [phone]

## 2014-08-14 NOTE — ED Notes (Signed)
Bed: IH03 Expected date:  Expected time:  Means of arrival:  Comments: 33f gen weakness n/v

## 2014-08-14 NOTE — Discharge Instructions (Signed)
Meclizine as prescribed as needed for dizziness.  Return to the emergency department if your symptoms significantly worsen or change.   Vertigo Vertigo means you feel like you or your surroundings are moving when they are not. Vertigo can be dangerous if it occurs when you are at work, driving, or performing difficult activities.  CAUSES  Vertigo occurs when there is a conflict of signals sent to your brain from the visual and sensory systems in your body. There are many different causes of vertigo, including:  Infections, especially in the inner ear.  A bad reaction to a drug or misuse of alcohol and medicines.  Withdrawal from drugs or alcohol.  Rapidly changing positions, such as lying down or rolling over in bed.  A migraine headache.  Decreased blood flow to the brain.  Increased pressure in the brain from a head injury, infection, tumor, or bleeding. SYMPTOMS  You may feel as though the world is spinning around or you are falling to the ground. Because your balance is upset, vertigo can cause nausea and vomiting. You may have involuntary eye movements (nystagmus). DIAGNOSIS  Vertigo is usually diagnosed by physical exam. If the cause of your vertigo is unknown, your caregiver may perform imaging tests, such as an MRI scan (magnetic resonance imaging). TREATMENT  Most cases of vertigo resolve on their own, without treatment. Depending on the cause, your caregiver may prescribe certain medicines. If your vertigo is related to body position issues, your caregiver may recommend movements or procedures to correct the problem. In rare cases, if your vertigo is caused by certain inner ear problems, you may need surgery. HOME CARE INSTRUCTIONS   Follow your caregiver's instructions.  Avoid driving.  Avoid operating heavy machinery.  Avoid performing any tasks that would be dangerous to you or others during a vertigo episode.  Tell your caregiver if you notice that certain  medicines seem to be causing your vertigo. Some of the medicines used to treat vertigo episodes can actually make them worse in some people. SEEK IMMEDIATE MEDICAL CARE IF:   Your medicines do not relieve your vertigo or are making it worse.  You develop problems with talking, walking, weakness, or using your arms, hands, or legs.  You develop severe headaches.  Your nausea or vomiting continues or gets worse.  You develop visual changes.  A family member notices behavioral changes.  Your condition gets worse. MAKE SURE YOU:  Understand these instructions.  Will watch your condition.  Will get help right away if you are not doing well or get worse. Document Released: 12/11/2004 Document Revised: 05/26/2011 Document Reviewed: 09/19/2010 [facility] Patient Information 2015 [place], [place]. This information is not intended to replace advice given to you by your health care provider. Make sure you discuss any questions you have with your health care provider.

## 2014-08-14 NOTE — ED Notes (Signed)
PER EMS- pt picked up from daughters home, c/o n/v and weakness x1hour.  Reported that pt went to grocery store with husband, after getting sick, pt wen to daughters home.  Pt arrived to ED alert and oriented per baseline.  PTA 20g IV to l forearm placed.  Denies pain.

## 2014-08-14 NOTE — ED Notes (Signed)
Patient transported to CT 

## 2014-09-01 ENCOUNTER — Other Ambulatory Visit: Payer: Self-pay | Admitting: Cardiology

## 2014-09-01 NOTE — Telephone Encounter (Signed)
Rx(s) sent to pharmacy electronically.  

## 2014-11-08 ENCOUNTER — Other Ambulatory Visit: Payer: Self-pay | Admitting: Cardiology

## 2015-02-06 ENCOUNTER — Encounter: Payer: Self-pay | Admitting: Vascular Surgery

## 2015-02-13 ENCOUNTER — Encounter: Payer: Self-pay | Admitting: Vascular Surgery

## 2015-02-13 ENCOUNTER — Ambulatory Visit (INDEPENDENT_AMBULATORY_CARE_PROVIDER_SITE_OTHER): Payer: Medicare HMO | Admitting: Vascular Surgery

## 2015-02-13 ENCOUNTER — Ambulatory Visit
Admission: RE | Admit: 2015-02-13 | Discharge: 2015-02-13 | Disposition: A | Discharge status: Home / Self Care | Payer: Medicare HMO | Source: Ambulatory Visit | Attending: Vascular Surgery | Admitting: Vascular Surgery

## 2015-02-13 VITALS — BP 135/85 | HR 89 | Temp 97.6°F | Resp 16 | Ht 65.0 in | Wt 138.0 lb

## 2015-02-13 DIAGNOSIS — Z48812 Encounter for surgical aftercare following surgery on the circulatory system: Secondary | ICD-10-CM

## 2015-02-13 DIAGNOSIS — I714 Abdominal aortic aneurysm, without rupture, unspecified: Secondary | ICD-10-CM

## 2015-02-13 NOTE — Progress Notes (Signed)
Subjective:     Patient ID: Krista AUGSBURGER, female   DOB: 09-01-1934, 80 y.o.   MRN: [id number]  HPI  This 80 year old female returns for continued follow-up regarding her aortic stent graft repair of an abdominal aortic aneurysm performed by me in 2014. 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 Her renal function has been relatively stable. She denies any new symptoms such as abdominal or back pain. She does have some mild hip discomfort with ambulation of 1/2-1 block but no calf claudication is reported.   Past Medical History  Diagnosis Date  . CAD (coronary artery disease)     50% proximal LAD stenosis, 50% marginal stenosis of the circumflex, RCA 95% stenosis, EF 40% stenosis with hypokinesis of the interior wall.  She had a Taxus stent placed in 04/16/2006  . Hypertension   . Abdominal aortic aneurysm ([place])   . Renal artery stenosis ([facility])     S/P stenting of the right renal atrophic left renal  . Dyslipidemia   . Hypothyroidism   . Tobacco user     Previous  . Adenomatous polyp 2004, 2011  . Hyperplastic colon polyp 2011  . Hemorrhoids   . Diverticulosis   . Renal insufficiency   . Anemia   . Myocardial infarction ([place])     stent 2008  . Constipation   . Cataract     Social History  Substance Use Topics  . Smoking status: Former Smoker -- 1 years    Types: Cigarettes    Quit date: 03/17/2006  . Smokeless tobacco: Never Used  . Alcohol Use: No    Family History  Problem Relation Age of Onset  . Uterine cancer Mother   . Heart attack Father 69  . Heart disease Father     before age 53  . Peripheral vascular disease Sister   . Other Sister     Renal artery stenosis  . Heart disease Sister     before age 61  . Heart attack Brother 31  . Heart disease Brother     before age 55  . Lung cancer Brother     \  . Colon cancer Neg Hx   . Breast cancer Maternal Aunt     x2    Allergies  Allergen Reactions  . Codeine Rash  . Sulfonamide Derivatives  Hives     Current outpatient prescriptions:  .  acetaminophen (TYLENOL) 325 MG tablet, Take 162.5-325 mg by mouth every 6 (six) hours as needed (For pain.). , Disp: , Rfl:  .  aspirin EC 81 MG tablet, Take 81 mg by mouth daily., Disp: , Rfl:  .  citalopram (CELEXA) 20 MG tablet, Take 20 mg by mouth daily. , Disp: , Rfl:  .  cyanocobalamin (,VITAMIN B-12,) 1000 MCG/ML injection, Inject 1,000 mcg into the muscle every 30 (thirty) days. , Disp: , Rfl:  .  fluticasone (FLONASE) 50 MCG/ACT nasal spray, Place 1 spray into both nostrils daily as needed for allergies. , Disp: , Rfl:  .  furosemide (LASIX) 40 MG tablet, Take 1 tablet (40 mg total) by mouth daily. *MUST MAKE APPOINTMENT*, Disp: 90 tablet, Rfl: 0 .  hydrocortisone (ANUSOL-HC) 25 MG suppository, Place 25 mg rectally at bedtime as needed for hemorrhoids. , Disp: , Rfl:  .  levothyroxine (SYNTHROID, LEVOTHROID) 50 MCG tablet, Take 50 mcg by mouth daily. , Disp: , Rfl:  .  meclizine (ANTIVERT) 25 MG tablet, Take 1 tablet (25 mg total) by mouth  3 (three) times daily as needed for dizziness., Disp: 12 tablet, Rfl: 0 .  Multiple Vitamin (MULTIVITAMIN WITH MINERALS) TABS tablet, Take 1 tablet by mouth daily., Disp: , Rfl:  .  Multiple Vitamins-Minerals (OCUVITE PRESERVISION PO), Take 1 tablet by mouth daily., Disp: , Rfl:  .  NIFEdipine (PROCARDIA XL/ADALAT-CC) 60 MG 24 hr tablet, Take 60 mg by mouth daily., Disp: , Rfl:  .  NIFEdipine (PROCARDIA-XL/ADALAT CC) 60 MG 24 hr tablet, TAKE 1 TABLET (60 MG TOTAL) BY MOUTH DAILY. PATIENT NEEDS TO CONTACT OFFICE FOR ADDITIONAL REFILLS, Disp: 30 tablet, Rfl: 6 .  nitroGLYCERIN (NITROSTAT) 0.4 MG SL tablet, Place 0.4 mg under the tongue every 5 (five) minutes as needed for chest pain., Disp: , Rfl:  .  omeprazole (PRILOSEC) 20 MG capsule, Take 20 mg by mouth daily as needed (For heartburn or acid reflux.). , Disp: , Rfl:  .  Oxymetazoline HCl (NASAL DECONGESTANT SPRAY NA), Place 1 spray into the nose daily  as needed (For congestion.). , Disp: , Rfl:  .  pravastatin (PRAVACHOL) 80 MG tablet, Take 80 mg by mouth at bedtime. , Disp: , Rfl:  .  STOOL SOFTENER 100 MG capsule, Take 100 mg by mouth daily as needed (For constipation.). , Disp: , Rfl:   Filed Vitals:   02/13/15 1230  BP: 135/85  Pulse: 89  Temp: 97.6 F (36.4 C)  Resp: 16  Height: 5\' 5"  (1.651 m)  Weight: 138 lb (62.596 kg)  SpO2: 98%    Body mass index is 22.96 kg/(m^2).          Review of Systems Denies chest pain, dyspnea on exertion, PND, orthopnea, hemoptysis. See history of present illness. Other systems negative and complete review of systems other than chronic back pain     Objective:   Physical Exam BP 135/85 mmHg  Pulse 89  Temp(Src) 97.6 F (36.4 C)  Resp 16  Ht 5\' 5"  (1.651 m)  Wt 138 lb (62.596 kg)  BMI 22.96 kg/m2  SpO2 98%  Gen.-alert and oriented x3 in no apparent distress HEENT normal for age Lungs no rhonchi or wheezing Cardiovascular regular rhythm no murmurs carotid pulses 3+ palpable no bruits audible Abdomen soft nontender no palpable masses Musculoskeletal free of  major deformities Skin clear -no rashes Neurologic normal Lower extremities 3+ femoral and  Posterior tibial pulses palpable bilaterally with no edema   today I ordered a CT angiogram of the abdomen and pelvis which I reviewed by computer looking at the images and also reading the report of the radiologist. Stent graft remains in good position. There's been no migration. No evidence of endoleak. Maximum diameter is about 4 cm.       Assessment:      doing well 2 years post stent graft repair of abdominal aortic aneurysm. No evidence of endoleak. Known chronic occlusion left renal artery.     Plan:      will return in 1 year with duplex scan of aortic stent graft repair of aortic aneurysm and be seen by nurse practitioner in the office    VASCULAR QUALITY INITIATIVE FOLLOW UP DATA:  Current smoker: [  ] yes  [+  ]  no  Living status: [+]  Home  [  ] Nursing home  [  ] Homeless    MEDS:  ASA [ + ] yes  [  ] no- [  ] medical reason  [  ] non compliant  STATIN  [ + ] yes  [  ]  no- [  ] medical reason  [  ] non compliant+  Beta blocker [ + ] yes  [  ] no- [  ] medical reason  [  ] non compliant  ACE inhibitor [ +] yes  [  ] no- [  ] medical reason  [  ] non compliant  P2Y12 Antagonist [ + ] none  [  ] clopidogrel-Plavix  [  ] ticlopidine-Ticlid   [  ] prasugrel-Effient  [  ] ticagrelor- Brilinta    Anticoagulant [  ] None  [  ] warfarin  [  ] rivaroxaban-Xarelto [  ] dabigatran- Pradaxa  Current Max AAA = 40 mm  Endoleak:  [ + no  [  ] yes- [  ] type I  [  ] type II  [  ] type III  [  ] indeterminate  Number of new interventions: 0 -   Date:      Why:  [  ] endoleak  [  ] limb occlusion   [  ] growth  [  ]  Migration   [  ] symtomatic/ rupture    Conversion to open repair: [  ] yes   [+ ] no   Date:   Other operation related to EVAR:  [  ] yes   [ + ] no

## 2015-02-13 NOTE — Addendum Note (Signed)
Addended by: Dorthula Rue L on: 02/13/2015 04:52 PM   Modules accepted: Orders

## 2015-03-27 ENCOUNTER — Other Ambulatory Visit: Payer: Self-pay | Admitting: *Deleted

## 2015-03-27 MED ORDER — NIFEDIPINE ER OSMOTIC RELEASE 60 MG PO TB24: 60.0000 mg | ORAL_TABLET | Freq: Every day | ORAL | Status: DC

## 2015-04-02 ENCOUNTER — Ambulatory Visit (INDEPENDENT_AMBULATORY_CARE_PROVIDER_SITE_OTHER): Payer: Medicare HMO | Admitting: Gastroenterology

## 2015-04-02 ENCOUNTER — Encounter: Payer: Self-pay | Admitting: Gastroenterology

## 2015-04-02 VITALS — BP 116/78 | HR 88 | Ht 60.75 in | Wt 138.0 lb

## 2015-04-02 DIAGNOSIS — K5909 Other constipation: Secondary | ICD-10-CM | POA: Diagnosis not present

## 2015-04-02 NOTE — Patient Instructions (Signed)
Please start taking citrucel (orange flavored) powder fiber supplement.  This may cause some bloating at first but that usually goes away. Begin with a small spoonful and work your way up to a large, heaping spoonful daily over a week. Please call in 4 weeks to report on your response.  May add linzess once daily if needed. Please avoid stool softeners as best that you can.

## 2015-04-02 NOTE — Progress Notes (Signed)
Review of pertinent gastrointestinal problems: 1. Adenomatous polyps.  Colonoscopy 2011 Dr. Deatra Ina found small TAs. Surveillance colonoscopy 06/2014 Dr. Ardis Hughs found two subCM polyps, one was a TA.  No surveillance recommended (she was 79 at that time). 2. Internal hemorrhoids; noted by colonoscopy.   HPI: This is a   very pleasant 81 year old woman whom I last saw the time of colonoscopy about 7 or 8 months ago.    Chief complaint is constipation, daily anal leakage, minor intermittent rectal bleeding   Anal leakage daily.  Minor rectal bleeding.  A problem for about 6 months.  Tends to have to really push and strain.  Takes 2-3 stool softners daily,  Takes metamucil on PRN basis. Sometimes takes 6 stool softeners a day.  Will have a solid stool, followed by liquid leaking throughout the day.  She often has to wear depends undergarments  Low back pains.  Her weight has overall been stable   Past Medical History  Diagnosis Date  . CAD (coronary artery disease)     50% proximal LAD stenosis, 50% marginal stenosis of the circumflex, RCA 95% stenosis, EF 40% stenosis with hypokinesis of the interior wall.  She had a Taxus stent placed in 04/16/2006  . Hypertension   . Abdominal aortic aneurysm ([place])   . Renal artery stenosis ([facility])     S/P stenting of the right renal atrophic left renal  . Dyslipidemia   . Hypothyroidism   . Tobacco user     Previous  . Adenomatous polyp 2004, 2011  . Hyperplastic colon polyp 2011  . Hemorrhoids   . Diverticulosis   . Renal insufficiency   . Anemia   . Myocardial infarction ([place])     stent 2008  . Constipation   . Cataract     Past Surgical History  Procedure Laterality Date  . Abdominal hysterectomy    . Carpal tunnel release Bilateral   . Cholecystectomy    . Flexible sigmoidoscopy  02/25/2012    Procedure: FLEXIBLE SIGMOIDOSCOPY;  Surgeon: Inda Castle, MD;  Location: [facility];  Service: Endoscopy;  Laterality: N/A;  .  Hemorrhoid banding  02/25/2012    Procedure: HEMORRHOID BANDING;  Surgeon: Inda Castle, MD;  Location: [facility];  Service: Endoscopy;  Laterality: N/A;  . Abdominal angiogram  12/21/2012    AAA  . Abdominal aortic endovascular stent graft N/A 12/22/2012    Procedure: ABDOMINAL AORTIC ENDOVASCULAR STENT GRAFT- GORE;  Surgeon: Mal Misty, MD;  Location: [place];  Service: Vascular;  Laterality: N/A;  Ultrasound guided  . Abdominal angiogram N/A 11/30/2012    Procedure: ABDOMINAL ANGIOGRAM;  Surgeon: Serafina Mitchell, MD;  Location: [facility] CATH LAB;  Service: Cardiovascular;  Laterality: N/A;  . Cataract extraction, bilateral    . Colonoscopy    . Polypectomy      Current Outpatient Prescriptions  Medication Sig Dispense Refill  . acetaminophen (TYLENOL) 325 MG tablet Take 162.5-325 mg by mouth every 6 (six) hours as needed (For pain.).     Marland Kitchen aspirin EC 81 MG tablet Take 81 mg by mouth daily.    . citalopram (CELEXA) 20 MG tablet Take 20 mg by mouth daily.     . cyanocobalamin (,VITAMIN B-12,) 1000 MCG/ML injection Inject 1,000 mcg into the muscle every 30 (thirty) days.     . fluticasone (FLONASE) 50 MCG/ACT nasal spray Place 1 spray into both nostrils daily as needed for allergies.     . furosemide (LASIX) 40 MG tablet Take  1 tablet (40 mg total) by mouth daily. *MUST MAKE APPOINTMENT* 90 tablet 0  . hydrocortisone (ANUSOL-HC) 25 MG suppository Place 25 mg rectally at bedtime as needed for hemorrhoids.     Marland Kitchen levothyroxine (SYNTHROID, LEVOTHROID) 50 MCG tablet Take 50 mcg by mouth daily.     . meclizine (ANTIVERT) 25 MG tablet Take 1 tablet (25 mg total) by mouth 3 (three) times daily as needed for dizziness. 12 tablet 0  . Multiple Vitamin (MULTIVITAMIN WITH MINERALS) TABS tablet Take 1 tablet by mouth daily.    . Multiple Vitamins-Minerals (OCUVITE PRESERVISION PO) Take 1 tablet by mouth daily.    Marland Kitchen NIFEdipine (PROCARDIA XL/ADALAT-CC) 60 MG 24 hr tablet Take 1 tablet (60 mg total) by  mouth daily. 30 tablet 0  . NIFEdipine (PROCARDIA-XL/ADALAT CC) 60 MG 24 hr tablet TAKE 1 TABLET (60 MG TOTAL) BY MOUTH DAILY. PATIENT NEEDS TO CONTACT OFFICE FOR ADDITIONAL REFILLS 30 tablet 6  . nitroGLYCERIN (NITROSTAT) 0.4 MG SL tablet Place 0.4 mg under the tongue every 5 (five) minutes as needed for chest pain.    Marland Kitchen omeprazole (PRILOSEC) 20 MG capsule Take 20 mg by mouth daily as needed (For heartburn or acid reflux.).     Marland Kitchen Oxymetazoline HCl (NASAL DECONGESTANT SPRAY NA) Place 1 spray into the nose daily as needed (For congestion.).     Marland Kitchen pravastatin (PRAVACHOL) 80 MG tablet Take 80 mg by mouth at bedtime.     . STOOL SOFTENER 100 MG capsule Take 100 mg by mouth daily as needed (For constipation.).      No current facility-administered medications for this visit.    Allergies as of 04/02/2015 - Review Complete 04/02/2015  Allergen Reaction Noted  . Codeine Rash 06/26/2014  . Sulfonamide derivatives Hives     Family History  Problem Relation Age of Onset  . Uterine cancer Mother   . Heart attack Father 79  . Heart disease Father     before age 98  . Peripheral vascular disease Sister   . Other Sister     Renal artery stenosis  . Heart disease Sister     before age 4  . Heart attack Brother 74  . Heart disease Brother     before age 60  . Lung cancer Brother     \  . Colon cancer Neg Hx   . Breast cancer Maternal Aunt     x2    Social History   Social History  . Marital Status: Married    Spouse Name: N/A  . Number of Children: 4  . Years of Education: N/A   Occupational History  . Works at a [employer] in [place]  . Smoking status: Former Smoker -- 1 years    Types: Cigarettes    Quit date: 03/17/2006  . Smokeless tobacco: Never Used  . Alcohol Use: No  . Drug Use: No  . Sexual Activity: Not on file   Other Topics Concern  . Not on file   Social History Narrative     Physical Exam: BP  116/78 mmHg  Pulse 88  Ht 5' 0.75" (1.543 m)  Wt 138 lb (62.596 kg)  BMI 26.29 kg/m2 Constitutional: generally well-appearing Psychiatric: alert and oriented x3 Abdomen: soft, nontender, nondistended, no obvious ascites, no peritoneal signs, normal bowel sounds Rectal examination with female assistant in the room: Small to medium sized nonthrombosed but obviously irritated, inflamed external anal hemorrhoid.  No obvious fissure disease, distal rectal exam revealed no masses. Stool is brown and not checked for Hemoccult  Assessment and plan: 81 y.o. female with chronic constipation, anal leakage, external hemorrhoid  I recommended that we try to treat what I believe is her primary issue of chronic constipation, pushing and straining on a daily basis to move her bowels. This can lead to hemorrhoidal symptoms, so anal leakage can result after hard constipated stools have been expelled. She takes stool softeners once to twice daily and I recommended she instead start bulking up with fiber supplements. I explained her that this usually results in a bulky her stool but it is soft easy to move in with a lot less liquid involved. She will call back to report on her symptoms after making this change in about 4 weeks' time. She had a colonoscopy less than a year ago and that does not need to be repeated now.   Owens Loffler, MD [facility] 04/02/2015, 9:41 AM

## 2015-04-04 ENCOUNTER — Other Ambulatory Visit: Payer: Self-pay | Admitting: *Deleted

## 2015-04-04 DIAGNOSIS — Z01810 Encounter for preprocedural cardiovascular examination: Secondary | ICD-10-CM

## 2015-04-04 DIAGNOSIS — N185 Chronic kidney disease, stage 5: Secondary | ICD-10-CM

## 2015-04-06 ENCOUNTER — Encounter: Payer: Self-pay | Admitting: Vascular Surgery

## 2015-04-09 ENCOUNTER — Encounter: Payer: Self-pay | Admitting: Vascular Surgery

## 2015-04-17 ENCOUNTER — Ambulatory Visit (INDEPENDENT_AMBULATORY_CARE_PROVIDER_SITE_OTHER)
Admission: RE | Admit: 2015-04-17 | Discharge: 2015-04-17 | Disposition: A | Discharge status: Home / Self Care | Payer: Medicare HMO | Source: Ambulatory Visit | Attending: Vascular Surgery | Admitting: Vascular Surgery

## 2015-04-17 ENCOUNTER — Other Ambulatory Visit: Payer: Self-pay

## 2015-04-17 ENCOUNTER — Ambulatory Visit (INDEPENDENT_AMBULATORY_CARE_PROVIDER_SITE_OTHER): Payer: Medicare HMO | Admitting: Vascular Surgery

## 2015-04-17 ENCOUNTER — Ambulatory Visit (HOSPITAL_COMMUNITY)
Admission: RE | Admit: 2015-04-17 | Discharge: 2015-04-17 | Disposition: A | Discharge status: Home / Self Care | Payer: Medicare HMO | Source: Ambulatory Visit | Attending: Vascular Surgery | Admitting: Vascular Surgery

## 2015-04-17 ENCOUNTER — Encounter: Payer: Self-pay | Admitting: Vascular Surgery

## 2015-04-17 VITALS — BP 158/102 | HR 79 | Temp 97.8°F | Resp 16 | Ht 65.0 in | Wt 136.0 lb

## 2015-04-17 DIAGNOSIS — Z01810 Encounter for preprocedural cardiovascular examination: Secondary | ICD-10-CM | POA: Insufficient documentation

## 2015-04-17 DIAGNOSIS — I120 Hypertensive chronic kidney disease with stage 5 chronic kidney disease or end stage renal disease: Secondary | ICD-10-CM | POA: Insufficient documentation

## 2015-04-17 DIAGNOSIS — E785 Hyperlipidemia, unspecified: Secondary | ICD-10-CM | POA: Diagnosis not present

## 2015-04-17 DIAGNOSIS — N185 Chronic kidney disease, stage 5: Secondary | ICD-10-CM | POA: Diagnosis not present

## 2015-04-17 DIAGNOSIS — N184 Chronic kidney disease, stage 4 (severe): Secondary | ICD-10-CM

## 2015-04-17 NOTE — Progress Notes (Signed)
Subjective:     Patient ID: [name], female   DOB: [birth year], 81 y.o.   MRN: [id number]  HPI this 81 year old female was referred by Dr. Erling Cruz for evaluation of vascular access. Patient is well known to me having undergone aortic aneurysm stent graft repair several years ago. She has been known to have chronic renal insufficiency which has progressively worsened. Currently her creatinine is 3.9. She is right-handed. She has never had hemodialysis in the past.  Past Medical History  Diagnosis Date  . CAD (coronary artery disease)     50% proximal LAD stenosis, 50% marginal stenosis of the circumflex, RCA 95% stenosis, EF 40% stenosis with hypokinesis of the interior wall.  She had a Taxus stent placed in 04/16/2006  . Hypertension   . Abdominal aortic aneurysm ([place])   . Renal artery stenosis (H[facility])     S/P stenting of the right renal atrophic left renal  . Dyslipidemia   . Hypothyroidism   . Tobacco user     Previous  . Adenomatous polyp 2004, 2011  . Hyperplastic colon polyp 2011  . Hemorrhoids   . Diverticulosis   . Renal insufficiency   . Anemia   . Myocardial infarction ([place])     stent 2008  . Constipation   . Cataract     Social History  Substance Use Topics  . Smoking status: Former Smoker -- 1 years    Types: Cigarettes    Quit date: 03/17/2006  . Smokeless tobacco: Never Used  . Alcohol Use: No    Family History  Problem Relation Age of Onset  . Uterine cancer Mother   . Heart attack Father 60  . Heart disease Father     before age 35  . Peripheral vascular disease Sister   . Other Sister     Renal artery stenosis  . Heart disease Sister     before age 47  . Heart attack Brother 11  . Heart disease Brother     before age 17  . Lung cancer Brother     \  . Colon cancer Neg Hx   . Breast cancer Maternal Aunt     x2    Allergies  Allergen Reactions  . Codeine Rash  . Sulfonamide Derivatives Hives     Current outpatient prescriptions:  .   acetaminophen (TYLENOL) 325 MG tablet, Take 162.5-325 mg by mouth every 6 (six) hours as needed (For pain.). , Disp: , Rfl:  .  aspirin EC 81 MG tablet, Take 81 mg by mouth daily., Disp: , Rfl:  .  citalopram (CELEXA) 20 MG tablet, Take 20 mg by mouth daily. , Disp: , Rfl:  .  cyanocobalamin (,VITAMIN B-12,) 1000 MCG/ML injection, Inject 1,000 mcg into the muscle every 30 (thirty) days. , Disp: , Rfl:  .  fluticasone (FLONASE) 50 MCG/ACT nasal spray, Place 1 spray into both nostrils daily as needed for allergies. , Disp: , Rfl:  .  furosemide (LASIX) 40 MG tablet, Take 1 tablet (40 mg total) by mouth daily. *MUST MAKE APPOINTMENT*, Disp: 90 tablet, Rfl: 0 .  hydrocortisone (ANUSOL-HC) 25 MG suppository, Place 25 mg rectally at bedtime as needed for hemorrhoids. , Disp: , Rfl:  .  levothyroxine (SYNTHROID, LEVOTHROID) 50 MCG tablet, Take 50 mcg by mouth daily. , Disp: , Rfl:  .  meclizine (ANTIVERT) 25 MG tablet, Take 1 tablet (25 mg total) by mouth 3 (three) times daily as needed for dizziness., Disp: 12 tablet, Rfl:  0 .  Multiple Vitamin (MULTIVITAMIN WITH MINERALS) TABS tablet, Take 1 tablet by mouth daily., Disp: , Rfl:  .  Multiple Vitamins-Minerals (OCUVITE PRESERVISION PO), Take 1 tablet by mouth daily., Disp: , Rfl:  .  NIFEdipine (PROCARDIA XL/ADALAT-CC) 60 MG 24 hr tablet, Take 1 tablet (60 mg total) by mouth daily., Disp: 30 tablet, Rfl: 0 .  NIFEdipine (PROCARDIA-XL/ADALAT CC) 60 MG 24 hr tablet, TAKE 1 TABLET (60 MG TOTAL) BY MOUTH DAILY. PATIENT NEEDS TO CONTACT OFFICE FOR ADDITIONAL REFILLS, Disp: 30 tablet, Rfl: 6 .  nitroGLYCERIN (NITROSTAT) 0.4 MG SL tablet, Place 0.4 mg under the tongue every 5 (five) minutes as needed for chest pain., Disp: , Rfl:  .  omeprazole (PRILOSEC) 20 MG capsule, Take 20 mg by mouth daily as needed (For heartburn or acid reflux.). , Disp: , Rfl:  .  Oxymetazoline HCl (NASAL DECONGESTANT SPRAY NA), Place 1 spray into the nose daily as needed (For  congestion.). , Disp: , Rfl:  .  pravastatin (PRAVACHOL) 80 MG tablet, Take 80 mg by mouth at bedtime. , Disp: , Rfl:  .  STOOL SOFTENER 100 MG capsule, Take 100 mg by mouth daily as needed (For constipation.). , Disp: , Rfl:   Filed Vitals:   04/17/15 1345 04/17/15 1349  BP: 159/103 155/102  Pulse: 83 79  Temp: 97.8 F (36.6 C)   TempSrc: Oral   Resp: 16   Height: 5\' 5"  (1.651 m)   Weight: 136 lb (61.689 kg)   SpO2: 98%     Body mass index is 22.63 kg/(m^2).           Review of Systems denies chest pain, dyspnea on exertion, PND, orthopnea, hemoptysis. Patient does have chronic hypertension, coronary artery disease which is currently asymptomatic, hyperlipidemia, hypothyroidism. Other systems negative and complete review of systems     Objective:   Physical Exam BP 155/102 mmHg  Pulse 79  Temp(Src) 97.8 F (36.6 C) (Oral)  Resp 16  Ht 5\' 5"  (1.651 m)  Wt 136 lb (61.689 kg)  BMI 22.63 kg/m2  SpO2 98% repeat blood pressure 156/103 Gen.-alert and oriented x3 in no apparent distress HEENT normal for age Lungs no rhonchi or wheezing Cardiovascular regular rhythm no murmurs carotid pulses 3+ palpable no bruits audible Abdomen soft nontender no palpable masses Musculoskeletal free of  major deformities Skin clear -no rashes Neurologic normal Lower extremities 3+ femoral and dorsalis pedis pulses palpable bilaterally with no edema  Upper extremity has what appears to be an adequate upper arm cephalic vein on the left with 3+ brachial radial pulse palpable. Vein mapping was performed today which I reviewed and interpreted. This did reveal good arterial flow bilaterally and an apparent satisfactory cephalic vein in the left upper arm.       Assessment:     Chronic kidney disease stage IV needs vascular access History of abdominal aortic aneurysm stent graft repair Coronary artery disease Hypertension History of hypothyroidism    Plan:     Plan left  brachial-cephalic AV fistula on Friday, February 3 as an outpatient. Possible steal syndrome or failure of fistula to mature was discussed with patient and she would like to proceed

## 2015-04-17 NOTE — Progress Notes (Signed)
Filed Vitals:   04/17/15 1345 04/17/15 1349 04/17/15 1408 04/17/15 1409  BP: 159/103 155/102 156/103 158/102  Pulse: 83 79    Temp: 97.8 F (36.6 C)     TempSrc: Oral     Resp: 16     Height: 5\' 5"  (1.651 m)     Weight: 136 lb (61.689 kg)     SpO2: 98%

## 2015-04-17 NOTE — Progress Notes (Signed)
Filed Vitals:   04/17/15 1345 04/17/15 1349  BP: 159/103 155/102  Pulse: 83 79  Temp: 97.8 F (36.6 C)   TempSrc: Oral   Resp: 16   Height: 5\' 5"  (1.651 m)   Weight: 136 lb (61.689 kg)   SpO2: 98%

## 2015-04-19 ENCOUNTER — Encounter (HOSPITAL_COMMUNITY): Payer: Self-pay | Admitting: *Deleted

## 2015-04-19 NOTE — Progress Notes (Signed)
Pt denies any recent chest pain or sob.  

## 2015-04-19 NOTE — Anesthesia Preprocedure Evaluation (Addendum)
Anesthesia Evaluation  Patient identified by MRN, date of birth, ID band Patient awake    Reviewed: Allergy & Precautions, NPO status , Patient's Chart, lab work & pertinent test results  Airway Mallampati: II  TM Distance: >3 FB Neck ROM: Full    Dental  (+) Teeth Intact   Pulmonary former smoker,    breath sounds clear to auscultation       Cardiovascular hypertension, Pt. on medications + CAD, + Past MI and + Peripheral Vascular Disease   Rhythm:Regular Rate:Normal  50% proximal LAD stenosis, 50% marginal stenosis of the circumflex, RCA 95% stenosis, EF 40% stenosis with hypokinesis of the interior wall.  She had a Taxus stent placed in 04/16/2006   Neuro/Psych PSYCHIATRIC DISORDERS negative neurological ROS     GI/Hepatic negative GI ROS, Neg liver ROS,   Endo/Other  Hypothyroidism   Renal/GU ESRFRenal disease  negative genitourinary   Musculoskeletal negative musculoskeletal ROS (+)   Abdominal   Peds negative pediatric ROS (+)  Hematology   Anesthesia Other Findings - AAA - Renal A Stenosis s/p stent  Reproductive/Obstetrics negative OB ROS                            Lab Results  Component Value Date   WBC 9.8 08/14/2014   HGB 10.0* 08/14/2014   HCT 30.6* 08/14/2014   MCV 93.6 08/14/2014   PLT 237 08/14/2014   Lab Results  Component Value Date   CREATININE 3.56* 08/14/2014   BUN 28* 08/14/2014   NA 141 08/14/2014   K 3.7 08/14/2014   CL 110 08/14/2014   CO2 22 08/14/2014   Lab Results  Component Value Date   INR 1.13 12/22/2012   INR 1.05 12/21/2012   INR 1.2 10/08/2006   07/2014 EKG: normal EKG, normal sinus rhythm.   Anesthesia Physical Anesthesia Plan  ASA: III  Anesthesia Plan: MAC   Post-op Pain Management:    Induction: Intravenous  Airway Management Planned: Natural Airway  Additional Equipment:   Intra-op Plan:   Post-operative Plan:    Informed Consent: I have reviewed the patients History and Physical, chart, labs and discussed the procedure including the risks, benefits and alternatives for the proposed anesthesia with the patient or authorized representative who has indicated his/her understanding and acceptance.   Dental advisory given  Plan Discussed with: CRNA  Anesthesia Plan Comments:         Anesthesia Quick Evaluation

## 2015-04-20 ENCOUNTER — Encounter (HOSPITAL_COMMUNITY)
Admission: RE | Disposition: A | Discharge status: Home / Self Care | Payer: Self-pay | Source: Ambulatory Visit | Attending: Vascular Surgery

## 2015-04-20 ENCOUNTER — Ambulatory Visit (HOSPITAL_COMMUNITY): Payer: Medicare HMO | Admitting: Anesthesiology

## 2015-04-20 ENCOUNTER — Ambulatory Visit (HOSPITAL_COMMUNITY)
Admission: RE | Admit: 2015-04-20 | Discharge: 2015-04-20 | Disposition: A | Discharge status: Home / Self Care | Payer: Medicare HMO | Source: Ambulatory Visit | Attending: Vascular Surgery | Admitting: Vascular Surgery

## 2015-04-20 ENCOUNTER — Encounter (HOSPITAL_COMMUNITY): Payer: Self-pay | Admitting: Anesthesiology

## 2015-04-20 ENCOUNTER — Other Ambulatory Visit: Payer: Self-pay | Admitting: *Deleted

## 2015-04-20 DIAGNOSIS — N184 Chronic kidney disease, stage 4 (severe): Secondary | ICD-10-CM | POA: Diagnosis not present

## 2015-04-20 DIAGNOSIS — I739 Peripheral vascular disease, unspecified: Secondary | ICD-10-CM | POA: Diagnosis not present

## 2015-04-20 DIAGNOSIS — I129 Hypertensive chronic kidney disease with stage 1 through stage 4 chronic kidney disease, or unspecified chronic kidney disease: Secondary | ICD-10-CM | POA: Diagnosis not present

## 2015-04-20 DIAGNOSIS — E785 Hyperlipidemia, unspecified: Secondary | ICD-10-CM | POA: Diagnosis not present

## 2015-04-20 DIAGNOSIS — Z95828 Presence of other vascular implants and grafts: Secondary | ICD-10-CM | POA: Diagnosis not present

## 2015-04-20 DIAGNOSIS — Z8679 Personal history of other diseases of the circulatory system: Secondary | ICD-10-CM | POA: Insufficient documentation

## 2015-04-20 DIAGNOSIS — I252 Old myocardial infarction: Secondary | ICD-10-CM | POA: Diagnosis not present

## 2015-04-20 DIAGNOSIS — Z87891 Personal history of nicotine dependence: Secondary | ICD-10-CM | POA: Diagnosis not present

## 2015-04-20 DIAGNOSIS — I701 Atherosclerosis of renal artery: Secondary | ICD-10-CM | POA: Diagnosis not present

## 2015-04-20 DIAGNOSIS — Z79899 Other long term (current) drug therapy: Secondary | ICD-10-CM | POA: Insufficient documentation

## 2015-04-20 DIAGNOSIS — Z7982 Long term (current) use of aspirin: Secondary | ICD-10-CM | POA: Diagnosis not present

## 2015-04-20 DIAGNOSIS — I2510 Atherosclerotic heart disease of native coronary artery without angina pectoris: Secondary | ICD-10-CM | POA: Insufficient documentation

## 2015-04-20 DIAGNOSIS — E039 Hypothyroidism, unspecified: Secondary | ICD-10-CM | POA: Diagnosis not present

## 2015-04-20 DIAGNOSIS — N186 End stage renal disease: Secondary | ICD-10-CM

## 2015-04-20 DIAGNOSIS — Z4931 Encounter for adequacy testing for hemodialysis: Secondary | ICD-10-CM

## 2015-04-20 HISTORY — PX: AV FISTULA PLACEMENT: SHX1204

## 2015-04-20 LAB — POCT I-STAT 4, (NA,K, GLUC, HGB,HCT)
GLUCOSE: 93 mg/dL (ref 65–99)
HCT: 30 % — ABNORMAL LOW (ref 36.0–46.0)
Hemoglobin: 10.2 g/dL — ABNORMAL LOW (ref 12.0–15.0)
POTASSIUM: 3.7 mmol/L (ref 3.5–5.1)
SODIUM: 142 mmol/L (ref 135–145)

## 2015-04-20 SURGERY — ARTERIOVENOUS (AV) FISTULA CREATION
Anesthesia: Monitor Anesthesia Care | Site: Arm Upper | Laterality: Left

## 2015-04-20 MED ORDER — LIDOCAINE-EPINEPHRINE (PF) 1 %-1:200000 IJ SOLN
INTRAMUSCULAR | Status: AC
  Filled 2015-04-20: qty 30

## 2015-04-20 MED ORDER — MIDAZOLAM HCL 2 MG/2ML IJ SOLN
INTRAMUSCULAR | Status: AC
  Filled 2015-04-20: qty 2

## 2015-04-20 MED ORDER — PROPOFOL 500 MG/50ML IV EMUL
INTRAVENOUS | Status: DC | PRN
  Administered 2015-04-20: 15 ug/kg/min via INTRAVENOUS

## 2015-04-20 MED ORDER — CHLORHEXIDINE GLUCONATE CLOTH 2 % EX PADS: 6.0000 | MEDICATED_PAD | Freq: Once | CUTANEOUS | Status: DC

## 2015-04-20 MED ORDER — FENTANYL CITRATE (PF) 100 MCG/2ML IJ SOLN
INTRAMUSCULAR | Status: DC | PRN
  Administered 2015-04-20 (×2): 50 ug via INTRAVENOUS

## 2015-04-20 MED ORDER — MEPERIDINE HCL 25 MG/ML IJ SOLN: 6.2500 mg | INTRAMUSCULAR | Status: DC | PRN

## 2015-04-20 MED ORDER — DEXTROSE 5 % IV SOLN
1.5000 g | INTRAVENOUS | Status: AC
  Administered 2015-04-20: 1.5 g via INTRAVENOUS
  Filled 2015-04-20: qty 1.5

## 2015-04-20 MED ORDER — FENTANYL CITRATE (PF) 100 MCG/2ML IJ SOLN
INTRAMUSCULAR | Status: AC
  Filled 2015-04-20: qty 2

## 2015-04-20 MED ORDER — SODIUM CHLORIDE 0.9 % IV SOLN
INTRAVENOUS | Status: DC | PRN
  Administered 2015-04-20: 500 mL

## 2015-04-20 MED ORDER — LIDOCAINE-EPINEPHRINE (PF) 1 %-1:200000 IJ SOLN
INTRAMUSCULAR | Status: DC | PRN
  Administered 2015-04-20: 16 mL

## 2015-04-20 MED ORDER — FENTANYL CITRATE (PF) 100 MCG/2ML IJ SOLN
25.0000 ug | INTRAMUSCULAR | Status: DC | PRN
  Administered 2015-04-20: 25 ug via INTRAVENOUS

## 2015-04-20 MED ORDER — TRAMADOL HCL 50 MG PO TABS: 50.0000 mg | ORAL_TABLET | Freq: Four times a day (QID) | ORAL | Status: AC | PRN

## 2015-04-20 MED ORDER — ONDANSETRON HCL 4 MG/2ML IJ SOLN
INTRAMUSCULAR | Status: DC | PRN
  Administered 2015-04-20: 4 mg via INTRAVENOUS

## 2015-04-20 MED ORDER — 0.9 % SODIUM CHLORIDE (POUR BTL) OPTIME
TOPICAL | Status: DC | PRN
  Administered 2015-04-20: 1000 mL

## 2015-04-20 MED ORDER — MIDAZOLAM HCL 5 MG/5ML IJ SOLN
INTRAMUSCULAR | Status: DC | PRN
  Administered 2015-04-20 (×2): 1 mg via INTRAVENOUS

## 2015-04-20 MED ORDER — SODIUM CHLORIDE 0.9 % IV SOLN: INTRAVENOUS | Status: DC

## 2015-04-20 MED ORDER — PHENYLEPHRINE HCL 10 MG/ML IJ SOLN
INTRAMUSCULAR | Status: DC | PRN
  Administered 2015-04-20 (×2): 80 ug via INTRAVENOUS

## 2015-04-20 MED ORDER — SODIUM CHLORIDE 0.9 % IV SOLN
INTRAVENOUS | Status: DC | PRN
  Administered 2015-04-20: 07:00:00 via INTRAVENOUS

## 2015-04-20 MED ORDER — PROPOFOL 10 MG/ML IV BOLUS
INTRAVENOUS | Status: AC
  Filled 2015-04-20: qty 20

## 2015-04-20 MED ORDER — FENTANYL CITRATE (PF) 250 MCG/5ML IJ SOLN
INTRAMUSCULAR | Status: AC
  Filled 2015-04-20: qty 5

## 2015-04-20 MED ORDER — LACTATED RINGERS IV SOLN: INTRAVENOUS | Status: DC

## 2015-04-20 MED ORDER — PROMETHAZINE HCL 25 MG/ML IJ SOLN: 6.2500 mg | INTRAMUSCULAR | Status: DC | PRN

## 2015-04-20 SURGICAL SUPPLY — 33 items
ARMBAND PINK RESTRICT EXTREMIT (MISCELLANEOUS) ×3 IMPLANT
CANISTER SUCTION 2500CC (MISCELLANEOUS) ×3 IMPLANT
CLIP TI MEDIUM 6 (CLIP) ×3 IMPLANT
CLIP TI WIDE RED SMALL 6 (CLIP) ×5 IMPLANT
COVER PROBE W GEL 5X96 (DRAPES) ×2 IMPLANT
DRAIN PENROSE 1/4X12 LTX STRL (WOUND CARE) ×3 IMPLANT
ELECT REM PT RETURN 9FT ADLT (ELECTROSURGICAL) ×3
ELECTRODE REM PT RTRN 9FT ADLT (ELECTROSURGICAL) ×1 IMPLANT
GEL ULTRASOUND 20GR AQUASONIC (MISCELLANEOUS) ×2 IMPLANT
GLOVE BIO SURGEON STRL SZ 6.5 (GLOVE) ×4 IMPLANT
GLOVE BIO SURGEONS STRL SZ 6.5 (GLOVE) ×4
GLOVE BIOGEL PI IND STRL 6.5 (GLOVE) IMPLANT
GLOVE BIOGEL PI IND STRL 8 (GLOVE) IMPLANT
GLOVE BIOGEL PI INDICATOR 6.5 (GLOVE) ×12
GLOVE BIOGEL PI INDICATOR 8 (GLOVE) ×2
GLOVE ECLIPSE 6.5 STRL STRAW (GLOVE) ×2 IMPLANT
GLOVE SS BIOGEL STRL SZ 7 (GLOVE) ×1 IMPLANT
GLOVE SUPERSENSE BIOGEL SZ 7 (GLOVE) ×2
GLOVE SURG SS PI 7.5 STRL IVOR (GLOVE) ×2 IMPLANT
GOWN SPEC L4 XLG W/TWL (GOWN DISPOSABLE) ×2 IMPLANT
GOWN STRL REUS W/ TWL LRG LVL3 (GOWN DISPOSABLE) ×3 IMPLANT
GOWN STRL REUS W/TWL LRG LVL3 (GOWN DISPOSABLE) ×15
KIT BASIN OR (CUSTOM PROCEDURE TRAY) ×3 IMPLANT
KIT ROOM TURNOVER OR (KITS) ×3 IMPLANT
LIQUID BAND (GAUZE/BANDAGES/DRESSINGS) ×3 IMPLANT
NS IRRIG 1000ML POUR BTL (IV SOLUTION) ×3 IMPLANT
PACK CV ACCESS (CUSTOM PROCEDURE TRAY) ×3 IMPLANT
PAD ARMBOARD 7.5X6 YLW CONV (MISCELLANEOUS) ×6 IMPLANT
SUT PROLENE 6 0 BV (SUTURE) ×3 IMPLANT
SUT VIC AB 3-0 SH 27 (SUTURE) ×3
SUT VIC AB 3-0 SH 27X BRD (SUTURE) ×1 IMPLANT
UNDERPAD 30X30 INCONTINENT (UNDERPADS AND DIAPERS) ×3 IMPLANT
WATER STERILE IRR 1000ML POUR (IV SOLUTION) ×3 IMPLANT

## 2015-04-20 NOTE — Interval H&P Note (Signed)
History and Physical Interval Note:  04/20/2015 7:30 AM  [name]  has presented today for surgery, with the diagnosis of Stage IV Chronic Kidney Disease N18.4  The various methods of treatment have been discussed with the patient and family. After consideration of risks, benefits and other options for treatment, the patient has consented to  Procedure(s): BRACHIOCEPHALIC ARTERIOVENOUS (AV) FISTULA CREATION (Left) as a surgical intervention .  The patient's history has been reviewed, patient examined, no change in status, stable for surgery.  I have reviewed the patient's chart and labs.  Questions were answered to the patient's satisfaction.     Tinnie Gens

## 2015-04-20 NOTE — Anesthesia Procedure Notes (Signed)
Procedure Name: MAC Date/Time: 04/20/2015 7:39 AM Performed by: Jacquiline Doe A Pre-anesthesia Checklist: Patient identified, Timeout performed, Emergency Drugs available, Suction available and Patient being monitored Patient Re-evaluated:Patient Re-evaluated prior to inductionOxygen Delivery Method: Nasal cannula Intubation Type: IV induction Placement Confirmation: positive ETCO2 Dental Injury: Teeth and Oropharynx as per pre-operative assessment

## 2015-04-20 NOTE — Anesthesia Postprocedure Evaluation (Signed)
Anesthesia Post Note  Patient: [name]  Procedure(s) Performed: Procedure(s) (LRB): CREATION OF LEFT UPPER ARM BRACHIOCEPHALIC ARTERIOVENOUS (AV) FISTULA   (Left)  Patient location during evaluation: PACU Anesthesia Type: MAC Level of consciousness: awake and alert Pain management: pain level controlled Vital Signs Assessment: post-procedure vital signs reviewed and stable Respiratory status: spontaneous breathing, nonlabored ventilation, respiratory function stable and patient connected to nasal cannula oxygen Cardiovascular status: stable and blood pressure returned to baseline Anesthetic complications: no    Last Vitals:  Filed Vitals:   04/20/15 0930 04/20/15 0945  BP: 129/74 133/89  Pulse: 72 73  Temp:    Resp: 12 12    Last Pain: There were no vitals filed for this visit.               Effie Berkshire

## 2015-04-20 NOTE — H&P (View-Only) (Signed)
Subjective:     Patient ID: [name], female   DOB: [birth year], 81 y.o.   MRN: [id number]  HPI this 81 year old female was referred by Dr. Erling Cruz for evaluation of vascular access. Patient is well known to me having undergone aortic aneurysm stent graft repair several years ago. She has been known to have chronic renal insufficiency which has progressively worsened. Currently her creatinine is 3.9. She is right-handed. She has never had hemodialysis in the past.  Past Medical History  Diagnosis Date  . CAD (coronary artery disease)     50% proximal LAD stenosis, 50% marginal stenosis of the circumflex, RCA 95% stenosis, EF 40% stenosis with hypokinesis of the interior wall.  She had a Taxus stent placed in 04/16/2006  . Hypertension   . Abdominal aortic aneurysm ([place])   . Renal artery stenosis ([facility])     S/P stenting of the right renal atrophic left renal  . Dyslipidemia   . Hypothyroidism   . Tobacco user     Previous  . Adenomatous polyp 2004, 2011  . Hyperplastic colon polyp 2011  . Hemorrhoids   . Diverticulosis   . Renal insufficiency   . Anemia   . Myocardial infarction ([place])     stent 2008  . Constipation   . Cataract     Social History  Substance Use Topics  . Smoking status: Former Smoker -- 1 years    Types: Cigarettes    Quit date: 03/17/2006  . Smokeless tobacco: Never Used  . Alcohol Use: No    Family History  Problem Relation Age of Onset  . Uterine cancer Mother   . Heart attack Father 22  . Heart disease Father     before age 25  . Peripheral vascular disease Sister   . Other Sister     Renal artery stenosis  . Heart disease Sister     before age 99  . Heart attack Brother 44  . Heart disease Brother     before age 34  . Lung cancer Brother     \  . Colon cancer Neg Hx   . Breast cancer Maternal Aunt     x2    Allergies  Allergen Reactions  . Codeine Rash  . Sulfonamide Derivatives Hives     Current outpatient prescriptions:  .   acetaminophen (TYLENOL) 325 MG tablet, Take 162.5-325 mg by mouth every 6 (six) hours as needed (For pain.). , Disp: , Rfl:  .  aspirin EC 81 MG tablet, Take 81 mg by mouth daily., Disp: , Rfl:  .  citalopram (CELEXA) 20 MG tablet, Take 20 mg by mouth daily. , Disp: , Rfl:  .  cyanocobalamin (,VITAMIN B-12,) 1000 MCG/ML injection, Inject 1,000 mcg into the muscle every 30 (thirty) days. , Disp: , Rfl:  .  fluticasone (FLONASE) 50 MCG/ACT nasal spray, Place 1 spray into both nostrils daily as needed for allergies. , Disp: , Rfl:  .  furosemide (LASIX) 40 MG tablet, Take 1 tablet (40 mg total) by mouth daily. *MUST MAKE APPOINTMENT*, Disp: 90 tablet, Rfl: 0 .  hydrocortisone (ANUSOL-HC) 25 MG suppository, Place 25 mg rectally at bedtime as needed for hemorrhoids. , Disp: , Rfl:  .  levothyroxine (SYNTHROID, LEVOTHROID) 50 MCG tablet, Take 50 mcg by mouth daily. , Disp: , Rfl:  .  meclizine (ANTIVERT) 25 MG tablet, Take 1 tablet (25 mg total) by mouth 3 (three) times daily as needed for dizziness., Disp: 12 tablet, Rfl:  0 .  Multiple Vitamin (MULTIVITAMIN WITH MINERALS) TABS tablet, Take 1 tablet by mouth daily., Disp: , Rfl:  .  Multiple Vitamins-Minerals (OCUVITE PRESERVISION PO), Take 1 tablet by mouth daily., Disp: , Rfl:  .  NIFEdipine (PROCARDIA XL/ADALAT-CC) 60 MG 24 hr tablet, Take 1 tablet (60 mg total) by mouth daily., Disp: 30 tablet, Rfl: 0 .  NIFEdipine (PROCARDIA-XL/ADALAT CC) 60 MG 24 hr tablet, TAKE 1 TABLET (60 MG TOTAL) BY MOUTH DAILY. PATIENT NEEDS TO CONTACT OFFICE FOR ADDITIONAL REFILLS, Disp: 30 tablet, Rfl: 6 .  nitroGLYCERIN (NITROSTAT) 0.4 MG SL tablet, Place 0.4 mg under the tongue every 5 (five) minutes as needed for chest pain., Disp: , Rfl:  .  omeprazole (PRILOSEC) 20 MG capsule, Take 20 mg by mouth daily as needed (For heartburn or acid reflux.). , Disp: , Rfl:  .  Oxymetazoline HCl (NASAL DECONGESTANT SPRAY NA), Place 1 spray into the nose daily as needed (For  congestion.). , Disp: , Rfl:  .  pravastatin (PRAVACHOL) 80 MG tablet, Take 80 mg by mouth at bedtime. , Disp: , Rfl:  .  STOOL SOFTENER 100 MG capsule, Take 100 mg by mouth daily as needed (For constipation.). , Disp: , Rfl:   Filed Vitals:   04/17/15 1345 04/17/15 1349  BP: 159/103 155/102  Pulse: 83 79  Temp: 97.8 F (36.6 C)   TempSrc: Oral   Resp: 16   Height: 5\' 5"  (1.651 m)   Weight: 136 lb (61.689 kg)   SpO2: 98%     Body mass index is 22.63 kg/(m^2).           Review of Systems denies chest pain, dyspnea on exertion, PND, orthopnea, hemoptysis. Patient does have chronic hypertension, coronary artery disease which is currently asymptomatic, hyperlipidemia, hypothyroidism. Other systems negative and complete review of systems     Objective:   Physical Exam BP 155/102 mmHg  Pulse 79  Temp(Src) 97.8 F (36.6 C) (Oral)  Resp 16  Ht 5\' 5"  (1.651 m)  Wt 136 lb (61.689 kg)  BMI 22.63 kg/m2  SpO2 98% repeat blood pressure 156/103 Gen.-alert and oriented x3 in no apparent distress HEENT normal for age Lungs no rhonchi or wheezing Cardiovascular regular rhythm no murmurs carotid pulses 3+ palpable no bruits audible Abdomen soft nontender no palpable masses Musculoskeletal free of  major deformities Skin clear -no rashes Neurologic normal Lower extremities 3+ femoral and dorsalis pedis pulses palpable bilaterally with no edema  Upper extremity has what appears to be an adequate upper arm cephalic vein on the left with 3+ brachial radial pulse palpable. Vein mapping was performed today which I reviewed and interpreted. This did reveal good arterial flow bilaterally and an apparent satisfactory cephalic vein in the left upper arm.       Assessment:     Chronic kidney disease stage IV needs vascular access History of abdominal aortic aneurysm stent graft repair Coronary artery disease Hypertension History of hypothyroidism    Plan:     Plan left  brachial-cephalic AV fistula on Friday, February 3 as an outpatient. Possible steal syndrome or failure of fistula to mature was discussed with patient and she would like to proceed

## 2015-04-20 NOTE — Transfer of Care (Signed)
Immediate Anesthesia Transfer of Care Note  Patient: [name]  Procedure(s) Performed: Procedure(s): CREATION OF LEFT UPPER ARM BRACHIOCEPHALIC ARTERIOVENOUS (AV) FISTULA   (Left)  Patient Location: PACU  Anesthesia Type:MAC  Level of Consciousness: awake, alert , oriented and patient cooperative  Airway & Oxygen Therapy: Patient Spontanous Breathing and Patient connected to nasal cannula oxygen  Post-op Assessment: Report given to RN, Post -op Vital signs reviewed and stable and Patient moving all extremities  Post vital signs: Reviewed and stable  Last Vitals:  Filed Vitals:   04/20/15 0634  BP: 125/68  Pulse: 77  Resp: 16    Complications: No apparent anesthesia complications

## 2015-04-20 NOTE — Op Note (Signed)
OPERATIVE REPORT  Date of Surgery: 04/20/2015  Surgeon: Tinnie Gens, MD  Assistant: Silva Bandy PA  Pre-op Diagnosis: Stage IV Chronic Kidney Disease N18.4  Post-op Diagnosis: Stage IV Chronic Kidney Disease N18.4  Procedure: Procedure(s): CREATION OF LEFT UPPER ARM BRACHIOCEPHALIC ARTERIOVENOUS (AV) FISTULA    Anesthesia: Mac  EBL: Minimal  Complications: None  Procedure Details: The patient was taken the operating room placed in supine position at which time left upper extremity was prepped Betadine scrub and solution draped in routine sterile manner. After infiltration forms and Xylocaine with epinephrine transverse incision was made in the antecubital area antecubital vein dissected free. Cephalic branch was felt to be satisfactory in the upper arm for brachiocephalic fistula. It was ligated distally and transected gently dilated with heparinized saline. It was an excellent vein about 3-1/2 mm in size. Brachial artery was slightly smaller vessel was exposed beneath fashion circumflex with Vesseloops. It was fairly tortuous. Artery was then occluded proximal and distally with Vesseloops opened 15 blade extended with Potts scissors. The vein was slightly spatulated and anastomosed end-to-side with 6-0 Prolene. Vessel loops were then released and there was a good pulse and palpable thrill in the fistula up to the shoulder level. I then imaged this with the B-mode ultrasound-sono site looking for side branches and I saw no significant side branches. There was significant diminution of flow distally in the wrist both in the ulnar and radial artery. With compression of the fistula of the flow returned significantly. On her flow never disappeared completely with the fistula open although the radial arterial flow did diminish significantly area we observe this in the OR for about 20 minutes. Patient initially complained of some tingling in the left hand which then resolves. She stated she had no pain  or numbness in the left hand. We decided to go ahead and close and observe her in the PACU for steal syndrome. Wound was closed in layers with 5-0 subcuticular fashion sterile dressing applied and the patient continued to state that her left hand felt normal. Taken to recovery room in satisfactory condition  Tinnie Gens, MD 04/20/2015 9:15 AM

## 2015-04-23 ENCOUNTER — Telehealth: Payer: Self-pay | Admitting: Vascular Surgery

## 2015-04-23 NOTE — Telephone Encounter (Addendum)
-----   Message from Mena Goes, RN sent at 04/20/2015 10:48 AM EST ----- Regarding: schedule   ----- Message -----    From: Alvia Grove, PA-C    Sent: 04/20/2015   9:56 AM      To: [facility] Charge Pool  S/p left brachial-cephalic AVF [id number]  F/u with Dr. Kellie Simmering in 6 weeks with duplex.  Thanks Kim notified patient of post op appointment with dr. Kellie Simmering on 06-05-15 at [place] for lab and 10:45am with md.

## 2015-04-24 ENCOUNTER — Encounter (HOSPITAL_COMMUNITY): Payer: Self-pay | Admitting: Vascular Surgery

## 2015-05-07 ENCOUNTER — Telehealth: Payer: Self-pay | Admitting: Gastroenterology

## 2015-05-07 NOTE — Telephone Encounter (Signed)
Pt has been notified to begin miralax and continue citrucel.  She will call in 2-3 weeks to update on her condition

## 2015-05-07 NOTE — Telephone Encounter (Signed)
Ok, lets try her adding miralax, one dose every day.  She should stay on the twice daily citrucel as well.  Call back in 2-3 weeks to report on her response.

## 2015-05-07 NOTE — Telephone Encounter (Signed)
Left message on machine to call back  

## 2015-05-07 NOTE — Telephone Encounter (Signed)
The pt is taking citrucel 1 tablespoon every morning and 1 every night.  She states she has not had any relief. She is straining to have a bowel movement and usually only  has 1 per day that is very small hard stool.  She also has increased her fluid intake.  Please advise

## 2015-06-05 ENCOUNTER — Encounter: Payer: Medicare HMO | Admitting: Vascular Surgery

## 2015-06-05 ENCOUNTER — Encounter (HOSPITAL_COMMUNITY): Payer: Medicare HMO

## 2015-06-06 ENCOUNTER — Telehealth: Payer: Self-pay | Admitting: Gastroenterology

## 2015-06-06 ENCOUNTER — Encounter: Payer: Self-pay | Admitting: Vascular Surgery

## 2015-06-06 NOTE — Telephone Encounter (Signed)
The pt is taking 1 tablespoon of miralax daily and is only having 2-3 small hard bowel movements daily.  Per your last office note; Please call in 4 weeks to report on your response. May add linzess once daily if needed.   Do you want me to send the linzess to the pharmacy?

## 2015-06-07 MED ORDER — LINACLOTIDE 145 MCG PO CAPS: 145.0000 ug | ORAL_CAPSULE | Freq: Every day | ORAL | Status: AC

## 2015-06-07 NOTE — Telephone Encounter (Signed)
Yes, lets add low dose linzess, one pill once daily, disp 30 with 6 refills.  Have her call in 3-4 weeks to report on her response.

## 2015-06-07 NOTE — Telephone Encounter (Signed)
Prescription sent and pt aware, she will call back with a response to the medication in 4 weeks

## 2015-06-11 ENCOUNTER — Other Ambulatory Visit: Payer: Self-pay | Admitting: Cardiology

## 2015-06-11 NOTE — Telephone Encounter (Signed)
Rx refill sent to pharmacy. 

## 2015-06-12 ENCOUNTER — Ambulatory Visit (INDEPENDENT_AMBULATORY_CARE_PROVIDER_SITE_OTHER): Payer: Medicare HMO | Admitting: Vascular Surgery

## 2015-06-12 ENCOUNTER — Encounter: Payer: Self-pay | Admitting: Vascular Surgery

## 2015-06-12 ENCOUNTER — Ambulatory Visit (HOSPITAL_COMMUNITY)
Admission: RE | Admit: 2015-06-12 | Discharge: 2015-06-12 | Disposition: A | Discharge status: Home / Self Care | Payer: Medicare HMO | Source: Ambulatory Visit | Attending: Vascular Surgery | Admitting: Vascular Surgery

## 2015-06-12 VITALS — BP 154/95 | HR 81 | Temp 99.2°F | Resp 14 | Ht 65.0 in | Wt 138.0 lb

## 2015-06-12 DIAGNOSIS — E785 Hyperlipidemia, unspecified: Secondary | ICD-10-CM | POA: Insufficient documentation

## 2015-06-12 DIAGNOSIS — Z4931 Encounter for adequacy testing for hemodialysis: Secondary | ICD-10-CM | POA: Insufficient documentation

## 2015-06-12 DIAGNOSIS — I120 Hypertensive chronic kidney disease with stage 5 chronic kidney disease or end stage renal disease: Secondary | ICD-10-CM | POA: Insufficient documentation

## 2015-06-12 DIAGNOSIS — N186 End stage renal disease: Secondary | ICD-10-CM | POA: Diagnosis not present

## 2015-06-12 DIAGNOSIS — Z720 Tobacco use: Secondary | ICD-10-CM | POA: Insufficient documentation

## 2015-06-12 DIAGNOSIS — I252 Old myocardial infarction: Secondary | ICD-10-CM | POA: Insufficient documentation

## 2015-06-12 DIAGNOSIS — I2510 Atherosclerotic heart disease of native coronary artery without angina pectoris: Secondary | ICD-10-CM | POA: Insufficient documentation

## 2015-06-12 DIAGNOSIS — N184 Chronic kidney disease, stage 4 (severe): Secondary | ICD-10-CM

## 2015-06-12 NOTE — Progress Notes (Signed)
Subjective:     Patient ID: [name], female   DOB: 03/20/1934, 81 y.o.   MRN: [id number]  HPI This 81 year old female Ternes for follow-up regarding her left brachial-cephalic AV fistula created on February 3. She denies any pain or numbness in the left hand. It does feel cool occasionally. He is not on hemodialysis. She is being evaluated for possible renal transplantation at [facility].   Review of Systems     Objective:   Physical Exam BP 154/95 mmHg  Pulse 81  Temp(Src) 99.2 F (37.3 C)  Resp 14  Ht 5\' 5"  (1.651 m)  Wt 138 lb (62.596 kg)  BMI 22.96 kg/m2  SpO2 97%   Gen. Alert and oriented 3 in no apparent distress  lungs no rhonchi or wheezing Left upper extremity with excellent pulse and palpable thrill and brachialcephalic AV fistula. 2+ radial pulse palpable distally. Left and adequately perfused.   today I ordered of a duplex scan of the left brachial-cephalic AV fistula. There is one very small side branch. There is good flow throughout the fistula. There is possibly some occlusive disease in the brachial artery proximal to the anastomosis.     Assessment:      nicely functioning left brachial-cephalic AV fistula -could be utilized Jul 18 2015 if needed  Patient being evaluated for possible renal transplant and currently is not on hemodialysis     Plan:      patient returned in 6 months for follow-up of the aortic stent graft which I placed a few years ago. She will get a duplex scan in our office and see Dr. Servando Snare       Follow-up of the AV fistula will be on a when necessary basis

## 2015-06-12 NOTE — Progress Notes (Signed)
Filed Vitals:   06/12/15 1531 06/12/15 1534  BP: 145/91 154/95  Pulse: 91 81  Temp: 99.2 F (37.3 C)   Resp: 14   Height: 5\' 5"  (1.651 m)   Weight: 138 lb (62.596 kg)   SpO2: 97%

## 2015-07-12 ENCOUNTER — Other Ambulatory Visit: Payer: Self-pay | Admitting: Cardiology

## 2015-07-12 NOTE — Telephone Encounter (Signed)
Rx has been sent to the pharmacy electronically. ° °

## 2015-08-01 ENCOUNTER — Encounter (HOSPITAL_COMMUNITY): Payer: Self-pay | Admitting: Emergency Medicine

## 2015-08-01 ENCOUNTER — Emergency Department (HOSPITAL_COMMUNITY): Payer: Medicare HMO

## 2015-08-01 ENCOUNTER — Other Ambulatory Visit (HOSPITAL_COMMUNITY): Payer: Medicare HMO

## 2015-08-01 ENCOUNTER — Inpatient Hospital Stay (HOSPITAL_COMMUNITY)
Admission: EM | Admit: 2015-08-01 | Discharge: 2015-08-16 | DRG: 189 | Disposition: A | Discharge status: Home Health | Payer: Medicare HMO | Attending: Internal Medicine | Admitting: Internal Medicine

## 2015-08-01 DIAGNOSIS — R071 Chest pain on breathing: Secondary | ICD-10-CM

## 2015-08-01 DIAGNOSIS — J189 Pneumonia, unspecified organism: Secondary | ICD-10-CM | POA: Diagnosis not present

## 2015-08-01 DIAGNOSIS — R0602 Shortness of breath: Secondary | ICD-10-CM | POA: Diagnosis not present

## 2015-08-01 DIAGNOSIS — I2510 Atherosclerotic heart disease of native coronary artery without angina pectoris: Secondary | ICD-10-CM | POA: Diagnosis present

## 2015-08-01 DIAGNOSIS — R05 Cough: Secondary | ICD-10-CM

## 2015-08-01 DIAGNOSIS — D509 Iron deficiency anemia, unspecified: Secondary | ICD-10-CM | POA: Diagnosis present

## 2015-08-01 DIAGNOSIS — D649 Anemia, unspecified: Secondary | ICD-10-CM | POA: Diagnosis present

## 2015-08-01 DIAGNOSIS — R0902 Hypoxemia: Secondary | ICD-10-CM | POA: Diagnosis present

## 2015-08-01 DIAGNOSIS — R778 Other specified abnormalities of plasma proteins: Secondary | ICD-10-CM | POA: Diagnosis present

## 2015-08-01 DIAGNOSIS — I5021 Acute systolic (congestive) heart failure: Secondary | ICD-10-CM | POA: Diagnosis present

## 2015-08-01 DIAGNOSIS — N184 Chronic kidney disease, stage 4 (severe): Secondary | ICD-10-CM | POA: Diagnosis present

## 2015-08-01 DIAGNOSIS — Z801 Family history of malignant neoplasm of trachea, bronchus and lung: Secondary | ICD-10-CM

## 2015-08-01 DIAGNOSIS — R918 Other nonspecific abnormal finding of lung field: Secondary | ICD-10-CM | POA: Insufficient documentation

## 2015-08-01 DIAGNOSIS — I252 Old myocardial infarction: Secondary | ICD-10-CM

## 2015-08-01 DIAGNOSIS — R7989 Other specified abnormal findings of blood chemistry: Secondary | ICD-10-CM | POA: Diagnosis present

## 2015-08-01 DIAGNOSIS — D473 Essential (hemorrhagic) thrombocythemia: Secondary | ICD-10-CM | POA: Diagnosis not present

## 2015-08-01 DIAGNOSIS — I248 Other forms of acute ischemic heart disease: Secondary | ICD-10-CM | POA: Diagnosis present

## 2015-08-01 DIAGNOSIS — Z8049 Family history of malignant neoplasm of other genital organs: Secondary | ICD-10-CM

## 2015-08-01 DIAGNOSIS — I509 Heart failure, unspecified: Secondary | ICD-10-CM | POA: Insufficient documentation

## 2015-08-01 DIAGNOSIS — D860 Sarcoidosis of lung: Secondary | ICD-10-CM | POA: Diagnosis present

## 2015-08-01 DIAGNOSIS — Z7982 Long term (current) use of aspirin: Secondary | ICD-10-CM

## 2015-08-01 DIAGNOSIS — D7589 Other specified diseases of blood and blood-forming organs: Secondary | ICD-10-CM | POA: Diagnosis not present

## 2015-08-01 DIAGNOSIS — Z87891 Personal history of nicotine dependence: Secondary | ICD-10-CM

## 2015-08-01 DIAGNOSIS — I714 Abdominal aortic aneurysm, without rupture, unspecified: Secondary | ICD-10-CM | POA: Diagnosis present

## 2015-08-01 DIAGNOSIS — J849 Interstitial pulmonary disease, unspecified: Secondary | ICD-10-CM | POA: Insufficient documentation

## 2015-08-01 DIAGNOSIS — E785 Hyperlipidemia, unspecified: Secondary | ICD-10-CM | POA: Diagnosis present

## 2015-08-01 DIAGNOSIS — J9601 Acute respiratory failure with hypoxia: Principal | ICD-10-CM | POA: Diagnosis present

## 2015-08-01 DIAGNOSIS — E871 Hypo-osmolality and hyponatremia: Secondary | ICD-10-CM | POA: Diagnosis not present

## 2015-08-01 DIAGNOSIS — J984 Other disorders of lung: Secondary | ICD-10-CM | POA: Insufficient documentation

## 2015-08-01 DIAGNOSIS — I132 Hypertensive heart and chronic kidney disease with heart failure and with stage 5 chronic kidney disease, or end stage renal disease: Secondary | ICD-10-CM | POA: Diagnosis present

## 2015-08-01 DIAGNOSIS — I272 Other secondary pulmonary hypertension: Secondary | ICD-10-CM | POA: Diagnosis present

## 2015-08-01 DIAGNOSIS — E039 Hypothyroidism, unspecified: Secondary | ICD-10-CM | POA: Diagnosis present

## 2015-08-01 DIAGNOSIS — J9811 Atelectasis: Secondary | ICD-10-CM | POA: Diagnosis present

## 2015-08-01 DIAGNOSIS — R059 Cough, unspecified: Secondary | ICD-10-CM

## 2015-08-01 DIAGNOSIS — E876 Hypokalemia: Secondary | ICD-10-CM | POA: Diagnosis not present

## 2015-08-01 DIAGNOSIS — Z955 Presence of coronary angioplasty implant and graft: Secondary | ICD-10-CM

## 2015-08-01 DIAGNOSIS — Z79899 Other long term (current) drug therapy: Secondary | ICD-10-CM

## 2015-08-01 DIAGNOSIS — I5043 Acute on chronic combined systolic (congestive) and diastolic (congestive) heart failure: Secondary | ICD-10-CM | POA: Diagnosis present

## 2015-08-01 DIAGNOSIS — D638 Anemia in other chronic diseases classified elsewhere: Secondary | ICD-10-CM | POA: Diagnosis present

## 2015-08-01 DIAGNOSIS — N185 Chronic kidney disease, stage 5: Secondary | ICD-10-CM | POA: Diagnosis present

## 2015-08-01 DIAGNOSIS — Z803 Family history of malignant neoplasm of breast: Secondary | ICD-10-CM

## 2015-08-01 DIAGNOSIS — R079 Chest pain, unspecified: Secondary | ICD-10-CM

## 2015-08-01 DIAGNOSIS — N179 Acute kidney failure, unspecified: Secondary | ICD-10-CM | POA: Diagnosis present

## 2015-08-01 DIAGNOSIS — B348 Other viral infections of unspecified site: Secondary | ICD-10-CM | POA: Diagnosis present

## 2015-08-01 DIAGNOSIS — Z9049 Acquired absence of other specified parts of digestive tract: Secondary | ICD-10-CM

## 2015-08-01 DIAGNOSIS — Z8249 Family history of ischemic heart disease and other diseases of the circulatory system: Secondary | ICD-10-CM

## 2015-08-01 DIAGNOSIS — R0600 Dyspnea, unspecified: Secondary | ICD-10-CM | POA: Insufficient documentation

## 2015-08-01 HISTORY — DX: Abdominal aortic aneurysm, without rupture, unspecified: I71.40

## 2015-08-01 HISTORY — DX: Abdominal aortic aneurysm, without rupture: I71.4

## 2015-08-01 LAB — BASIC METABOLIC PANEL
Anion gap: 13 (ref 5–15)
BUN: 35 mg/dL — AB (ref 6–20)
CHLORIDE: 104 mmol/L (ref 101–111)
CO2: 20 mmol/L — AB (ref 22–32)
CREATININE: 4.56 mg/dL — AB (ref 0.44–1.00)
Calcium: 7.6 mg/dL — ABNORMAL LOW (ref 8.9–10.3)
GFR calc Af Amer: 10 mL/min — ABNORMAL LOW (ref >60)
GFR calc non Af Amer: 8 mL/min — ABNORMAL LOW (ref >60)
Glucose, Bld: 106 mg/dL — ABNORMAL HIGH (ref 65–99)
POTASSIUM: 3.5 mmol/L (ref 3.5–5.1)
SODIUM: 137 mmol/L (ref 135–145)

## 2015-08-01 LAB — INFLUENZA PANEL BY PCR (TYPE A & B)
H1N1FLUPCR: NOT DETECTED
INFLAPCR: NEGATIVE
INFLBPCR: NEGATIVE

## 2015-08-01 LAB — CBC
HEMATOCRIT: 24.8 % — AB (ref 36.0–46.0)
Hemoglobin: 8.2 g/dL — ABNORMAL LOW (ref 12.0–15.0)
MCH: 30.9 pg (ref 26.0–34.0)
MCHC: 33.1 g/dL (ref 30.0–36.0)
MCV: 93.6 fL (ref 78.0–100.0)
PLATELETS: 300 10*3/uL (ref 150–400)
RBC: 2.65 MIL/uL — ABNORMAL LOW (ref 3.87–5.11)
RDW: 13.7 % (ref 11.5–15.5)
WBC: 11.1 10*3/uL — AB (ref 4.0–10.5)

## 2015-08-01 LAB — VITAMIN B12: Vitamin B-12: 2631 pg/mL — ABNORMAL HIGH (ref 180–914)

## 2015-08-01 LAB — PROCALCITONIN: PROCALCITONIN: 0.25 ng/mL

## 2015-08-01 LAB — I-STAT TROPONIN, ED: Troponin i, poc: 0.22 ng/mL (ref 0.00–0.08)

## 2015-08-01 LAB — IRON AND TIBC
IRON: 14 ug/dL — AB (ref 28–170)
Saturation Ratios: 5 % — ABNORMAL LOW (ref 10.4–31.8)
TIBC: 273 ug/dL (ref 250–450)
UIBC: 259 ug/dL

## 2015-08-01 LAB — FERRITIN: FERRITIN: 52 ng/mL (ref 11–307)

## 2015-08-01 LAB — RETICULOCYTES
RBC.: 2.39 MIL/uL — ABNORMAL LOW (ref 3.87–5.11)
RETIC CT PCT: 1.8 % (ref 0.4–3.1)
Retic Count, Absolute: 43 10*3/uL (ref 19.0–186.0)

## 2015-08-01 LAB — BRAIN NATRIURETIC PEPTIDE: B Natriuretic Peptide: 3396.8 pg/mL — ABNORMAL HIGH (ref 0.0–100.0)

## 2015-08-01 LAB — TSH: TSH: 4.418 u[IU]/mL (ref 0.350–4.500)

## 2015-08-01 LAB — FOLATE: Folate: 20.4 ng/mL (ref >5.9)

## 2015-08-01 LAB — TROPONIN I: Troponin I: 0.28 ng/mL — ABNORMAL HIGH (ref <0.031)

## 2015-08-01 MED ORDER — DEXTROSE 5 % IV SOLN: 1.0000 g | Freq: Once | INTRAVENOUS | Status: DC

## 2015-08-01 MED ORDER — NIFEDIPINE ER OSMOTIC RELEASE 30 MG PO TB24
60.0000 mg | ORAL_TABLET | Freq: Every day | ORAL | Status: DC
  Administered 2015-08-02 – 2015-08-12 (×11): 60 mg via ORAL
  Filled 2015-08-01: qty 2
  Filled 2015-08-01 (×4): qty 1
  Filled 2015-08-01: qty 2
  Filled 2015-08-01 (×3): qty 1
  Filled 2015-08-01 (×2): qty 2

## 2015-08-01 MED ORDER — AZITHROMYCIN 250 MG PO TABS
500.0000 mg | ORAL_TABLET | ORAL | Status: DC
  Filled 2015-08-01: qty 2

## 2015-08-01 MED ORDER — BISACODYL 10 MG RE SUPP: 10.0000 mg | Freq: Every day | RECTAL | Status: DC | PRN

## 2015-08-01 MED ORDER — TRAZODONE HCL 50 MG PO TABS
25.0000 mg | ORAL_TABLET | Freq: Every evening | ORAL | Status: DC | PRN
  Administered 2015-08-08 – 2015-08-09 (×2): 25 mg via ORAL
  Filled 2015-08-01 (×2): qty 1

## 2015-08-01 MED ORDER — ONDANSETRON HCL 4 MG PO TABS: 4.0000 mg | ORAL_TABLET | Freq: Four times a day (QID) | ORAL | Status: DC | PRN

## 2015-08-01 MED ORDER — SODIUM CHLORIDE 0.9% FLUSH
3.0000 mL | Freq: Two times a day (BID) | INTRAVENOUS | Status: DC
  Administered 2015-08-01 – 2015-08-07 (×9): 3 mL via INTRAVENOUS

## 2015-08-01 MED ORDER — FUROSEMIDE 10 MG/ML IJ SOLN
40.0000 mg | Freq: Two times a day (BID) | INTRAMUSCULAR | Status: DC
  Administered 2015-08-02 (×2): 40 mg via INTRAVENOUS
  Filled 2015-08-01 (×3): qty 4

## 2015-08-01 MED ORDER — DEXTROSE 5 % IV SOLN
1.0000 g | INTRAVENOUS | Status: DC
  Administered 2015-08-01 – 2015-08-03 (×3): 1 g via INTRAVENOUS
  Filled 2015-08-01 (×4): qty 10

## 2015-08-01 MED ORDER — FUROSEMIDE 10 MG/ML IJ SOLN: 80.0000 mg | Freq: Every day | INTRAMUSCULAR | Status: DC

## 2015-08-01 MED ORDER — MECLIZINE HCL 25 MG PO TABS: 25.0000 mg | ORAL_TABLET | Freq: Three times a day (TID) | ORAL | Status: DC | PRN

## 2015-08-01 MED ORDER — ONDANSETRON HCL 4 MG/2ML IJ SOLN: 4.0000 mg | Freq: Four times a day (QID) | INTRAMUSCULAR | Status: DC | PRN

## 2015-08-01 MED ORDER — FUROSEMIDE 10 MG/ML IJ SOLN
40.0000 mg | Freq: Once | INTRAMUSCULAR | Status: AC
  Administered 2015-08-01: 40 mg via INTRAVENOUS
  Filled 2015-08-01: qty 4

## 2015-08-01 MED ORDER — LINACLOTIDE 145 MCG PO CAPS
145.0000 ug | ORAL_CAPSULE | Freq: Every day | ORAL | Status: DC
  Administered 2015-08-01 – 2015-08-16 (×16): 145 ug via ORAL
  Filled 2015-08-01 (×16): qty 1

## 2015-08-01 MED ORDER — SODIUM CHLORIDE 0.9 % IV SOLN: 250.0000 mL | INTRAVENOUS | Status: DC | PRN

## 2015-08-01 MED ORDER — PANTOPRAZOLE SODIUM 40 MG PO TBEC
40.0000 mg | DELAYED_RELEASE_TABLET | Freq: Every day | ORAL | Status: DC
  Administered 2015-08-01 – 2015-08-16 (×16): 40 mg via ORAL
  Filled 2015-08-01 (×17): qty 1

## 2015-08-01 MED ORDER — HEPARIN SODIUM (PORCINE) 5000 UNIT/ML IJ SOLN
5000.0000 [IU] | Freq: Three times a day (TID) | INTRAMUSCULAR | Status: DC
  Administered 2015-08-01 – 2015-08-08 (×17): 5000 [IU] via SUBCUTANEOUS
  Filled 2015-08-01 (×18): qty 1

## 2015-08-01 MED ORDER — ALBUTEROL SULFATE (2.5 MG/3ML) 0.083% IN NEBU
2.5000 mg | INHALATION_SOLUTION | Freq: Once | RESPIRATORY_TRACT | Status: AC
  Administered 2015-08-01: 2.5 mg via RESPIRATORY_TRACT
  Filled 2015-08-01: qty 3

## 2015-08-01 MED ORDER — TRAMADOL HCL 50 MG PO TABS
50.0000 mg | ORAL_TABLET | Freq: Four times a day (QID) | ORAL | Status: DC | PRN
  Administered 2015-08-07 – 2015-08-08 (×4): 50 mg via ORAL
  Filled 2015-08-01 (×4): qty 1

## 2015-08-01 MED ORDER — DEXTROSE 5 % IV SOLN
500.0000 mg | INTRAVENOUS | Status: DC
  Administered 2015-08-01 – 2015-08-03 (×3): 500 mg via INTRAVENOUS
  Filled 2015-08-01 (×4): qty 500

## 2015-08-01 MED ORDER — DEXTROSE 5 % IV SOLN: 500.0000 mg | Freq: Once | INTRAVENOUS | Status: DC

## 2015-08-01 MED ORDER — LEVOTHYROXINE SODIUM 50 MCG PO TABS
50.0000 ug | ORAL_TABLET | Freq: Every day | ORAL | Status: DC
  Administered 2015-08-02 – 2015-08-16 (×14): 50 ug via ORAL
  Filled 2015-08-01 (×14): qty 1

## 2015-08-01 MED ORDER — HYDROCORTISONE ACETATE 25 MG RE SUPP: 25.0000 mg | Freq: Every evening | RECTAL | Status: DC | PRN

## 2015-08-01 MED ORDER — SODIUM CHLORIDE 0.9% FLUSH
3.0000 mL | Freq: Two times a day (BID) | INTRAVENOUS | Status: DC
  Administered 2015-08-03 – 2015-08-14 (×11): 3 mL via INTRAVENOUS

## 2015-08-01 MED ORDER — HEPARIN SODIUM (PORCINE) 5000 UNIT/ML IJ SOLN: 5000.0000 [IU] | Freq: Three times a day (TID) | INTRAMUSCULAR | Status: DC

## 2015-08-01 MED ORDER — PRAVASTATIN SODIUM 40 MG PO TABS
80.0000 mg | ORAL_TABLET | Freq: Every day | ORAL | Status: DC
  Administered 2015-08-01 – 2015-08-15 (×15): 80 mg via ORAL
  Filled 2015-08-01 (×7): qty 2
  Filled 2015-08-01: qty 4
  Filled 2015-08-01 (×7): qty 2

## 2015-08-01 MED ORDER — FUROSEMIDE 20 MG PO TABS: 40.0000 mg | ORAL_TABLET | Freq: Every day | ORAL | Status: DC

## 2015-08-01 MED ORDER — ACETAMINOPHEN 325 MG PO TABS
650.0000 mg | ORAL_TABLET | ORAL | Status: DC | PRN
  Administered 2015-08-01 – 2015-08-07 (×2): 650 mg via ORAL
  Filled 2015-08-01 (×2): qty 2

## 2015-08-01 MED ORDER — DEXTROSE 5 % IV SOLN
1.0000 g | INTRAVENOUS | Status: DC
  Administered 2015-08-01: 1 g via INTRAVENOUS
  Filled 2015-08-01: qty 10

## 2015-08-01 MED ORDER — FLUTICASONE PROPIONATE 50 MCG/ACT NA SUSP
1.0000 | Freq: Every day | NASAL | Status: DC | PRN
Start: 2015-08-01 — End: 2015-08-16
  Filled 2015-08-01: qty 16

## 2015-08-01 MED ORDER — CITALOPRAM HYDROBROMIDE 20 MG PO TABS
20.0000 mg | ORAL_TABLET | Freq: Every day | ORAL | Status: DC
  Administered 2015-08-01 – 2015-08-16 (×16): 20 mg via ORAL
  Filled 2015-08-01 (×16): qty 1

## 2015-08-01 MED ORDER — SODIUM CHLORIDE 0.9% FLUSH: 3.0000 mL | INTRAVENOUS | Status: DC | PRN

## 2015-08-01 NOTE — Progress Notes (Signed)
Ceftriaxone for CAP per pharmacy ordered.  Ceftriaxone does not need renal adjustment.  [employer] policy allows pharmacy to change the ordered dose based on indication without contacting the physician, therefore a consult is not needed.   Plan: -ceftriaxone 1g IV q24h -pharmacy to sign off as no adjustment needed.

## 2015-08-01 NOTE — ED Notes (Signed)
Reported critical Troponin to Dr. Marily Memos via phone.

## 2015-08-01 NOTE — Discharge Planning (Signed)
ED Case Management Note  Subjective/Objective:   81 y.o. female past medical history that includes CAD, hypertension, anemia, AAA stage IV renal failure presents emergency Department chief complaint persistent worsening shortness of breath. Initial evaluation concerning for acute heart failure.   Expected Discharge Date:          08/03/15       Action/Plan:  Follow for disposition needs. Marylyn Ishihara choice for home health services.

## 2015-08-01 NOTE — ED Notes (Signed)
Lab called critical result Troponin 0.22 to [facility]. Admitting MD paged.

## 2015-08-01 NOTE — ED Notes (Signed)
Placed pt on bedpan, one assist.

## 2015-08-01 NOTE — H&P (Signed)
[facility] History and Physical  [name] [id number] DOB: 1934/06/19 DOA: 08/01/2015  Referring physician: Tyrone Nine PCP: Sherrie Mustache, MD   Chief Complaint: sob  HPI: [name] is a very pleasant 81 y.o. female past medical history that includes CAD, hypertension, anemia, AAA stage IV renal failure presents emergency Department chief complaint persistent worsening shortness of breath. Initial evaluation concerning for acute heart failure.  Information is obtained from the patient. She states she developed "a cold" 2 days ago. She went to see her PCP was diagnosed with a sinus infection and given antibiotics. Since then she has continued to cough basic yellow sputum and she feels her shortness of breath is worsening. Associated symptoms include nausea anorexia subjective fever. She denies chest pain palpitations headache dizziness syncope or near-syncope. She denies lower extremity edema or orthopnea. She denies abdominal pain dysuria hematuria frequency or urgency she denies constipation diarrhea melena bright red blood per rectum.  In the emergency department she is afebrile hypertensive and her oxygen saturation level drops to 88% with conversation on room air.  In the emergency department she's given IV Lasix antibiotics.   Review of Systems:  10 point review of systems complete and all systems are negative except as indicated in the history of present illness  Past Medical History  Diagnosis Date  . CAD (coronary artery disease)     50% proximal LAD stenosis, 50% marginal stenosis of the circumflex, RCA 95% stenosis, EF 40% stenosis with hypokinesis of the interior wall.  She had a Taxus stent placed in 04/16/2006  . Hypertension   . Abdominal aortic aneurysm ([place])   . Renal artery stenosis (HCC)     S/P stenting of the right renal atrophic left renal  . Dyslipidemia   . Hypothyroidism   . Tobacco user     Previous  . Adenomatous polyp 2004, 2011  .  Hyperplastic colon polyp 2011  . Hemorrhoids   . Diverticulosis   . Renal insufficiency   . Anemia   . Myocardial infarction ([place])     stent 2008  . Constipation   . Cataract    Past Surgical History  Procedure Laterality Date  . Abdominal hysterectomy    . Carpal tunnel release Bilateral   . Cholecystectomy    . Flexible sigmoidoscopy  02/25/2012    Procedure: FLEXIBLE SIGMOIDOSCOPY;  Surgeon: Inda Castle, MD;  Location: [facility];  Service: Endoscopy;  Laterality: N/A;  . Hemorrhoid banding  02/25/2012    Procedure: HEMORRHOID BANDING;  Surgeon: Inda Castle, MD;  Location: [facility];  Service: Endoscopy;  Laterality: N/A;  . Abdominal angiogram  12/21/2012    AAA  . Abdominal aortic endovascular stent graft N/A 12/22/2012    Procedure: ABDOMINAL AORTIC ENDOVASCULAR STENT GRAFT- GORE;  Surgeon: Mal Misty, MD;  Location: [facility];  Service: Vascular;  Laterality: N/A;  Ultrasound guided  . Abdominal angiogram N/A 11/30/2012    Procedure: ABDOMINAL ANGIOGRAM;  Surgeon: Serafina Mitchell, MD;  [facility];  Service: Cardiovascular;  Laterality: N/A;  . Cataract extraction, bilateral    . Colonoscopy    . Polypectomy    . Cardiac catheterization  2008  . Av fistula placement Left 04/20/2015    Procedure: CREATION OF LEFT UPPER ARM BRACHIOCEPHALIC ARTERIOVENOUS (AV) FISTULA  ;  Surgeon: Mal Misty, MD;  Location: [address];  Service: Vascular;  Laterality: Left;   Social History:  reports that she quit smoking about 9 years ago. Her  smoking use included Cigarettes. She quit after 1 year of use. She has never used smokeless tobacco. She reports that she does not drink alcohol or use illicit drugs. She lives at home with her husband and has done so for the last 65 years. She is a retired Engineer, manufacturing systems. She stopped smoking in 2006 she ambulates without any assistance she is independent with ADLs Allergies  Allergen Reactions  . Codeine Rash  . Sulfonamide  Derivatives Hives    Family History  Problem Relation Age of Onset  . Uterine cancer Mother   . Heart attack Father 93  . Heart disease Father     before age 67  . Peripheral vascular disease Sister   . Other Sister     Renal artery stenosis  . Heart disease Sister     before age 14  . Heart attack Brother 78  . Heart disease Brother     before age 61  . Lung cancer Brother     \  . Colon cancer Neg Hx   . Breast cancer Maternal Aunt     x2     Prior to Admission medications   Medication Sig Start Date End Date Taking? Authorizing Provider  acetaminophen (TYLENOL) 325 MG tablet Take 162.5-325 mg by mouth every 6 (six) hours as needed (For pain.).    Yes Historical Provider, MD  aspirin EC 81 MG tablet Take 81 mg by mouth daily.   Yes Historical Provider, MD  citalopram (CELEXA) 20 MG tablet Take 20 mg by mouth daily.  12/16/10  Yes Historical Provider, MD  cyanocobalamin (,VITAMIN B-12,) 1000 MCG/ML injection Inject 1,000 mcg into the muscle every 30 (thirty) days.    Yes Historical Provider, MD  fluticasone (FLONASE) 50 MCG/ACT nasal spray Place 1 spray into both nostrils daily as needed for allergies.    Yes Historical Provider, MD  furosemide (LASIX) 40 MG tablet Take 1 tablet (40 mg total) by mouth daily. *MUST MAKE APPOINTMENT* 07/28/14  Yes Minus Breeding, MD  hydrocortisone (ANUSOL-HC) 25 MG suppository Place 25 mg rectally at bedtime as needed for hemorrhoids.    Yes Historical Provider, MD  levothyroxine (SYNTHROID, LEVOTHROID) 50 MCG tablet Take 50 mcg by mouth daily.    Yes Historical Provider, MD  Linaclotide Rolan Lipa) 145 MCG CAPS capsule Take 1 capsule (145 mcg total) by mouth daily. 06/07/15  Yes Milus Banister, MD  meclizine (ANTIVERT) 25 MG tablet Take 1 tablet (25 mg total) by mouth 3 (three) times daily as needed for dizziness. 08/14/14  Yes Veryl Speak, MD  Multiple Vitamins-Minerals (OCUVITE PRESERVISION PO) Take 1 tablet by mouth daily.   Yes Historical  Provider, MD  NIFEdipine (PROCARDIA XL/ADALAT-CC) 60 MG 24 hr tablet TAKE 1 TABLET (60 MG TOTAL) BY MOUTH DAILY. PATIENT NEEDS TO CONTACT OFFICE FOR ADDITIONAL REFILLS 07/12/15  Yes Minus Breeding, MD  nitroGLYCERIN (NITROSTAT) 0.4 MG SL tablet Place 0.4 mg under the tongue every 5 (five) minutes as needed for chest pain.   Yes Historical Provider, MD  omeprazole (PRILOSEC) 20 MG capsule Take 20 mg by mouth daily as needed (For heartburn or acid reflux.).  07/13/13  Yes Historical Provider, MD  Oxymetazoline HCl (NASAL DECONGESTANT SPRAY NA) Place 1 spray into the nose daily as needed (For congestion.).  06/22/14  Yes Historical Provider, MD  pravastatin (PRAVACHOL) 80 MG tablet Take 80 mg by mouth at bedtime.  03/29/12  Yes Historical Provider, MD  traMADol (ULTRAM) 50 MG tablet Take 1 tablet (50  mg total) by mouth every 6 (six) hours as needed. Patient taking differently: Take 50 mg by mouth every 6 (six) hours as needed for moderate pain.  04/20/15  Yes Alvia [place], PA-C   Physical Exam: Filed Vitals:   08/01/15 1530 08/01/15 1612 08/01/15 1615 08/01/15 1706  BP: 175/100 187/99 165/112   Pulse: 93 99 96   Temp:    102.1 F (38.9 C)  TempSrc:    Oral  Resp: 30 27 17    SpO2: 89% 94% 91%     Wt Readings from Last 3 Encounters:  06/12/15 62.596 kg (138 lb)  04/20/15 61.689 kg (136 lb)  04/17/15 61.689 kg (136 lb)    General:  Appears calm and comfortable, slightly pale Eyes: PERRL, normal lids, irises & conjunctiva ENT: grossly normal hearing, lips & tongue, mucous membranes of her mouth are pink slightly dry Neck: no LAD, masses or thyromegaly Cardiovascular: RRR, no m/r/g. No LE edema. Pedal pulses present and palpable  Respiratory: Mild increased work of breathing with conversation breath sounds diminished throughout and crackles heard bilateral bases particularly on the left eye hear no wheeze no rhonchi Abdomen: soft, ntnd no guarding or rebounding Skin: no rash or induration  seen on limited exam Musculoskeletal: grossly normal tone BUE/BLE, joints without swelling/erythema Psychiatric: grossly normal mood and affect, speech fluent and appropriate Neurologic: grossly non-focal. Speech clear facial symmetry           Labs on Admission:  Basic Metabolic Panel:  Recent Labs Lab 08/01/15 1231  NA 137  K 3.5  CL 104  CO2 20*  GLUCOSE 106*  BUN 35*  CREATININE 4.56*  CALCIUM 7.6*   Liver Function Tests: No results for input(s): AST, ALT, ALKPHOS, BILITOT, PROT, ALBUMIN in the last 168 hours. No results for input(s): LIPASE, AMYLASE in the last 168 hours. No results for input(s): AMMONIA in the last 168 hours. CBC:  Recent Labs Lab 08/01/15 1231  WBC 11.1*  HGB 8.2*  HCT 24.8*  MCV 93.6  PLT 300   Cardiac Enzymes: No results for input(s): CKTOTAL, CKMB, CKMBINDEX, TROPONINI in the last 168 hours.  BNP (last 3 results)  Recent Labs  08/01/15 1458  BNP 3396.8*    ProBNP (last 3 results) No results for input(s): PROBNP in the last 8760 hours.  CBG: No results for input(s): GLUCAP in the last 168 hours.  Radiological Exams on Admission: Dg Chest 2 View  08/01/2015  CLINICAL DATA:  Shortness of breath. EXAM: CHEST  2 VIEW COMPARISON:  December 22, 2012. FINDINGS: Stable cardiomegaly. No pneumothorax or pleural effusion is noted. Atherosclerosis of thoracic aorta is noted. Diffuse interstitial and patchy lung opacities are noted bilaterally most consistent with edema or less likely inflammation. Bony thorax is unremarkable. IMPRESSION: Bilateral diffuse interstitial and patchy lung opacities are noted concerning for edema or less likely inflammation. Electronically Signed   By: Marijo Conception, M.D.   On: 08/01/2015 13:05    EKG: Independently reviewed. Sinus rhythm with Premature supraventricular complexes Prolonged QT Abnormal ECG Since last tracing rate faster Otherwise no significant change  Assessment/Plan Principal Problem:    Hypoxia Active Problems:   Coronary atherosclerosis   AAA (abdominal aortic aneurysm) (HCC)   Hypothyroidism   Chronic kidney disease (CKD), stage IV (severe) (HCC)   Anemia   Elevated troponin  #1. Hypoxia. May be multifactorial. Major player likely acute heart failure in the setting of worsening renal function and anemia. Less likely infectious process. Chest x-ray with  bilateral diffuse interstitial and patchy lung lipase of these concerning for edema or inflammation. Oxygen saturation level drops to 88% with conversation. No leukocytosis no fever. BNP pending. EKG reveals sinus rhythm with occasional PVC and prolonged QT no change from previous tracings. Initial troponin 0.22. Echo done in 2012 revealed ejection fraction of 60% normal wall motion grade 1 diastolic dysfunction -Admit to telemetry -Continue IV Lasix -Obtain 2-D echo -cycle troponin -nebulizer -Monitor intake and output -Obtain daily weights -Check influenza panel -Anemia panel -FOBT -Obtain strep pneumo urine antigen -Sputum culture if able -Follow blood cultures -Hold antibiotics for now -Monitor oxygen saturation level -Oxygen supplementation as indicated  #2. Chronic kidney disease stage IV. Creatinine 4.56 on admission. Chart review indicates 11 months ago creatinine 3.56 and one year prior to that 2.35. I believe current creatinine is likely close to new baseline setting of #1. Left brachial AV fistula placed in February. He reports being on transplant list has not started dialysis -Lasix as noted above -Hold any other nephrotoxins -Monitor urine output -If no improvement nephrology consult  #3. CAD. Status post stent 2008. She denies any chest pain. Initial troponin 0.22. EKG without acute changes. Home medications include low-dose aspirin, Procardia, statin -cycle troponin -Monitor on telemetry -Continue home meds  #4. Anemia. History of same likely related to chronic disease. Hemoglobin 8.9 on  admission. Chart review indicates her hemoglobin ranges between 10.5 and 8.9. She had colonoscopy 2016 which showed polyps. -Anemia panel -FOBT -Monitor  #5. AAA status post graft repair several years ago. -Stable at baseline  #6. Hypothyroidism. -We'll obtain a TSH -Continue home medications  #7. Elevated troponin. No CP. EKG with no acute changes. Likely related to kidney function -will cycle -monitor   Code Status: full DVT Prophylaxis: Family Communication: daughter at bedside Disposition Plan: home hopefully tomorrow  Time spent: 61 minutes  [facility]

## 2015-08-01 NOTE — Progress Notes (Signed)
Patient arrived from the ED on a stretcher, vital signs obtained, assessment competed see flowsheet, patient oriented to room and staff, placed on tele ccmd notified, call light within reach , bed in lowest position, will continue to monitor. Oren Beckmann, RN

## 2015-08-01 NOTE — ED Notes (Signed)
Pt reports that she was seen on Monday by her PCP and diagnosed with a sinus infection and given antibiotics. Pt reports since then she has had an increase in SOB and chest congestion with a productive cough. Pt alert x4. NAD at this time.

## 2015-08-02 ENCOUNTER — Encounter (HOSPITAL_COMMUNITY): Payer: Self-pay | Admitting: General Practice

## 2015-08-02 ENCOUNTER — Observation Stay (HOSPITAL_COMMUNITY): Payer: Medicare HMO

## 2015-08-02 DIAGNOSIS — R0902 Hypoxemia: Secondary | ICD-10-CM | POA: Diagnosis not present

## 2015-08-02 DIAGNOSIS — I132 Hypertensive heart and chronic kidney disease with heart failure and with stage 5 chronic kidney disease, or end stage renal disease: Secondary | ICD-10-CM | POA: Diagnosis present

## 2015-08-02 DIAGNOSIS — R079 Chest pain, unspecified: Secondary | ICD-10-CM | POA: Diagnosis not present

## 2015-08-02 DIAGNOSIS — D473 Essential (hemorrhagic) thrombocythemia: Secondary | ICD-10-CM | POA: Diagnosis not present

## 2015-08-02 DIAGNOSIS — N184 Chronic kidney disease, stage 4 (severe): Secondary | ICD-10-CM | POA: Diagnosis not present

## 2015-08-02 DIAGNOSIS — I2510 Atherosclerotic heart disease of native coronary artery without angina pectoris: Secondary | ICD-10-CM | POA: Diagnosis not present

## 2015-08-02 DIAGNOSIS — J189 Pneumonia, unspecified organism: Secondary | ICD-10-CM | POA: Diagnosis present

## 2015-08-02 DIAGNOSIS — D7589 Other specified diseases of blood and blood-forming organs: Secondary | ICD-10-CM | POA: Diagnosis not present

## 2015-08-02 DIAGNOSIS — D649 Anemia, unspecified: Secondary | ICD-10-CM | POA: Diagnosis not present

## 2015-08-02 DIAGNOSIS — I5021 Acute systolic (congestive) heart failure: Secondary | ICD-10-CM | POA: Diagnosis not present

## 2015-08-02 DIAGNOSIS — Z803 Family history of malignant neoplasm of breast: Secondary | ICD-10-CM | POA: Diagnosis not present

## 2015-08-02 DIAGNOSIS — Z8249 Family history of ischemic heart disease and other diseases of the circulatory system: Secondary | ICD-10-CM | POA: Diagnosis not present

## 2015-08-02 DIAGNOSIS — I272 Other secondary pulmonary hypertension: Secondary | ICD-10-CM | POA: Diagnosis present

## 2015-08-02 DIAGNOSIS — J9601 Acute respiratory failure with hypoxia: Secondary | ICD-10-CM | POA: Diagnosis present

## 2015-08-02 DIAGNOSIS — Z955 Presence of coronary angioplasty implant and graft: Secondary | ICD-10-CM | POA: Diagnosis not present

## 2015-08-02 DIAGNOSIS — I2583 Coronary atherosclerosis due to lipid rich plaque: Secondary | ICD-10-CM

## 2015-08-02 DIAGNOSIS — E876 Hypokalemia: Secondary | ICD-10-CM | POA: Diagnosis not present

## 2015-08-02 DIAGNOSIS — Z87891 Personal history of nicotine dependence: Secondary | ICD-10-CM | POA: Diagnosis not present

## 2015-08-02 DIAGNOSIS — J810 Acute pulmonary edema: Secondary | ICD-10-CM | POA: Diagnosis not present

## 2015-08-02 DIAGNOSIS — J849 Interstitial pulmonary disease, unspecified: Secondary | ICD-10-CM | POA: Diagnosis not present

## 2015-08-02 DIAGNOSIS — E785 Hyperlipidemia, unspecified: Secondary | ICD-10-CM | POA: Diagnosis present

## 2015-08-02 DIAGNOSIS — N179 Acute kidney failure, unspecified: Secondary | ICD-10-CM | POA: Diagnosis present

## 2015-08-02 DIAGNOSIS — Z9049 Acquired absence of other specified parts of digestive tract: Secondary | ICD-10-CM | POA: Diagnosis not present

## 2015-08-02 DIAGNOSIS — D860 Sarcoidosis of lung: Secondary | ICD-10-CM | POA: Diagnosis present

## 2015-08-02 DIAGNOSIS — R7989 Other specified abnormal findings of blood chemistry: Secondary | ICD-10-CM | POA: Diagnosis not present

## 2015-08-02 DIAGNOSIS — Z7982 Long term (current) use of aspirin: Secondary | ICD-10-CM | POA: Diagnosis not present

## 2015-08-02 DIAGNOSIS — R0602 Shortness of breath: Secondary | ICD-10-CM | POA: Diagnosis present

## 2015-08-02 DIAGNOSIS — D638 Anemia in other chronic diseases classified elsewhere: Secondary | ICD-10-CM | POA: Diagnosis present

## 2015-08-02 DIAGNOSIS — Z801 Family history of malignant neoplasm of trachea, bronchus and lung: Secondary | ICD-10-CM | POA: Diagnosis not present

## 2015-08-02 DIAGNOSIS — R0600 Dyspnea, unspecified: Secondary | ICD-10-CM

## 2015-08-02 DIAGNOSIS — I5043 Acute on chronic combined systolic (congestive) and diastolic (congestive) heart failure: Secondary | ICD-10-CM | POA: Diagnosis present

## 2015-08-02 DIAGNOSIS — D509 Iron deficiency anemia, unspecified: Secondary | ICD-10-CM | POA: Diagnosis present

## 2015-08-02 DIAGNOSIS — J9811 Atelectasis: Secondary | ICD-10-CM | POA: Diagnosis present

## 2015-08-02 DIAGNOSIS — N185 Chronic kidney disease, stage 5: Secondary | ICD-10-CM | POA: Diagnosis present

## 2015-08-02 DIAGNOSIS — I252 Old myocardial infarction: Secondary | ICD-10-CM | POA: Diagnosis not present

## 2015-08-02 DIAGNOSIS — B348 Other viral infections of unspecified site: Secondary | ICD-10-CM | POA: Diagnosis present

## 2015-08-02 DIAGNOSIS — I248 Other forms of acute ischemic heart disease: Secondary | ICD-10-CM | POA: Diagnosis present

## 2015-08-02 DIAGNOSIS — E871 Hypo-osmolality and hyponatremia: Secondary | ICD-10-CM | POA: Diagnosis not present

## 2015-08-02 DIAGNOSIS — Z8049 Family history of malignant neoplasm of other genital organs: Secondary | ICD-10-CM | POA: Diagnosis not present

## 2015-08-02 DIAGNOSIS — E039 Hypothyroidism, unspecified: Secondary | ICD-10-CM | POA: Diagnosis present

## 2015-08-02 DIAGNOSIS — Z79899 Other long term (current) drug therapy: Secondary | ICD-10-CM | POA: Diagnosis not present

## 2015-08-02 DIAGNOSIS — R918 Other nonspecific abnormal finding of lung field: Secondary | ICD-10-CM | POA: Diagnosis not present

## 2015-08-02 LAB — EXPECTORATED SPUTUM ASSESSMENT W REFEX TO RESP CULTURE

## 2015-08-02 LAB — TROPONIN I: TROPONIN I: 0.18 ng/mL — AB (ref <0.031)

## 2015-08-02 LAB — ECHOCARDIOGRAM COMPLETE: Weight: 2155.22 oz

## 2015-08-02 LAB — EXPECTORATED SPUTUM ASSESSMENT W GRAM STAIN, RFLX TO RESP C

## 2015-08-02 LAB — STREP PNEUMONIAE URINARY ANTIGEN: STREP PNEUMO URINARY ANTIGEN: NEGATIVE

## 2015-08-02 NOTE — Progress Notes (Signed)
[facility] PROGRESS NOTE  [name] [id number] DOB: [birth year] DOA: 08/01/2015  PCP: Sherrie Mustache, MD  Brief HPI: 81 year old Caucasian female with a past medical history of coronary artery disease, hypertension, hyperlipidemia, hypothyroidism, anemia, presented with complaints of shortness of breath. She was recently seen by her PCP a few days ago and was started on antibiotics for a sinus infection. Patient's symptoms worsened and so she presented to the emergency department and was hospitalized for further management.  Past medical history:  Past Medical History  Diagnosis Date  . CAD (coronary artery disease)     50% proximal LAD stenosis, 50% marginal stenosis of the circumflex, RCA 95% stenosis, EF 40% stenosis with hypokinesis of the interior wall.  She had a Taxus stent placed in 04/16/2006  . Hypertension   . Abdominal aortic aneurysm ([place])   . Renal artery stenosis (HCC)     S/P stenting of the right renal atrophic left renal  . Dyslipidemia   . Hypothyroidism   . Tobacco user     Previous  . Adenomatous polyp 2004, 2011  . Hyperplastic colon polyp 2011  . Hemorrhoids   . Diverticulosis   . Renal insufficiency   . Anemia   . Myocardial infarction ([place])     stent 2008  . Constipation   . Cataract   . AAA (abdominal aortic aneurysm) ([place])     Consultants: None  Procedures: None  Antibiotics: Ceftriaxone and azithromycin  Subjective: Patient feels well. Feels better compared to yesterday. Still has a cough. Has a brownish expectoration. Denies any shortness of breath or chest pains currently. No nausea or vomiting.  Objective:  Vital Signs  Filed Vitals:   08/01/15 1630 08/01/15 1635 08/01/15 1706 08/02/15 0338  BP: 175/97   152/82  Pulse: 101   78  Temp: 100.6 F (38.1 C)  102.1 F (38.9 C) 98.2 F (36.8 C)  TempSrc: Oral  Oral Oral  Resp: 20   18  Weight:    61.1 kg (134 lb 11.2 oz)  SpO2: 92% 95%  93%    Intake/Output  Summary (Last 24 hours) at 08/02/15 1428 Last data filed at 08/02/15 1235  Gross per 24 hour  Intake    360 ml  Output    150 ml  Net    210 ml   Filed Weights   08/02/15 0338  Weight: 61.1 kg (134 lb 11.2 oz)    General appearance: alert, cooperative, appears stated age and no distress Resp: Coarse breath sounds bilaterally. Crackles bilateral bases. No rhonchi or wheezing. Cardio: regular rate and rhythm, S1, S2 normal, no murmur, click, rub or gallop GI: soft, non-tender; bowel sounds normal; no masses,  no organomegaly Extremities: extremities normal, atraumatic, no cyanosis or edema Neurologic: Awake and alert. Oriented 3. No focal neurological deficits.  Lab Results:  Data Reviewed: I have personally reviewed following labs and imaging studies  CBC:  Recent Labs Lab 08/01/15 1231  WBC 11.1*  HGB 8.2*  HCT 24.8*  MCV 93.6  PLT [id number]   Basic Metabolic Panel:  Recent Labs Lab 08/01/15 1231  NA 137  K 3.5  CL 104  CO2 20*  GLUCOSE 106*  BUN 35*  CREATININE 4.56*  CALCIUM 7.6*   GFR: Estimated Creatinine Clearance: 9.1 mL/min (by C-G formula based on Cr of 4.56).  Cardiac Enzymes:  Recent Labs Lab 08/01/15 2241 08/02/15 0839  TROPONINI 0.28* 0.18*   Thyroid Function Tests:  Recent Labs  08/01/15 1438  TSH  4.418   Anemia Panel:  Recent Labs  08/01/15 1500  VITAMINB12 2631*  FOLATE 20.4  FERRITIN 52  TIBC 273  IRON 14*  RETICCTPCT 1.8    Recent Results (from the past 240 hour(s))  Culture, blood (routine x 2) Call MD if unable to obtain prior to antibiotics being given     Status: None (Preliminary result)   Collection Time: 08/01/15  2:52 PM  Result Value Ref Range Status   Specimen Description BLOOD RIGHT HAND  Final   Special Requests IN PEDIATRIC BOTTLE 1CC  Final   Culture NO GROWTH < 24 HOURS  Final   Report Status PENDING  Incomplete  Culture, blood (routine x 2) Call MD if unable to obtain prior to antibiotics being given      Status: None (Preliminary result)   Collection Time: 08/01/15  2:57 PM  Result Value Ref Range Status   Specimen Description BLOOD RIGHT ANTECUBITAL  Final   Special Requests IN PEDIATRIC BOTTLE 1CC  Final   Culture NO GROWTH < 24 HOURS  Final   Report Status PENDING  Incomplete  Culture, sputum-assessment     Status: None   Collection Time: 08/02/15  5:27 AM  Result Value Ref Range Status   Specimen Description EXPECTORATED SPUTUM  Final   Special Requests NONE  Final   Sputum evaluation   Final    THIS SPECIMEN IS ACCEPTABLE. RESPIRATORY CULTURE REPORT TO FOLLOW.   Report Status 08/02/2015 FINAL  Final      Radiology Studies: Dg Chest 2 View  08/01/2015  CLINICAL DATA:  Shortness of breath. EXAM: CHEST  2 VIEW COMPARISON:  December 22, 2012. FINDINGS: Stable cardiomegaly. No pneumothorax or pleural effusion is noted. Atherosclerosis of thoracic aorta is noted. Diffuse interstitial and patchy lung opacities are noted bilaterally most consistent with edema or less likely inflammation. Bony thorax is unremarkable. IMPRESSION: Bilateral diffuse interstitial and patchy lung opacities are noted concerning for edema or less likely inflammation. Electronically Signed   By: Marijo Conception, M.D.   On: 08/01/2015 13:05     Medications:  Scheduled: . azithromycin  500 mg Intravenous Q24H  . cefTRIAXone (ROCEPHIN)  IV  1 g Intravenous Q24H  . citalopram  20 mg Oral Daily  . furosemide  40 mg Intravenous BID  . heparin  5,000 Units Subcutaneous Q8H  . levothyroxine  50 mcg Oral QAC breakfast  . linaclotide  145 mcg Oral Daily  . NIFEdipine  60 mg Oral Daily  . pantoprazole  40 mg Oral Daily  . pravastatin  80 mg Oral QHS  . sodium chloride flush  3 mL Intravenous Q12H  . sodium chloride flush  3 mL Intravenous Q12H   Continuous:  [id number] chloride, acetaminophen, bisacodyl, fluticasone, meclizine, ondansetron **OR** ondansetron (ZOFRAN) IV, sodium chloride flush, traMADol,  traZODone  Assessment/Plan:  Principal Problem:   Hypoxia Active Problems:   Coronary atherosclerosis   AAA (abdominal aortic aneurysm) (HCC)   Hypothyroidism   Chronic kidney disease (CKD), stage IV (severe) (HCC)   Anemia   Elevated troponin   Congestive heart failure (HCC)    Dyspnea, cough and fever/possible community-acquired pneumonia Her presentation could be multifactorial. However, patient was febrile yesterday evening. This points more towards an infectious process such as pneumonia. However, fluid overload is also a possibility. However, her weight has been stable compared to the past few months. Continue with IV antibiotics for now. Patient is feeling better. I would also continue intravenous Lasix for now. Wait  for clinical improvement. Await cultures. Strep antigen in the urine was negative. Influenza PCR was negative.  Chronic kidney disease stage IV Patient is followed by Dr. Florene Glen with [place] [facility]. Creatinine was 4.56 and admission. Back in May 2016, it was 3.56. She recently had a left AV fistula placed in preparation for dialysis, although she is not on dialysis yet. Apparently she is on a transplant list. Repeat renal function testing tomorrow morning. Monitor urine output.  History of coronary artery disease. She is status post intervention in 2008. She denies any chest pain currently. Minimal elevation in troponin is low, most likely due to demand ischemia and renal failure. Continue to monitor clinically. Continue home medications. Do not anticipate any testing during this hospitalization.  Normocytic anemia. Likely due to chronic disease. Monitor for now.  History of AAA, status post graft repair several years ago. Stable.  History of hypothyroidism. Continue home medications  DVT Prophylaxis: Subcutaneous heparin    Code Status: Full code  Family Communication: Discussed with the patient and her daughter Disposition Plan: Continue current treatment  for now. Mobilize as tolerated.      [facility] Pager [phone] 08/02/2015, 2:28 PM  If 7PM-7AM, please contact night-coverage at [email or web address], password [email or web address]

## 2015-08-02 NOTE — Progress Notes (Signed)
Utilization review completed.  

## 2015-08-02 NOTE — Progress Notes (Signed)
Echocardiogram 2D Echocardiogram has been performed.  Tresa Res 08/02/2015, 3:00 PM

## 2015-08-02 NOTE — Care Management Obs Status (Signed)
[facility] NOTIFICATION   Patient Details  Name: [name] MRN: [id number] Date of Birth: Apr 08, 1934   Medicare Observation Status Notification Given:  Yes    Dawayne Patricia, RN 08/02/2015, 12:00 PM

## 2015-08-03 ENCOUNTER — Inpatient Hospital Stay (HOSPITAL_COMMUNITY): Payer: Medicare HMO

## 2015-08-03 DIAGNOSIS — N184 Chronic kidney disease, stage 4 (severe): Secondary | ICD-10-CM

## 2015-08-03 DIAGNOSIS — I5021 Acute systolic (congestive) heart failure: Secondary | ICD-10-CM | POA: Diagnosis present

## 2015-08-03 DIAGNOSIS — N179 Acute kidney failure, unspecified: Secondary | ICD-10-CM | POA: Diagnosis present

## 2015-08-03 LAB — CBC
HEMATOCRIT: 22.9 % — AB (ref 36.0–46.0)
HEMOGLOBIN: 7.4 g/dL — AB (ref 12.0–15.0)
MCH: 29.6 pg (ref 26.0–34.0)
MCHC: 32.3 g/dL (ref 30.0–36.0)
MCV: 91.6 fL (ref 78.0–100.0)
Platelets: 307 10*3/uL (ref 150–400)
RBC: 2.5 MIL/uL — ABNORMAL LOW (ref 3.87–5.11)
RDW: 13.7 % (ref 11.5–15.5)
WBC: 9.9 10*3/uL (ref 4.0–10.5)

## 2015-08-03 LAB — BASIC METABOLIC PANEL
ANION GAP: 14 (ref 5–15)
BUN: 34 mg/dL — ABNORMAL HIGH (ref 6–20)
CALCIUM: 7.4 mg/dL — AB (ref 8.9–10.3)
CHLORIDE: 103 mmol/L (ref 101–111)
CO2: 22 mmol/L (ref 22–32)
CREATININE: 5.05 mg/dL — AB (ref 0.44–1.00)
GFR calc Af Amer: 9 mL/min — ABNORMAL LOW (ref >60)
GFR calc non Af Amer: 7 mL/min — ABNORMAL LOW (ref >60)
GLUCOSE: 118 mg/dL — AB (ref 65–99)
Potassium: 3 mmol/L — ABNORMAL LOW (ref 3.5–5.1)
Sodium: 139 mmol/L (ref 135–145)

## 2015-08-03 LAB — PROCALCITONIN: Procalcitonin: 0.49 ng/mL

## 2015-08-03 LAB — PREPARE RBC (CROSSMATCH)

## 2015-08-03 MED ORDER — SODIUM CHLORIDE 0.9 % IV SOLN
Freq: Once | INTRAVENOUS | Status: AC
  Administered 2015-08-03: 17:00:00 via INTRAVENOUS

## 2015-08-03 MED ORDER — FUROSEMIDE 10 MG/ML IJ SOLN
80.0000 mg | Freq: Two times a day (BID) | INTRAMUSCULAR | Status: DC
  Administered 2015-08-03 – 2015-08-07 (×9): 80 mg via INTRAVENOUS
  Filled 2015-08-03 (×10): qty 8

## 2015-08-03 MED ORDER — LOPERAMIDE HCL 2 MG PO CAPS
2.0000 mg | ORAL_CAPSULE | Freq: Once | ORAL | Status: AC
  Administered 2015-08-03: 2 mg via ORAL
  Filled 2015-08-03: qty 1

## 2015-08-03 MED ORDER — FUROSEMIDE 10 MG/ML IJ SOLN
40.0000 mg | Freq: Once | INTRAMUSCULAR | Status: AC
  Administered 2015-08-03: 40 mg via INTRAVENOUS
  Filled 2015-08-03: qty 4

## 2015-08-03 MED ORDER — SACCHAROMYCES BOULARDII 250 MG PO CAPS
250.0000 mg | ORAL_CAPSULE | Freq: Two times a day (BID) | ORAL | Status: DC
  Administered 2015-08-03 – 2015-08-16 (×26): 250 mg via ORAL
  Filled 2015-08-03 (×26): qty 1

## 2015-08-03 NOTE — Clinical Documentation Improvement (Signed)
Hospitalist Please update your documentation within the medical record to reflect your response to this query. Thank you  Can the diagnosis of CHF be further specified?    Acuity - Acute, Chronic, Acute on Chronic   Type - Systolic, Diastolic, Systolic and Diastolic  Other  Clinically Undetermined  Document any associated diagnoses/conditions  Supporting Information: 08/03/15 progr note.Marland KitchenMarland Kitchen"Congestive heart failure ([place])"..."her weight has been stable compared to the past few months. Continue with IV antibiotics for now. Patient is feeling better. I would also continue intravenous Lasix for now. Wait for clinical improvement."..  Please exercise your independent, professional judgment when responding. A specific answer is not anticipated or expected.  Thank You,  Ermelinda Das, RN, BSN, [facility] Certified Clinical Documentation Specialist [place]: Health Information Management [phone]

## 2015-08-03 NOTE — Consult Note (Signed)
[name] Admit Date: 08/01/2015 08/03/2015 Rexene Agent Requesting Physician:  Particia Jasper  Reason for Consult:  AoCKD HPI:  81 year old female admitted on 5/17 with shortness of breath and hypoxia. She has a history of CKD 4 with a creatinine ranging from 3.6-3.9 followed by Dr. Florene Glen at our office. Etiology is hypertensive and atherosclerotic in nature with known renal artery stenosis and left renal atrophy. She does not use Ace inhibitors or ARB's. Home furosemide dosing as 40 mg daily. With progressive CKD she has received a left arm AV fistula with Dr. Kellie Simmering approximate 2-3 months ago. She presented with dyspnea and hypoxia with chest x-ray demonstrating bilateral infiltrates consistent with edema. Given some productive cough and subjective fevers she was placed on ceftriaxone and azithromycin for community-acquired pneumonia. TTE has demonstrated an LVEF of 45-50% with mild LVH and grade 1 diastolic dysfunction. She was placed on Lasix 40 mg IV twice daily with some increased urine output though not all was strictly recorded. Her breathing has improved but she remains dyspneic and hypoxic when ambulating.  With diuresis and treatment her creatinine has increased to 5.05. Presenting was 4.56. Previous value before that was 3.9 on 03/2015. No Foley catheter in place. No exposure to nonsteroidals or contrast agents. She is lying flat in the bed on room air on my exam. Diuretics have been temporarily held. No further fevers in the past 24 hours. It does not appear she is markedly above her previous weights over the past year.  Anemia is much worse upon presentation. Hemoglobin is 8.2 upon presentation and 7.4 today.  CREATININE, SER  Date Value  08/03/2015 5.05 mg/dL*  08/01/2015 4.56 mg/dL*  08/14/2014 3.56 mg/dL*  12/23/2012 2.35 mg/dL*  12/22/2012 2.19 mg/dL*  12/22/2012 2.11 mg/dL*  12/22/2012 2.31 mg/dL*  12/21/2012 2.65 mg/dL*  11/30/2012 2.90 mg/dL*  10/11/2006 3.21*   ] I/Os: I/O last 3 completed shifts: In: 26 [P.O.:480] Out: 1000 [Urine:1000]  ROS Balance of 12 systems is negative w/ exceptions as above  PMH  Past Medical History  Diagnosis Date  . CAD (coronary artery disease)     50% proximal LAD stenosis, 50% marginal stenosis of the circumflex, RCA 95% stenosis, EF 40% stenosis with hypokinesis of the interior wall.  She had a Taxus stent placed in 04/16/2006  . Hypertension   . Abdominal aortic aneurysm ([place])   . Renal artery stenosis (HCC)     S/P stenting of the right renal atrophic left renal  . Dyslipidemia   . Hypothyroidism   . Tobacco user     Previous  . Adenomatous polyp 2004, 2011  . Hyperplastic colon polyp 2011  . Hemorrhoids   . Diverticulosis   . Renal insufficiency   . Anemia   . Myocardial infarction ([place])     stent 2008  . Constipation   . Cataract   . AAA (abdominal aortic aneurysm) (HCC)    PSH  Past Surgical History  Procedure Laterality Date  . Abdominal hysterectomy    . Carpal tunnel release Bilateral   . Cholecystectomy    . Flexible sigmoidoscopy  02/25/2012    Procedure: FLEXIBLE SIGMOIDOSCOPY;  Surgeon: Inda Castle, MD;  Location: [facility];  Service: Endoscopy;  Laterality: N/A;  . Hemorrhoid banding  02/25/2012    Procedure: HEMORRHOID BANDING;  Surgeon: Inda Castle, MD;  Location: [facility];  Service: Endoscopy;  Laterality: N/A;  . Abdominal angiogram  12/21/2012    AAA  . Abdominal aortic endovascular stent graft N/A  12/22/2012    Procedure: ABDOMINAL AORTIC ENDOVASCULAR STENT GRAFT- GORE;  Surgeon: Pryor OchoaJames D Lawson, MD;  [facility];  Service: Vascular;  Laterality: N/A;  Ultrasound guided  . Abdominal angiogram N/A 11/30/2012    Procedure: ABDOMINAL ANGIOGRAM;  Surgeon: Nada LibmanVance W Brabham, MD;  [facility];  Service: Cardiovascular;  Laterality: N/A;  . Cataract extraction, bilateral    . Colonoscopy    . Polypectomy    . Cardiac catheterization  2008  . Av  fistula placement Left 04/20/2015    Procedure: CREATION OF LEFT UPPER ARM BRACHIOCEPHALIC ARTERIOVENOUS (AV) FISTULA  ;  Surgeon: Pryor OchoaJames D Lawson, MD;  [facility];  Service: Vascular;  Laterality: Left;   FH  Family History  Problem Relation Age of Onset  . Uterine cancer Mother   . Heart attack Father [id number]  . Heart disease Father     before age 760  . Peripheral vascular disease Sister   . Other Sister     Renal artery stenosis  . Heart disease Sister     before age 79  . Heart attack Brother 42  . Heart disease Brother     before age 79  . Lung cancer Brother     \  . Colon cancer Neg Hx   . Breast cancer Maternal Aunt     x2   SH  reports that she quit smoking about 9 years ago. Her smoking use included Cigarettes. She quit after 1 year of use. She has never used smokeless tobacco. She reports that she does not drink alcohol or use illicit drugs. Allergies  Allergies  Allergen Reactions  . Codeine Rash  . Sulfonamide Derivatives Hives   Home medications Prior to Admission medications   Medication Sig Start Date End Date Taking? Authorizing Provider  acetaminophen (TYLENOL) 325 MG tablet Take 162.5-325 mg by mouth every 6 (six) hours as needed (For pain.).    Yes Historical Provider, MD  aspirin EC 81 MG tablet Take 81 mg by mouth daily.   Yes Historical Provider, MD  citalopram (CELEXA) 20 MG tablet Take 20 mg by mouth daily.  12/16/10  Yes Historical Provider, MD  cyanocobalamin (,VITAMIN B-12,) 1000 MCG/ML injection Inject 1,000 mcg into the muscle every 30 (thirty) days.    Yes Historical Provider, MD  fluticasone (FLONASE) 50 MCG/ACT nasal spray Place 1 spray into both nostrils daily as needed for allergies.    Yes Historical Provider, MD  furosemide (LASIX) 40 MG tablet Take 1 tablet (40 mg total) by mouth daily. *MUST MAKE APPOINTMENT* 07/28/14  Yes Rollene RotundaJames Hochrein, MD  hydrocortisone (ANUSOL-HC) 25 MG suppository Place 25 mg rectally at bedtime as needed for  hemorrhoids.    Yes Historical Provider, MD  levothyroxine (SYNTHROID, LEVOTHROID) 50 MCG tablet Take 50 mcg by mouth daily.    Yes Historical Provider, MD  Linaclotide Karlene Einstein(LINZESS) 145 MCG CAPS capsule Take 1 capsule (145 mcg total) by mouth daily. 06/07/15  Yes Rachael Feeaniel P Jacobs, MD  meclizine (ANTIVERT) 25 MG tablet Take 1 tablet (25 mg total) by mouth 3 (three) times daily as needed for dizziness. 08/14/14  Yes Geoffery Lyonsouglas Delo, MD  Multiple Vitamins-Minerals (OCUVITE PRESERVISION PO) Take 1 tablet by mouth daily.   Yes Historical Provider, MD  NIFEdipine (PROCARDIA XL/ADALAT-CC) 60 MG 24 hr tablet TAKE 1 TABLET (60 MG TOTAL) BY MOUTH DAILY. PATIENT NEEDS TO CONTACT OFFICE FOR ADDITIONAL REFILLS 07/12/15  Yes Rollene RotundaJames Hochrein, MD  nitroGLYCERIN (NITROSTAT) 0.4 MG SL tablet Place 0.4  mg under the tongue every 5 (five) minutes as needed for chest pain.   Yes Historical Provider, MD  omeprazole (PRILOSEC) 20 MG capsule Take 20 mg by mouth daily as needed (For heartburn or acid reflux.).  07/13/13  Yes Historical Provider, MD  Oxymetazoline HCl (NASAL DECONGESTANT SPRAY NA) Place 1 spray into the nose daily as needed (For congestion.).  06/22/14  Yes Historical Provider, MD  pravastatin (PRAVACHOL) 80 MG tablet Take 80 mg by mouth at bedtime.  03/29/12  Yes Historical Provider, MD  traMADol (ULTRAM) 50 MG tablet Take 1 tablet (50 mg total) by mouth every 6 (six) hours as needed. Patient taking differently: Take 50 mg by mouth every 6 (six) hours as needed for moderate pain.  04/20/15  Yes Alvia Grove, PA-C    Current Medications Scheduled Meds: . azithromycin  500 mg Intravenous Q24H  . cefTRIAXone (ROCEPHIN)  IV  1 g Intravenous Q24H  . citalopram  20 mg Oral Daily  . heparin  5,000 Units Subcutaneous Q8H  . levothyroxine  50 mcg Oral QAC breakfast  . linaclotide  145 mcg Oral Daily  . NIFEdipine  60 mg Oral Daily  . pantoprazole  40 mg Oral Daily  . pravastatin  80 mg Oral QHS  . saccharomyces boulardii   250 mg Oral BID  . sodium chloride flush  3 mL Intravenous Q12H  . sodium chloride flush  3 mL Intravenous Q12H   Continuous Infusions:  PRN Meds:.sodium chloride, acetaminophen, bisacodyl, fluticasone, meclizine, ondansetron **OR** ondansetron (ZOFRAN) IV, sodium chloride flush, traMADol, traZODone  CBC  Recent Labs Lab 08/01/15 1231 08/03/15 0530  WBC 11.1* 9.9  HGB 8.2* 7.4*  HCT 24.8* 22.9*  MCV 93.6 91.6  PLT 300 [id number]   Basic Metabolic Panel  Recent Labs Lab 08/01/15 1231 08/03/15 0530  NA 137 139  K 3.5 3.0*  CL 104 103  CO2 20* 22  GLUCOSE 106* 118*  BUN 35* 34*  CREATININE 4.56* 5.05*  CALCIUM 7.6* 7.4*    Physical Exam  Blood pressure 116/56, pulse 89, temperature 98.6 F (37 C), temperature source Oral, resp. rate 18, weight 60.328 kg (133 lb), SpO2 92 %. GEN: NAD ENT: NCAT EYES: EOMI CV: RRR PULM: diminished in bases ABD: s/nt/nd SKIN: no rashes/lesions [id number] edema LUE AVF +B/T  Assessment 44F with AoCKD5 presenting with SOB, b/l pulm infiltrates, CAP and/or dCHF exacerbation  1. AoCKD5 2. Hypoxia with CAP and/or CHF excacerbation with #4 contributing 3. LUE AVF, mature 4. Anemia, Hb 7.4; hypoproliferative; evidence of Fe deficiency  Plan 1. Seems this is quite multifactorial. Component of pneumonia, CHF present as well as anemia. 2. Suggest transfusion of 2u PRBC 3. Continue antibiotics 4. If creatinine continues to worsen would likely need dialysis, have discussed with patient. 5. Resume furosemide 80mg  IV BID   Pearson Grippe MD [phone] pgr 08/03/2015, 4:00 PM     rd

## 2015-08-03 NOTE — Progress Notes (Signed)
[facility] PROGRESS NOTE  [name] [id number] DOB: 1934-04-15 DOA: 08/01/2015  PCP: Sherrie Mustache, MD  Brief HPI: 81 year old Caucasian female with a past medical history of coronary artery disease, hypertension, hyperlipidemia, hypothyroidism, anemia, presented with complaints of shortness of breath. She was recently seen by her PCP a few days ago and was started on antibiotics for a sinus infection. Patient's symptoms worsened and so she presented to the emergency department and was hospitalized for further management.  Past medical history:  Past Medical History  Diagnosis Date  . CAD (coronary artery disease)     50% proximal LAD stenosis, 50% marginal stenosis of the circumflex, RCA 95% stenosis, EF 40% stenosis with hypokinesis of the interior wall.  She had a Taxus stent placed in 04/16/2006  . Hypertension   . Abdominal aortic aneurysm ([place])   . Renal artery stenosis (HCC)     S/P stenting of the right renal atrophic left renal  . Dyslipidemia   . Hypothyroidism   . Tobacco user     Previous  . Adenomatous polyp 2004, 2011  . Hyperplastic colon polyp 2011  . Hemorrhoids   . Diverticulosis   . Renal insufficiency   . Anemia   . Myocardial infarction ([place])     stent 2008  . Constipation   . Cataract   . AAA (abdominal aortic aneurysm) ([place])     Consultants: None  Procedures:  Transthoracic echocardiogram Study Conclusions - Left ventricle: The cavity size was normal. Wall thickness was increased in a pattern of mild LVH. Systolic function was mildly reduced. The estimated ejection fraction was in the range of 45% to 50%. Doppler parameters are consistent with abnormal left ventricular relaxation (grade 1 diastolic dysfunction). - Right atrium: The atrium was moderately dilated. - Pulmonary arteries: Systolic pressure was moderately increased. PA peak pressure: 41 mm Hg (S).   Antibiotics: Ceftriaxone and azithromycin  Subjective: Patient  still getting dyspneic with exertion. She denies any chest pain. Cough continues with brownish expectoration. Denies any blood in the sputum. Overall, she feels slightly better, however, not quite back to her baseline.   Objective:  Vital Signs  Filed Vitals:   08/02/15 1900 08/02/15 2046 08/03/15 0421 08/03/15 1113  BP:  128/71 124/70 131/68  Pulse:  84 76 78  Temp:  98.9 F (37.2 C) 97.9 F (36.6 C) 98.4 F (36.9 C)  TempSrc:  Oral Oral Oral  Resp:  20 18 20   Weight:   60.328 kg (133 lb)   SpO2: 96% 93% 98% 95%    Intake/Output Summary (Last 24 hours) at 08/03/15 1257 Last data filed at 08/03/15 0900  Gross per 24 hour  Intake    240 ml  Output    850 ml  Net   -610 ml   Filed Weights   08/02/15 0338 08/03/15 0421  Weight: 61.1 kg (134 lb 11.2 oz) 60.328 kg (133 lb)    General appearance: alert, cooperative, appears stated age and no distress Resp: Continues to have crackles bilaterally. No wheezing or rhonchi.  Cardio: regular rate and rhythm, S1, S2 normal, no murmur, click, rub or gallop. No pedal edema GI: soft, non-tender; bowel sounds normal; no masses,  no organomegaly Neurologic: Awake and alert. Oriented 3. No focal neurological deficits.  Lab Results:  Data Reviewed: I have personally reviewed following labs and imaging studies  CBC:  Recent Labs Lab 08/01/15 1231 08/03/15 0530  WBC 11.1* 9.9  HGB 8.2* 7.4*  HCT 24.8* 22.9*  MCV 93.6 91.6  PLT 300 [id number]   Basic Metabolic Panel:  Recent Labs Lab 08/01/15 1231 08/03/15 0530  NA 137 139  K 3.5 3.0*  CL 104 103  CO2 20* 22  GLUCOSE 106* 118*  BUN 35* 34*  CREATININE 4.56* 5.05*  CALCIUM 7.6* 7.4*   GFR: Estimated Creatinine Clearance: 8.3 mL/min (by C-G formula based on Cr of 5.05).  Cardiac Enzymes:  Recent Labs Lab 08/01/15 2241 08/02/15 0839  TROPONINI 0.28* 0.18*   Thyroid Function Tests:  Recent Labs  08/01/15 1438  TSH 4.418   Anemia Panel:  Recent Labs   08/01/15 1500  VITAMINB12 2631*  FOLATE 20.4  FERRITIN 52  TIBC 273  IRON 14*  RETICCTPCT 1.8    Recent Results (from the past 240 hour(s))  Culture, blood (routine x 2) Call MD if unable to obtain prior to antibiotics being given     Status: None (Preliminary result)   Collection Time: 08/01/15  2:52 PM  Result Value Ref Range Status   Specimen Description BLOOD RIGHT HAND  Final   Special Requests IN PEDIATRIC BOTTLE 1CC  Final   Culture NO GROWTH < 24 HOURS  Final   Report Status PENDING  Incomplete  Culture, blood (routine x 2) Call MD if unable to obtain prior to antibiotics being given     Status: None (Preliminary result)   Collection Time: 08/01/15  2:57 PM  Result Value Ref Range Status   Specimen Description BLOOD RIGHT ANTECUBITAL  Final   Special Requests IN PEDIATRIC BOTTLE 1CC  Final   Culture NO GROWTH < 24 HOURS  Final   Report Status PENDING  Incomplete  Culture, sputum-assessment     Status: None   Collection Time: 08/02/15  5:27 AM  Result Value Ref Range Status   Specimen Description EXPECTORATED SPUTUM  Final   Special Requests NONE  Final   Sputum evaluation   Final    THIS SPECIMEN IS ACCEPTABLE. RESPIRATORY CULTURE REPORT TO FOLLOW.   Report Status 08/02/2015 FINAL  Final      Radiology Studies: Dg Chest 2 View  08/03/2015  CLINICAL DATA:  Cough, dyspnea, shortness of breath for 3 days EXAM: CHEST  2 VIEW COMPARISON:  08/01/2015 FINDINGS: Cardiomediastinal silhouette is stable. Elevation of the right hemidiaphragm again noted. Again noted bilateral mild interstitial prominence and patchy perihilar airspace opacification suspicious for mild interstitial edema. Atypical pneumonitis cannot be excluded. Clinical correlation is necessary. Mild degenerative changes mid thoracic spine. IMPRESSION: Elevation of the right hemidiaphragm again noted. Again noted bilateral mild interstitial prominence and patchy perihilar airspace opacification suspicious for mild  interstitial edema. Atypical pneumonitis cannot be excluded. Clinical correlation is necessary. Electronically Signed   By: Lahoma Crocker M.D.   On: 08/03/2015 11:00     Medications:  Scheduled: . azithromycin  500 mg Intravenous Q24H  . cefTRIAXone (ROCEPHIN)  IV  1 g Intravenous Q24H  . citalopram  20 mg Oral Daily  . heparin  5,000 Units Subcutaneous Q8H  . levothyroxine  50 mcg Oral QAC breakfast  . linaclotide  145 mcg Oral Daily  . NIFEdipine  60 mg Oral Daily  . pantoprazole  40 mg Oral Daily  . pravastatin  80 mg Oral QHS  . sodium chloride flush  3 mL Intravenous Q12H  . sodium chloride flush  3 mL Intravenous Q12H   Continuous:  [id number] chloride, acetaminophen, bisacodyl, fluticasone, meclizine, ondansetron **OR** ondansetron (ZOFRAN) IV, sodium chloride flush, traMADol, traZODone  Assessment/Plan:  Principal Problem:   Hypoxia Active Problems:   Coronary atherosclerosis   AAA (abdominal aortic aneurysm) (HCC)   Hypothyroidism   Chronic kidney disease (CKD), stage IV (severe) (HCC)   CAP (community acquired pneumonia)   Anemia   Elevated troponin   Congestive heart failure (HCC)    Dyspnea, cough and fever/possible community-acquired pneumonia/Fluid overload Patient's symptoms are not improving as anticipated. X-ray was repeated this morning and shows persistent bilateral infiltrates. This is concerning for fluid overload. Infection was also considered a possibility and so she has been on antibiotics. She was getting Lasix but had a rise in her creatinine today and so it was held. I think we need assistance from nephrology at this time. Nephrology has been consulted. Continue current antibiotics for now. Await cultures. Strep antigen was negative. Influenza PCR was negative.   Systolic CHF, likely acute Echocardiogram shows reduced EF. This is new compared to previous echocardiograms. She does have a history of CAD, as discussed above. She had a negative stress test  in 2014. We will first see what nephrology has to say and then will consider cardiology input, although it's unlikely she will be able to undergo invasive procedures due to renal failure.  Acute on Chronic kidney disease stage IV Patient is followed by Dr. Florene Glen with [facility]. Creatinine was 4.56 at the time of admission. Back in May 2016, it was 3.56. She recently had a left AV fistula placed in preparation for dialysis, although she is not on dialysis yet. Apparently she is on a transplant list. Creatinine continues to get worse. Nephrology has been consulted. Monitor urine output. She may need higher doses of Lasix.  History of coronary artery disease with mildly elevated troponin She is status post intervention in 2008. She denies any chest pain currently. Minimal elevation in troponin is noted, most likely due to demand ischemia and renal failure. Continue to monitor clinically. Continue home medications. Echocardiogram does show reduction in ejection fraction. Cardiology notes reviewed. It looks like she had a stress test in 2012, which did not show any ischemia. She also had a stress test in 2014 also normal and had normal EF at that time.  Normocytic anemia. Likely due to chronic disease. Drop in hemoglobin is likely due to fluid shifts. No overt bleeding. Monitor for now.  History of AAA, status post graft repair several years ago. Stable.  History of hypothyroidism. Continue home medications  DVT Prophylaxis: Subcutaneous heparin    Code Status: Full code  Family Communication: Discussed with the patient Disposition Plan: Continue current treatment for now. Mobilize as tolerated. Await nephrology input.    LOS: 1 day   [facility] Pager [phone] 08/03/2015, 12:57 PM  If 7PM-7AM, please contact night-coverage at [email or web address], password [facility]

## 2015-08-03 NOTE — Progress Notes (Signed)
Patient unwilling to ambulate in the hall at this time. Will continue to monitor

## 2015-08-04 DIAGNOSIS — E039 Hypothyroidism, unspecified: Secondary | ICD-10-CM

## 2015-08-04 LAB — BASIC METABOLIC PANEL
ANION GAP: 14 (ref 5–15)
BUN: 33 mg/dL — ABNORMAL HIGH (ref 6–20)
CHLORIDE: 102 mmol/L (ref 101–111)
CO2: 22 mmol/L (ref 22–32)
CREATININE: 5.09 mg/dL — AB (ref 0.44–1.00)
Calcium: 7.2 mg/dL — ABNORMAL LOW (ref 8.9–10.3)
GFR calc non Af Amer: 7 mL/min — ABNORMAL LOW (ref >60)
GFR, EST AFRICAN AMERICAN: 9 mL/min — AB (ref >60)
Glucose, Bld: 104 mg/dL — ABNORMAL HIGH (ref 65–99)
POTASSIUM: 2.9 mmol/L — AB (ref 3.5–5.1)
Sodium: 138 mmol/L (ref 135–145)

## 2015-08-04 LAB — CBC
HEMATOCRIT: 26.9 % — AB (ref 36.0–46.0)
HEMOGLOBIN: 8.5 g/dL — AB (ref 12.0–15.0)
MCH: 28.2 pg (ref 26.0–34.0)
MCHC: 31.6 g/dL (ref 30.0–36.0)
MCV: 89.4 fL (ref 78.0–100.0)
Platelets: 293 10*3/uL (ref 150–400)
RBC: 3.01 MIL/uL — ABNORMAL LOW (ref 3.87–5.11)
RDW: 15 % (ref 11.5–15.5)
WBC: 10 10*3/uL (ref 4.0–10.5)

## 2015-08-04 MED ORDER — POTASSIUM CHLORIDE CRYS ER 20 MEQ PO TBCR
30.0000 meq | EXTENDED_RELEASE_TABLET | Freq: Once | ORAL | Status: AC
  Administered 2015-08-04: 30 meq via ORAL
  Filled 2015-08-04: qty 1

## 2015-08-04 MED ORDER — CEFPODOXIME PROXETIL 200 MG PO TABS
200.0000 mg | ORAL_TABLET | Freq: Every day | ORAL | Status: DC
  Administered 2015-08-04 – 2015-08-13 (×10): 200 mg via ORAL
  Filled 2015-08-04 (×10): qty 1

## 2015-08-04 MED ORDER — AZITHROMYCIN 500 MG PO TABS
500.0000 mg | ORAL_TABLET | Freq: Every day | ORAL | Status: DC
  Administered 2015-08-04 – 2015-08-12 (×9): 500 mg via ORAL
  Filled 2015-08-04 (×10): qty 1

## 2015-08-04 NOTE — Progress Notes (Addendum)
[facility] PROGRESS NOTE  [name] [id number] DOB: 1934/09/07 DOA: 08/01/2015  PCP: Sherrie Mustache, MD  Brief HPI: 81 year old Caucasian female with a past medical history of coronary artery disease, hypertension, hyperlipidemia, hypothyroidism, anemia, presented with complaints of shortness of breath. She was recently seen by her PCP a few days ago and was started on antibiotics for a sinus infection. Patient's symptoms worsened and so she presented to the emergency department and was hospitalized for further management.  Past medical history:  Past Medical History  Diagnosis Date  . CAD (coronary artery disease)     50% proximal LAD stenosis, 50% marginal stenosis of the circumflex, RCA 95% stenosis, EF 40% stenosis with hypokinesis of the interior wall.  She had a Taxus stent placed in 04/16/2006  . Hypertension   . Abdominal aortic aneurysm ([place])   . Renal artery stenosis (HCC)     S/P stenting of the right renal atrophic left renal  . Dyslipidemia   . Hypothyroidism   . Tobacco user     Previous  . Adenomatous polyp 2004, 2011  . Hyperplastic colon polyp 2011  . Hemorrhoids   . Diverticulosis   . Renal insufficiency   . Anemia   . Myocardial infarction ([place])     stent 2008  . Constipation   . Cataract   . AAA (abdominal aortic aneurysm) ([place])     Consultants: None  Procedures:  Transthoracic echocardiogram Study Conclusions - Left ventricle: The cavity size was normal. Wall thickness was increased in a pattern of mild LVH. Systolic function was mildly reduced. The estimated ejection fraction was in the range of 45% to 50%. Doppler parameters are consistent with abnormal left ventricular relaxation (grade 1 diastolic dysfunction). - Right atrium: The atrium was moderately dilated. - Pulmonary arteries: Systolic pressure was moderately increased. PA peak pressure: 41 mm Hg (S).   Antibiotics: Ceftriaxone and azithromycin till 5/20 Vantin  5/20  Subjective: Patient feels slightly better. Still getting short of breath with exertion. Noted to be hypoxic last night requiring oxygen. Denies Chest pain.   Objective:  Vital Signs  Filed Vitals:   08/04/15 0045 08/04/15 0104 08/04/15 0242 08/04/15 0545  BP: 131/74 134/73    Pulse: 82 78 76   Temp: 97.8 F (36.6 C) 98 F (36.7 C) 98.6 F (37 C)   TempSrc: Oral Oral Oral   Resp:      Weight:    60.963 kg (134 lb 6.4 oz)  SpO2: 98% 97% 99%     Intake/Output Summary (Last 24 hours) at 08/04/15 1016 Last data filed at 08/03/15 2157  Gross per 24 hour  Intake    335 ml  Output      0 ml  Net    335 ml   Filed Weights   08/02/15 0338 08/03/15 0421 08/04/15 0545  Weight: 61.1 kg (134 lb 11.2 oz) 60.328 kg (133 lb) 60.963 kg (134 lb 6.4 oz)    General appearance: alert, cooperative, appears stated age and no distress Resp: Improved air entry bilaterally. Continues to have crackles bilateral lungs, though, appeared to be slightly less compared to before. No wheezing or rhonchi.  Cardio: regular rate and rhythm, S1, S2 normal, no murmur, click, rub or gallop. No pedal edema GI: soft, non-tender; bowel sounds normal; no masses,  no organomegaly Neurologic: Awake and alert. Oriented 3. No focal neurological deficits.  Lab Results:  Data Reviewed: I have personally reviewed following labs and imaging studies  CBC:  Recent  Labs Lab 08/01/15 1231 08/03/15 0530 08/04/15 0332  WBC 11.1* 9.9 10.0  HGB 8.2* 7.4* 8.5*  HCT 24.8* 22.9* 26.9*  MCV 93.6 91.6 89.4  PLT 300 307 0000000   Basic Metabolic Panel:  Recent Labs Lab 08/01/15 1231 08/03/15 0530 08/04/15 0332  NA 137 139 138  K 3.5 3.0* 2.9*  CL 104 103 102  CO2 20* 22 22  GLUCOSE 106* 118* 104*  BUN 35* 34* 33*  CREATININE 4.56* 5.05* 5.09*  CALCIUM 7.6* 7.4* 7.2*   GFR: Estimated Creatinine Clearance: 8.2 mL/min (by C-G formula based on Cr of 5.09).  Cardiac Enzymes:  Recent Labs Lab  08/01/15 2241 08/02/15 0839  TROPONINI 0.28* 0.18*   Thyroid Function Tests:  Recent Labs  08/01/15 1438  TSH 4.418   Anemia Panel:  Recent Labs  08/01/15 1500  VITAMINB12 2631*  FOLATE 20.4  FERRITIN 52  TIBC 273  IRON 14*  RETICCTPCT 1.8    Recent Results (from the past 240 hour(s))  Culture, blood (routine x 2) Call MD if unable to obtain prior to antibiotics being given     Status: None (Preliminary result)   Collection Time: 08/01/15  2:52 PM  Result Value Ref Range Status   Specimen Description BLOOD RIGHT HAND  Final   Special Requests IN PEDIATRIC BOTTLE 1CC  Final   Culture NO GROWTH 3 DAYS  Final   Report Status PENDING  Incomplete  Culture, blood (routine x 2) Call MD if unable to obtain prior to antibiotics being given     Status: None (Preliminary result)   Collection Time: 08/01/15  2:57 PM  Result Value Ref Range Status   Specimen Description BLOOD RIGHT ANTECUBITAL  Final   Special Requests IN PEDIATRIC BOTTLE 1CC  Final   Culture NO GROWTH 3 DAYS  Final   Report Status PENDING  Incomplete  Culture, sputum-assessment     Status: None   Collection Time: 08/02/15  5:27 AM  Result Value Ref Range Status   Specimen Description EXPECTORATED SPUTUM  Final   Special Requests NONE  Final   Sputum evaluation   Final    THIS SPECIMEN IS ACCEPTABLE. RESPIRATORY CULTURE REPORT TO FOLLOW.   Report Status 08/02/2015 FINAL  Final      Radiology Studies: Dg Chest 2 View  08/03/2015  CLINICAL DATA:  Cough, dyspnea, shortness of breath for 3 days EXAM: CHEST  2 VIEW COMPARISON:  08/01/2015 FINDINGS: Cardiomediastinal silhouette is stable. Elevation of the right hemidiaphragm again noted. Again noted bilateral mild interstitial prominence and patchy perihilar airspace opacification suspicious for mild interstitial edema. Atypical pneumonitis cannot be excluded. Clinical correlation is necessary. Mild degenerative changes mid thoracic spine. IMPRESSION: Elevation of  the right hemidiaphragm again noted. Again noted bilateral mild interstitial prominence and patchy perihilar airspace opacification suspicious for mild interstitial edema. Atypical pneumonitis cannot be excluded. Clinical correlation is necessary. Electronically Signed   By: Lahoma Crocker M.D.   On: 08/03/2015 11:00     Medications:  Scheduled: . azithromycin  500 mg Oral q1800  . citalopram  20 mg Oral Daily  . furosemide  80 mg Intravenous BID  . heparin  5,000 Units Subcutaneous Q8H  . levothyroxine  50 mcg Oral QAC breakfast  . linaclotide  145 mcg Oral Daily  . NIFEdipine  60 mg Oral Daily  . pantoprazole  40 mg Oral Daily  . pravastatin  80 mg Oral QHS  . saccharomyces boulardii  250 mg Oral BID  . sodium  chloride flush  3 mL Intravenous Q12H  . sodium chloride flush  3 mL Intravenous Q12H   Continuous:  [id number] chloride, acetaminophen, bisacodyl, fluticasone, meclizine, ondansetron **OR** ondansetron (ZOFRAN) IV, sodium chloride flush, traMADol, traZODone  Assessment/Plan:  Principal Problem:   Hypoxia Active Problems:   Coronary atherosclerosis   AAA (abdominal aortic aneurysm) (HCC)   Hypothyroidism   Chronic kidney disease (CKD), stage IV (severe) (HCC)   CAP (community acquired pneumonia)   Anemia   Elevated troponin   Congestive heart failure (HCC)   Acute renal failure superimposed on stage 4 chronic kidney disease (HCC)   Acute systolic CHF (congestive heart failure) (HCC)    Dyspnea, cough and fever/possible community-acquired pneumonia/Fluid overload/Hypoxia Patient is feeling better. Her lungs do sound improved. However, ins and outs have not been charted accurately. Weight has reduced. Repeat chest x-ray that was done on 5/19 showed persistent bilateral infiltrates, raising concern for fluid overload. Infection was also considered a possibility and so she has been on antibiotics. She was placed back on Lasix yesterday. Change to oral antibiotics today.  Cultures are all negative. Strep antigen was negative. Influenza PCR was negative. Continues to require 2 L of oxygen by nasal cannula.  Systolic CHF, likely acute Echocardiogram shows reduced EF. This is new compared to previous echocardiograms. She does have a history of CAD, as discussed above. She had a negative stress test in 2014. Does not need cardiology input at this time. She will definitely need outpatient follow-up with them. Her renal failure precludes invasive testing.  Acute on Chronic kidney disease stage IV Nephrology is following. She is on high-dose Lasix, which is being continued. Output not charted accurately yesterday. Patient is followed by Dr. Florene Glen with [facility]. Creatinine was 4.56 at the time of admission. Back in May 2016, it was 3.56. She recently had a left AV fistula placed in preparation for dialysis, although she is not on dialysis yet. Apparently she is on a transplant list. Creatinine is stable this morning.   History of coronary artery disease with mildly elevated troponin She is status post intervention in 2008. She denies any chest pain currently. Minimal elevation in troponin is noted, most likely due to demand ischemia and renal failure. Continue to monitor clinically. Continue home medications. Echocardiogram does show reduction in ejection fraction. Cardiology notes reviewed. She had a stress test in 2012, which did not show any ischemia. She also had a stress test in 2014 also normal and had normal EF at that time.  Normocytic anemia. Likely due to chronic disease. It was felt that her anemia could be contributing to her symptoms as well. So she was transfused 2 units of blood on 5/19. Hemoglobin did not respond appropriately. We will repeat tomorrow.  History of AAA, status post graft repair several years ago. Stable.  History of hypothyroidism. Continue home medications  DVT Prophylaxis: Subcutaneous heparin    Code Status: Full code  Family  Communication: Discussed with the patient Disposition Plan: Continue current treatment for now. Mobilize as tolerated.     LOS: 2 days   [facility] Pager [phone] 08/04/2015, 10:16 AM  If 7PM-7AM, please contact night-coverage at [email or web address], password [facility]

## 2015-08-04 NOTE — Progress Notes (Signed)
Admit: 08/01/2015 LOS: 2  19F with AoCKD5 presenting with SOB, b/l pulm infiltrates, CAP and/or dCHF exacerbation  Subjective:  2u PRBC overnight Hb up to 8.5 SCr stable  On PO ABX Feels improved, less dyspnea  05/19 0701 - 05/20 0700 In: 455 [P.O.:120; Blood:335] Out: -   Filed Weights   08/02/15 0338 08/03/15 0421 08/04/15 0545  Weight: 61.1 kg (134 lb 11.2 oz) 60.328 kg (133 lb) 60.963 kg (134 lb 6.4 oz)    Scheduled Meds: . azithromycin  500 mg Oral q1800  . cefpodoxime  200 mg Oral Daily  . citalopram  20 mg Oral Daily  . furosemide  80 mg Intravenous BID  . heparin  5,000 Units Subcutaneous Q8H  . levothyroxine  50 mcg Oral QAC breakfast  . linaclotide  145 mcg Oral Daily  . NIFEdipine  60 mg Oral Daily  . pantoprazole  40 mg Oral Daily  . pravastatin  80 mg Oral QHS  . saccharomyces boulardii  250 mg Oral BID  . sodium chloride flush  3 mL Intravenous Q12H  . sodium chloride flush  3 mL Intravenous Q12H   Continuous Infusions:  PRN Meds:.sodium chloride, acetaminophen, bisacodyl, fluticasone, meclizine, ondansetron **OR** ondansetron (ZOFRAN) IV, sodium chloride flush, traMADol, traZODone  Current Labs: reviewed    Physical Exam:  Blood pressure 134/73, pulse 76, temperature 98.6 F (37 C), temperature source Oral, resp. rate 18, weight 60.963 kg (134 lb 6.4 oz), SpO2 99 %. GEN: NAD ENT: NCAT EYES: EOMI CV: RRR PULM: diminished in bases ABD: s/nt/nd SKIN: no rashes/lesions [id number] edema LUE AVF +B/T  A 1. AoCKD5 2. Dypsnea/Hypoxia with CAP and/or CHF excacerbation with #4 contributing 3. LUE AVF, mature 4. Anemia, Hb 7.4; hypoproliferative; evidence of Fe deficiency; s/p pRBC 5/19  Plan 1. Continue antibiotics 2. Cont furosemide 80mg  IV BID -- change to daily tomorrow perhaps 3. No HD need today 4. Replete K today   Pearson Grippe MD 08/04/2015, 11:16 AM   Recent Labs Lab 08/01/15 1231 08/03/15 0530 08/04/15 0332  NA 137 139 138  K 3.5  3.0* 2.9*  CL 104 103 102  CO2 20* 22 22  GLUCOSE 106* 118* 104*  BUN 35* 34* 33*  CREATININE 4.56* 5.05* 5.09*  CALCIUM 7.6* 7.4* 7.2*    Recent Labs Lab 08/01/15 1231 08/03/15 0530 08/04/15 0332  WBC 11.1* 9.9 10.0  HGB 8.2* 7.4* 8.5*  HCT 24.8* 22.9* 26.9*  MCV 93.6 91.6 89.4  PLT 300 307 293

## 2015-08-04 NOTE — Care Management Important Message (Signed)
Important Message  Patient Details  Name: [name] MRN: [id number] Date of Birth: 05/04/1934   Medicare Important Message Given:  Yes    [name] Abena 08/04/2015, 11:00 AM

## 2015-08-05 LAB — CBC
HCT: 27.5 % — ABNORMAL LOW (ref 36.0–46.0)
Hemoglobin: 8.7 g/dL — ABNORMAL LOW (ref 12.0–15.0)
MCH: 28.2 pg (ref 26.0–34.0)
MCHC: 31.6 g/dL (ref 30.0–36.0)
MCV: 89.3 fL (ref 78.0–100.0)
PLATELETS: 315 10*3/uL (ref 150–400)
RBC: 3.08 MIL/uL — AB (ref 3.87–5.11)
RDW: 15.3 % (ref 11.5–15.5)
WBC: 10.1 10*3/uL (ref 4.0–10.5)

## 2015-08-05 LAB — BASIC METABOLIC PANEL
ANION GAP: 12 (ref 5–15)
BUN: 35 mg/dL — ABNORMAL HIGH (ref 6–20)
CALCIUM: 7.3 mg/dL — AB (ref 8.9–10.3)
CO2: 22 mmol/L (ref 22–32)
Chloride: 103 mmol/L (ref 101–111)
Creatinine, Ser: 5.08 mg/dL — ABNORMAL HIGH (ref 0.44–1.00)
GFR, EST AFRICAN AMERICAN: 9 mL/min — AB (ref >60)
GFR, EST NON AFRICAN AMERICAN: 7 mL/min — AB (ref >60)
GLUCOSE: 107 mg/dL — AB (ref 65–99)
POTASSIUM: 2.8 mmol/L — AB (ref 3.5–5.1)
Sodium: 137 mmol/L (ref 135–145)

## 2015-08-05 LAB — CULTURE, RESPIRATORY W GRAM STAIN

## 2015-08-05 LAB — TYPE AND SCREEN
ABO/RH(D): O NEG
ANTIBODY SCREEN: NEGATIVE
UNIT DIVISION: 0
Unit division: 0

## 2015-08-05 LAB — PROCALCITONIN: PROCALCITONIN: 0.41 ng/mL

## 2015-08-05 LAB — CULTURE, RESPIRATORY: CULTURE: NORMAL

## 2015-08-05 MED ORDER — POTASSIUM CHLORIDE CRYS ER 20 MEQ PO TBCR
40.0000 meq | EXTENDED_RELEASE_TABLET | Freq: Once | ORAL | Status: AC
  Administered 2015-08-05: 40 meq via ORAL
  Filled 2015-08-05: qty 2

## 2015-08-05 MED ORDER — FERROUS SULFATE 325 (65 FE) MG PO TABS
325.0000 mg | ORAL_TABLET | Freq: Three times a day (TID) | ORAL | Status: DC
  Administered 2015-08-05 – 2015-08-06 (×3): 325 mg via ORAL
  Filled 2015-08-05 (×3): qty 1

## 2015-08-05 NOTE — Progress Notes (Signed)
Admit: 08/01/2015 LOS: 3  [name] with AoCKD5 presenting with SOB, b/l pulm infiltrates, CAP and/or dCHF exacerbation  Subjective:  Hb stable Good UOP in past 24h on BID IV Lasix SCr stable  K remains low Cont to have SOB on exertion  05/20 0701 - 05/21 0700 In: 960 [P.O.:960] Out: 2201 [Urine:2200; Stool:1]  Filed Weights   08/03/15 0421 08/04/15 0545 08/05/15 0552  Weight: 60.328 kg (133 lb) 60.963 kg (134 lb 6.4 oz) 60.963 kg (134 lb 6.4 oz)   Scheduled Meds: . azithromycin  500 mg Oral q1800  . cefpodoxime  200 mg Oral Daily  . citalopram  20 mg Oral Daily  . furosemide  80 mg Intravenous BID  . heparin  5,000 Units Subcutaneous Q8H  . levothyroxine  50 mcg Oral QAC breakfast  . linaclotide  145 mcg Oral Daily  . NIFEdipine  60 mg Oral Daily  . pantoprazole  40 mg Oral Daily  . pravastatin  80 mg Oral QHS  . saccharomyces boulardii  250 mg Oral BID  . sodium chloride flush  3 mL Intravenous Q12H  . sodium chloride flush  3 mL Intravenous Q12H   Continuous Infusions:  PRN Meds:.sodium chloride, acetaminophen, bisacodyl, fluticasone, meclizine, ondansetron **OR** ondansetron (ZOFRAN) IV, sodium chloride flush, traMADol, traZODone  Current Labs: reviewed   Physical Exam:  Blood pressure 139/68, pulse 78, temperature 98.2 F (36.8 C), temperature source Oral, resp. rate 18, weight 60.963 kg (134 lb 6.4 oz), SpO2 96 %. GEN: NAD ENT: NCAT EYES: EOMI CV: RRR PULM: diminished in bases ABD: s/nt/nd SKIN: no rashes/lesions [id number] edema LUE AVF +B/T  A 1. AoCKD5 BL SCr 3.6-3.9; Sees [facility] 2. Dypsnea/Hypoxia with CAP and/or CHF excacerbation with #4 contributing 3. LUE AVF, mature 4. Anemia; hypoproliferative; evidence of Fe deficiency; s/p 2u pRBC 5/19  Plan 1. Continue antibiotics 2. Cont furosemide 80mg  IV BID 3. No HD need today 4. Replete K today 5. Start FeSO4  Pearson Grippe MD 08/05/2015, 10:46 AM   Recent Labs Lab 08/03/15 0530 08/04/15 0332  08/05/15 0339  NA 139 138 137  K 3.0* 2.9* 2.8*  CL 103 102 103  CO2 22 22 22   GLUCOSE 118* 104* 107*  BUN 34* 33* 35*  CREATININE 5.05* 5.09* 5.08*  CALCIUM 7.4* 7.2* 7.3*    Recent Labs Lab 08/03/15 0530 08/04/15 0332 08/05/15 0339  WBC 9.9 10.0 10.1  HGB 7.4* 8.5* 8.7*  HCT 22.9* 26.9* 27.5*  MCV 91.6 89.4 89.3  PLT 307 293 315

## 2015-08-05 NOTE — Progress Notes (Signed)
[facility] PROGRESS NOTE  [name] [id number] DOB: [birth year] DOA: 08/01/2015  PCP: Krista Mustache, MD  Brief HPI: 81 year old Caucasian female with a past medical history of coronary artery disease, hypertension, hyperlipidemia, hypothyroidism, anemia, presented with complaints of shortness of breath. She was recently seen by her PCP a few days ago and was started on antibiotics for a sinus infection. Patient's symptoms worsened and so she presented to the emergency department and was hospitalized for further management.  Past medical history:  Past Medical History  Diagnosis Date  . CAD (coronary artery disease)     50% proximal LAD stenosis, 50% marginal stenosis of the circumflex, RCA 95% stenosis, EF 40% stenosis with hypokinesis of the interior wall.  She had a Taxus stent placed in 04/16/2006  . Hypertension   . Abdominal aortic aneurysm ([place])   . Renal artery stenosis (HCC)     S/P stenting of the right renal atrophic left renal  . Dyslipidemia   . Hypothyroidism   . Tobacco user     Previous  . Adenomatous polyp 2004, 2011  . Hyperplastic colon polyp 2011  . Hemorrhoids   . Diverticulosis   . Renal insufficiency   . Anemia   . Myocardial infarction ([place])     stent 2008  . Constipation   . Cataract   . AAA (abdominal aortic aneurysm) ([place])     Consultants: None  Procedures:  Transthoracic echocardiogram Study Conclusions - Left ventricle: The cavity size was normal. Wall thickness was increased in a pattern of mild LVH. Systolic function was mildly reduced. The estimated ejection fraction was in the range of 45% to 50%. Doppler parameters are consistent with abnormal left ventricular relaxation (grade 1 diastolic dysfunction). - Right atrium: The atrium was moderately dilated. - Pulmonary arteries: Systolic pressure was moderately increased. PA peak pressure: 41 mm Hg (S).   Antibiotics: Ceftriaxone and azithromycin till 5/20 Vantin  5/20  Subjective: Patient still getting quite short of breath even with minimal activity. Denies any chest pain. Continues to have a cough with brownish expectoration.   Objective:  Vital Signs  Filed Vitals:   08/04/15 0545 08/04/15 1302 08/04/15 2007 08/05/15 0552  BP:  119/61 131/73 139/68  Pulse:  84 90 78  Temp:  97.7 F (36.5 C) 98 F (36.7 C) 98.2 F (36.8 C)  TempSrc:  Oral Oral Oral  Resp:  18 20 18   Weight: 60.963 kg (134 lb 6.4 oz)   60.963 kg (134 lb 6.4 oz)  SpO2:  96% 96% 96%    Intake/Output Summary (Last 24 hours) at 08/05/15 1028 Last data filed at 08/05/15 0836  Gross per 24 hour  Intake    600 ml  Output   2551 ml  Net  -1951 ml   Filed Weights   08/03/15 0421 08/04/15 0545 08/05/15 0552  Weight: 60.328 kg (133 lb) 60.963 kg (134 lb 6.4 oz) 60.963 kg (134 lb 6.4 oz)    General appearance: alert, cooperative, appears stated age and no distress Resp: Air entry has improved bilaterally. Continues to have crackles in both lungs, perhaps less than before. No wheezing.   Cardio: regular rate and rhythm, S1, S2 normal, no murmur, click, rub or gallop. No pedal edema GI: soft, non-tender; bowel sounds normal; no masses,  no organomegaly Neurologic: Awake and alert. Oriented 3. No focal neurological deficits.  Lab Results:  Data Reviewed: I have personally reviewed following labs and imaging studies  CBC:  Recent Labs Lab 08/01/15  1231 08/03/15 0530 08/04/15 0332 08/05/15 0339  WBC 11.1* 9.9 10.0 10.1  HGB 8.2* 7.4* 8.5* 8.7*  HCT 24.8* 22.9* 26.9* 27.5*  MCV 93.6 91.6 89.4 89.3  PLT 300 307 293 [id number]   Basic Metabolic Panel:  Recent Labs Lab 08/01/15 1231 08/03/15 0530 08/04/15 0332 08/05/15 0339  NA 137 139 138 137  K 3.5 3.0* 2.9* 2.8*  CL 104 103 102 103  CO2 20* 22 22 22   GLUCOSE 106* 118* 104* 107*  BUN 35* 34* 33* 35*  CREATININE 4.56* 5.05* 5.09* 5.08*  CALCIUM 7.6* 7.4* 7.2* 7.3*   GFR: Estimated Creatinine Clearance: 8.2  mL/min (by C-G formula based on Cr of 5.08).  Cardiac Enzymes:  Recent Labs Lab 08/01/15 2241 08/02/15 0839  TROPONINI 0.28* 0.18*     Recent Results (from the past 240 hour(s))  Culture, blood (routine x 2) Call MD if unable to obtain prior to antibiotics being given     Status: None (Preliminary result)   Collection Time: 08/01/15  2:52 PM  Result Value Ref Range Status   Specimen Description BLOOD RIGHT HAND  Final   Special Requests IN PEDIATRIC BOTTLE 1CC  Final   Culture NO GROWTH 3 DAYS  Final   Report Status PENDING  Incomplete  Culture, blood (routine x 2) Call MD if unable to obtain prior to antibiotics being given     Status: None (Preliminary result)   Collection Time: 08/01/15  2:57 PM  Result Value Ref Range Status   Specimen Description BLOOD RIGHT ANTECUBITAL  Final   Special Requests IN PEDIATRIC BOTTLE 1CC  Final   Culture NO GROWTH 3 DAYS  Final   Report Status PENDING  Incomplete  Culture, sputum-assessment     Status: None   Collection Time: 08/02/15  5:27 AM  Result Value Ref Range Status   Specimen Description EXPECTORATED SPUTUM  Final   Special Requests NONE  Final   Sputum evaluation   Final    THIS SPECIMEN IS ACCEPTABLE. RESPIRATORY CULTURE REPORT TO FOLLOW.   Report Status 08/02/2015 FINAL  Final  Culture, respiratory (NON-Expectorated)     Status: None (Preliminary result)   Collection Time: 08/02/15  7:28 AM  Result Value Ref Range Status   Specimen Description SPUTUM  Final   Special Requests NONE  Final   Gram Stain PENDING  Incomplete   Culture   Final    Culture reincubated for better growth Performed at [facility]    Report Status PENDING  Incomplete      Radiology Studies: Dg Chest 2 View  08/03/2015  CLINICAL DATA:  Cough, dyspnea, shortness of breath for 3 days EXAM: CHEST  2 VIEW COMPARISON:  08/01/2015 FINDINGS: Cardiomediastinal silhouette is stable. Elevation of the right hemidiaphragm again noted. Again noted  bilateral mild interstitial prominence and patchy perihilar airspace opacification suspicious for mild interstitial edema. Atypical pneumonitis cannot be excluded. Clinical correlation is necessary. Mild degenerative changes mid thoracic spine. IMPRESSION: Elevation of the right hemidiaphragm again noted. Again noted bilateral mild interstitial prominence and patchy perihilar airspace opacification suspicious for mild interstitial edema. Atypical pneumonitis cannot be excluded. Clinical correlation is necessary. Electronically Signed   By: Lahoma Crocker M.D.   On: 08/03/2015 11:00     Medications:  Scheduled: . azithromycin  500 mg Oral q1800  . cefpodoxime  200 mg Oral Daily  . citalopram  20 mg Oral Daily  . furosemide  80 mg Intravenous BID  . heparin  5,000 Units  Subcutaneous Q8H  . levothyroxine  50 mcg Oral QAC breakfast  . linaclotide  145 mcg Oral Daily  . NIFEdipine  60 mg Oral Daily  . pantoprazole  40 mg Oral Daily  . pravastatin  80 mg Oral QHS  . saccharomyces boulardii  250 mg Oral BID  . sodium chloride flush  3 mL Intravenous Q12H  . sodium chloride flush  3 mL Intravenous Q12H   Continuous:  [id number] chloride, acetaminophen, bisacodyl, fluticasone, meclizine, ondansetron **OR** ondansetron (ZOFRAN) IV, sodium chloride flush, traMADol, traZODone  Assessment/Plan:  Principal Problem:   Hypoxia Active Problems:   Coronary atherosclerosis   AAA (abdominal aortic aneurysm) (HCC)   Hypothyroidism   Chronic kidney disease (CKD), stage IV (severe) (HCC)   CAP (community acquired pneumonia)   Anemia   Elevated troponin   Congestive heart failure (HCC)   Acute renal failure superimposed on stage 4 chronic kidney disease (HCC)   Acute systolic CHF (congestive heart failure) (HCC)    Dyspnea, cough and fever/possible community-acquired pneumonia/Fluid overload/Acute respiratory failure with hypoxia Symptomatically, patient seems to be slightly better, however, still gets  quite dyspneic with minimal exertion. She did put out quite a bit of urine yesterday. Weight remains stable. Continue current management for now. Repeat chest x-ray that was done on 5/19 showed persistent bilateral infiltrates, raising concern for fluid overload. Infection was also considered a possibility and so she has been on antibiotics. She was placed back on Lasix. Now on oral antibiotics. Cultures are all negative. Strep antigen was negative. Influenza PCR was negative. Continues to require 2 L of oxygen by nasal cannula.  Systolic CHF, likely acute Echocardiogram shows reduced EF. This is new compared to previous echocardiograms. She does have a history of CAD, as discussed above. She had a negative stress test in 2014. Does not need cardiology input at this time. She will need outpatient follow-up with them. Her renal failure precludes invasive testing.  Acute on Chronic kidney disease stage IV Creatinine is stable this morning, though is higher than her baseline. Nephrology is following. She is on high-dose Lasix, which is being continued. Patient is followed by Dr. Florene Glen with [place] [facility]. Creatinine was 4.56 at the time of admission. Back in May 2016, it was 3.56. She recently had a left AV fistula placed in preparation for dialysis, although she is not on dialysis yet. Apparently she is on a transplant list.   History of coronary artery disease with mildly elevated troponin She is status post intervention in 2008. She denies any chest pain currently. Minimal elevation in troponin is noted, most likely due to demand ischemia and renal failure. Continue to monitor clinically. Continue home medications. Echocardiogram does show reduction in ejection fraction. Previous cardiology notes reviewed. She had a stress test in 2012, which did not show any ischemia. She also had a stress test in 2014 also normal and had normal EF at that time.  Normocytic anemia. Likely due to chronic disease. It  was felt that her anemia could be contributing to her symptoms as well. So she was transfused 2 units of blood on 5/19. Hemoglobin did not respond appropriately but remains stable.  History of AAA, status post graft repair several years ago. Stable.  History of hypothyroidism. Continue home medications  DVT Prophylaxis: Subcutaneous heparin    Code Status: Full code  Family Communication: Discussed with the patient and her daughter Disposition Plan: Continue current treatment for now. Mobilize as tolerated.     LOS: 3 days  [facility] Pager [phone] 08/05/2015, 10:28 AM  If 7PM-7AM, please contact night-coverage at [email or web address], password [facility]

## 2015-08-06 ENCOUNTER — Inpatient Hospital Stay (HOSPITAL_COMMUNITY): Payer: Medicare HMO

## 2015-08-06 LAB — BASIC METABOLIC PANEL
ANION GAP: 12 (ref 5–15)
BUN: 37 mg/dL — ABNORMAL HIGH (ref 6–20)
CALCIUM: 7.6 mg/dL — AB (ref 8.9–10.3)
CO2: 23 mmol/L (ref 22–32)
Chloride: 103 mmol/L (ref 101–111)
Creatinine, Ser: 4.85 mg/dL — ABNORMAL HIGH (ref 0.44–1.00)
GFR, EST AFRICAN AMERICAN: 9 mL/min — AB (ref >60)
GFR, EST NON AFRICAN AMERICAN: 8 mL/min — AB (ref >60)
GLUCOSE: 100 mg/dL — AB (ref 65–99)
Potassium: 3.2 mmol/L — ABNORMAL LOW (ref 3.5–5.1)
SODIUM: 138 mmol/L (ref 135–145)

## 2015-08-06 LAB — CULTURE, BLOOD (ROUTINE X 2)
Culture: NO GROWTH
Culture: NO GROWTH

## 2015-08-06 MED ORDER — POTASSIUM CHLORIDE CRYS ER 20 MEQ PO TBCR
20.0000 meq | EXTENDED_RELEASE_TABLET | Freq: Once | ORAL | Status: AC
  Administered 2015-08-06: 20 meq via ORAL
  Filled 2015-08-06: qty 1

## 2015-08-06 MED ORDER — SODIUM CHLORIDE 0.9 % IV SOLN
510.0000 mg | Freq: Once | INTRAVENOUS | Status: AC
  Administered 2015-08-06: 510 mg via INTRAVENOUS
  Filled 2015-08-06: qty 17

## 2015-08-06 NOTE — Progress Notes (Signed)
[facility] PROGRESS NOTE  [name] [id number] DOB: Apr 04, 1934 DOA: 08/01/2015  PCP: Sherrie Mustache, MD  Brief HPI: 81 year old Caucasian female with a past medical history of coronary artery disease, hypertension, hyperlipidemia, hypothyroidism, anemia, presented with complaints of shortness of breath. She was recently seen by her PCP a few days ago and was started on antibiotics for a sinus infection. Patient's symptoms worsened and so she presented to the emergency department and was hospitalized for further management. Patient was initially started on IV antibiotics. She was slow to improve. Her creatinine worsened. Chest x-ray showed persistent infiltrates. She was given high dose Lasix. Nephrology was consulted.  Past medical history:  Past Medical History  Diagnosis Date  . CAD (coronary artery disease)     50% proximal LAD stenosis, 50% marginal stenosis of the circumflex, RCA 95% stenosis, EF 40% stenosis with hypokinesis of the interior wall.  She had a Taxus stent placed in 04/16/2006  . Hypertension   . Abdominal aortic aneurysm ([place])   . Renal artery stenosis (HCC)     S/P stenting of the right renal atrophic left renal  . Dyslipidemia   . Hypothyroidism   . Tobacco user     Previous  . Adenomatous polyp 2004, 2011  . Hyperplastic colon polyp 2011  . Hemorrhoids   . Diverticulosis   . Renal insufficiency   . Anemia   . Myocardial infarction ([place])     stent 2008  . Constipation   . Cataract   . AAA (abdominal aortic aneurysm) ([place])     Consultants: None  Procedures:  Transthoracic echocardiogram Study Conclusions - Left ventricle: The cavity size was normal. Wall thickness was increased in a pattern of mild LVH. Systolic function was mildly reduced. The estimated ejection fraction was in the range of 45% to 50%. Doppler parameters are consistent with abnormal left ventricular relaxation (grade 1 diastolic dysfunction). - Right atrium: The  atrium was moderately dilated. - Pulmonary arteries: Systolic pressure was moderately increased. PA peak pressure: 41 mm Hg (S).   Antibiotics: Ceftriaxone and azithromycin till 5/20 Vantin 5/20  Subjective: Patient still feels dyspneic with minimal exertion. Denies any chest pain. No nausea or vomiting. Continues to have a cough with brownish expectoration.   Objective:  Vital Signs  Filed Vitals:   08/05/15 0552 08/05/15 1502 08/05/15 2111 08/06/15 0634  BP: 139/68 107/60 115/57 140/72  Pulse: 78 93 88 85  Temp: 98.2 F (36.8 C) 99.1 F (37.3 C) 98.2 F (36.8 C) 97.5 F (36.4 C)  TempSrc: Oral Oral Oral Oral  Resp: 18 20 18 18   Weight: 60.963 kg (134 lb 6.4 oz)   60.374 kg (133 lb 1.6 oz)  SpO2: 96% 92% 96% 93%    Intake/Output Summary (Last 24 hours) at 08/06/15 0814 Last data filed at 08/06/15 [id number]  Gross per 24 hour  Intake    480 ml  Output   2600 ml  Net  -2120 ml   Filed Weights   08/04/15 0545 08/05/15 0552 08/06/15 0634  Weight: 60.963 kg (134 lb 6.4 oz) 60.963 kg (134 lb 6.4 oz) 60.374 kg (133 lb 1.6 oz)    General appearance: alert, cooperative, appears stated age and no distress Resp: Air entry has improved bilaterally. Continues to have crackles in both lungs, perhaps less than before. No wheezing.   Cardio: regular rate and rhythm, S1, S2 normal, no murmur, click, rub or gallop. No pedal edema GI: soft, non-tender; bowel sounds normal; no masses,  no organomegaly Neurologic: Awake and alert. Oriented 3. No focal neurological deficits.  Lab Results:  Data Reviewed: I have personally reviewed following labs and imaging studies  CBC:  Recent Labs Lab 08/01/15 1231 08/03/15 0530 08/04/15 0332 08/05/15 0339  WBC 11.1* 9.9 10.0 10.1  HGB 8.2* 7.4* 8.5* 8.7*  HCT 24.8* 22.9* 26.9* 27.5*  MCV 93.6 91.6 89.4 89.3  PLT 300 307 293 [id number]   Basic Metabolic Panel:  Recent Labs Lab 08/01/15 1231 08/03/15 0530 08/04/15 0332 08/05/15 0339  08/06/15 0300  NA 137 139 138 137 138  K 3.5 3.0* 2.9* 2.8* 3.2*  CL 104 103 102 103 103  CO2 20* 22 22 22 23   GLUCOSE 106* 118* 104* 107* 100*  BUN 35* 34* 33* 35* 37*  CREATININE 4.56* 5.05* 5.09* 5.08* 4.85*  CALCIUM 7.6* 7.4* 7.2* 7.3* 7.6*   GFR: Estimated Creatinine Clearance: 8.6 mL/min (by C-G formula based on Cr of 4.85).  Cardiac Enzymes:  Recent Labs Lab 08/01/15 2241 08/02/15 0839  TROPONINI 0.28* 0.18*     Recent Results (from the past 240 hour(s))  Culture, blood (routine x 2) Call MD if unable to obtain prior to antibiotics being given     Status: None (Preliminary result)   Collection Time: 08/01/15  2:52 PM  Result Value Ref Range Status   Specimen Description BLOOD RIGHT HAND  Final   Special Requests IN PEDIATRIC BOTTLE 1CC  Final   Culture NO GROWTH 4 DAYS  Final   Report Status PENDING  Incomplete  Culture, blood (routine x 2) Call MD if unable to obtain prior to antibiotics being given     Status: None (Preliminary result)   Collection Time: 08/01/15  2:57 PM  Result Value Ref Range Status   Specimen Description BLOOD RIGHT ANTECUBITAL  Final   Special Requests IN PEDIATRIC BOTTLE 1CC  Final   Culture NO GROWTH 4 DAYS  Final   Report Status PENDING  Incomplete  Culture, sputum-assessment     Status: None   Collection Time: 08/02/15  5:27 AM  Result Value Ref Range Status   Specimen Description EXPECTORATED SPUTUM  Final   Special Requests NONE  Final   Sputum evaluation   Final    THIS SPECIMEN IS ACCEPTABLE. RESPIRATORY CULTURE REPORT TO FOLLOW.   Report Status 08/02/2015 FINAL  Final  Culture, respiratory (NON-Expectorated)     Status: None   Collection Time: 08/02/15  7:28 AM  Result Value Ref Range Status   Specimen Description SPUTUM  Final   Special Requests NONE  Final   Gram Stain   Final    FEW WBC FEW SQUAMOUS EPITHELIAL CELLS PRESENT FEW GRAM POSITIVE COCCI IN PAIRS Performed at [facility]    Culture   Final     NORMAL OROPHARYNGEAL FLORA Performed at [facility]    Report Status 08/05/2015 FINAL  Final      Radiology Studies: No results found.   Medications:  Scheduled: . azithromycin  500 mg Oral q1800  . cefpodoxime  200 mg Oral Daily  . citalopram  20 mg Oral Daily  . ferrous sulfate  325 mg Oral TID WC  . furosemide  80 mg Intravenous BID  . heparin  5,000 Units Subcutaneous Q8H  . levothyroxine  50 mcg Oral QAC breakfast  . linaclotide  145 mcg Oral Daily  . NIFEdipine  60 mg Oral Daily  . pantoprazole  40 mg Oral Daily  . pravastatin  80 mg Oral  QHS  . saccharomyces boulardii  250 mg Oral BID  . sodium chloride flush  3 mL Intravenous Q12H  . sodium chloride flush  3 mL Intravenous Q12H   Continuous:  [id number] chloride, acetaminophen, bisacodyl, fluticasone, meclizine, ondansetron **OR** ondansetron (ZOFRAN) IV, sodium chloride flush, traMADol, traZODone  Assessment/Plan:  Principal Problem:   Hypoxia Active Problems:   Coronary atherosclerosis   AAA (abdominal aortic aneurysm) (HCC)   Hypothyroidism   Chronic kidney disease (CKD), stage IV (severe) (HCC)   CAP (community acquired pneumonia)   Anemia   Elevated troponin   Congestive heart failure (HCC)   Acute renal failure superimposed on stage 4 chronic kidney disease (HCC)   Acute systolic CHF (congestive heart failure) (HCC)    Dyspnea, cough and fever/possible community-acquired pneumonia/Fluid overload/Acute respiratory failure with hypoxia Patient about the same. She however, continues to diurese well. Weight is decreasing slowly. Continue current management for now with diuretics and oral antibiotics. Will repeat chest x-ray today to see if there is any improvement in her infiltrates. Infection was also considered a possibility and so she has been on antibiotics. Cultures are all negative. Strep antigen was negative. Influenza PCR was negative. Continues to require 2 L of oxygen by nasal  cannula.  Acute Systolic CHF Echocardiogram shows reduced EF. This is new compared to previous echocardiograms. She does have a history of CAD, as discussed above. She had a negative stress test in 2014. Does not need cardiology input at this time. She will need outpatient follow-up with them. Her renal failure precludes invasive testing.  Acute on Chronic kidney disease stage IV Creatinine shows slight improvement. This morning compared to yesterday. Appreciate nephrology assistance. Continue IV Lasix. Patient is followed by Dr. Florene Glen with [place] [facility]. Creatinine was 4.56 at the time of admission. Back in May 2016, it was 3.56. She recently had a left AV fistula placed in preparation for dialysis, although she is not on dialysis yet. Apparently she is on a transplant list. Apparently she got a call from [facility] regarding availability of an organ, but since she was in the hospital with possible infection this was not pursued further.  History of coronary artery disease with mildly elevated troponin She is status post intervention in 2008. She denies any chest pain currently. Minimal elevation in troponin is noted, most likely due to demand ischemia and renal failure. Continue to monitor clinically. Continue home medications. Echocardiogram does show reduction in ejection fraction. Previous cardiology notes reviewed. She had a stress test in 2012, which did not show any ischemia. She also had a stress test in 2014 also normal and had normal EF at that time. She will need outpatient follow-up with cardiology.  Normocytic anemia. Likely due to chronic disease. It was felt that her anemia could be contributing to her symptoms as well. So she was transfused 2 units of blood on 5/19. Hemoglobin did not respond appropriately but remains stable.  History of AAA, status post graft repair several years ago. Stable.  History of hypothyroidism. Continue home medications  DVT Prophylaxis:  Subcutaneous heparin    Code Status: Full code  Family Communication: Discussed with the patient and her  Husband Disposition Plan: Continue current treatment for now. Mobilize as tolerated.     LOS: 4 days   [facility] Pager [phone] 08/06/2015, 8:14 AM  If 7PM-7AM, please contact night-coverage at [email or web address], password [facility]

## 2015-08-06 NOTE — Progress Notes (Signed)
Patient ID: [name], female   DOB: 1934-11-12, 81 y.o.   MRN: [id number]  [facility] Progress Note   Assessment/ Plan:   1. AKI on chronic kidney disease stage V (baseline creatinine 3.6-3.9). Suspected to be hemodynamically mediated by acute exacerbation of diastolic heart failure. Fortunately, she continues to respond to diuretic therapy with sluggish improvement of renal function but without indications for acute dialysis as yet. (She has a left BCF). Continue furosemide 80 mg IV twice a day for the next 24 hours and then decrease to 40 mg IV twice a day with oral conversion thereafter. 2. Acute exacerbation of diastolic heart failure: Clinically improving with ongoing diuretic therapy-continue this for another 24 hours. 3. Hypokalemia: Secondary to renal wasting from recent increase in diuretic therapy-replace cautiously via oral route. 4. Anemia: With iron deficiency (iron saturation 5% with ferritin 52), status post 2 units PRBC. Will give intravenous iron. 5. Possible community-acquired pneumonia: On antimicrobial coverage with azithromycin/cefpodoxime.  Subjective:   She reports to be breathing better this morning. She states that somebody from [facility] called her daughter yesterday to inform her that she was being called to be a backup for kidney transplant.    Objective:   BP 140/72 mmHg  Pulse 85  Temp(Src) 97.5 F (36.4 C) (Oral)  Resp 18  Wt 60.374 kg (133 lb 1.6 oz)  SpO2 93%  Intake/Output Summary (Last 24 hours) at 08/06/15 1009 Last data filed at 08/06/15 [id number]  Gross per 24 hour  Intake    240 ml  Output   2000 ml  Net  -1760 ml   Weight change: -0.59 kg (-1 lb 4.8 oz)  Physical Exam: [id number] resting in bed CVS: Pulse regular rhythm, normal rate Resp: Poor inspiratory effort with fine rales left base otherwise clear Abd: Soft, flat, nontender Ext: Trace ankle edema. Left brachiocephalic fistula with pulsatile  thrill.  Imaging: Dg Chest 2 View  08/06/2015  CLINICAL DATA:  Onset of dyspnea today; history of coronary artery disease, CHF, severe chronic renal insufficiency, former smoker. EXAM: CHEST  2 VIEW COMPARISON:  Chest x-ray dated Aug 03, 2015. FINDINGS: The right hemidiaphragm remains elevated. The pulmonary interstitial markings remain increased and any areas demonstrate confluence. These lung markings are slightly more conspicuous than on the previous study. The cardiac silhouette remains enlarged. The ascending and descending thoracic aorta as remain tortuous. A large hiatal hernia is present. There is no pleural effusion or pneumothorax. An abdominal aortic stent graft is partially visible in the field of view. The observed bony thorax is unremarkable. IMPRESSION: 1. Persistent increased interstitial markings consistent with mild pulmonary interstitial edema superimposed upon chronic bronchitic changes. Stable cardiomegaly. Stable tortuosity of the ascending and descending thoracic aorta. 2. Areas of confluent density in the right upper lobe likely reflects early pneumonia though pulmonary edema could produce similar findings. 3. Moderate-sized hiatal hernia containing an air-fluid level. Electronically Signed   By: David  Martinique M.D.   On: 08/06/2015 08:49    Labs: BMET  Recent Labs Lab 08/01/15 1231 08/03/15 0530 08/04/15 0332 08/05/15 0339 08/06/15 0300  NA 137 139 138 137 138  K 3.5 3.0* 2.9* 2.8* 3.2*  CL 104 103 102 103 103  CO2 20* 22 22 22 23   GLUCOSE 106* 118* 104* 107* 100*  BUN 35* 34* 33* 35* 37*  CREATININE 4.56* 5.05* 5.09* 5.08* 4.85*  CALCIUM 7.6* 7.4* 7.2* 7.3* 7.6*   CBC  Recent Labs Lab 08/01/15 1231 08/03/15 0530 08/04/15  [id number] 08/05/15 0339  WBC 11.1* 9.9 10.0 10.1  HGB 8.2* 7.4* 8.5* 8.7*  HCT 24.8* 22.9* 26.9* 27.5*  MCV 93.6 91.6 89.4 89.3  PLT 300 307 293 315   Medications:    . azithromycin  500 mg Oral q1800  . cefpodoxime  200 mg Oral Daily   . citalopram  20 mg Oral Daily  . ferrous sulfate  325 mg Oral TID WC  . furosemide  80 mg Intravenous BID  . heparin  5,000 Units Subcutaneous Q8H  . levothyroxine  50 mcg Oral QAC breakfast  . linaclotide  145 mcg Oral Daily  . NIFEdipine  60 mg Oral Daily  . pantoprazole  40 mg Oral Daily  . pravastatin  80 mg Oral QHS  . saccharomyces boulardii  250 mg Oral BID  . sodium chloride flush  3 mL Intravenous Q12H  . sodium chloride flush  3 mL Intravenous Q12H   Elmarie Shiley, MD 08/06/2015, 10:09 AM

## 2015-08-07 ENCOUNTER — Inpatient Hospital Stay (HOSPITAL_COMMUNITY): Payer: Medicare HMO

## 2015-08-07 DIAGNOSIS — N179 Acute kidney failure, unspecified: Secondary | ICD-10-CM

## 2015-08-07 DIAGNOSIS — I2510 Atherosclerotic heart disease of native coronary artery without angina pectoris: Secondary | ICD-10-CM

## 2015-08-07 DIAGNOSIS — R079 Chest pain, unspecified: Secondary | ICD-10-CM

## 2015-08-07 DIAGNOSIS — J9601 Acute respiratory failure with hypoxia: Principal | ICD-10-CM

## 2015-08-07 DIAGNOSIS — N184 Chronic kidney disease, stage 4 (severe): Secondary | ICD-10-CM

## 2015-08-07 LAB — TROPONIN I
TROPONIN I: 0.06 ng/mL — AB (ref <0.031)
Troponin I: 0.06 ng/mL — ABNORMAL HIGH (ref <0.031)
Troponin I: 0.06 ng/mL — ABNORMAL HIGH (ref <0.031)

## 2015-08-07 LAB — CBC
HEMATOCRIT: 29.9 % — AB (ref 36.0–46.0)
Hemoglobin: 9.6 g/dL — ABNORMAL LOW (ref 12.0–15.0)
MCH: 28.5 pg (ref 26.0–34.0)
MCHC: 32.1 g/dL (ref 30.0–36.0)
MCV: 88.7 fL (ref 78.0–100.0)
PLATELETS: 377 10*3/uL (ref 150–400)
RBC: 3.37 MIL/uL — ABNORMAL LOW (ref 3.87–5.11)
RDW: 14.6 % (ref 11.5–15.5)
WBC: 11.3 10*3/uL — AB (ref 4.0–10.5)

## 2015-08-07 LAB — BASIC METABOLIC PANEL
ANION GAP: 13 (ref 5–15)
BUN: 39 mg/dL — AB (ref 6–20)
CHLORIDE: 99 mmol/L — AB (ref 101–111)
CO2: 24 mmol/L (ref 22–32)
Calcium: 8 mg/dL — ABNORMAL LOW (ref 8.9–10.3)
Creatinine, Ser: 4.47 mg/dL — ABNORMAL HIGH (ref 0.44–1.00)
GFR calc Af Amer: 10 mL/min — ABNORMAL LOW (ref >60)
GFR, EST NON AFRICAN AMERICAN: 9 mL/min — AB (ref >60)
Glucose, Bld: 115 mg/dL — ABNORMAL HIGH (ref 65–99)
POTASSIUM: 3.4 mmol/L — AB (ref 3.5–5.1)
Sodium: 136 mmol/L (ref 135–145)

## 2015-08-07 LAB — PROTIME-INR
INR: 1.31 (ref 0.00–1.49)
Prothrombin Time: 16.4 seconds — ABNORMAL HIGH (ref 11.6–15.2)

## 2015-08-07 MED ORDER — ASPIRIN 81 MG PO CHEW
324.0000 mg | CHEWABLE_TABLET | Freq: Once | ORAL | Status: AC
  Administered 2015-08-07: 324 mg via ORAL
  Filled 2015-08-07: qty 4

## 2015-08-07 MED ORDER — MORPHINE SULFATE (PF) 2 MG/ML IV SOLN
2.0000 mg | INTRAVENOUS | Status: DC | PRN
  Administered 2015-08-07: 2 mg via INTRAVENOUS
  Filled 2015-08-07: qty 1

## 2015-08-07 MED ORDER — SODIUM CHLORIDE 0.9 % IV SOLN: 250.0000 mL | INTRAVENOUS | Status: DC | PRN

## 2015-08-07 MED ORDER — SODIUM CHLORIDE 0.9% FLUSH: 3.0000 mL | INTRAVENOUS | Status: DC | PRN

## 2015-08-07 MED ORDER — ASPIRIN 81 MG PO CHEW
81.0000 mg | CHEWABLE_TABLET | ORAL | Status: AC
  Administered 2015-08-08: 81 mg via ORAL
  Filled 2015-08-07: qty 1

## 2015-08-07 MED ORDER — TECHNETIUM TO 99M ALBUMIN AGGREGATED
4.1000 | Freq: Once | INTRAVENOUS | Status: AC | PRN
  Administered 2015-08-07: 4 via INTRAVENOUS

## 2015-08-07 MED ORDER — ALBUTEROL SULFATE (2.5 MG/3ML) 0.083% IN NEBU
2.5000 mg | INHALATION_SOLUTION | RESPIRATORY_TRACT | Status: DC | PRN
  Administered 2015-08-07 – 2015-08-08 (×2): 2.5 mg via RESPIRATORY_TRACT
  Filled 2015-08-07 (×2): qty 3

## 2015-08-07 MED ORDER — TECHNETIUM TC 99M DIETHYLENETRIAME-PENTAACETIC ACID: 30.5000 | Freq: Once | INTRAVENOUS | Status: DC | PRN

## 2015-08-07 MED ORDER — ALUM & MAG HYDROXIDE-SIMETH 200-200-20 MG/5ML PO SUSP
30.0000 mL | Freq: Four times a day (QID) | ORAL | Status: DC | PRN
  Administered 2015-08-07: 30 mL via ORAL
  Filled 2015-08-07: qty 30

## 2015-08-07 MED ORDER — NITROGLYCERIN 0.4 MG SL SUBL
SUBLINGUAL_TABLET | SUBLINGUAL | Status: AC
  Administered 2015-08-07: 0.4 mg
  Filled 2015-08-07: qty 1

## 2015-08-07 MED ORDER — SODIUM CHLORIDE 0.9 % IV SOLN
INTRAVENOUS | Status: DC
  Administered 2015-08-08: 06:00:00 via INTRAVENOUS

## 2015-08-07 MED ORDER — MORPHINE SULFATE (PF) 2 MG/ML IV SOLN
2.0000 mg | Freq: Once | INTRAVENOUS | Status: AC
  Administered 2015-08-07: 2 mg via INTRAVENOUS
  Filled 2015-08-07: qty 1

## 2015-08-07 MED ORDER — GUAIFENESIN-DM 100-10 MG/5ML PO SYRP
5.0000 mL | ORAL_SOLUTION | ORAL | Status: DC | PRN
  Administered 2015-08-07 – 2015-08-15 (×20): 5 mL via ORAL
  Filled 2015-08-07 (×20): qty 5

## 2015-08-07 MED ORDER — SODIUM CHLORIDE 0.9% FLUSH
3.0000 mL | Freq: Two times a day (BID) | INTRAVENOUS | Status: DC
  Administered 2015-08-07: 3 mL via INTRAVENOUS

## 2015-08-07 MED ORDER — NITROGLYCERIN 0.4 MG SL SUBL
0.4000 mg | SUBLINGUAL_TABLET | SUBLINGUAL | Status: DC | PRN
  Administered 2015-08-07 (×3): 0.4 mg via SUBLINGUAL
  Filled 2015-08-07: qty 1

## 2015-08-07 NOTE — Care Management Important Message (Signed)
Important Message  Patient Details  Name: [name] MRN: [id number] Date of Birth: 1934/05/03   Medicare Important Message Given:  Yes    [name] Abena 08/07/2015, 10:26 AM

## 2015-08-07 NOTE — Consult Note (Signed)
Cardiology Consult    Patient ID: SHIREKA TAK MRN: [id number], DOB/AGE: 11-24-1934   Admit date: 08/01/2015 Date of Consult: 08/07/2015  Primary Physician: Sherrie Mustache, MD Primary Cardiologist: Dr. Percival Spanish Requesting Provider: Dr. Maryland Pink IM  Patient Profile    81 yo female with PMH of CAD s/p DES to the mid RCA, 50% prox LAD, and 50% narrowing of the marg branch of the Circumflex in 2008/HTN/s/p AAA repair 2014/HLD/Hypothyroidism/CKD IV and anemia.   Past Medical History   Past Medical History  Diagnosis Date  . CAD (coronary artery disease)     50% proximal LAD stenosis, 50% marginal stenosis of the circumflex, RCA 95% stenosis, EF 40% stenosis with hypokinesis of the interior wall.  She had a Taxus stent placed in 04/16/2006  . Hypertension   . Abdominal aortic aneurysm ([place])   . Renal artery stenosis ([facility])     S/P stenting of the right renal atrophic left renal  . Dyslipidemia   . Hypothyroidism   . Tobacco user     Previous  . Adenomatous polyp 2004, 2011  . Hyperplastic colon polyp 2011  . Hemorrhoids   . Diverticulosis   . Renal insufficiency   . Anemia   . Myocardial infarction ([place])     stent 2008  . Constipation   . Cataract   . AAA (abdominal aortic aneurysm) [facility])     Past Surgical History  Procedure Laterality Date  . Abdominal hysterectomy    . Carpal tunnel release Bilateral   . Cholecystectomy    . Flexible sigmoidoscopy  02/25/2012    Procedure: FLEXIBLE SIGMOIDOSCOPY;  Surgeon: Inda Castle, MD;  Location: [facility] ENDOSCOPY;  Service: Endoscopy;  Laterality: N/A;  . Hemorrhoid banding  02/25/2012    Procedure: HEMORRHOID BANDING;  Surgeon: Inda Castle, MD;  Location: [facility] ENDOSCOPY;  Service: Endoscopy;  Laterality: N/A;  . Abdominal angiogram  12/21/2012    AAA  . Abdominal aortic endovascular stent graft N/A 12/22/2012    Procedure: ABDOMINAL AORTIC ENDOVASCULAR STENT GRAFT- GORE;  Surgeon: Mal Misty, MD;  Location: [place];   Service: Vascular;  Laterality: N/A;  Ultrasound guided  . Abdominal angiogram N/A 11/30/2012    Procedure: ABDOMINAL ANGIOGRAM;  Surgeon: Serafina Mitchell, MD;  Location: [facility] CATH LAB;  Service: Cardiovascular;  Laterality: N/A;  . Cataract extraction, bilateral    . Colonoscopy    . Polypectomy    . Cardiac catheterization  2008  . Av fistula placement Left 04/20/2015    Procedure: CREATION OF LEFT UPPER ARM BRACHIOCEPHALIC ARTERIOVENOUS (AV) FISTULA  ;  Surgeon: Mal Misty, MD;  Location: [place];  Service: Vascular;  Laterality: Left;     Allergies  Allergies  Allergen Reactions  . Codeine Rash  . Sulfonamide Derivatives Hives    History of Present Illness    81 yo female patient of Dr. Rosezella [place] with PMH of CAD s/p DES to the mid RCA, 50% prox LAD, and 50% narrowing of the marg branch of the Circumflex in 2008/HTN/s/p AAA repair 2014/HLD/Hypothyroidism/CKD IV and anemia. Her last visit to the office was 08/2013 where she was noted to be in her normal state of health with no changes made to her medical therapy.   She presented to the [facility] ED on 08/01/2015 c/o "cold" like symptoms that she was seen at her PCP for and given antibiotics. In the ED she was noted to the afebrile, but hypertensive with an O2 level that dropped when attempted to  wean off of O2.  She was ultimately admitted to Internal Medicine service for hypoxia. On admission she had an elevated trop of 0.22 which was cycled with a negative trend. She was started on Iv antibiotics and nephrology was consulted in relation to dosing high dose lasix given her hx of CKD IV. Throughout the course of admission she has received multiple chest x-ray suggesting mild pulmonary edema that had persisted despite receiving lasix. Also received a VQ scan which was negative for PE, but continues to require 2L [place].   Repeat echo was obtained during this admission and noted a drop in her EF of 45-50% from 60-65% in 2012. She has also developed  reports of left sided chest pain that started last night while she was coughing. Family reports that she has been coughing right much during this admission, and the patient has complained of being sore in her chest. A trop was drawn today showing 0.06, WBC with slight trend upwards to 11.3, Cr with slight improvement from 5.08>>4.47 with 80mg  IV Lasix BID. Net UOP since admission is around 6L, but weight is only down 1lb. In talking with the patient she reports feeling better yesterday afternoon, even to the point she was able to walk in the hallway. Today she appears very dyspnea even at rest, continuing to require 2L [place].   Cardiology has been consulted in relation to patients report of chest pain.   Inpatient Medications    . azithromycin  500 mg Oral q1800  . cefpodoxime  200 mg Oral Daily  . citalopram  20 mg Oral Daily  . furosemide  80 mg Intravenous BID  . heparin  5,000 Units Subcutaneous Q8H  . levothyroxine  50 mcg Oral QAC breakfast  . linaclotide  145 mcg Oral Daily  . NIFEdipine  60 mg Oral Daily  . pantoprazole  40 mg Oral Daily  . pravastatin  80 mg Oral QHS  . saccharomyces boulardii  250 mg Oral BID  . sodium chloride flush  3 mL Intravenous Q12H  . sodium chloride flush  3 mL Intravenous Q12H    Family History    Family History  Problem Relation Age of Onset  . Uterine cancer Mother   . Heart attack Father 27  . Heart disease Father     before age 23  . Peripheral vascular disease Sister   . Other Sister     Renal artery stenosis  . Heart disease Sister     before age 37  . Heart attack Brother 52  . Heart disease Brother     before age 17  . Lung cancer Brother     \  . Colon cancer Neg Hx   . Breast cancer Maternal Aunt     x2    Social History    Social History   Social History  . Marital Status: Married    Spouse Name: N/A  . Number of Children: 4  . Years of Education: N/A   Occupational History  . Works at a [employer] in  [place]  . Smoking status: Former Smoker -- 1 years    Types: Cigarettes    Quit date: 03/17/2006  . Smokeless tobacco: Never Used  . Alcohol Use: No  . Drug Use: No  . Sexual Activity: Not on file   Other Topics Concern  . Not on file   Social History Narrative  Review of Systems    General:  No chills, fever, night sweats or weight changes.  Cardiovascular:  + chest pain, + dyspnea on exertion, edema, + orthopnea, palpitations, paroxysmal nocturnal dyspnea. Dermatological: No rash, lesions/masses Respiratory: No cough, + dyspnea Urologic: No hematuria, dysuria Abdominal:   No nausea, vomiting, diarrhea, bright red blood per rectum, melena, or hematemesis Neurologic:  No visual changes, wkns, changes in mental status, + fatigue All other systems reviewed and are otherwise negative except as noted above.  Physical Exam    Blood pressure 116/89, pulse 89, temperature 98 F (36.7 C), temperature source Oral, resp. rate 21, weight 133 lb 1.6 oz (60.374 kg), SpO2 98 %.  General: Pleasant frail older female, increased work of breathing Psych: Blunted affect. Neuro: Alert and oriented X 3. Moves all extremities spontaneously. HEENT: Normal  Neck: Supple without bruits or JVD. Lungs:  Resp regular, but labored, diminished in the lower lobes, using some accessory muscles. Heart: RRR no s3, s4, or murmurs. Abdomen: Soft, non-tender, non-distended, BS + x 4.  Extremities: No clubbing, cyanosis or edema. DP/PT/Radials 2+ and equal bilaterally. LUA Fistula +bruit.   Labs     Recent Labs  08/07/15 0826  TROPONINI 0.06*   Lab Results  Component Value Date   WBC 11.3* 08/07/2015   HGB 9.6* 08/07/2015   HCT 29.9* 08/07/2015   MCV 88.7 08/07/2015   PLT 377 08/07/2015    Recent Labs Lab 08/07/15 0250  NA 136  K 3.4*  CL 99*  CO2 24  BUN 39*  CREATININE 4.47*  CALCIUM 8.0*  GLUCOSE 115*     Radiology Studies    Nm  Pulmonary Perf And Vent  08/07/2015  CLINICAL DATA:  Chest pain.  Shortness of breath. EXAM: NUCLEAR MEDICINE VENTILATION - PERFUSION LUNG SCAN TECHNIQUE: Ventilation images were obtained in multiple projections using inhaled aerosol Tc-84m DTPA. Perfusion images were obtained in multiple projections after intravenous injection of Tc-8m MAA. RADIOPHARMACEUTICALS:  30.5 mCi Technetium-109m DTPA aerosol inhalation and 4.1 mCi Technetium-53m MAA IV COMPARISON:  Chest x-ray 08/07/2015. FINDINGS: Extremely poor ventilation bilaterally consistent with previously identified bilateral pulmonary edema and/or pneumonia. No prominent perfusion defects noted. Tiny perfusion defects noted are smaller than ventilatory defects. IMPRESSION: Low probability pulmonary embolus. Electronically Signed   By: Marcello Moores  Register   On: 08/07/2015 12:33   Dg Chest Port 1 View  08/07/2015  CLINICAL DATA:  Acute onset of generalized chest pain. Initial encounter. EXAM: PORTABLE CHEST 1 VIEW COMPARISON:  Chest radiograph performed 08/06/2015 FINDINGS: The lungs are mildly hypoexpanded. Patchy bilateral airspace opacities may reflect mild pulmonary edema or pneumonia, superimposed on chronic changes. No pleural effusion or pneumothorax is seen. The cardiomediastinal silhouette is mildly enlarged. No acute osseous abnormalities are seen. IMPRESSION: Lungs mildly hypoexpanded. Patchy bilateral airspace opacities may reflect mild pulmonary edema or pneumonia, superimposed on chronic changes. Mild cardiomegaly. Electronically Signed   By: Garald Balding M.D.   On: 08/07/2015 05:09    ECG & Cardiac Imaging    EKG: SR Rate-94 PVCs, no acute ST/T wave abnormalities  Echo: 08/02/2015  Study Conclusions  - Left ventricle: The cavity size was normal. Wall thickness was  increased in a pattern of mild LVH. Systolic function was mildly  reduced. The estimated ejection fraction was in the range of 45%  to 50%. Doppler parameters are  consistent with abnormal left  ventricular relaxation (grade 1 diastolic dysfunction). - Right atrium: The atrium was moderately dilated. - Pulmonary arteries: Systolic pressure was  moderately increased.  PA peak pressure: 41 mm Hg (S).  Assessment & Plan    1. Dyspnea: Presented to the ED on 08/01/2015 with reports of SOB and "cold" like symptoms that she was given antibiotics for by PCP. Admitted through Internal Medicine, when O2 sats fell after attempts to wean in the ED. During admission she has been diuresed with nephrology on 80mg  IV Lasix BID around 6.7L, but continues to be dyspneic. Received multiple chest x-rays reporting mild edema/ pneumonia. Is currently being treated with PO antibiotics. Today she reported left sided chest pain that started last night after coughing. Has been coughing since admission secondary to infection, but her chest has since become sore.  --In talking with the patient she reports more dyspnea than actual chest pain. Was feeling better yesterday and even about to ambulate in the hallway. States this morning the dyspnea returned while she was sitting in bed, and has remained throughout the day.  --On exam she has diminished lung sounds in the lower lobes, but no crackles, and minimal if any edema in the LE. She is using some accessory muscles.  --I question if this has more of a respiratory component given yesterdays x-ray suggest chronic bronchitic changes. Not currently receiving any IV steroids, I question if this may be beneficial.  2. Chest pain: Reports left sided chest pain which started last night after she had been coughing for a period of time. --She does was CAD with known persist disease following her LHC in 2008, there could be worsening of this, but given her current respiratory state, and CKD I do not think that she is a LHC candidate at this time.  --Stress testing maybe be beneficial, but she is currently dyspneic and I don't think she would  tolerate testing.  --2D echo shows a decrease in EF from 60-65% in 2012 to 45-50% 08/02/5015, did have an elevated PA pressure of 41  3. Acute CHF: On admission BNP was 3396, placed on 80mg  IV Lasix BID with nephrology following. Net UOP since admission around 6.7L -- Lungs slightly diminished in the lower lobes, but no crackles noted. Minimal LEE.  --Would continue with diuresis for now with nephrology recommendations.   4. CKD IV: Has improved since admission, currently Cr 4.47 from 5.0. Has a LUA fistula but has not began dialysis at this time. Following trend.   --Dr. Sallyanne Kuster to see patient.    Barnet Pall, NP-C Pager [phone] 08/07/2015, 2:26 PM  I have seen and examined the patient along with Reino Bellis, NP-C.  I have reviewed the chart, notes and new data.  I agree with NP's note.  Key new complaints: chest pain is sharp, but not clearly pleuritic or positional; she has significant dyspnea at rest, but has no orthopnea whatsoever - she is just as short of breath sitting up as she is lying fully flat. Key examination changes: unusual scratchy systolic sound heard in pulmonary focus, no pericardial rub ; 2/6 holosystolic murmur at left lower sternal border. Distinct S4, no S3. Key new findings / data: personally reviewed her echo images. There is a clear early relaxation pattern in the mitral inflow and the E/e' ratio is in the gray zone. RV dysfunction is a much more prominent abnormality than the minor degree of LV dysfunction. There is even a hint of McConnell's sign. Right atrial dilation dominates the degree of left atrial dilation. The inferior vena cava is plethoric.  PLAN:  Hypoxic respiratory failure, of uncertain pulmonary versus  cardiac etiology. Overall, there is more compelling evidence for pulmonary disease and right ventricular dysfunction, with at most equivocal evidence for left heart failure. Further evaluation and treatment choices are  significantly impacted by severe renal disease. Before we proceed with more aggressive diuresis, I recommend we clarify her volume status with a right heart catheterization (from the femoral approach, to avoid compromise to dialysis access). This procedure has been fully reviewed with the patient and written informed consent has been obtained.    Sanda Klein, MD, [facility] [phone] 08/07/2015, 4:40 PM

## 2015-08-07 NOTE — Progress Notes (Signed)
[facility] PROGRESS NOTE  KANITA GUTEKUNST [id number] DOB: Sep 25, 1934 DOA: 08/01/2015  PCP: Sherrie Mustache, MD  Brief HPI: 81 year old Caucasian female with a past medical history of coronary artery disease, hypertension, hyperlipidemia, hypothyroidism, anemia, presented with complaints of shortness of breath. She was recently seen by her PCP a few days ago and was started on antibiotics for a sinus infection. Patient's symptoms worsened and so she presented to the emergency department and was hospitalized for further management. Patient was initially started on IV antibiotics. She was slow to improve. Her creatinine worsened. Chest x-ray showed persistent infiltrates. She was given high dose Lasix. Nephrology was consulted. Patient however did not improve as anticipated. Cardiology was subsequently consulted.  Past medical history:  Past Medical History  Diagnosis Date  . CAD (coronary artery disease)     50% proximal LAD stenosis, 50% marginal stenosis of the circumflex, RCA 95% stenosis, EF 40% stenosis with hypokinesis of the interior wall.  She had a Taxus stent placed in 04/16/2006  . Hypertension   . Abdominal aortic aneurysm ([place])   . Renal artery stenosis (HCC)     S/P stenting of the right renal atrophic left renal  . Dyslipidemia   . Hypothyroidism   . Tobacco user     Previous  . Adenomatous polyp 2004, 2011  . Hyperplastic colon polyp 2011  . Hemorrhoids   . Diverticulosis   . Renal insufficiency   . Anemia   . Myocardial infarction ([place])     stent 2008  . Constipation   . Cataract   . AAA (abdominal aortic aneurysm) [facility])     Consultants: Nephrology. Cardiology  Procedures:  Transthoracic echocardiogram Study Conclusions - Left ventricle: The cavity size was normal. Wall thickness was increased in a pattern of mild LVH. Systolic function was mildly reduced. The estimated ejection fraction was in the range of 45% to 50%. Doppler parameters are  consistent with abnormal left ventricular relaxation (grade 1 diastolic dysfunction). - Right atrium: The atrium was moderately dilated. - Pulmonary arteries: Systolic pressure was moderately increased. PA peak pressure: 41 mm Hg (S).   Antibiotics: Ceftriaxone and azithromycin till 5/20 Vantin 5/20  Subjective: Patient is very short of breath this morning. Also complains of left-sided chest pain radiating to the back. Continues to have a cough. Her daughter is at the bedside.   Objective:  Vital Signs  Filed Vitals:   08/06/15 1411 08/06/15 1421 08/06/15 2111 08/07/15 0811  BP: 125/63 153/66 124/64 145/78  Pulse: 88 86 99 84  Temp: 99.5 F (37.5 C) 98 F (36.7 C) 98.5 F (36.9 C) 97.8 F (36.6 C)  TempSrc: Oral Oral Oral Oral  Resp: 18 18 18    Weight:      SpO2: 98% 100% 93% 94%    Intake/Output Summary (Last 24 hours) at 08/07/15 0915 Last data filed at 08/07/15 0100  Gross per 24 hour  Intake    480 ml  Output   2876 ml  Net  -2396 ml   Filed Weights   08/04/15 0545 08/05/15 0552 08/06/15 0634  Weight: 60.963 kg (134 lb 6.4 oz) 60.963 kg (134 lb 6.4 oz) 60.374 kg (133 lb 1.6 oz)    General appearance: Alert. Appears to be fatigued this morning. No distress.  Resp: Crackles present bilaterally. Not much improvement compared to yesterday. No wheezing.   Cardio: regular rate and rhythm, S1, S2 normal, no murmur, click, rub or gallop. No pedal edema GI: soft, non-tender; bowel sounds normal;  no masses,  no organomegaly Neurologic: Awake and alert. Oriented 3. No focal neurological deficits.  Lab Results:  Data Reviewed: I have personally reviewed following labs and imaging studies  CBC:  Recent Labs Lab 08/01/15 1231 08/03/15 0530 08/04/15 0332 08/05/15 0339 08/07/15 0250  WBC 11.1* 9.9 10.0 10.1 11.3*  HGB 8.2* 7.4* 8.5* 8.7* 9.6*  HCT 24.8* 22.9* 26.9* 27.5* 29.9*  MCV 93.6 91.6 89.4 89.3 88.7  PLT 300 307 293 315 [id number]   Basic Metabolic  Panel:  Recent Labs Lab 08/03/15 0530 08/04/15 0332 08/05/15 0339 08/06/15 0300 08/07/15 0250  NA 139 138 137 138 136  K 3.0* 2.9* 2.8* 3.2* 3.4*  CL 103 102 103 103 99*  CO2 22 22 22 23 24   GLUCOSE 118* 104* 107* 100* 115*  BUN 34* 33* 35* 37* 39*  CREATININE 5.05* 5.09* 5.08* 4.85* 4.47*  CALCIUM 7.4* 7.2* 7.3* 7.6* 8.0*   GFR: Estimated Creatinine Clearance: 9.3 mL/min (by C-G formula based on Cr of 4.47).  Cardiac Enzymes:  Recent Labs Lab 08/01/15 2241 08/02/15 0839 08/07/15 0826  TROPONINI 0.28* 0.18* 0.06*     Recent Results (from the past 240 hour(s))  Culture, blood (routine x 2) Call MD if unable to obtain prior to antibiotics being given     Status: None   Collection Time: 08/01/15  2:52 PM  Result Value Ref Range Status   Specimen Description BLOOD RIGHT HAND  Final   Special Requests IN PEDIATRIC BOTTLE [place]  Final   Culture NO GROWTH 5 DAYS  Final   Report Status 08/06/2015 FINAL  Final  Culture, blood (routine x 2) Call MD if unable to obtain prior to antibiotics being given     Status: None   Collection Time: 08/01/15  2:57 PM  Result Value Ref Range Status   Specimen Description BLOOD RIGHT ANTECUBITAL  Final   Special Requests IN PEDIATRIC BOTTLE 1CC  Final   Culture NO GROWTH 5 DAYS  Final   Report Status 08/06/2015 FINAL  Final  Culture, sputum-assessment     Status: None   Collection Time: 08/02/15  5:27 AM  Result Value Ref Range Status   Specimen Description EXPECTORATED SPUTUM  Final   Special Requests NONE  Final   Sputum evaluation   Final    THIS SPECIMEN IS ACCEPTABLE. RESPIRATORY CULTURE REPORT TO FOLLOW.   Report Status 08/02/2015 FINAL  Final  Culture, respiratory (NON-Expectorated)     Status: None   Collection Time: 08/02/15  7:28 AM  Result Value Ref Range Status   Specimen Description SPUTUM  Final   Special Requests NONE  Final   Gram Stain   Final    FEW WBC FEW SQUAMOUS EPITHELIAL CELLS PRESENT FEW GRAM POSITIVE  COCCI IN PAIRS Performed at [facility]    Culture   Final    NORMAL OROPHARYNGEAL FLORA Performed at [facility]    Report Status 08/05/2015 FINAL  Final      Radiology Studies: Dg Chest 2 View  08/06/2015  CLINICAL DATA:  Onset of dyspnea today; history of coronary artery disease, CHF, severe chronic renal insufficiency, former smoker. EXAM: CHEST  2 VIEW COMPARISON:  Chest x-ray dated Aug 03, 2015. FINDINGS: The right hemidiaphragm remains elevated. The pulmonary interstitial markings remain increased and any areas demonstrate confluence. These lung markings are slightly more conspicuous than on the previous study. The cardiac silhouette remains enlarged. The ascending and descending thoracic aorta as remain tortuous. A large hiatal hernia  is present. There is no pleural effusion or pneumothorax. An abdominal aortic stent graft is partially visible in the field of view. The observed bony thorax is unremarkable. IMPRESSION: 1. Persistent increased interstitial markings consistent with mild pulmonary interstitial edema superimposed upon chronic bronchitic changes. Stable cardiomegaly. Stable tortuosity of the ascending and descending thoracic aorta. 2. Areas of confluent density in the right upper lobe likely reflects early pneumonia though pulmonary edema could produce similar findings. 3. Moderate-sized hiatal hernia containing an air-fluid level. Electronically Signed   By: David  Martinique M.D.   On: 08/06/2015 08:49   Dg Chest Port 1 View  08/07/2015  CLINICAL DATA:  Acute onset of generalized chest pain. Initial encounter. EXAM: PORTABLE CHEST 1 VIEW COMPARISON:  Chest radiograph performed 08/06/2015 FINDINGS: The lungs are mildly hypoexpanded. Patchy bilateral airspace opacities may reflect mild pulmonary edema or pneumonia, superimposed on chronic changes. No pleural effusion or pneumothorax is seen. The cardiomediastinal silhouette is mildly enlarged. No acute osseous  abnormalities are seen. IMPRESSION: Lungs mildly hypoexpanded. Patchy bilateral airspace opacities may reflect mild pulmonary edema or pneumonia, superimposed on chronic changes. Mild cardiomegaly. Electronically Signed   By: Garald Balding M.D.   On: 08/07/2015 05:09     Medications:  Scheduled: . azithromycin  500 mg Oral q1800  . cefpodoxime  200 mg Oral Daily  . citalopram  20 mg Oral Daily  . furosemide  80 mg Intravenous BID  . heparin  5,000 Units Subcutaneous Q8H  . levothyroxine  50 mcg Oral QAC breakfast  . linaclotide  145 mcg Oral Daily  . NIFEdipine  60 mg Oral Daily  . pantoprazole  40 mg Oral Daily  . pravastatin  80 mg Oral QHS  . saccharomyces boulardii  250 mg Oral BID  . sodium chloride flush  3 mL Intravenous Q12H  . sodium chloride flush  3 mL Intravenous Q12H   Continuous:  [id number] chloride, acetaminophen, albuterol, alum & mag hydroxide-simeth, bisacodyl, fluticasone, guaiFENesin-dextromethorphan, meclizine, morphine injection, nitroGLYCERIN, ondansetron **OR** ondansetron (ZOFRAN) IV, sodium chloride flush, traMADol, traZODone  Assessment/Plan:  Principal Problem:   Hypoxia Active Problems:   Coronary atherosclerosis   AAA (abdominal aortic aneurysm) (HCC)   Hypothyroidism   Chronic kidney disease (CKD), stage IV (severe) (HCC)   CAP (community acquired pneumonia)   Anemia   Elevated troponin   Congestive heart failure (HCC)   Acute renal failure superimposed on stage 4 chronic kidney disease (HCC)   Acute systolic CHF (congestive heart failure) ([place])    Left-sided chest pain This is a new symptom for this patient. Could be related to her fluid overload. EKG did not show any acute ischemic changes. Troponin is 0.06. She states that the pain radiates to the back. Recent echocardiogram showed normal aortic root and normal ascending aorta. VQ scan will be ordered. If negative, we will consult cardiology. Patient's blood pressure, heart rate is stable  at this time. She is saturating normal with nasal cannula oxygen.  Dyspnea, cough and fever/possible community-acquired pneumonia/Fluid overload/Acute respiratory failure with hypoxia Patient appears to be worse this morning. She however, continues to diurese well. Weight is decreasing slowly. Continue current management for now with diuretics and oral antibiotics. Chest x-ray was repeated without much change in her infiltrates. Infection was also considered a possibility and so she has been on antibiotics. Initially on IV antibiotics and now on oral Vantin. Cultures are all negative. Strep antigen was negative. Influenza PCR was negative. Continues to require 2 L of oxygen by nasal  cannula.  Acute Systolic CHF Echocardiogram shows reduced EF. This is new compared to previous echocardiograms. She does have a history of CAD, as discussed above. She had a negative stress test in 2014. Cardiology to see, as discussed above.   Acute on Chronic kidney disease stage IV Creatinine appears to be improving now. Appreciate nephrology assistance. Continue IV Lasix. Patient is followed by Dr. Florene Glen with [facility]. Creatinine was 4.56 at the time of admission. Back in May 2016, it was 3.56. She recently had a left AV fistula placed in preparation for dialysis, although she is not on dialysis yet. Apparently she is on a transplant list at [facility].  History of coronary artery disease with mildly elevated troponin She is status post intervention in 2008. She denies any chest pain currently. Minimal elevation in troponin is noted, most likely due to demand ischemia and renal failure. Continue to monitor clinically. Continue home medications. Echocardiogram does show reduction in ejection fraction. Previous cardiology notes reviewed. She had a stress test in 2012, which did not show any ischemia. She also had a stress test in 2014 also normal and had normal EF at that time.   Normocytic  anemia. Likely due to chronic disease. It was felt that her anemia could be contributing to her symptoms as well. So she was transfused 2 units of blood on 5/19. Hemoglobin improved and stable.  History of AAA, status post graft repair several years ago. Stable.  History of hypothyroidism. Continue home medications  DVT Prophylaxis: Subcutaneous heparin    Code Status: Full code  Family Communication: Discussed with the patient and her daughter Disposition Plan: Patient appears to be worse this morning. Reason for this is not entirely clear. She does have chest pain. Cardiology has been consulted. VQ scan is low probability. Continue to monitor closely.     LOS: 5 days   [facility] Pager [phone] 08/07/2015, 9:15 AM  If 7PM-7AM, please contact night-coverage at [email or web address], password [facility]

## 2015-08-07 NOTE — Progress Notes (Signed)
Utilization review completed.  

## 2015-08-07 NOTE — Progress Notes (Signed)
Patient ID: [name], female   DOB: July 04, 1934, 81 y.o.   MRN: [id number]  [facility] Progress Note   Assessment/ Plan:   1. AKI on chronic kidney disease stage V (baseline creatinine 3.6-3.9). Suspected to be hemodynamically mediated by acute exacerbation of diastolic heart failure. Fortunately, she continues to respond to diuretic therapy with improvement of renal function but without indications for acute dialysis as yet. (She has a left BCF). Continue furosemide at 80 mg IV twice a day rather than decreasing given her increased shortness of breath. Will empirically check UA again. 2. Acute exacerbation of diastolic heart failure: Clinically improving with ongoing diuretic therapy-with net negative fluid balance noted however, worsening clinically. VQ scan to evaluate for PE. 3. Hypokalemia: Secondary to renal wasting from recent increase in diuretic therapy-replace cautiously via oral route. 4. Anemia: With iron deficiency (iron saturation 5% with ferritin 52), status post 2 units PRBC. Status post intravenous iron yesterday. 5. Possible community-acquired pneumonia: On antimicrobial coverage with azithromycin/cefpodoxime.  Subjective:   Reports to be feeling poorly this morning with increased shortness of breath and feeling hot/sweaty. Denies any dysuria or chest pain. Chest x-ray reviewed, awaiting VQ scan.    Objective:   BP 145/78 mmHg  Pulse 84  Temp(Src) 97.8 F (36.6 C) (Oral)  Resp 18  Wt 60.374 kg (133 lb 1.6 oz)  SpO2 94%  Intake/Output Summary (Last 24 hours) at 08/07/15 1016 Last data filed at 08/07/15 0100  Gross per 24 hour  Intake    480 ml  Output   2876 ml  Net  -2396 ml   Weight change:   Physical Exam: [id number] uncomfortable resting in bed CVS: Pulse regular rhythm, normal rate Resp: Poor inspiratory effort with fine rales left base otherwise clear Abd: Soft, flat, nontender Ext: Trace ankle edema. Left brachiocephalic fistula with  pulsatile thrill.  Imaging: Dg Chest 2 View  08/06/2015  CLINICAL DATA:  Onset of dyspnea today; history of coronary artery disease, CHF, severe chronic renal insufficiency, former smoker. EXAM: CHEST  2 VIEW COMPARISON:  Chest x-ray dated Aug 03, 2015. FINDINGS: The right hemidiaphragm remains elevated. The pulmonary interstitial markings remain increased and any areas demonstrate confluence. These lung markings are slightly more conspicuous than on the previous study. The cardiac silhouette remains enlarged. The ascending and descending thoracic aorta as remain tortuous. A large hiatal hernia is present. There is no pleural effusion or pneumothorax. An abdominal aortic stent graft is partially visible in the field of view. The observed bony thorax is unremarkable. IMPRESSION: 1. Persistent increased interstitial markings consistent with mild pulmonary interstitial edema superimposed upon chronic bronchitic changes. Stable cardiomegaly. Stable tortuosity of the ascending and descending thoracic aorta. 2. Areas of confluent density in the right upper lobe likely reflects early pneumonia though pulmonary edema could produce similar findings. 3. Moderate-sized hiatal hernia containing an air-fluid level. Electronically Signed   By: David  Martinique M.D.   On: 08/06/2015 08:49   Dg Chest Port 1 View  08/07/2015  CLINICAL DATA:  Acute onset of generalized chest pain. Initial encounter. EXAM: PORTABLE CHEST 1 VIEW COMPARISON:  Chest radiograph performed 08/06/2015 FINDINGS: The lungs are mildly hypoexpanded. Patchy bilateral airspace opacities may reflect mild pulmonary edema or pneumonia, superimposed on chronic changes. No pleural effusion or pneumothorax is seen. The cardiomediastinal silhouette is mildly enlarged. No acute osseous abnormalities are seen. IMPRESSION: Lungs mildly hypoexpanded. Patchy bilateral airspace opacities may reflect mild pulmonary edema or pneumonia, superimposed on chronic changes. Mild  cardiomegaly. Electronically Signed   By: Garald Balding M.D.   On: 08/07/2015 05:09    Labs: BMET  Recent Labs Lab 08/01/15 1231 08/03/15 0530 08/04/15 0332 08/05/15 0339 08/06/15 0300 08/07/15 0250  NA 137 139 138 137 138 136  K 3.5 3.0* 2.9* 2.8* 3.2* 3.4*  CL 104 103 102 103 103 99*  CO2 20* 22 22 22 23 24   GLUCOSE 106* 118* 104* 107* 100* 115*  BUN 35* 34* 33* 35* 37* 39*  CREATININE 4.56* 5.05* 5.09* 5.08* 4.85* 4.47*  CALCIUM 7.6* 7.4* 7.2* 7.3* 7.6* 8.0*   CBC  Recent Labs Lab 08/03/15 0530 08/04/15 0332 08/05/15 0339 08/07/15 0250  WBC 9.9 10.0 10.1 11.3*  HGB 7.4* 8.5* 8.7* 9.6*  HCT 22.9* 26.9* 27.5* 29.9*  MCV 91.6 89.4 89.3 88.7  PLT 307 293 315 377   Medications:    . azithromycin  500 mg Oral q1800  . cefpodoxime  200 mg Oral Daily  . citalopram  20 mg Oral Daily  . furosemide  80 mg Intravenous BID  . heparin  5,000 Units Subcutaneous Q8H  . levothyroxine  50 mcg Oral QAC breakfast  . linaclotide  145 mcg Oral Daily  . NIFEdipine  60 mg Oral Daily  . pantoprazole  40 mg Oral Daily  . pravastatin  80 mg Oral QHS  . saccharomyces boulardii  250 mg Oral BID  . sodium chloride flush  3 mL Intravenous Q12H  . sodium chloride flush  3 mL Intravenous Q12H   Elmarie Shiley, MD 08/07/2015, 10:16 AM

## 2015-08-08 ENCOUNTER — Encounter (HOSPITAL_COMMUNITY)
Admission: EM | Disposition: A | Discharge status: Home Health | Payer: Self-pay | Source: Home / Self Care | Attending: Internal Medicine

## 2015-08-08 ENCOUNTER — Encounter (HOSPITAL_COMMUNITY): Payer: Self-pay | Admitting: Internal Medicine

## 2015-08-08 ENCOUNTER — Inpatient Hospital Stay (HOSPITAL_COMMUNITY): Payer: Medicare HMO

## 2015-08-08 DIAGNOSIS — R7989 Other specified abnormal findings of blood chemistry: Secondary | ICD-10-CM

## 2015-08-08 DIAGNOSIS — R0600 Dyspnea, unspecified: Secondary | ICD-10-CM | POA: Insufficient documentation

## 2015-08-08 HISTORY — PX: CARDIAC CATHETERIZATION: SHX172

## 2015-08-08 LAB — POCT I-STAT 3, VENOUS BLOOD GAS (G3P V)
ACID-BASE DEFICIT: 2 mmol/L (ref 0.0–2.0)
Acid-base deficit: 2 mmol/L (ref 0.0–2.0)
BICARBONATE: 23.5 meq/L (ref 20.0–24.0)
Bicarbonate: 23.5 mEq/L (ref 20.0–24.0)
O2 SAT: 47 %
O2 SAT: 51 %
PCO2 VEN: 41.4 mmHg — AB (ref 45.0–50.0)
PH VEN: 7.365 — AB (ref 7.250–7.300)
TCO2: 25 mmol/L (ref 0–100)
TCO2: 25 mmol/L (ref 0–100)
pCO2, Ven: 41 mmHg — ABNORMAL LOW (ref 45.0–50.0)
pH, Ven: 7.362 — ABNORMAL HIGH (ref 7.250–7.300)
pO2, Ven: 27 mmHg — ABNORMAL LOW (ref 31.0–45.0)
pO2, Ven: 28 mmHg — ABNORMAL LOW (ref 31.0–45.0)

## 2015-08-08 LAB — URINALYSIS, ROUTINE W REFLEX MICROSCOPIC
Bilirubin Urine: NEGATIVE
Glucose, UA: NEGATIVE mg/dL
KETONES UR: NEGATIVE mg/dL
LEUKOCYTES UA: NEGATIVE
NITRITE: NEGATIVE
PROTEIN: 30 mg/dL — AB
Specific Gravity, Urine: 1.015 (ref 1.005–1.030)
pH: 5.5 (ref 5.0–8.0)

## 2015-08-08 LAB — POCT I-STAT 3, ART BLOOD GAS (G3+)
ACID-BASE DEFICIT: 5 mmol/L — AB (ref 0.0–2.0)
Bicarbonate: 19.9 mEq/L — ABNORMAL LOW (ref 20.0–24.0)
O2 SAT: 89 %
PO2 ART: 60 mmHg — AB (ref 80.0–100.0)
TCO2: 21 mmol/L (ref 0–100)
pCO2 arterial: 35.8 mmHg (ref 35.0–45.0)
pH, Arterial: 7.354 (ref 7.350–7.450)

## 2015-08-08 LAB — URINE MICROSCOPIC-ADD ON

## 2015-08-08 LAB — CBC
HEMATOCRIT: 27.4 % — AB (ref 36.0–46.0)
HEMOGLOBIN: 8.6 g/dL — AB (ref 12.0–15.0)
MCH: 28.5 pg (ref 26.0–34.0)
MCHC: 31.4 g/dL (ref 30.0–36.0)
MCV: 90.7 fL (ref 78.0–100.0)
Platelets: 353 10*3/uL (ref 150–400)
RBC: 3.02 MIL/uL — AB (ref 3.87–5.11)
RDW: 14.7 % (ref 11.5–15.5)
WBC: 17.9 10*3/uL — AB (ref 4.0–10.5)

## 2015-08-08 LAB — BASIC METABOLIC PANEL
ANION GAP: 13 (ref 5–15)
BUN: 43 mg/dL — ABNORMAL HIGH (ref 6–20)
CHLORIDE: 98 mmol/L — AB (ref 101–111)
CO2: 22 mmol/L (ref 22–32)
CREATININE: 4.67 mg/dL — AB (ref 0.44–1.00)
Calcium: 8.1 mg/dL — ABNORMAL LOW (ref 8.9–10.3)
GFR calc non Af Amer: 8 mL/min — ABNORMAL LOW (ref >60)
GFR, EST AFRICAN AMERICAN: 9 mL/min — AB (ref >60)
Glucose, Bld: 104 mg/dL — ABNORMAL HIGH (ref 65–99)
POTASSIUM: 4.2 mmol/L (ref 3.5–5.1)
SODIUM: 133 mmol/L — AB (ref 135–145)

## 2015-08-08 SURGERY — RIGHT HEART CATH
Anesthesia: LOCAL

## 2015-08-08 MED ORDER — SODIUM CHLORIDE 0.9 % IV SOLN: 250.0000 mL | INTRAVENOUS | Status: DC | PRN

## 2015-08-08 MED ORDER — ONDANSETRON HCL 4 MG/2ML IJ SOLN: 4.0000 mg | Freq: Four times a day (QID) | INTRAMUSCULAR | Status: DC | PRN

## 2015-08-08 MED ORDER — FUROSEMIDE 10 MG/ML IJ SOLN: 40.0000 mg | Freq: Two times a day (BID) | INTRAMUSCULAR | Status: DC

## 2015-08-08 MED ORDER — SODIUM CHLORIDE 0.9% FLUSH: 3.0000 mL | INTRAVENOUS | Status: DC | PRN

## 2015-08-08 MED ORDER — HEPARIN (PORCINE) IN NACL 2-0.9 UNIT/ML-% IJ SOLN
INTRAMUSCULAR | Status: DC | PRN
  Administered 2015-08-08: 1000 mL

## 2015-08-08 MED ORDER — HEPARIN SODIUM (PORCINE) 5000 UNIT/ML IJ SOLN
5000.0000 [IU] | Freq: Three times a day (TID) | INTRAMUSCULAR | Status: DC
  Administered 2015-08-08 – 2015-08-16 (×21): 5000 [IU] via SUBCUTANEOUS
  Filled 2015-08-08 (×22): qty 1

## 2015-08-08 MED ORDER — SODIUM CHLORIDE 0.9% FLUSH
3.0000 mL | Freq: Two times a day (BID) | INTRAVENOUS | Status: DC
  Administered 2015-08-08: 3 mL via INTRAVENOUS

## 2015-08-08 MED ORDER — DARBEPOETIN ALFA 60 MCG/0.3ML IJ SOSY
60.0000 ug | PREFILLED_SYRINGE | INTRAMUSCULAR | Status: DC
  Administered 2015-08-08: 60 ug via SUBCUTANEOUS
  Filled 2015-08-08 (×2): qty 0.3

## 2015-08-08 MED ORDER — LIDOCAINE HCL (PF) 1 % IJ SOLN
INTRAMUSCULAR | Status: DC | PRN
  Administered 2015-08-08: 18 mL via SUBCUTANEOUS

## 2015-08-08 MED ORDER — ACETAMINOPHEN 325 MG PO TABS: 650.0000 mg | ORAL_TABLET | ORAL | Status: DC | PRN

## 2015-08-08 MED ORDER — LIDOCAINE HCL (PF) 1 % IJ SOLN
INTRAMUSCULAR | Status: AC
  Filled 2015-08-08: qty 30

## 2015-08-08 MED ORDER — SODIUM CHLORIDE 0.9% FLUSH
3.0000 mL | Freq: Two times a day (BID) | INTRAVENOUS | Status: DC
  Administered 2015-08-08 – 2015-08-14 (×8): 3 mL via INTRAVENOUS

## 2015-08-08 MED ORDER — FUROSEMIDE 40 MG PO TABS
40.0000 mg | ORAL_TABLET | Freq: Two times a day (BID) | ORAL | Status: DC
  Administered 2015-08-08 – 2015-08-09 (×2): 40 mg via ORAL
  Filled 2015-08-08 (×2): qty 1

## 2015-08-08 MED ORDER — HEPARIN (PORCINE) IN NACL 2-0.9 UNIT/ML-% IJ SOLN
INTRAMUSCULAR | Status: AC
  Filled 2015-08-08: qty 500

## 2015-08-08 SURGICAL SUPPLY — 5 items
CATH SWAN GANZ 7F STRAIGHT (CATHETERS) ×1 IMPLANT
KIT HEART RIGHT NAMIC (KITS) ×2 IMPLANT
PACK CARDIAC CATHETERIZATION (CUSTOM PROCEDURE TRAY) ×2 IMPLANT
SHEATH PINNACLE 7F 10CM (SHEATH) ×1 IMPLANT
TRANSDUCER W/STOPCOCK (MISCELLANEOUS) ×3 IMPLANT

## 2015-08-08 NOTE — Progress Notes (Signed)
Patient ID: [name], female   DOB: April 21, 1934, 81 y.o.   MRN: [id number]  [facility] Progress Note   Assessment/ Plan:   1. AKI on chronic kidney disease stage V (baseline creatinine 3.6-3.9). Suspected to be hemodynamically mediated by acute exacerbation of diastolic heart failure. (She has a left BCF). Unimpressive urine output overnight on furosemide 80 mg IV twice a day with some rise of creatinine-may be at her "dry weight". Decrease furosemide to 40 mg IV twice a day and await results of right heart catheterization-if no significant evidence of volume overload, resume oral furosemide dose tomorrow. 2. Acute exacerbation of diastolic heart failure: Clinically improving with ongoing diuretic therapy-with net negative fluid balance noted however, worsening clinically. VQ scan low probability for pulmonary embolus. Awaiting right heart catheterization today. Likely  Needs pulse corticosteroid therapy for appears to be bronchospasm. 3. Hypokalemia: Secondary to renal wasting from recent increase in diuretic therapy-replace cautiously via oral route. 4. Anemia: With iron deficiency (iron saturation 5% with ferritin 52), status post 2 units PRBC. Status post intravenous iron. Start Aranesp 5. Possible community-acquired pneumonia: On antimicrobial coverage with azithromycin/cefpodoxime.  Subjective:   Continue to have some shortness of breath overnight requiring supplemental oxygen. Plans for a right heart catheterization later today by cardiology.    Objective:   BP 114/65 mmHg  Pulse 89  Temp(Src) 98.4 F (36.9 C) (Oral)  Resp 22  Wt 61 kg (134 lb 7.7 oz)  SpO2 91%  Intake/Output Summary (Last 24 hours) at 08/08/15 0941 Last data filed at 08/07/15 1700  Gross per 24 hour  Intake    123 ml  Output   1000 ml  Net   -877 ml   Weight change:   Physical Exam: [id number] uncomfortable resting in bed CVS: Pulse regular rhythm, normal rate Resp: Diminished breath  sounds bilaterally with intermittent expiratory wheeze/rhonchi Abd: Soft, flat, nontender Ext: Trace ankle edema. Left brachiocephalic fistula with pulsatile thrill.  Imaging: Nm Pulmonary Perf And Vent  08/07/2015  CLINICAL DATA:  Chest pain.  Shortness of breath. EXAM: NUCLEAR MEDICINE VENTILATION - PERFUSION LUNG SCAN TECHNIQUE: Ventilation images were obtained in multiple projections using inhaled aerosol Tc-3m DTPA. Perfusion images were obtained in multiple projections after intravenous injection of Tc-11m MAA. RADIOPHARMACEUTICALS:  30.5 mCi Technetium-94m DTPA aerosol inhalation and 4.1 mCi Technetium-57m MAA IV COMPARISON:  Chest x-ray 08/07/2015. FINDINGS: Extremely poor ventilation bilaterally consistent with previously identified bilateral pulmonary edema and/or pneumonia. No prominent perfusion defects noted. Tiny perfusion defects noted are smaller than ventilatory defects. IMPRESSION: Low probability pulmonary embolus. Electronically Signed   By: Marcello Moores  Register   On: 08/07/2015 12:33   Dg Chest Port 1 View  08/07/2015  CLINICAL DATA:  Acute onset of generalized chest pain. Initial encounter. EXAM: PORTABLE CHEST 1 VIEW COMPARISON:  Chest radiograph performed 08/06/2015 FINDINGS: The lungs are mildly hypoexpanded. Patchy bilateral airspace opacities may reflect mild pulmonary edema or pneumonia, superimposed on chronic changes. No pleural effusion or pneumothorax is seen. The cardiomediastinal silhouette is mildly enlarged. No acute osseous abnormalities are seen. IMPRESSION: Lungs mildly hypoexpanded. Patchy bilateral airspace opacities may reflect mild pulmonary edema or pneumonia, superimposed on chronic changes. Mild cardiomegaly. Electronically Signed   By: Garald Balding M.D.   On: 08/07/2015 05:09    Labs: BMET  Recent Labs Lab 08/01/15 1231 08/03/15 0530 08/04/15 0332 08/05/15 0339 08/06/15 0300 08/07/15 0250 08/08/15 0330  NA 137 139 138 137 138 136 133*  K 3.5 3.0*  2.9* 2.8*  3.2* 3.4* 4.2  CL 104 103 102 103 103 99* 98*  CO2 20* 22 22 22 23 24 22   GLUCOSE 106* 118* 104* 107* 100* 115* 104*  BUN 35* 34* 33* 35* 37* 39* 43*  CREATININE 4.56* 5.05* 5.09* 5.08* 4.85* 4.47* 4.67*  CALCIUM 7.6* 7.4* 7.2* 7.3* 7.6* 8.0* 8.1*   CBC  Recent Labs Lab 08/04/15 0332 08/05/15 0339 08/07/15 0250 08/08/15 0330  WBC 10.0 10.1 11.3* 17.9*  HGB 8.5* 8.7* 9.6* 8.6*  HCT 26.9* 27.5* 29.9* 27.4*  MCV 89.4 89.3 88.7 90.7  PLT 293 315 377 353   Medications:    . azithromycin  500 mg Oral q1800  . cefpodoxime  200 mg Oral Daily  . citalopram  20 mg Oral Daily  . furosemide  40 mg Intravenous BID  . heparin  5,000 Units Subcutaneous Q8H  . levothyroxine  50 mcg Oral QAC breakfast  . linaclotide  145 mcg Oral Daily  . NIFEdipine  60 mg Oral Daily  . pantoprazole  40 mg Oral Daily  . pravastatin  80 mg Oral QHS  . saccharomyces boulardii  250 mg Oral BID  . sodium chloride flush  3 mL Intravenous Q12H  . sodium chloride flush  3 mL Intravenous Q12H  . sodium chloride flush  3 mL Intravenous Q12H   Elmarie Shiley, MD 08/08/2015, 9:41 AM

## 2015-08-08 NOTE — Progress Notes (Signed)
Pt reported 8/10 chest pain that radiates to her back. Given nitroX2 and morphine with relief. Will continue to monitor closely.

## 2015-08-08 NOTE — H&P (View-Only) (Signed)
Cardiology Consult    Patient ID: [name] MRN: [id number], DOB/AGE: 09/06/1934   Admit date: 08/01/2015 Date of Consult: 08/07/2015  Primary Physician: Sherrie Mustache, MD Primary Cardiologist: Dr. Percival Spanish Requesting Provider: Dr. [place] Pink IM  Patient Profile    81 yo female with PMH of CAD s/p DES to the mid RCA, 50% prox LAD, and 50% narrowing of the marg branch of the Circumflex in 2008/HTN/s/p AAA repair 2014/HLD/Hypothyroidism/CKD IV and anemia.   Past Medical History   Past Medical History  Diagnosis Date  . CAD (coronary artery disease)     50% proximal LAD stenosis, 50% marginal stenosis of the circumflex, RCA 95% stenosis, EF 40% stenosis with hypokinesis of the interior wall.  She had a Taxus stent placed in 04/16/2006  . Hypertension   . Abdominal aortic aneurysm ([place])   . Renal artery stenosis ([facility])     S/P stenting of the right renal atrophic left renal  . Dyslipidemia   . Hypothyroidism   . Tobacco user     Previous  . Adenomatous polyp 2004, 2011  . Hyperplastic colon polyp 2011  . Hemorrhoids   . Diverticulosis   . Renal insufficiency   . Anemia   . Myocardial infarction ([place])     stent 2008  . Constipation   . Cataract   . AAA (abdominal aortic aneurysm) [facility])     Past Surgical History  Procedure Laterality Date  . Abdominal hysterectomy    . Carpal tunnel release Bilateral   . Cholecystectomy    . Flexible sigmoidoscopy  02/25/2012    Procedure: FLEXIBLE SIGMOIDOSCOPY;  Surgeon: Inda Castle, MD;  Location: [facility];  Service: Endoscopy;  Laterality: N/A;  . Hemorrhoid banding  02/25/2012    Procedure: HEMORRHOID BANDING;  Surgeon: Inda Castle, MD;  Location: [facility];  Service: Endoscopy;  Laterality: N/A;  . Abdominal angiogram  12/21/2012    AAA  . Abdominal aortic endovascular stent graft N/A 12/22/2012    Procedure: ABDOMINAL AORTIC ENDOVASCULAR STENT GRAFT- GORE;  Surgeon: Mal Misty, MD;  Location: [place];   Service: Vascular;  Laterality: N/A;  Ultrasound guided  . Abdominal angiogram N/A 11/30/2012    Procedure: ABDOMINAL ANGIOGRAM;  Surgeon: Serafina Mitchell, MD;  Location: [facility] CATH LAB;  Service: Cardiovascular;  Laterality: N/A;  . Cataract extraction, bilateral    . Colonoscopy    . Polypectomy    . Cardiac catheterization  2008  . Av fistula placement Left 04/20/2015    Procedure: CREATION OF LEFT UPPER ARM BRACHIOCEPHALIC ARTERIOVENOUS (AV) FISTULA  ;  Surgeon: Mal Misty, MD;  Location: [place];  Service: Vascular;  Laterality: Left;     Allergies  Allergies  Allergen Reactions  . Codeine Rash  . Sulfonamide Derivatives Hives    History of Present Illness    81 yo female patient of Dr. Rosezella [place] with PMH of CAD s/p DES to the mid RCA, 50% prox LAD, and 50% narrowing of the marg branch of the Circumflex in 2008/HTN/s/p AAA repair 2014/HLD/Hypothyroidism/CKD IV and anemia. Her last visit to the office was 08/2013 where she was noted to be in her normal state of health with no changes made to her medical therapy.   She presented to the [facility] ED on 08/01/2015 c/o "cold" like symptoms that she was seen at her PCP for and given antibiotics. In the ED she was noted to the afebrile, but hypertensive with an O2 level that dropped when attempted to  wean off of O2.  She was ultimately admitted to Internal Medicine service for hypoxia. On admission she had an elevated trop of 0.22 which was cycled with a negative trend. She was started on Iv antibiotics and nephrology was consulted in relation to dosing high dose lasix given her hx of CKD IV. Throughout the course of admission she has received multiple chest x-ray suggesting mild pulmonary edema that had persisted despite receiving lasix. Also received a VQ scan which was negative for PE, but continues to require 2L [place].   Repeat echo was obtained during this admission and noted a drop in her EF of 45-50% from 60-65% in 2012. She has also developed  reports of left sided chest pain that started last night while she was coughing. Family reports that she has been coughing right much during this admission, and the patient has complained of being sore in her chest. A trop was drawn today showing 0.06, WBC with slight trend upwards to 11.3, Cr with slight improvement from 5.08>>4.47 with 80mg  IV Lasix BID. Net UOP since admission is around 6L, but weight is only down 1lb. In talking with the patient she reports feeling better yesterday afternoon, even to the point she was able to walk in the hallway. Today she appears very dyspnea even at rest, continuing to require 2L [place].   Cardiology has been consulted in relation to patients report of chest pain.   Inpatient Medications    . azithromycin  500 mg Oral q1800  . cefpodoxime  200 mg Oral Daily  . citalopram  20 mg Oral Daily  . furosemide  80 mg Intravenous BID  . heparin  5,000 Units Subcutaneous Q8H  . levothyroxine  50 mcg Oral QAC breakfast  . linaclotide  145 mcg Oral Daily  . NIFEdipine  60 mg Oral Daily  . pantoprazole  40 mg Oral Daily  . pravastatin  80 mg Oral QHS  . saccharomyces boulardii  250 mg Oral BID  . sodium chloride flush  3 mL Intravenous Q12H  . sodium chloride flush  3 mL Intravenous Q12H    Family History    Family History  Problem Relation Age of Onset  . Uterine cancer Mother   . Heart attack Father 13  . Heart disease Father     before age 80  . Peripheral vascular disease Sister   . Other Sister     Renal artery stenosis  . Heart disease Sister     before age 31  . Heart attack Brother 63  . Heart disease Brother     before age 73  . Lung cancer Brother     \  . Colon cancer Neg Hx   . Breast cancer Maternal Aunt     x2    Social History    Social History   Social History  . Marital Status: Married    Spouse Name: N/A  . Number of Children: 4  . Years of Education: N/A   Occupational History  . Works at a [employer] in  [place]  . Smoking status: Former Smoker -- 1 years    Types: Cigarettes    Quit date: 03/17/2006  . Smokeless tobacco: Never Used  . Alcohol Use: No  . Drug Use: No  . Sexual Activity: Not on file   Other Topics Concern  . Not on file   Social History Narrative  Review of Systems    General:  No chills, fever, night sweats or weight changes.  Cardiovascular:  + chest pain, + dyspnea on exertion, edema, + orthopnea, palpitations, paroxysmal nocturnal dyspnea. Dermatological: No rash, lesions/masses Respiratory: No cough, + dyspnea Urologic: No hematuria, dysuria Abdominal:   No nausea, vomiting, diarrhea, bright red blood per rectum, melena, or hematemesis Neurologic:  No visual changes, wkns, changes in mental status, + fatigue All other systems reviewed and are otherwise negative except as noted above.  Physical Exam    Blood pressure 116/89, pulse 89, temperature 98 F (36.7 C), temperature source Oral, resp. rate 21, weight 133 lb 1.6 oz (60.374 kg), SpO2 98 %.  General: Pleasant frail older female, increased work of breathing Psych: Blunted affect. Neuro: Alert and oriented X 3. Moves all extremities spontaneously. HEENT: Normal  Neck: Supple without bruits or JVD. Lungs:  Resp regular, but labored, diminished in the lower lobes, using some accessory muscles. Heart: RRR no s3, s4, or murmurs. Abdomen: Soft, non-tender, non-distended, BS + x 4.  Extremities: No clubbing, cyanosis or edema. DP/PT/Radials 2+ and equal bilaterally. LUA Fistula +bruit.   Labs     Recent Labs  08/07/15 0826  TROPONINI 0.06*   Lab Results  Component Value Date   WBC 11.3* 08/07/2015   HGB 9.6* 08/07/2015   HCT 29.9* 08/07/2015   MCV 88.7 08/07/2015   PLT 377 08/07/2015    Recent Labs Lab 08/07/15 0250  NA 136  K 3.4*  CL 99*  CO2 24  BUN 39*  CREATININE 4.47*  CALCIUM 8.0*  GLUCOSE 115*     Radiology Studies    Nm  Pulmonary Perf And Vent  08/07/2015  CLINICAL DATA:  Chest pain.  Shortness of breath. EXAM: NUCLEAR MEDICINE VENTILATION - PERFUSION LUNG SCAN TECHNIQUE: Ventilation images were obtained in multiple projections using inhaled aerosol Tc-84m DTPA. Perfusion images were obtained in multiple projections after intravenous injection of Tc-8m MAA. RADIOPHARMACEUTICALS:  30.5 mCi Technetium-109m DTPA aerosol inhalation and 4.1 mCi Technetium-53m MAA IV COMPARISON:  Chest x-ray 08/07/2015. FINDINGS: Extremely poor ventilation bilaterally consistent with previously identified bilateral pulmonary edema and/or pneumonia. No prominent perfusion defects noted. Tiny perfusion defects noted are smaller than ventilatory defects. IMPRESSION: Low probability pulmonary embolus. Electronically Signed   By: Marcello Moores  Register   On: 08/07/2015 12:33   Dg Chest Port 1 View  08/07/2015  CLINICAL DATA:  Acute onset of generalized chest pain. Initial encounter. EXAM: PORTABLE CHEST 1 VIEW COMPARISON:  Chest radiograph performed 08/06/2015 FINDINGS: The lungs are mildly hypoexpanded. Patchy bilateral airspace opacities may reflect mild pulmonary edema or pneumonia, superimposed on chronic changes. No pleural effusion or pneumothorax is seen. The cardiomediastinal silhouette is mildly enlarged. No acute osseous abnormalities are seen. IMPRESSION: Lungs mildly hypoexpanded. Patchy bilateral airspace opacities may reflect mild pulmonary edema or pneumonia, superimposed on chronic changes. Mild cardiomegaly. Electronically Signed   By: Garald Balding M.D.   On: 08/07/2015 05:09    ECG & Cardiac Imaging    EKG: SR Rate-94 PVCs, no acute ST/T wave abnormalities  Echo: 08/02/2015  Study Conclusions  - Left ventricle: The cavity size was normal. Wall thickness was  increased in a pattern of mild LVH. Systolic function was mildly  reduced. The estimated ejection fraction was in the range of 45%  to 50%. Doppler parameters are  consistent with abnormal left  ventricular relaxation (grade 1 diastolic dysfunction). - Right atrium: The atrium was moderately dilated. - Pulmonary arteries: Systolic pressure was  moderately increased.  PA peak pressure: 41 mm Hg (S).  Assessment & Plan    1. Dyspnea: Presented to the ED on 08/01/2015 with reports of SOB and "cold" like symptoms that she was given antibiotics for by PCP. Admitted through Internal Medicine, when O2 sats fell after attempts to wean in the ED. During admission she has been diuresed with nephrology on 80mg  IV Lasix BID around 6.7L, but continues to be dyspneic. Received multiple chest x-rays reporting mild edema/ pneumonia. Is currently being treated with PO antibiotics. Today she reported left sided chest pain that started last night after coughing. Has been coughing since admission secondary to infection, but her chest has since become sore.  --In talking with the patient she reports more dyspnea than actual chest pain. Was feeling better yesterday and even about to ambulate in the hallway. States this morning the dyspnea returned while she was sitting in bed, and has remained throughout the day.  --On exam she has diminished lung sounds in the lower lobes, but no crackles, and minimal if any edema in the LE. She is using some accessory muscles.  --I question if this has more of a respiratory component given yesterdays x-ray suggest chronic bronchitic changes. Not currently receiving any IV steroids, I question if this may be beneficial.  2. Chest pain: Reports left sided chest pain which started last night after she had been coughing for a period of time. --She does was CAD with known persist disease following her LHC in 2008, there could be worsening of this, but given her current respiratory state, and CKD I do not think that she is a LHC candidate at this time.  --Stress testing maybe be beneficial, but she is currently dyspneic and I don't think she would  tolerate testing.  --2D echo shows a decrease in EF from 60-65% in 2012 to 45-50% 08/02/5015, did have an elevated PA pressure of 41  3. Acute CHF: On admission BNP was 3396, placed on 80mg  IV Lasix BID with nephrology following. Net UOP since admission around 6.7L -- Lungs slightly diminished in the lower lobes, but no crackles noted. Minimal LEE.  --Would continue with diuresis for now with nephrology recommendations.   4. CKD IV: Has improved since admission, currently Cr 4.47 from 5.0. Has a LUA fistula but has not began dialysis at this time. Following trend.   --Dr. Sallyanne Kuster to see patient.    Barnet Pall, NP-C Pager [phone] 08/07/2015, 2:26 PM  I have seen and examined the patient along with Reino Bellis, NP-C.  I have reviewed the chart, notes and new data.  I agree with NP's note.  Key new complaints: chest pain is sharp, but not clearly pleuritic or positional; she has significant dyspnea at rest, but has no orthopnea whatsoever - she is just as short of breath sitting up as she is lying fully flat. Key examination changes: unusual scratchy systolic sound heard in pulmonary focus, no pericardial rub ; 2/6 holosystolic murmur at left lower sternal border. Distinct S4, no S3. Key new findings / data: personally reviewed her echo images. There is a clear early relaxation pattern in the mitral inflow and the E/e' ratio is in the gray zone. RV dysfunction is a much more prominent abnormality than the minor degree of LV dysfunction. There is even a hint of McConnell's sign. Right atrial dilation dominates the degree of left atrial dilation. The inferior vena cava is plethoric.  PLAN:  Hypoxic respiratory failure, of uncertain pulmonary versus  cardiac etiology. Overall, there is more compelling evidence for pulmonary disease and right ventricular dysfunction, with at most equivocal evidence for left heart failure. Further evaluation and treatment choices are  significantly impacted by severe renal disease. Before we proceed with more aggressive diuresis, I recommend we clarify her volume status with a right heart catheterization (from the femoral approach, to avoid compromise to dialysis access). This procedure has been fully reviewed with the patient and written informed consent has been obtained.    Sanda Klein, MD, [facility] [phone] 08/07/2015, 4:40 PM

## 2015-08-08 NOTE — Progress Notes (Signed)
Site area: RFV Site Prior to Removal:  Level 0 Pressure Applied For:19min Manual:yes    Patient Status During Pull: stable  Post Pull Site:  Level Post Pull Instructions Given:  yes Post Pull Pulses Present:palpable  Dressing Applied: tegaderm  Bedrest begins @ [id number] Comments:by Laqueta Due

## 2015-08-08 NOTE — Progress Notes (Signed)
Patient Name: [name] Date of Encounter: 08/08/2015  Primary Cardiologist: Dr. Percival Spanish Patient Profile: 81 yo female with PMH of CAD s/p DES to the mid RCA, 50% prox LAD, and 50% narrowing of the marginal branch of the circumflex in 2008, HTN/s/p AAA repair 2014, HLD, hypothyroidism, CKD IV and anemia.   SUBJECTIVE:Feels ok, still SOB. Has chest pain only when she coughs.    OBJECTIVE Filed Vitals:   08/07/15 1944 08/07/15 2014 08/08/15 0500 08/08/15 0511  BP:  105/55  114/65  Pulse:  100  89  Temp:  98.1 F (36.7 C)  98.4 F (36.9 C)  TempSrc:  Oral  Oral  Resp:  20  22  Weight:   134 lb 7.7 oz (61 kg)   SpO2: 92% 89%  91%    Intake/Output Summary (Last 24 hours) at 08/08/15 0838 Last data filed at 08/07/15 1700  Gross per 24 hour  Intake    123 ml  Output   1000 ml  Net   -877 ml   Filed Weights   08/05/15 0552 08/06/15 0634 08/08/15 0500  Weight: 134 lb 6.4 oz (60.963 kg) 133 lb 1.6 oz (60.374 kg) 134 lb 7.7 oz (61 kg)    PHYSICAL EXAM General: Well developed, well nourished, female in no acute distress. Head: Normocephalic, atraumatic.  Neck: Supple without bruits,No JVD. Lungs:  Resp regular and unlabored, upper inspiratory and expiratory wheezes, bilateral lower lobe diminished rhonchi Heart: RRR, S1, S2, no S3, S4, or murmur; no rub. Abdomen: Soft, non-tender, non-distended, BS + x 4.  Extremities: No clubbing, cyanosis,No edema.  Neuro: Alert and oriented X 3. Moves all extremities spontaneously. Psych: Normal affect.  LABS: CBC: Recent Labs  08/07/15 0250 08/08/15 0330  WBC 11.3* 17.9*  HGB 9.6* 8.6*  HCT 29.9* 27.4*  MCV 88.7 90.7  PLT 377 353   INR: Recent Labs  08/07/15 2003  INR [id number]   Basic Metabolic Panel: Recent Labs  08/07/15 0250 08/08/15 0330  NA 136 133*  K 3.4* 4.2  CL 99* 98*  CO2 24 22  GLUCOSE 115* 104*  BUN 39* 43*  CREATININE 4.47* 4.67*  CALCIUM 8.0* 8.1*   Liver Function Tests:No results for  input(s): AST, ALT, ALKPHOS, BILITOT, PROT, ALBUMIN in the last 72 hours. Cardiac Enzymes: Recent Labs  08/07/15 0826 08/07/15 1403 08/07/15 2003  TROPONINI 0.06* 0.06* 0.06*   BNP:  B NATRIURETIC PEPTIDE  Date/Time Value Ref Range Status  08/01/2015 02:58 PM 3396.8* 0.0 - 100.0 pg/mL Final    Current facility-administered medications:  .  0.9 %  sodium chloride infusion, 250 mL, Intravenous, PRN, Lezlie Octave Black, NP .  0.9 %  sodium chloride infusion, 250 mL, Intravenous, PRN, Cheryln Manly, NP .  0.9 %  sodium chloride infusion, , Intravenous, Continuous, Cheryln Manly, NP, Last Rate: 10 mL/hr at 08/08/15 0601 .  acetaminophen (TYLENOL) tablet 650 mg, 650 mg, Oral, Q4H PRN, Radene Gunning, NP, 650 mg at 08/07/15 0809 .  albuterol (PROVENTIL) (2.5 MG/3ML) 0.083% nebulizer solution 2.5 mg, 2.5 mg, Nebulization, Q4H PRN, Bonnielee Haff, MD, 2.5 mg at 08/08/15 0536 .  alum & mag hydroxide-simeth (MAALOX/MYLANTA) 200-200-20 MG/5ML suspension 30 mL, 30 mL, Oral, Q6H PRN, Gardiner Barefoot, NP, 30 mL at 08/07/15 0144 .  azithromycin (ZITHROMAX) tablet 500 mg, 500 mg, Oral, q1800, Valeda Malm Rumbarger, RPH, 500 mg at 08/07/15 1754 .  bisacodyl (DULCOLAX) suppository 10 mg, 10 mg, Rectal, Daily PRN, Santiago Glad  Gaetano Net, NP .  cefpodoxime Bennie Pierini) tablet 200 mg, 200 mg, Oral, Daily, Valeda Malm Rumbarger, RPH, 200 mg at 08/07/15 1205 .  citalopram (CELEXA) tablet 20 mg, 20 mg, Oral, Daily, Lezlie Octave Black, NP, 20 mg at 08/07/15 1204 .  fluticasone (FLONASE) 50 MCG/ACT nasal spray 1 spray, 1 spray, Each Nare, Daily PRN, Radene Gunning, NP .  furosemide (LASIX) injection 80 mg, 80 mg, Intravenous, BID, Rexene Agent, MD, 80 mg at 08/07/15 1753 .  guaiFENesin-dextromethorphan (ROBITUSSIN DM) 100-10 MG/5ML syrup 5 mL, 5 mL, Oral, Q4H PRN, Bonnielee Haff, MD, 5 mL at 08/08/15 0536 .  heparin injection 5,000 Units, 5,000 Units, Subcutaneous, Q8H, Rozann Lesches, RPH, 5,000 Units at 08/08/15 0536 .   levothyroxine (SYNTHROID, LEVOTHROID) tablet 50 mcg, 50 mcg, Oral, QAC breakfast, Radene Gunning, NP, 50 mcg at 08/07/15 [phone] .  linaclotide (LINZESS) capsule 145 mcg, 145 mcg, Oral, Daily, Lezlie Octave Black, NP, 145 mcg at 08/07/15 1204 .  meclizine (ANTIVERT) tablet 25 mg, 25 mg, Oral, TID PRN, Radene Gunning, NP .  morphine 2 MG/ML injection 2 mg, 2 mg, Intravenous, Q4H PRN, Bonnielee Haff, MD, 2 mg at 08/07/15 1951 .  NIFEdipine (PROCARDIA-XL/ADALAT CC) 24 hr tablet 60 mg, 60 mg, Oral, Daily, Lezlie Octave Black, NP, 60 mg at 08/07/15 1205 .  nitroGLYCERIN (NITROSTAT) SL tablet 0.4 mg, 0.4 mg, Sublingual, Q5 min PRN, Gardiner Barefoot, NP, 0.4 mg at 08/07/15 1946 .  ondansetron (ZOFRAN) tablet 4 mg, 4 mg, Oral, Q6H PRN **OR** ondansetron (ZOFRAN) injection 4 mg, 4 mg, Intravenous, Q6H PRN, Lezlie Octave Black, NP .  pantoprazole (PROTONIX) EC tablet 40 mg, 40 mg, Oral, Daily, Lezlie Octave Black, NP, 40 mg at 08/07/15 1204 .  pravastatin (PRAVACHOL) tablet 80 mg, 80 mg, Oral, QHS, Lezlie Octave Black, NP, 80 mg at 08/07/15 2214 .  saccharomyces boulardii (FLORASTOR) capsule 250 mg, 250 mg, Oral, BID, Bonnielee Haff, MD, 250 mg at 08/07/15 2214 .  sodium chloride flush (NS) 0.9 % injection 3 mL, 3 mL, Intravenous, Q12H, Lezlie Octave Black, NP, 3 mL at 08/06/15 2259 .  sodium chloride flush (NS) 0.9 % injection 3 mL, 3 mL, Intravenous, Q12H, Lezlie Octave Black, NP, 3 mL at 08/07/15 1207 .  sodium chloride flush (NS) 0.9 % injection 3 mL, 3 mL, Intravenous, PRN, Lezlie Octave Black, NP .  sodium chloride flush (NS) 0.9 % injection 3 mL, 3 mL, Intravenous, Q12H, Cheryln Manly, NP, 3 mL at 08/07/15 2216 .  sodium chloride flush (NS) 0.9 % injection 3 mL, 3 mL, Intravenous, PRN, Cheryln Manly, NP .  technetium TC 44M diethylenetriame-pentaacetic acid (DTPA) injection 30.5 milli Curie, 30.5 milli Curie, Intravenous, Once PRN, Gus Height, MD .  traMADol Veatrice Bourbon) tablet 50 mg, 50 mg, Oral, Q6H PRN, Radene Gunning, NP, 50 mg at 08/08/15  0559 .  traZODone (DESYREL) tablet 25 mg, 25 mg, Oral, QHS PRN, Lezlie Octave Black, NP . sodium chloride 10 mL/hr at 08/08/15 0601   TELE:  NSR  ECG: NSR  Radiology/Studies: Dg Chest 2 View  08/06/2015  CLINICAL DATA:  Onset of dyspnea today; history of coronary artery disease, CHF, severe chronic renal insufficiency, former smoker. EXAM: CHEST  2 VIEW COMPARISON:  Chest x-ray dated Aug 03, 2015. FINDINGS: The right hemidiaphragm remains elevated. The pulmonary interstitial markings remain increased and any areas demonstrate confluence. These lung markings are slightly more conspicuous than on the previous study. The cardiac silhouette remains enlarged. The  ascending and descending thoracic aorta as remain tortuous. A large hiatal hernia is present. There is no pleural effusion or pneumothorax. An abdominal aortic stent graft is partially visible in the field of view. The observed bony thorax is unremarkable. IMPRESSION: 1. Persistent increased interstitial markings consistent with mild pulmonary interstitial edema superimposed upon chronic bronchitic changes. Stable cardiomegaly. Stable tortuosity of the ascending and descending thoracic aorta. 2. Areas of confluent density in the right upper lobe likely reflects early pneumonia though pulmonary edema could produce similar findings. 3. Moderate-sized hiatal hernia containing an air-fluid level. Electronically Signed   By: David  Martin[place] M.D.   On: 08/06/2015 08:49   Nm Pulmonary Perf And Vent  08/07/2015  CLINICAL DATA:  Chest pain.  Shortness of breath. EXAM: NUCLEAR MEDICINE VENTILATION - PERFUSION LUNG SCAN TECHNIQUE: Ventilation images were obtained in multiple projections using inhaled aerosol Tc-54m DTPA. Perfusion images were obtained in multiple projections after intravenous injection of Tc-58m MAA. RADIOPHARMACEUTICALS:  30.5 mCi Technetium-26m DTPA aerosol inhalation and 4.1 mCi Technetium-110m MAA IV COMPARISON:  Chest x-ray 08/07/2015. FINDINGS:  Extremely poor ventilation bilaterally consistent with previously identified bilateral pulmonary edema and/or pneumonia. No prominent perfusion defects noted. Tiny perfusion defects noted are smaller than ventilatory defects. IMPRESSION: Low probability pulmonary embolus. Electronically Signed   By: Marcello Moores  Register   On: 08/07/2015 12:33   Dg Chest Port 1 View  08/07/2015  CLINICAL DATA:  Acute onset of generalized chest pain. Initial encounter. EXAM: PORTABLE CHEST 1 VIEW COMPARISON:  Chest radiograph performed 08/06/2015 FINDINGS: The lungs are mildly hypoexpanded. Patchy bilateral airspace opacities may reflect mild pulmonary edema or pneumonia, superimposed on chronic changes. No pleural effusion or pneumothorax is seen. The cardiomediastinal silhouette is mildly enlarged. No acute osseous abnormalities are seen. IMPRESSION: Lungs mildly hypoexpanded. Patchy bilateral airspace opacities may reflect mild pulmonary edema or pneumonia, superimposed on chronic changes. Mild cardiomegaly. Electronically Signed   By: Garald Balding M.D.   On: 08/07/2015 05:09     Current Medications:  . azithromycin  500 mg Oral q1800  . cefpodoxime  200 mg Oral Daily  . citalopram  20 mg Oral Daily  . furosemide  80 mg Intravenous BID  . heparin  5,000 Units Subcutaneous Q8H  . levothyroxine  50 mcg Oral QAC breakfast  . linaclotide  145 mcg Oral Daily  . NIFEdipine  60 mg Oral Daily  . pantoprazole  40 mg Oral Daily  . pravastatin  80 mg Oral QHS  . saccharomyces boulardii  250 mg Oral BID  . sodium chloride flush  3 mL Intravenous Q12H  . sodium chloride flush  3 mL Intravenous Q12H  . sodium chloride flush  3 mL Intravenous Q12H   . sodium chloride 10 mL/hr at 08/08/15 0601    ASSESSMENT AND PLAN: Principal Problem:   Hypoxia Active Problems:   Coronary atherosclerosis   AAA (abdominal aortic aneurysm) (HCC)   Hypothyroidism   Chronic kidney disease (CKD), stage IV (severe) (HCC)   CAP (community  acquired pneumonia)   Anemia   Elevated troponin   Congestive heart failure (HCC)   Acute renal failure superimposed on stage 4 chronic kidney disease (HCC)   Acute systolic CHF (congestive heart failure) (Standard)  1. Dyspnea: patient has dyspnea at rest, but denies orthopnea. She has had at least 5 days of IV diuresis with 7.6L out. Going for right heart cath today as the etiology of her dyspnea appears to be more related to right ventricular dysfunction vs. Pulmonary disease  rather than left heart failure. Her Echo shows EF of 45-50% with mild LVH and grade 1 diastolic dysfunction. Aortic valve is structurally normal.   Infection is also a possible etiology, chest x ray today shows patchy bilateral airspace opacities that may reflect mild pulmonary edema or pneumonia. She does have increasing WBC's over the past few days. Primary team is managing and patient is receiving antibiotics.VQ scan done yesterday, shows low probability of PE.   2. Acute on chronic systolic CHF: patient now with reduced EF of 45-50%, she has diuresed well on IV Lasix, however she has stage IV CKD and her creatinine today is 4.67, also slightly hyponatremic today. Nephrology following, and notes that her creatinine has actually improved over the last few days. We can further evaluate her need for IV diuresis when we evaluate her PAWP today with right heart cath. She does not appear volume overloaded on exam.   3. CKD stage IV: Nephrology following. Patient has LUA fistula but no HD as of yet. Her baseline is 3.6-3.9. She continues to respond well with IV diuresis.   4. Chest pain: Patient had left sided chest pain, with minimal troponin elevation with flat trend. Her pain seems to be related to her coughing spells. It is reproducible by palpation at time and only occurs with coughing.    Signed, Arbutus Leas , NP 8:38 AM 08/08/2015 Pager [phone] Patient seen and examined and history reviewed. Agree with above  findings and plan. Right heart data reviewed. Normal wedge pressure. Mild PAH. These findings are consistent with a primary pulmonary process for her dyspnea. Can resume oral lasix. Further work of pulmonary process per primary team.  Lillieanna Tuohy [place], [place] 08/08/2015 2:42 PM

## 2015-08-08 NOTE — Progress Notes (Signed)
Patient ID: [name], female   DOB: April 14, 1934, 81 y.o.   MRN: [id number]    PROGRESS NOTE    [name]  [id number] DOB: May 04, 1934 DOA: 08/01/2015  PCP: Sherrie Mustache, MD   Brief Narrative:  81 year old Caucasian female with known coronary artery disease, hypertension, hyperlipidemia, hypothyroidism, anemia, presented with shortness of breath. Patient was initially started on IV antibiotics but rather slow to improve. Her creatinine worsened. Chest x-ray showed persistent infiltrates. She was given high dose Lasix. Nephrology was consulted. Patient however did not improve as anticipated. Cardiology was subsequently consulted.  Assessment & Plan:  AKI on chronic kidney disease stage V - baseline creatinine 3.6-3.9) - hemodynamically mediated by acute exacerbation of diastolic heart failure - no significant urine output overnight on furosemide 80 mg IV twice a day, plan is to lower the dose of furosemide to 40 mg BID - further recommendations pending right heart cath which is planned for today   Acute on chronic systolic CHF - reduced EF of 45-50%, further evaluation with right heart cath - appreciate cardiology team following   Acute respiratory distress - appears to be multifactorial and secondary to acute on chronic systolic CHF with ? Underlying PNA given persistent productive cough and leukocytosis  - pt still with mild discomfort even at rest due to dyspnea, RR in mid to high 20's  - I think it is reasonable to proceed with CT chest w/o contrast for clearer evaluation - will wait for right heart cath first and further recommendations pending results   Hypokalemia - Secondary to renal wasting from recent increase in diuretic therapy - WNL this AM - BMP in AM  Hypothyroidism - continue synthroid   HLD - continue statin   Anemia of iron deficiency  - iron saturation 5% with ferritin 52, status post 2 units PRBC. Status post intravenous iron - aranesp  started per nephrology   DVT prophylaxis: Heparin SQ  Code Status: Full   Family Communication: Patient and daughter at bedside, discussed current status and further plans, explained that further recommendations pending right heart cath but we have agreed that CT chest is also reasonable to further evaluate persistent dyspnea, pt and daughter in agreement and have no further questions   Disposition Plan: pending right heart cath and CT chest   Consultants:   Cardiology   Procedures:   Right heart cath 5/24 -->  Antimicrobials:   Zithromax 5/20 -->  Vantin 5/20 -->   Subjective: Still with dyspnea at rest and with exertion, cough productive of clear and whitish sputum, night sweats.   Objective: Filed Vitals:   08/08/15 1246 08/08/15 1248 08/08/15 1253 08/08/15 1305  BP: 103/70 106/68  104/60  Pulse: 93 92 91 93  Temp:      TempSrc:      Resp: 29 30 19 26   Weight:      SpO2: 99% 94% 0% 91%    Intake/Output Summary (Last 24 hours) at 08/08/15 1314 Last data filed at 08/07/15 1700  Gross per 24 hour  Intake    120 ml  Output   1000 ml  Net   -880 ml   Filed Weights   08/05/15 0552 08/06/15 0634 08/08/15 0500  Weight: 60.963 kg (134 lb 6.4 oz) 60.374 kg (133 lb 1.6 oz) 61 kg (134 lb 7.7 oz)    Examination:  General exam: Appears calm and comfortable  Respiratory system: rhonchi at bases with slight dullness to percussion, mild exp wheezing  Cardiovascular system:  S1 & S2 heard, RRR. No JVD, murmurs, rubs, gallops or clicks. No pedal edema. Gastrointestinal system: Abdomen is nondistended, soft and nontender.  Central nervous system: Alert and oriented. No focal neurological deficits. Psychiatry: Judgement and insight appear normal. Mood & affect appropriate.   Data Reviewed: I have personally reviewed following labs and imaging studies  CBC:  Recent Labs Lab 08/03/15 0530 08/04/15 0332 08/05/15 0339 08/07/15 0250 08/08/15 0330  WBC 9.9 10.0 10.1  11.3* 17.9*  HGB 7.4* 8.5* 8.7* 9.6* 8.6*  HCT 22.9* 26.9* 27.5* 29.9* 27.4*  MCV 91.6 89.4 89.3 88.7 90.7  PLT 307 293 315 377 0000000   Basic Metabolic Panel:  Recent Labs Lab 08/04/15 0332 08/05/15 0339 08/06/15 0300 08/07/15 0250 08/08/15 0330  NA 138 137 138 136 133*  K 2.9* 2.8* 3.2* 3.4* 4.2  CL 102 103 103 99* 98*  CO2 22 22 23 24 22   GLUCOSE 104* 107* 100* 115* 104*  BUN 33* 35* 37* 39* 43*  CREATININE 5.09* 5.08* 4.85* 4.47* 4.67*  CALCIUM 7.2* 7.3* 7.6* 8.0* 8.1*   Coagulation Profile:  Recent Labs Lab 08/07/15 2003  INR 1.31   Cardiac Enzymes:  Recent Labs Lab 08/01/15 2241 08/02/15 0839 08/07/15 0826 08/07/15 1403 08/07/15 2003  TROPONINI 0.28* 0.18* 0.06* 0.06* 0.06*   Urine analysis:    Component Value Date/Time   COLORURINE YELLOW 08/08/2015 0550   APPEARANCEUR CLOUDY* 08/08/2015 0550   LABSPEC 1.015 08/08/2015 0550   PHURINE 5.5 08/08/2015 0550   GLUCOSEU NEGATIVE 08/08/2015 0550   HGBUR TRACE* 08/08/2015 0550   BILIRUBINUR NEGATIVE 08/08/2015 0550   KETONESUR NEGATIVE 08/08/2015 0550   PROTEINUR 30* 08/08/2015 0550   UROBILINOGEN 0.2 12/21/2012 1300   NITRITE NEGATIVE 08/08/2015 0550   LEUKOCYTESUR NEGATIVE 08/08/2015 0550   Recent Results (from the past 240 hour(s))  Culture, blood (routine x 2) Call MD if unable to obtain prior to antibiotics being given     Status: None   Collection Time: 08/01/15  2:52 PM  Result Value Ref Range Status   Specimen Description BLOOD RIGHT HAND  Final   Special Requests IN PEDIATRIC BOTTLE 1CC  Final   Culture NO GROWTH 5 DAYS  Final   Report Status 08/06/2015 FINAL  Final  Culture, blood (routine x 2) Call MD if unable to obtain prior to antibiotics being given     Status: None   Collection Time: 08/01/15  2:57 PM  Result Value Ref Range Status   Specimen Description BLOOD RIGHT ANTECUBITAL  Final   Special Requests IN PEDIATRIC BOTTLE 1CC  Final   Culture NO GROWTH 5 DAYS  Final   Report Status  08/06/2015 FINAL  Final  Culture, sputum-assessment     Status: None   Collection Time: 08/02/15  5:27 AM  Result Value Ref Range Status   Specimen Description EXPECTORATED SPUTUM  Final   Special Requests NONE  Final   Sputum evaluation   Final    THIS SPECIMEN IS ACCEPTABLE. RESPIRATORY CULTURE REPORT TO FOLLOW.   Report Status 08/02/2015 FINAL  Final  Culture, respiratory (NON-Expectorated)     Status: None   Collection Time: 08/02/15  7:28 AM  Result Value Ref Range Status   Specimen Description SPUTUM  Final   Special Requests NONE  Final   Gram Stain   Final    FEW WBC FEW SQUAMOUS EPITHELIAL CELLS PRESENT FEW GRAM POSITIVE COCCI IN PAIRS Performed at [facility]    Culture   Final  NORMAL OROPHARYNGEAL FLORA Performed at [facility]    Report Status 08/05/2015 FINAL  Final      Radiology Studies: Nm Pulmonary Perf And Vent  08/07/2015  CLINICAL DATA:  Chest pain.  Shortness of breath. EXAM: NUCLEAR MEDICINE VENTILATION - PERFUSION LUNG SCAN TECHNIQUE: Ventilation images were obtained in multiple projections using inhaled aerosol Tc-76m DTPA. Perfusion images were obtained in multiple projections after intravenous injection of Tc-31m MAA. RADIOPHARMACEUTICALS:  30.5 mCi Technetium-35m DTPA aerosol inhalation and 4.1 mCi Technetium-44m MAA IV COMPARISON:  Chest x-ray 08/07/2015. FINDINGS: Extremely poor ventilation bilaterally consistent with previously identified bilateral pulmonary edema and/or pneumonia. No prominent perfusion defects noted. Tiny perfusion defects noted are smaller than ventilatory defects. IMPRESSION: Low probability pulmonary embolus. Electronically Signed   By: Marcello Moores  Register   On: 08/07/2015 12:33   Dg Chest Port 1 View  08/07/2015  CLINICAL DATA:  Acute onset of generalized chest pain. Initial encounter. EXAM: PORTABLE CHEST 1 VIEW COMPARISON:  Chest radiograph performed 08/06/2015 FINDINGS: The lungs are mildly hypoexpanded. Patchy  bilateral airspace opacities may reflect mild pulmonary edema or pneumonia, superimposed on chronic changes. No pleural effusion or pneumothorax is seen. The cardiomediastinal silhouette is mildly enlarged. No acute osseous abnormalities are seen. IMPRESSION: Lungs mildly hypoexpanded. Patchy bilateral airspace opacities may reflect mild pulmonary edema or pneumonia, superimposed on chronic changes. Mild cardiomegaly. Electronically Signed   By: Garald Balding M.D.   On: 08/07/2015 05:09      Scheduled Meds: . [MAR Hold] azithromycin  500 mg Oral q1800  . [MAR Hold] cefpodoxime  200 mg Oral Daily  . [MAR Hold] citalopram  20 mg Oral Daily  . [MAR Hold] darbepoetin (ARANESP) injection - NON-DIALYSIS  60 mcg Subcutaneous Q Wed-1800  . [MAR Hold] furosemide  40 mg Intravenous BID  . [MAR Hold] heparin  5,000 Units Subcutaneous Q8H  . [MAR Hold] levothyroxine  50 mcg Oral QAC breakfast  . [MAR Hold] linaclotide  145 mcg Oral Daily  . [MAR Hold] NIFEdipine  60 mg Oral Daily  . [MAR Hold] pantoprazole  40 mg Oral Daily  . [MAR Hold] pravastatin  80 mg Oral QHS  . [MAR Hold] saccharomyces boulardii  250 mg Oral BID  . [MAR Hold] sodium chloride flush  3 mL Intravenous Q12H  . [MAR Hold] sodium chloride flush  3 mL Intravenous Q12H  . sodium chloride flush  3 mL Intravenous Q12H   Continuous Infusions: . sodium chloride 10 mL/hr at 08/08/15 0601     LOS: 6 days    Time spent: 20 minutes    Faye Ramsay, MD [facility] Pager [phone]  If 7PM-7AM, please contact night-coverage [email or web address] Password [email or web address] 08/08/2015, 1:14 PM

## 2015-08-08 NOTE — Interval H&P Note (Signed)
History and Physical Interval Note:  08/08/2015 11:54 AM  [name]  has presented today for surgery, with the diagnosis of chf  The various methods of treatment have been discussed with the patient and family. After consideration of risks, benefits and other options for treatment, the patient has consented to  Procedure(s): Right Heart Cath (N/A) as a surgical intervention .  The patient's history has been reviewed, patient examined, no change in status, stable for surgery.  I have reviewed the patient's chart and labs.  Questions were answered to the patient's satisfaction.     Bensimhon, Quillian Quince

## 2015-08-09 DIAGNOSIS — J810 Acute pulmonary edema: Secondary | ICD-10-CM

## 2015-08-09 DIAGNOSIS — R918 Other nonspecific abnormal finding of lung field: Secondary | ICD-10-CM

## 2015-08-09 DIAGNOSIS — R0902 Hypoxemia: Secondary | ICD-10-CM

## 2015-08-09 LAB — URINE CULTURE

## 2015-08-09 LAB — RENAL FUNCTION PANEL
ANION GAP: 15 (ref 5–15)
Albumin: 2.4 g/dL — ABNORMAL LOW (ref 3.5–5.0)
BUN: 62 mg/dL — ABNORMAL HIGH (ref 6–20)
CALCIUM: 7.8 mg/dL — AB (ref 8.9–10.3)
CHLORIDE: 96 mmol/L — AB (ref 101–111)
CO2: 22 mmol/L (ref 22–32)
Creatinine, Ser: 5.65 mg/dL — ABNORMAL HIGH (ref 0.44–1.00)
GFR calc non Af Amer: 6 mL/min — ABNORMAL LOW (ref >60)
GFR, EST AFRICAN AMERICAN: 8 mL/min — AB (ref >60)
Glucose, Bld: 117 mg/dL — ABNORMAL HIGH (ref 65–99)
Phosphorus: 5.2 mg/dL — ABNORMAL HIGH (ref 2.5–4.6)
Potassium: 3.9 mmol/L (ref 3.5–5.1)
Sodium: 133 mmol/L — ABNORMAL LOW (ref 135–145)

## 2015-08-09 LAB — CBC
HEMATOCRIT: 27.9 % — AB (ref 36.0–46.0)
HEMOGLOBIN: 8.7 g/dL — AB (ref 12.0–15.0)
MCH: 28.2 pg (ref 26.0–34.0)
MCHC: 31.2 g/dL (ref 30.0–36.0)
MCV: 90.6 fL (ref 78.0–100.0)
Platelets: 410 10*3/uL — ABNORMAL HIGH (ref 150–400)
RBC: 3.08 MIL/uL — AB (ref 3.87–5.11)
RDW: 14.8 % (ref 11.5–15.5)
WBC: 15 10*3/uL — ABNORMAL HIGH (ref 4.0–10.5)

## 2015-08-09 LAB — SEDIMENTATION RATE: Sed Rate: 122 mm/hr — ABNORMAL HIGH (ref 0–22)

## 2015-08-09 LAB — C-REACTIVE PROTEIN: CRP: 34.1 mg/dL — AB (ref <1.0)

## 2015-08-09 MED ORDER — FUROSEMIDE 40 MG PO TABS
40.0000 mg | ORAL_TABLET | Freq: Every day | ORAL | Status: DC
  Administered 2015-08-10: 40 mg via ORAL
  Filled 2015-08-09: qty 1

## 2015-08-09 NOTE — Progress Notes (Signed)
Patient ID: [name], female   DOB: 16-Jul-1934, 81 y.o.   MRN: [id number]  [facility] Progress Note   Assessment/ Plan:   1. AKI on chronic kidney disease stage V (baseline creatinine 3.6-3.9). Suspected to be hemodynamically mediated by acute exacerbation of diastolic heart failure. (She has a left BCF). Renal function worse overnight but without acute HD needs as yet--will continue to follow on lower lasix dose (convert to oral today). 2. Acute exacerbation of diastolic heart failure: Appears to be compensated per RHC report- decrease PO lasix to 40mg  daily. 3. Hypoxia: CT scan reviewed and with concerning parenchymal pattern-- awaiting evaluation by pulmonary. 4. Anemia: With iron deficiency (iron saturation 5% with ferritin 52), status post 2 units PRBC. Status post intravenous iron and now on Aranesp 5. Possible community-acquired pneumonia: On antimicrobial coverage with azithromycin/cefpodoxime.  Subjective:   Continue to have some shortness of breath overnight requiring supplemental oxygen. CT scan results reviewed.    Objective:   BP 114/65 mmHg  Pulse 96  Temp(Src) 97.9 F (36.6 C) (Oral)  Resp 18  Wt 61 kg (134 lb 7.7 oz)  SpO2 91%  Intake/Output Summary (Last 24 hours) at 08/09/15 0935 Last data filed at 08/09/15 0544  Gross per 24 hour  Intake    300 ml  Output    150 ml  Net    150 ml   Weight change:   Physical Exam: [id number] uncomfortable resting in bed CVS: Pulse regular rhythm, normal rate Resp: Diminished breath sounds bilaterally with intermittent expiratory wheeze/rhonchi Abd: Soft, flat, nontender Ext: No ankle edema. Left brachiocephalic fistula with pulsatile thrill.  Imaging: Ct Chest Wo Contrast  08/08/2015  CLINICAL DATA:  Persistent infiltrates and shortness of breath. EXAM: CT CHEST WITHOUT CONTRAST TECHNIQUE: Multidetector CT imaging of the chest was performed following the standard protocol without IV contrast. COMPARISON:   Radiography 08/07/2011 an multiple previous chest radiographs. FINDINGS: Throughout both lungs, there are geographic areas of septal thickening and ground-glass opacity in a patchy distribution. Some lobules are affected an others are not. Most common cause of this pattern is alveolar proteinosis. Other causes can include pneumocystis pneumonia, bronchoalveolar carcinoma, sarcoid, nonspecific interstitial pneumonia and cryptogenic organizing pneumonia. Adult respiratory distress syndrome and pulmonary hemorrhage can also cause this. There is atherosclerosis of the aorta and of the coronary arteries. There is a small amount of dependent pleural fluid on the left with minimal dependent atelectasis. There is a small to moderate amount of pericardial fluid. Small hiatal hernia within the inferior mediastinum. 1 cm right paratracheal lymph node just below the thoracic inlet. No significant bone finding. No significant finding in the upper abdomen. IMPRESSION: Markedly abnormal pulmonary parenchyma which geographic areas of septal thickening and ground-glass opacity. This pattern was previously felt to be highly suggestive of alveolar proteinosis. However other diagnoses can also cause this pattern including atypical pneumonia, bronchoalveolar carcinoma, sarcoid, nonspecific interstitial pneumonia, cryptogenic organizing pneumonia, ARDS and pulmonary hemorrhage. Small amount of left pleural fluid with dependent atelectasis. Small to moderate pericardial effusion. Atherosclerosis of the aorta and the coronary arteries. 1 cm right paratracheal lymph node just beneath the thoracic inlet. There is not an extensive pattern of lymphadenopathy. Electronically Signed   By: Nelson Chimes M.D.   On: 08/08/2015 20:01   Nm Pulmonary Perf And Vent  08/07/2015  CLINICAL DATA:  Chest pain.  Shortness of breath. EXAM: NUCLEAR MEDICINE VENTILATION - PERFUSION LUNG SCAN TECHNIQUE: Ventilation images were obtained in multiple projections  using inhaled  aerosol Tc-21m DTPA. Perfusion images were obtained in multiple projections after intravenous injection of Tc-17m MAA. RADIOPHARMACEUTICALS:  30.5 mCi Technetium-17m DTPA aerosol inhalation and 4.1 mCi Technetium-30m MAA IV COMPARISON:  Chest x-ray 08/07/2015. FINDINGS: Extremely poor ventilation bilaterally consistent with previously identified bilateral pulmonary edema and/or pneumonia. No prominent perfusion defects noted. Tiny perfusion defects noted are smaller than ventilatory defects. IMPRESSION: Low probability pulmonary embolus. Electronically Signed   ByMarcello Moores  Register   On: 08/07/2015 12:33    Labs: BMET  Recent Labs Lab 08/03/15 0530 08/04/15 [id number] 08/05/15 0339 08/06/15 0300 08/07/15 0250 08/08/15 0330 08/09/15 0338  NA 139 138 137 138 136 133* 133*  K 3.0* 2.9* 2.8* 3.2* 3.4* 4.2 3.9  CL 103 102 103 103 99* 98* 96*  CO2 22 22 22 23 24 22 22   GLUCOSE 118* 104* 107* 100* 115* 104* 117*  BUN 34* 33* 35* 37* 39* 43* 62*  CREATININE 5.05* 5.09* 5.08* 4.85* 4.47* 4.67* 5.65*  CALCIUM 7.4* 7.2* 7.3* 7.6* 8.0* 8.1* 7.8*  PHOS  --   --   --   --   --   --  5.2*   CBC  Recent Labs Lab 08/05/15 0339 08/07/15 0250 08/08/15 0330 08/09/15 0338  WBC 10.1 11.3* 17.9* 15.0*  HGB 8.7* 9.6* 8.6* 8.7*  HCT 27.5* 29.9* 27.4* 27.9*  MCV 89.3 88.7 90.7 90.6  PLT 315 377 353 410*   Medications:    . azithromycin  500 mg Oral q1800  . cefpodoxime  200 mg Oral Daily  . citalopram  20 mg Oral Daily  . darbepoetin (ARANESP) injection - NON-DIALYSIS  60 mcg Subcutaneous Q Wed-1800  . furosemide  40 mg Oral BID  . heparin  5,000 Units Subcutaneous Q8H  . levothyroxine  50 mcg Oral QAC breakfast  . linaclotide  145 mcg Oral Daily  . NIFEdipine  60 mg Oral Daily  . pantoprazole  40 mg Oral Daily  . pravastatin  80 mg Oral QHS  . saccharomyces boulardii  250 mg Oral BID  . sodium chloride flush  3 mL Intravenous Q12H  . sodium chloride flush  3 mL Intravenous Q12H   . sodium chloride flush  3 mL Intravenous Q12H  . sodium chloride flush  3 mL Intravenous Q12H   Elmarie Shiley, MD 08/09/2015, 9:35 AM

## 2015-08-09 NOTE — Progress Notes (Signed)
Patient ID: [name], female   DOB: [birth year], 81 y.o.   MRN: [id number]    PROGRESS NOTE    RAELANI NIWA  [id number] DOB: Jan 12, 1935 DOA: 08/01/2015  PCP: Sherrie Mustache, MD   Brief Narrative:  81 year old Caucasian female with known coronary artery disease, hypertension, hyperlipidemia, hypothyroidism, anemia, presented with shortness of breath. Patient was initially started on IV antibiotics but rather slow to improve. Her creatinine worsened. Chest x-ray showed persistent infiltrates. She was given high dose Lasix. Nephrology was consulted. Patient however did not improve as anticipated. Cardiology was subsequently consulted.  Assessment & Plan:  AKI on chronic kidney disease stage V - baseline creatinine 3.6-3.9) - hemodynamically mediated by acute exacerbation of diastolic heart failure - dose of lasix was lowered from 80 mg to 40 mg IV BID and Cr is now worse this AM - appreciate nephrology team assistance   Acute on chronic systolic CHF - reduced EF of 45-50%, RHC unremarkable  - appreciate cardiology team following, they will sign off as no further cardiac eval or intervention needed   Acute respiratory distress - appears to be pulmonary in etiology  - pt still with mild discomfort even at rest due to dyspnea - CT chest with diffuse ground glass opacities bilaterally with broad differential including sarcoidosis, PNA, malignancy  - PCCM consulted for assistance   Hypokalemia - Secondary to renal wasting from recent increase in diuretic therapy - WNL this AM - BMP in AM  Hypothyroidism - continue synthroid   HLD - continue statin   Thrombocytosis - reactive  - CBC in AM  Anemia of iron deficiency  - iron saturation 5% with ferritin 52, status post 2 units PRBC. Status post intravenous iron - aranesp started per nephrology   DVT prophylaxis: Heparin SQ  Code Status: Full   Family Communication: Patient and husband at bedside, reviewed CT  images in detail including review of CT scans from 2015 for comparison. Pt is agreement with PCCM consultation for further assistance.   Disposition Plan: pending right heart cath and CT chest   Consultants:   Cardiology - signed off on 5/25   PCCM consulted 5/25   Procedures:   Right heart cath 5/24 --> see below   Findings: RA = 10 RV = 37/10 PA = 41/10 (22) PCW = 8 Fick cardiac output/index = 4.8/2.8 PVR = 2.9 WU FA sat = 89% on 3L PA sat = 47%, 51%  Assessment: 1. Mild PAH (likely WHO Group 3) 2. Normal PCWP and cardiac output 3. Resting hypoxemia 4. Suspect dyspnea and mild PAH due to intrinsic lung disease. Left side pressures are normal. PVR normal.  5. No role for selective pulmonary vasodilators.   Antimicrobials:   Zithromax 5/20 -->  Vantin 5/20 -->   Subjective: Still with dyspnea at rest and with exertion, cough productive of clear and whitish sputum, night sweats.   Objective: Filed Vitals:   08/08/15 1520 08/08/15 1610 08/08/15 1955 08/09/15 0432  BP: 96/56 107/61 110/60 114/65  Pulse: 101 96 100 96  Temp:   98.4 F (36.9 C) 97.9 F (36.6 C)  TempSrc:   Oral Oral  Resp:   20 18  Weight:      SpO2:   91%     Intake/Output Summary (Last 24 hours) at 08/09/15 1015 Last data filed at 08/09/15 0544  Gross per 24 hour  Intake    300 ml  Output    150 ml  Net    150  ml   Filed Weights   08/05/15 0552 08/06/15 0634 08/08/15 0500  Weight: 60.963 kg (134 lb 6.4 oz) 60.374 kg (133 lb 1.6 oz) 61 kg (134 lb 7.7 oz)    Examination:  General exam: Appears calm and comfortable  Respiratory system: rhonchi at bases and diminished breath sounds at bases  Cardiovascular system: S1 & S2 heard, RRR. No JVD, murmurs, rubs, gallops or clicks. No pedal edema. Gastrointestinal system: Abdomen is nondistended, soft and nontender.  Central nervous system: Alert and oriented. No focal neurological deficits. Psychiatry: Judgement and insight appear normal.  Mood & affect appropriate.   Data Reviewed: I have personally reviewed following labs and imaging studies  CBC:  Recent Labs Lab 08/04/15 0332 08/05/15 0339 08/07/15 0250 08/08/15 0330 08/09/15 0338  WBC 10.0 10.1 11.3* 17.9* 15.0*  HGB 8.5* 8.7* 9.6* 8.6* 8.7*  HCT 26.9* 27.5* 29.9* 27.4* 27.9*  MCV 89.4 89.3 88.7 90.7 90.6  PLT 293 315 377 353 [id number]*   Basic Metabolic Panel:  Recent Labs Lab 08/05/15 0339 08/06/15 0300 08/07/15 0250 08/08/15 0330 08/09/15 0338  NA 137 138 136 133* 133*  K 2.8* 3.2* 3.4* 4.2 3.9  CL 103 103 99* 98* 96*  CO2 22 23 24 22 22   GLUCOSE 107* 100* 115* 104* 117*  BUN 35* 37* 39* 43* 62*  CREATININE 5.08* 4.85* 4.47* 4.67* 5.65*  CALCIUM 7.3* 7.6* 8.0* 8.1* 7.8*  PHOS  --   --   --   --  5.2*   Coagulation Profile:  Recent Labs Lab 08/07/15 2003  INR 1.31   Cardiac Enzymes:  Recent Labs Lab 08/07/15 0826 08/07/15 1403 08/07/15 2003  TROPONINI 0.06* 0.06* 0.06*   Urine analysis:    Component Value Date/Time   COLORURINE YELLOW 08/08/2015 0550   APPEARANCEUR CLOUDY* 08/08/2015 0550   LABSPEC 1.015 08/08/2015 0550   PHURINE 5.5 08/08/2015 0550   GLUCOSEU NEGATIVE 08/08/2015 0550   HGBUR TRACE* 08/08/2015 0550   BILIRUBINUR NEGATIVE 08/08/2015 0550   KETONESUR NEGATIVE 08/08/2015 0550   PROTEINUR 30* 08/08/2015 0550   UROBILINOGEN 0.2 12/21/2012 1300   NITRITE NEGATIVE 08/08/2015 0550   LEUKOCYTESUR NEGATIVE 08/08/2015 0550   Recent Results (from the past 240 hour(s))  Culture, blood (routine x 2) Call MD if unable to obtain prior to antibiotics being given     Status: None   Collection Time: 08/01/15  2:52 PM  Result Value Ref Range Status   Specimen Description BLOOD RIGHT HAND  Final   Special Requests IN PEDIATRIC BOTTLE 1CC  Final   Culture NO GROWTH 5 DAYS  Final   Report Status 08/06/2015 FINAL  Final  Culture, blood (routine x 2) Call MD if unable to obtain prior to antibiotics being given     Status: None    Collection Time: 08/01/15  2:57 PM  Result Value Ref Range Status   Specimen Description BLOOD RIGHT ANTECUBITAL  Final   Special Requests IN PEDIATRIC BOTTLE 1CC  Final   Culture NO GROWTH 5 DAYS  Final   Report Status 08/06/2015 FINAL  Final  Culture, sputum-assessment     Status: None   Collection Time: 08/02/15  5:27 AM  Result Value Ref Range Status   Specimen Description EXPECTORATED SPUTUM  Final   Special Requests NONE  Final   Sputum evaluation   Final    THIS SPECIMEN IS ACCEPTABLE. RESPIRATORY CULTURE REPORT TO FOLLOW.   Report Status 08/02/2015 FINAL  Final  Culture, respiratory (NON-Expectorated)  Status: None   Collection Time: 08/02/15  7:28 AM  Result Value Ref Range Status   Specimen Description SPUTUM  Final   Special Requests NONE  Final   Gram Stain   Final    FEW WBC FEW SQUAMOUS EPITHELIAL CELLS PRESENT FEW GRAM POSITIVE COCCI IN PAIRS Performed at [facility]    Culture   Final    NORMAL OROPHARYNGEAL FLORA Performed at [facility]    Report Status 08/05/2015 FINAL  Final  Culture, Urine     Status: Abnormal   Collection Time: 08/08/15  5:50 AM  Result Value Ref Range Status   Specimen Description URINE, RANDOM  Final   Special Requests cefpodoxime, azithromycin  Final   Culture MULTIPLE SPECIES PRESENT, SUGGEST RECOLLECTION (A)  Final   Report Status 08/09/2015 FINAL  Final      Radiology Studies: Ct Chest Wo Contrast 08/08/2015  Markedly abnormal pulmonary parenchyma which geographic areas of septal thickening and ground-glass opacity. This pattern was previously felt to be highly suggestive of alveolar proteinosis. However other diagnoses can also cause this pattern including atypical pneumonia, bronchoalveolar carcinoma, sarcoid, nonspecific interstitial pneumonia, cryptogenic organizing pneumonia, ARDS and pulmonary hemorrhage. Small amount of left pleural fluid with dependent atelectasis. Small to moderate pericardial  effusion. Atherosclerosis of the aorta and the coronary arteries. 1 cm right paratracheal lymph node just beneath the thoracic inlet. There is not an extensive pattern of lymphadenopathy.  Nm Pulmonary Perf And Vent 08/07/2015 Low probability pulmonary embolus.   Scheduled Meds: . azithromycin  500 mg Oral q1800  . cefpodoxime  200 mg Oral Daily  . citalopram  20 mg Oral Daily  . darbepoetin (ARANESP) injection - NON-DIALYSIS  60 mcg Subcutaneous Q Wed-1800  . [START ON 08/10/2015] furosemide  40 mg Oral Daily  . heparin  5,000 Units Subcutaneous Q8H  . levothyroxine  50 mcg Oral QAC breakfast  . linaclotide  145 mcg Oral Daily  . NIFEdipine  60 mg Oral Daily  . pantoprazole  40 mg Oral Daily  . pravastatin  80 mg Oral QHS  . saccharomyces boulardii  250 mg Oral BID  . sodium chloride flush  3 mL Intravenous Q12H  . sodium chloride flush  3 mL Intravenous Q12H  . sodium chloride flush  3 mL Intravenous Q12H  . sodium chloride flush  3 mL Intravenous Q12H   Continuous Infusions:     LOS: 7 days    Time spent: 20 minutes    Faye Ramsay, MD [facility] Pager [phone]  If 7PM-7AM, please contact night-coverage [email or web address] Password [email or web address] 08/09/2015, 10:15 AM

## 2015-08-09 NOTE — Care Management Important Message (Signed)
Important Message  Patient Details  Name: [name] MRN: [id number] Date of Birth: [birth year]   Medicare Important Message Given:  Yes    Loann Quill 08/09/2015, 12:28 PM

## 2015-08-09 NOTE — Consult Note (Signed)
Name: [name] MRN: [id number] DOB: 06-Jun-1934    ADMISSION DATE:  08/01/2015 CONSULTATION DATE:  08/09/2015  REFERRING MD :  Doyle Askew  CHIEF COMPLAINT:  SOB  BRIEF PATIENT DESCRIPTION: 81 year old female admitted with SOB which has not responded to effective diuresis and IV ABX. Diffuse bilateral infiltrates on CT.  SIGNIFICANT EVENTS  5/17 admit 09-07-2022 RHC 5/25 Pulm consult  STUDIES:  5/23 V/Q > low prob PE Sep 07, 2022 [facility] > 2022/09/07 CT Chest > Markedly abnormal pulmonary parenchyma which geographic areas of septal thickening and ground-glass opacity. Small amount of left pleural fluid with dependent atelectasis. Small to moderate pericardial effusion. Atherosclerosis of the aorta and the coronary arteries. 1 cm right paratracheal lymph node just beneath the thoracic inlet. There is not an extensive pattern of lymphadenopathy.  HISTORY OF PRESENT ILLNESS:  81 year old female with PMH as below, which includes CAD, HTN, AAA, and stage 4 CKD. She has a very brief smoking history during which she smoked 2 cigarettes per day for 2-3 years and quit in 2008. In her USOH she lives with her husband and they are very independent. She started developing fatigue, malaise, and "head cold " 5/12. 5/15 she went to PCP where she was diagnosed with sinus infection and stated on amoxicillin. 5/16 she developed progressive SOB and cough productive for yellow sputum. She presented to ED with these complaints and was admitted to the hospitalist team who treated her for CAP and CHF. She was treated with IV ABX and aggressive diuresis, however, her clinical improvement has not been impressive. Since that time. 2022/09/07 she underwent R heart cath and CT. RHC results still pending, and CT chest demonstrated diffuse bilateral GGO. Pulmonary consulted for further eval. Of note, she has no history of autoimmune disease, no drug use, and acid reflux only about once per week. She has no recent travel or exotic pets/bird exposure.    PAST MEDICAL HISTORY :   has a past medical history of CAD (coronary artery disease); Hypertension; Abdominal aortic aneurysm ([place]); Renal artery stenosis ([place]); Dyslipidemia; Hypothyroidism; Tobacco user; Adenomatous polyp (2004, 2011); Hyperplastic colon polyp (2011); Hemorrhoids; Diverticulosis; Renal insufficiency; Anemia; Myocardial infarction ([place]); Constipation; Cataract; and AAA (abdominal aortic aneurysm) ([place]).  has past surgical history that includes Abdominal hysterectomy; Carpal tunnel release (Bilateral); Cholecystectomy; Flexible sigmoidoscopy (02/25/2012); Hemorrhoid banding (02/25/2012); Abdominal angiogram (12/21/2012); Abdominal aortic endovascular stent graft (N/A, 12/22/2012); abdominal angiogram (N/A, 11/30/2012); Cataract extraction, bilateral; Colonoscopy; Polypectomy; Cardiac catheterization (2008); AV fistula placement (Left, 04/20/2015); and Cardiac catheterization (N/A, 09/07/15). Prior to Admission medications   Medication Sig Start Date End Date Taking? Authorizing Provider  acetaminophen (TYLENOL) 325 MG tablet Take 162.5-325 mg by mouth every 6 (six) hours as needed (For pain.).    Yes Historical Provider, MD  aspirin EC 81 MG tablet Take 81 mg by mouth daily.   Yes Historical Provider, MD  citalopram (CELEXA) 20 MG tablet Take 20 mg by mouth daily.  12/16/10  Yes Historical Provider, MD  cyanocobalamin (,VITAMIN B-12,) 1000 MCG/ML injection Inject 1,000 mcg into the muscle every 30 (thirty) days.    Yes Historical Provider, MD  fluticasone (FLONASE) 50 MCG/ACT nasal spray Place 1 spray into both nostrils daily as needed for allergies.    Yes Historical Provider, MD  furosemide (LASIX) 40 MG tablet Take 1 tablet (40 mg total) by mouth daily. *MUST MAKE APPOINTMENT* 07/28/14  Yes Minus Breeding, MD  hydrocortisone (ANUSOL-HC) 25 MG suppository Place 25 mg rectally at bedtime as needed  for hemorrhoids.    Yes Historical Provider, MD  levothyroxine (SYNTHROID, LEVOTHROID) 50  MCG tablet Take 50 mcg by mouth daily.    Yes Historical Provider, MD  Linaclotide Rolan Lipa) 145 MCG CAPS capsule Take 1 capsule (145 mcg total) by mouth daily. 06/07/15  Yes Milus Banister, MD  meclizine (ANTIVERT) 25 MG tablet Take 1 tablet (25 mg total) by mouth 3 (three) times daily as needed for dizziness. 08/14/14  Yes Veryl Speak, MD  Multiple Vitamins-Minerals (OCUVITE PRESERVISION PO) Take 1 tablet by mouth daily.   Yes Historical Provider, MD  NIFEdipine (PROCARDIA XL/ADALAT-CC) 60 MG 24 hr tablet TAKE 1 TABLET (60 MG TOTAL) BY MOUTH DAILY. PATIENT NEEDS TO CONTACT OFFICE FOR ADDITIONAL REFILLS 07/12/15  Yes Minus Breeding, MD  nitroGLYCERIN (NITROSTAT) 0.4 MG SL tablet Place 0.4 mg under the tongue every 5 (five) minutes as needed for chest pain.   Yes Historical Provider, MD  omeprazole (PRILOSEC) 20 MG capsule Take 20 mg by mouth daily as needed (For heartburn or acid reflux.).  07/13/13  Yes Historical Provider, MD  Oxymetazoline HCl (NASAL DECONGESTANT SPRAY NA) Place 1 spray into the nose daily as needed (For congestion.).  06/22/14  Yes Historical Provider, MD  pravastatin (PRAVACHOL) 80 MG tablet Take 80 mg by mouth at bedtime.  03/29/12  Yes Historical Provider, MD  traMADol (ULTRAM) 50 MG tablet Take 1 tablet (50 mg total) by mouth every 6 (six) hours as needed. Patient taking differently: Take 50 mg by mouth every 6 (six) hours as needed for moderate pain.  04/20/15  Yes Alvia Grove, PA-C   Allergies  Allergen Reactions  . Codeine Rash  . Sulfonamide Derivatives Hives    FAMILY HISTORY:  family history includes Breast cancer in her maternal aunt; Heart attack (age of onset: 66) in her brother; Heart attack (age of onset: 25) in her father; Heart disease in her brother, father, and sister; Lung cancer in her brother; Other in her sister; Peripheral vascular disease in her sister; Uterine cancer in her mother. There is no history of Colon cancer. SOCIAL HISTORY:  reports that  she quit smoking about 9 years ago. Her smoking use included Cigarettes. She quit after 1 year of use. She has never used smokeless tobacco. She reports that she does not drink alcohol or use illicit drugs.  REVIEW OF SYSTEMS:   Bolds are positive  Constitutional: weight loss, gain, night sweats, Fevers, chills, fatigue .  HEENT: headaches, Sore throat, sneezing, nasal congestion, post nasal drip, Difficulty swallowing, Tooth/dental problems, visual complaints visual changes, ear ache CV:  chest pain, radiates: ,Orthopnea, PND, swelling in lower extremities, dizziness, palpitations, syncope.  GI  heartburn, indigestion, abdominal pain, nausea, vomiting, diarrhea, change in bowel habits, loss of appetite, bloody stools.  Resp: cough, productive: , hemoptysis, dyspnea, chest pain, pleuritic.  Skin: rash or itching or icterus GU: dysuria, change in color of urine, urgency or frequency. flank pain, hematuria  MS: joint pain or swelling. decreased range of motion  Psych: change in mood or affect. depression or anxiety.  Neuro: difficulty with speech, weakness, numbness, ataxia    SUBJECTIVE:   VITAL SIGNS: Temp:  [97.5 F (36.4 C)-98.4 F (36.9 C)] 97.9 F (36.6 C) (05/25 0432) Pulse Rate:  [32-183] 96 (05/25 0432) Resp:  [0-30] 18 (05/25 0432) BP: (94-115)/(31-71) 114/65 mmHg (05/25 0432) SpO2:  [0 %-100 %] 91 % (05/24 1955)  PHYSICAL EXAMINATION: General:  Elderly female in NAD Neuro:  Alert, oriented, non-focal HEENT:  [place]/AT, PERRL, no JVD Cardiovascular:  RRR, no MRG, no JVD Lungs: faint rhonchi Abdomen:  Soft, non-tender, non-distended Musculoskeletal:  No acute deformity or ROM limitation Skin:  Grossly intact   Recent Labs Lab 08/07/15 0250 08/08/15 0330 08/09/15 0338  NA 136 133* 133*  K 3.4* 4.2 3.9  CL 99* 98* 96*  CO2 24 22 22   BUN 39* 43* 62*  CREATININE 4.47* 4.67* 5.65*  GLUCOSE 115* 104* 117*    Recent Labs Lab 08/07/15 0250 08/08/15 0330  08/09/15 0338  HGB 9.6* 8.6* 8.7*  HCT 29.9* 27.4* 27.9*  WBC 11.3* 17.9* 15.0*  PLT 377 353 410*   Ct Chest Wo Contrast  08/08/2015  CLINICAL DATA:  Persistent infiltrates and shortness of breath. EXAM: CT CHEST WITHOUT CONTRAST TECHNIQUE: Multidetector CT imaging of the chest was performed following the standard protocol without IV contrast. COMPARISON:  Radiography 08/07/2011 an multiple previous chest radiographs. FINDINGS: Throughout both lungs, there are geographic areas of septal thickening and ground-glass opacity in a patchy distribution. Some lobules are affected an others are not. Most common cause of this pattern is alveolar proteinosis. Other causes can include pneumocystis pneumonia, bronchoalveolar carcinoma, sarcoid, nonspecific interstitial pneumonia and cryptogenic organizing pneumonia. Adult respiratory distress syndrome and pulmonary hemorrhage can also cause this. There is atherosclerosis of the aorta and of the coronary arteries. There is a small amount of dependent pleural fluid on the left with minimal dependent atelectasis. There is a small to moderate amount of pericardial fluid. Small hiatal hernia within the inferior mediastinum. 1 cm right paratracheal lymph node just below the thoracic inlet. No significant bone finding. No significant finding in the upper abdomen. IMPRESSION: Markedly abnormal pulmonary parenchyma which geographic areas of septal thickening and ground-glass opacity. This pattern was previously felt to be highly suggestive of alveolar proteinosis. However other diagnoses can also cause this pattern including atypical pneumonia, bronchoalveolar carcinoma, sarcoid, nonspecific interstitial pneumonia, cryptogenic organizing pneumonia, ARDS and pulmonary hemorrhage. Small amount of left pleural fluid with dependent atelectasis. Small to moderate pericardial effusion. Atherosclerosis of the aorta and the coronary arteries. 1 cm right paratracheal lymph node just  beneath the thoracic inlet. There is not an extensive pattern of lymphadenopathy. Electronically Signed   By: Nelson Chimes M.D.   On: 08/08/2015 20:01   Nm Pulmonary Perf And Vent  08/07/2015  CLINICAL DATA:  Chest pain.  Shortness of breath. EXAM: NUCLEAR MEDICINE VENTILATION - PERFUSION LUNG SCAN TECHNIQUE: Ventilation images were obtained in multiple projections using inhaled aerosol Tc-57mDTPA. Perfusion images were obtained in multiple projections after intravenous injection of Tc-965mAA. RADIOPHARMACEUTICALS:  30.5 mCi Technetium-9976mPA aerosol inhalation and 4.1 mCi Technetium-59m24m IV COMPARISON:  Chest x-ray 08/07/2015. FINDINGS: Extremely poor ventilation bilaterally consistent with previously identified bilateral pulmonary edema and/or pneumonia. No prominent perfusion defects noted. Tiny perfusion defects noted are smaller than ventilatory defects. IMPRESSION: Low probability pulmonary embolus. Electronically Signed   By: ThomMarcello Mooresgister   On: 08/07/2015 12:33    ASSESSMENT / PLAN:  Dyspnea secondary to diffuse pulmonary infiltrates - etiology uncertain at this time as differential is broad and includes autoimmune disease (sarcoid, RA), pulmonary fibrosis, COP, BOOP, pulmonary edema, and viral pneumonitis. Particularly considering that she has failed ABX and diuresis.   - Supplemental O2 as needed to maintain SpO2 > 92% - Check ESR, CRP, RF, ANA, ANCA, ACE - Check CBC with diff to evaluate for eosinophilia  - Full respiratory viral panel - PPI daily as ordered - Defer steroids  pending autoimmune examinations.   Acute diastolic CHF HTN CAD - Follow RHC results - Per cards  CKD stage IV - Per nephrology  Georgann Housekeeper, AGACNP-BC [facility] Pager [phone] or [phone]  Attending Note:  81 year old female with PMH above presenting with SOB and diffuse pulmonary infiltrate.  On exam, lungs with bibasilar atelectasis.  I reviewed CT myself,  pulmonary interstitial infiltrate diffusely but upper>lower.  Discussed with PCCM-NP and TRH-MD.  Pulmonary interstitial infiltrate: DDx as above.  - ESR, CRP, RF, ANA, ANCA and ACE.  - Respiratory viral panel.  - If viral panel is negative and any above is positive then will start steroids.  - May need biopsy but would not do that at this stage.  - No need for steroids for now.  Acute pulmonary edema:  - Diureses.  - Limit IVF.  Hypoxemia:  - Titrate O2 for sat of 88-92%.  - Ambulatory desat study prior to discharge for home O2.  CAD: causing pulmonary edema.  - Cards follow.  - Keep dry.  PCCM will follow with you.  Patient seen and examined, agree with above note.  I dictated the care and orders written for this patient under my direction.  Rush Farmer, MD [phone]  08/09/2015 11:58 AM

## 2015-08-09 NOTE — Care Management Important Message (Signed)
Important Message  Patient Details  Name: [name] MRN: [id number] Date of Birth: 08-15-1934   Medicare Important Message Given:  Yes    [name] May 08/09/2015, 11:41 AM

## 2015-08-10 ENCOUNTER — Inpatient Hospital Stay (HOSPITAL_COMMUNITY): Payer: Medicare HMO

## 2015-08-10 DIAGNOSIS — J984 Other disorders of lung: Secondary | ICD-10-CM | POA: Insufficient documentation

## 2015-08-10 DIAGNOSIS — I5021 Acute systolic (congestive) heart failure: Secondary | ICD-10-CM

## 2015-08-10 DIAGNOSIS — J189 Pneumonia, unspecified organism: Secondary | ICD-10-CM | POA: Insufficient documentation

## 2015-08-10 LAB — RESPIRATORY PANEL BY PCR
ADENOVIRUS-RVPPCR: NOT DETECTED
Bordetella pertussis: NOT DETECTED
CHLAMYDOPHILA PNEUMONIAE-RVPPCR: NOT DETECTED
CORONAVIRUS 229E-RVPPCR: NOT DETECTED
CORONAVIRUS OC43-RVPPCR: NOT DETECTED
Coronavirus HKU1: NOT DETECTED
Coronavirus NL63: NOT DETECTED
INFLUENZA A H1-RVPPCR: NOT DETECTED
INFLUENZA A-RVPPCR: NOT DETECTED
Influenza A H1 2009: NOT DETECTED
Influenza A H3: NOT DETECTED
Influenza B: NOT DETECTED
Metapneumovirus: NOT DETECTED
Mycoplasma pneumoniae: NOT DETECTED
PARAINFLUENZA VIRUS 4-RVPPCR: NOT DETECTED
Parainfluenza Virus 1: NOT DETECTED
Parainfluenza Virus 2: NOT DETECTED
Parainfluenza Virus 3: NOT DETECTED
RESPIRATORY SYNCYTIAL VIRUS-RVPPCR: NOT DETECTED
RHINOVIRUS / ENTEROVIRUS - RVPPCR: DETECTED — AB

## 2015-08-10 LAB — RENAL FUNCTION PANEL
ALBUMIN: 2.3 g/dL — AB (ref 3.5–5.0)
Anion gap: 13 (ref 5–15)
BUN: 65 mg/dL — AB (ref 6–20)
CHLORIDE: 99 mmol/L — AB (ref 101–111)
CO2: 24 mmol/L (ref 22–32)
CREATININE: 6.04 mg/dL — AB (ref 0.44–1.00)
Calcium: 7.8 mg/dL — ABNORMAL LOW (ref 8.9–10.3)
GFR calc Af Amer: 7 mL/min — ABNORMAL LOW (ref >60)
GFR, EST NON AFRICAN AMERICAN: 6 mL/min — AB (ref >60)
GLUCOSE: 97 mg/dL (ref 65–99)
PHOSPHORUS: 5.9 mg/dL — AB (ref 2.5–4.6)
Potassium: 4.1 mmol/L (ref 3.5–5.1)
Sodium: 136 mmol/L (ref 135–145)

## 2015-08-10 LAB — CBC WITH DIFFERENTIAL/PLATELET
BASOS ABS: 0 10*3/uL (ref 0.0–0.1)
Basophils Relative: 0 %
Eosinophils Absolute: 0.3 10*3/uL (ref 0.0–0.7)
Eosinophils Relative: 3 %
HEMATOCRIT: 26.1 % — AB (ref 36.0–46.0)
HEMOGLOBIN: 8.1 g/dL — AB (ref 12.0–15.0)
LYMPHS ABS: 1 10*3/uL (ref 0.7–4.0)
LYMPHS PCT: 9 %
MCH: 28.6 pg (ref 26.0–34.0)
MCHC: 31 g/dL (ref 30.0–36.0)
MCV: 92.2 fL (ref 78.0–100.0)
Monocytes Absolute: 0.6 10*3/uL (ref 0.1–1.0)
Monocytes Relative: 5 %
NEUTROS ABS: 9.6 10*3/uL — AB (ref 1.7–7.7)
Neutrophils Relative %: 83 %
Platelets: 397 10*3/uL (ref 150–400)
RBC: 2.83 MIL/uL — AB (ref 3.87–5.11)
RDW: 14.8 % (ref 11.5–15.5)
WBC: 11.5 10*3/uL — AB (ref 4.0–10.5)

## 2015-08-10 LAB — ANCA TITERS

## 2015-08-10 LAB — RHEUMATOID FACTOR: RHEUMATOID FACTOR: 21.3 [IU]/mL — AB (ref 0.0–13.9)

## 2015-08-10 LAB — ANGIOTENSIN CONVERTING ENZYME: Angiotensin-Converting Enzyme: 22 U/L (ref 14–82)

## 2015-08-10 MED ORDER — INSULIN ASPART 100 UNIT/ML ~~LOC~~ SOLN
0.0000 [IU] | Freq: Three times a day (TID) | SUBCUTANEOUS | Status: DC
  Administered 2015-08-11: 1 [IU] via SUBCUTANEOUS
  Administered 2015-08-11: 3 [IU] via SUBCUTANEOUS
  Administered 2015-08-12: 2 [IU] via SUBCUTANEOUS
  Administered 2015-08-13: 1 [IU] via SUBCUTANEOUS
  Administered 2015-08-15: 3 [IU] via SUBCUTANEOUS

## 2015-08-10 MED ORDER — METHYLPREDNISOLONE SODIUM SUCC 40 MG IJ SOLR
40.0000 mg | Freq: Two times a day (BID) | INTRAMUSCULAR | Status: DC
  Administered 2015-08-10 – 2015-08-15 (×10): 40 mg via INTRAVENOUS
  Filled 2015-08-10 (×10): qty 1

## 2015-08-10 NOTE — Progress Notes (Signed)
Patient ID: [name], female   DOB: February 15, 1935, 81 y.o.   MRN: [id number]  [facility] Progress Note   Assessment/ Plan:   1. AKI on chronic kidney disease stage V (baseline creatinine 3.6-3.9). Suspected to be hemodynamically mediated with possible intravascular contraction-renal function worse however without any uremic symptoms or critical electrolytes to prompt need for dialysis. She has a left brachiocephalic fistula in place suitable for cannulation if and when needed for dialysis. We'll continue close assessment. 2. Acute exacerbation of diastolic heart failure: Appears to be compensated per RHC report- continue oral Lasix 40mg  daily. 3. Hypoxia: CT scan reviewed and with concerning parenchymal pattern-- ongoing evaluation by pulmonary, corticosteroid therapy deferred until final diagnosis made. 4. Anemia: With iron deficiency (iron saturation 5% with ferritin 52), status post 2 units PRBC. Status post intravenous iron and now on Aranesp 5. Atypical pneumonia: On antimicrobial coverage with azithromycin/cefpodoxime-respiratory cultures to direct further management.  Subjective:   She reports that she is doing fair and without any nausea, vomiting, dysgeusia, chest pain or tremors. Oriented 3.    Objective:   BP 99/56 mmHg  Pulse 94  Temp(Src) 98 F (36.7 C) (Oral)  Resp 20  Wt 60 kg (132 lb 4.4 oz)  SpO2 96%  Intake/Output Summary (Last 24 hours) at 08/10/15 0939 Last data filed at 08/10/15 0516  Gross per 24 hour  Intake    360 ml  Output    850 ml  Net   -490 ml   Weight change:   Physical Exam: [id number] comfortable resting in bed-oriented and without asterixis. CVS: Pulse regular rhythm, normal rate Resp: Coarse breath sounds bilaterally with intermittent expiratory wheeze/rhonchi Abd: Soft, flat, nontender Ext: No ankle edema. Left brachiocephalic fistula with pulsatile thrill.  Imaging: Ct Chest Wo Contrast  08/08/2015  CLINICAL DATA:   Persistent infiltrates and shortness of breath. EXAM: CT CHEST WITHOUT CONTRAST TECHNIQUE: Multidetector CT imaging of the chest was performed following the standard protocol without IV contrast. COMPARISON:  Radiography 08/07/2011 an multiple previous chest radiographs. FINDINGS: Throughout both lungs, there are geographic areas of septal thickening and ground-glass opacity in a patchy distribution. Some lobules are affected an others are not. Most common cause of this pattern is alveolar proteinosis. Other causes can include pneumocystis pneumonia, bronchoalveolar carcinoma, sarcoid, nonspecific interstitial pneumonia and cryptogenic organizing pneumonia. Adult respiratory distress syndrome and pulmonary hemorrhage can also cause this. There is atherosclerosis of the aorta and of the coronary arteries. There is a small amount of dependent pleural fluid on the left with minimal dependent atelectasis. There is a small to moderate amount of pericardial fluid. Small hiatal hernia within the inferior mediastinum. 1 cm right paratracheal lymph node just below the thoracic inlet. No significant bone finding. No significant finding in the upper abdomen. IMPRESSION: Markedly abnormal pulmonary parenchyma which geographic areas of septal thickening and ground-glass opacity. This pattern was previously felt to be highly suggestive of alveolar proteinosis. However other diagnoses can also cause this pattern including atypical pneumonia, bronchoalveolar carcinoma, sarcoid, nonspecific interstitial pneumonia, cryptogenic organizing pneumonia, ARDS and pulmonary hemorrhage. Small amount of left pleural fluid with dependent atelectasis. Small to moderate pericardial effusion. Atherosclerosis of the aorta and the coronary arteries. 1 cm right paratracheal lymph node just beneath the thoracic inlet. There is not an extensive pattern of lymphadenopathy. Electronically Signed   By: Nelson Chimes M.D.   On: 08/08/2015 20:01     Labs: BMET  Recent Labs Lab 08/04/15 0332 08/05/15 0339 08/06/15 0300  08/07/15 0250 08/08/15 0330 08/09/15 0338 08/10/15 0634  NA 138 137 138 136 133* 133* 136  K 2.9* 2.8* 3.2* 3.4* 4.2 3.9 4.1  CL 102 103 103 99* 98* 96* 99*  CO2 22 22 23 24 22 22 24   GLUCOSE 104* 107* 100* 115* 104* 117* 97  BUN 33* 35* 37* 39* 43* 62* 65*  CREATININE 5.09* 5.08* 4.85* 4.47* 4.67* 5.65* 6.04*  CALCIUM 7.2* 7.3* 7.6* 8.0* 8.1* 7.8* 7.8*  PHOS  --   --   --   --   --  5.2* 5.9*   CBC  Recent Labs Lab 08/07/15 0250 08/08/15 0330 08/09/15 0338 08/10/15 0634  WBC 11.3* 17.9* 15.0* 11.5*  NEUTROABS  --   --   --  9.6*  HGB 9.6* 8.6* 8.7* 8.1*  HCT 29.9* 27.4* 27.9* 26.1*  MCV 88.7 90.7 90.6 92.2  PLT 377 353 410* 397   Medications:    . azithromycin  500 mg Oral q1800  . cefpodoxime  200 mg Oral Daily  . citalopram  20 mg Oral Daily  . darbepoetin (ARANESP) injection - NON-DIALYSIS  60 mcg Subcutaneous Q Wed-1800  . furosemide  40 mg Oral Daily  . heparin  5,000 Units Subcutaneous Q8H  . levothyroxine  50 mcg Oral QAC breakfast  . linaclotide  145 mcg Oral Daily  . NIFEdipine  60 mg Oral Daily  . pantoprazole  40 mg Oral Daily  . pravastatin  80 mg Oral QHS  . saccharomyces boulardii  250 mg Oral BID  . sodium chloride flush  3 mL Intravenous Q12H  . sodium chloride flush  3 mL Intravenous Q12H  . sodium chloride flush  3 mL Intravenous Q12H  . sodium chloride flush  3 mL Intravenous Q12H   Elmarie Shiley, MD 08/10/2015, 9:39 AM

## 2015-08-10 NOTE — Progress Notes (Signed)
Name: [name] MRN: [id number] DOB: Jun 27, 1934    ADMISSION DATE:  08/01/2015 CONSULTATION DATE:  08/09/2015  REFERRING MD :  Doyle Askew  CHIEF COMPLAINT:  SOB  BRIEF PATIENT DESCRIPTION: 81 year old female admitted with SOB which has not responded to effective diuresis and IV ABX. Diffuse bilateral infiltrates on CT.  SUBJECTIVE: Pt anxious to figure out what is wrong with her lungs.  Reports ongoing cough with yellow sputum production.   VITAL SIGNS: Temp:  [97.9 F (36.6 C)-98.4 F (36.9 C)] 98 F (36.7 C) (05/26 0512) Pulse Rate:  [94] 94 (05/25 1943) Resp:  [17-20] 20 (05/26 0512) BP: (99-115)/(56-59) 99/56 mmHg (05/26 0512) SpO2:  [91 %-96 %] 96 % (05/26 0512) Weight:  [132 lb 4.4 oz (60 kg)] 132 lb 4.4 oz (60 kg) (05/26 0500)  PHYSICAL EXAMINATION: General:  Elderly female in NAD lying in bed Neuro:  Alert, oriented, non-focal HEENT:  [place]/AT, PERRL, no JVD Cardiovascular:  RRR, no MRG, no JVD Lungs: even/non-labored on 3L, lungs bilaterally with rhonchi, mild crackles lower lobes Abdomen:  Soft, non-tender, non-distended Musculoskeletal:  No acute deformity or ROM limitation Skin:  Grossly intact   Recent Labs Lab Aug 09, 2015 0330 08/09/15 0338 08/10/15 0634  NA 133* 133* 136  K 4.2 3.9 4.1  CL 98* 96* 99*  CO2 22 22 24   BUN 43* 62* 65*  CREATININE 4.67* 5.65* 6.04*  GLUCOSE 104* 117* 97    Recent Labs Lab 2015-08-09 0330 08/09/15 0338 08/10/15 0634  HGB 8.6* 8.7* 8.1*  HCT 27.4* 27.9* 26.1*  WBC 17.9* 15.0* 11.5*  PLT 353 410* 397   Ct Chest Wo Contrast  2015/08/09  CLINICAL DATA:  Persistent infiltrates and shortness of breath. EXAM: CT CHEST WITHOUT CONTRAST TECHNIQUE: Multidetector CT imaging of the chest was performed following the standard protocol without IV contrast. COMPARISON:  Radiography 08/07/2011 an multiple previous chest radiographs. FINDINGS: Throughout both lungs, there are geographic areas of septal thickening and ground-glass opacity  in a patchy distribution. Some lobules are affected an others are not. Most common cause of this pattern is alveolar proteinosis. Other causes can include pneumocystis pneumonia, bronchoalveolar carcinoma, sarcoid, nonspecific interstitial pneumonia and cryptogenic organizing pneumonia. Adult respiratory distress syndrome and pulmonary hemorrhage can also cause this. There is atherosclerosis of the aorta and of the coronary arteries. There is a small amount of dependent pleural fluid on the left with minimal dependent atelectasis. There is a small to moderate amount of pericardial fluid. Small hiatal hernia within the inferior mediastinum. 1 cm right paratracheal lymph node just below the thoracic inlet. No significant bone finding. No significant finding in the upper abdomen. IMPRESSION: Markedly abnormal pulmonary parenchyma which geographic areas of septal thickening and ground-glass opacity. This pattern was previously felt to be highly suggestive of alveolar proteinosis. However other diagnoses can also cause this pattern including atypical pneumonia, bronchoalveolar carcinoma, sarcoid, nonspecific interstitial pneumonia, cryptogenic organizing pneumonia, ARDS and pulmonary hemorrhage. Small amount of left pleural fluid with dependent atelectasis. Small to moderate pericardial effusion. Atherosclerosis of the aorta and the coronary arteries. 1 cm right paratracheal lymph node just beneath the thoracic inlet. There is not an extensive pattern of lymphadenopathy. Electronically Signed   By: Nelson Chimes M.D.   On: 09-Aug-2015 20:01   SIGNIFICANT EVENTS  5/17 admit Aug 09, 2022 RHC 5/25 Pulm consult  STUDIES:  5/23 V/Q > low prob PE Aug 09, 2022 RHC > mild PAH (likely group 3), normal PCWP and CO, resting hypoxemia.  No role for pulmonary  vasodilators.  5/24 CT Chest > Markedly abnormal pulmonary parenchyma which geographic areas of septal thickening and ground-glass opacity. Small amount of left pleural fluid with  dependent atelectasis. Small to moderate pericardial effusion. Atherosclerosis of the aorta and the coronary arteries. 1 cm right paratracheal lymph node just beneath the thoracic inlet. There is not an extensive pattern of lymphadenopathy.  AUTOIMMUNE:  5/25  ESR >> 122 CRP >> 34.1  RF >> 21.3  ANA >>  ANCA >> ACE >> 22 Eosinophils >> 0.03 (nml)  CULTURES:  BCx2 5/17 >>  Sputum 5/18 >> normal flora  RVP 5/25 >>   ASSESSMENT / PLAN:  Dyspnea secondary to diffuse pulmonary infiltrates - etiology uncertain at this time as differential is broad and includes autoimmune disease (sarcoid, RA), pulmonary fibrosis, COP, BOOP, pulmonary edema, and viral pneumonitis. Particularly considering that she has failed ABX and diuresis.  Acute Hypoxemic Respiratory Failure   Plan: - Supplemental O2 as needed to maintain SpO2 > 92% - Follow autoimmune panel ESR, CRP, RF, ANA, ANCA, ACE - Follow respiratory viral panel - If viral panel negative, begin steroids - May require lung biopsy - PPI daily as ordered, consider swallow evaluation (pt denies symptoms) - Defer steroids pending autoimmune examinations.  - Assess ambulatory O2 needs prior to discharge  - Intermittent CXR   Acute diastolic CHF HTN CAD Mild PH Pulmonary Edema   Plan: - RHC as above - Per cards  CKD stage IV  Plan: - Per nephrology   PCCM will follow with you.  Noe Gens, NP-C [facility] Pgr: [phone] or if no answer [phone] 08/10/2015, 11:49 AM   STAFF NOTE: I, Merrie Roof, MD FACP have personally reviewed patient's available data, including medical history, events of note, physical examination and test results as part of my evaluation. I have discussed with resident/NP and other care providers such as pharmacist, RN and RRT. In addition, I personally evaluated patient and elicited key findings of: coarse BS, no distress, reports about the same some increased sob with exertion,  crp, esr, ra concerning for autoimmune etiology , with stagnent symptoms would add low dose steroids for now and observe her symptoms and follow up anca etc, likely AKI is same etiology as outpt, i dont see active sediment in UA, with rising crt, another reason to consider steroid start, pcxr in am, follow anca, ensure c3 c4 done, await viral panel, likely to dc all abx soon, assess pct trend, if vasculitis pulm-renal syndomr enoted owuld need much higher doses steroids, starting empiric low today  [name]. Titus Mould, MD, [place] Pgr: [facility] 08/10/2015 1:35 PM

## 2015-08-10 NOTE — Progress Notes (Signed)
Patient ID: [name], female   DOB: [birth year], 81 y.o.   MRN: [id number]    PROGRESS NOTE    [name]  [id number] DOB: [birth year] DOA: 08/01/2015  PCP: Sherrie Mustache, MD   Brief Narrative:  81 year old Caucasian female with known coronary artery disease, hypertension, hyperlipidemia, hypothyroidism, anemia, presented with shortness of breath. Patient was initially started on IV antibiotics but rather slow to improve. Her creatinine worsened. Chest x-ray showed persistent infiltrates. She was given high dose Lasix. Nephrology was consulted. Patient however did not improve as anticipated. Cardiology was subsequently consulted.  Assessment & Plan:  AKI on chronic kidney disease stage V - baseline creatinine 3.6-3.9 - hemodynamically mediated by acute exacerbation of diastolic heart failure - dose of lasix was lowered from 80 mg to 40 mg IV BID and Cr is trending up  - appreciate nephrology team assistance   Acute on chronic systolic CHF - reduced EF of 45-50%, RHC unremarkable  - no signs of volume overload on exam  - appreciate cardiology team following, they signed off as no further cardiac eval or intervention needed   Acute respiratory distress with hypoxia  - appears to be pulmonary in etiology  - pt still with mild discomfort even at rest due to dyspnea, unable to take off oxygen nasal canula  - CT chest with diffuse ground glass opacities bilaterally with broad differential including sarcoidosis, PNA, malignancy  - PCCM consulted for assistance, will follow up on recommendations   Hypokalemia - Secondary to renal wasting from recent increase in diuretic therapy - WNL this AM - BMP in AM  Hypothyroidism - continue synthroid   HLD - continue statin   Thrombocytosis - reactive, resolved   - CBC in AM  Anemia of iron deficiency  - iron saturation 5% with ferritin 52, status post 2 units PRBC. Status post intravenous iron - aranesp started per  nephrology   DVT prophylaxis: Heparin SQ  Code Status: Full   Family Communication: Patient and husband at bedside, reviewed CT images in detail including review of CT scans from 2015 for comparison. Pt is agreement with PCCM consultation for further assistance.   Disposition Plan: pending right heart cath and CT chest   Consultants:   Cardiology - signed off on 5/25   PCCM consulted 5/25   Procedures:   Right heart cath 5/24 --> see below   Findings: RA = 10 RV = 37/10 PA = 41/10 (22) PCW = 8 Fick cardiac output/index = 4.8/2.8 PVR = 2.9 WU FA sat = 89% on 3L PA sat = 47%, 51%  Assessment: 1. Mild PAH (likely WHO Group 3) 2. Normal PCWP and cardiac output 3. Resting hypoxemia 4. Suspect dyspnea and mild PAH due to intrinsic lung disease. Left side pressures are normal. PVR normal.  5. No role for selective pulmonary vasodilators.   Antimicrobials:   Zithromax 5/20 -->  Vantin 5/20 -->   Subjective: Still with dyspnea at rest and with exertion, cough productive of clear sputum.  Objective: Filed Vitals:   08/09/15 1536 08/09/15 1943 08/10/15 0500 08/10/15 0512  BP: 115/59 105/57  99/56  Pulse: 94 94    Temp: 98.4 F (36.9 C) 97.9 F (36.6 C)  98 F (36.7 C)  TempSrc: Oral Oral  Oral  Resp: 17 18  20   Weight:   60 kg (132 lb 4.4 oz)   SpO2: 91% 92%  96%    Intake/Output Summary (Last 24 hours) at 08/10/15 [id number] Last data filed  at 08/10/15 0516  Gross per 24 hour  Intake    600 ml  Output    850 ml  Net   -250 ml   Filed Weights   08/06/15 0634 08/08/15 0500 08/10/15 0500  Weight: 60.374 kg (133 lb 1.6 oz) 61 kg (134 lb 7.7 oz) 60 kg (132 lb 4.4 oz)    Examination:  General exam: Appears calm and comfortable  Respiratory system: rhonchi at bases and diminished breath sounds at bases  Cardiovascular system: S1 & S2 heard, RRR. No JVD, murmurs, rubs, gallops or clicks. No pedal edema. Gastrointestinal system: Abdomen is nondistended, soft and  nontender.  Central nervous system: Alert and oriented. No focal neurological deficits. Psychiatry: Judgement and insight appear normal. Mood & affect appropriate.   Data Reviewed: I have personally reviewed following labs and imaging studies  CBC:  Recent Labs Lab 08/04/15 0332 08/05/15 0339 08/07/15 0250 08/08/15 0330 08/09/15 0338  WBC 10.0 10.1 11.3* 17.9* 15.0*  HGB 8.5* 8.7* 9.6* 8.6* 8.7*  HCT 26.9* 27.5* 29.9* 27.4* 27.9*  MCV 89.4 89.3 88.7 90.7 90.6  PLT 293 315 377 353 [id number]*   Basic Metabolic Panel:  Recent Labs Lab 08/05/15 0339 08/06/15 0300 08/07/15 0250 08/08/15 0330 08/09/15 0338  NA 137 138 136 133* 133*  K 2.8* 3.2* 3.4* 4.2 3.9  CL 103 103 99* 98* 96*  CO2 22 23 24 22 22   GLUCOSE 107* 100* 115* 104* 117*  BUN 35* 37* 39* 43* 62*  CREATININE 5.08* 4.85* 4.47* 4.67* 5.65*  CALCIUM 7.3* 7.6* 8.0* 8.1* 7.8*  PHOS  --   --   --   --  5.2*   Coagulation Profile:  Recent Labs Lab 08/07/15 2003  INR 1.31   Cardiac Enzymes:  Recent Labs Lab 08/07/15 0826 08/07/15 1403 08/07/15 2003  TROPONINI 0.06* 0.06* 0.06*   Urine analysis:    Component Value Date/Time   COLORURINE YELLOW 08/08/2015 0550   APPEARANCEUR CLOUDY* 08/08/2015 0550   LABSPEC 1.015 08/08/2015 0550   PHURINE 5.5 08/08/2015 0550   GLUCOSEU NEGATIVE 08/08/2015 0550   HGBUR TRACE* 08/08/2015 0550   BILIRUBINUR NEGATIVE 08/08/2015 0550   KETONESUR NEGATIVE 08/08/2015 0550   PROTEINUR 30* 08/08/2015 0550   UROBILINOGEN 0.2 12/21/2012 1300   NITRITE NEGATIVE 08/08/2015 0550   LEUKOCYTESUR NEGATIVE 08/08/2015 0550   Recent Results (from the past 240 hour(s))  Culture, blood (routine x 2) Call MD if unable to obtain prior to antibiotics being given     Status: None   Collection Time: 08/01/15  2:52 PM  Result Value Ref Range Status   Specimen Description BLOOD RIGHT HAND  Final   Special Requests IN PEDIATRIC BOTTLE [place]  Final   Culture NO GROWTH 5 DAYS  Final   Report  Status 08/06/2015 FINAL  Final  Culture, blood (routine x 2) Call MD if unable to obtain prior to antibiotics being given     Status: None   Collection Time: 08/01/15  2:57 PM  Result Value Ref Range Status   Specimen Description BLOOD RIGHT ANTECUBITAL  Final   Special Requests IN PEDIATRIC BOTTLE 1CC  Final   Culture NO GROWTH 5 DAYS  Final   Report Status 08/06/2015 FINAL  Final  Culture, sputum-assessment     Status: None   Collection Time: 08/02/15  5:27 AM  Result Value Ref Range Status   Specimen Description EXPECTORATED SPUTUM  Final   Special Requests NONE  Final   Sputum evaluation   Final  THIS SPECIMEN IS ACCEPTABLE. RESPIRATORY CULTURE REPORT TO FOLLOW.   Report Status 08/02/2015 FINAL  Final  Culture, respiratory (NON-Expectorated)     Status: None   Collection Time: 08/02/15  7:28 AM  Result Value Ref Range Status   Specimen Description SPUTUM  Final   Special Requests NONE  Final   Gram Stain   Final    FEW WBC FEW SQUAMOUS EPITHELIAL CELLS PRESENT FEW GRAM POSITIVE COCCI IN PAIRS Performed at [facility]    Culture   Final    NORMAL OROPHARYNGEAL FLORA Performed at [facility]    Report Status 08/05/2015 FINAL  Final  Culture, Urine     Status: Abnormal   Collection Time: 08/08/15  5:50 AM  Result Value Ref Range Status   Specimen Description URINE, RANDOM  Final   Special Requests cefpodoxime, azithromycin  Final   Culture MULTIPLE SPECIES PRESENT, SUGGEST RECOLLECTION (A)  Final   Report Status 08/09/2015 FINAL  Final      Radiology Studies: Ct Chest Wo Contrast 08/08/2015  Markedly abnormal pulmonary parenchyma which geographic areas of septal thickening and ground-glass opacity. This pattern was previously felt to be highly suggestive of alveolar proteinosis. However other diagnoses can also cause this pattern including atypical pneumonia, bronchoalveolar carcinoma, sarcoid, nonspecific interstitial pneumonia, cryptogenic organizing  pneumonia, ARDS and pulmonary hemorrhage. Small amount of left pleural fluid with dependent atelectasis. Small to moderate pericardial effusion. Atherosclerosis of the aorta and the coronary arteries. 1 cm right paratracheal lymph node just beneath the thoracic inlet. There is not an extensive pattern of lymphadenopathy.  Nm Pulmonary Perf And Vent 08/07/2015 Low probability pulmonary embolus.   Scheduled Meds: . azithromycin  500 mg Oral q1800  . cefpodoxime  200 mg Oral Daily  . citalopram  20 mg Oral Daily  . darbepoetin (ARANESP) injection - NON-DIALYSIS  60 mcg Subcutaneous Q Wed-1800  . furosemide  40 mg Oral Daily  . heparin  5,000 Units Subcutaneous Q8H  . levothyroxine  50 mcg Oral QAC breakfast  . linaclotide  145 mcg Oral Daily  . NIFEdipine  60 mg Oral Daily  . pantoprazole  40 mg Oral Daily  . pravastatin  80 mg Oral QHS  . saccharomyces boulardii  250 mg Oral BID  . sodium chloride flush  3 mL Intravenous Q12H  . sodium chloride flush  3 mL Intravenous Q12H  . sodium chloride flush  3 mL Intravenous Q12H  . sodium chloride flush  3 mL Intravenous Q12H   Continuous Infusions:     LOS: 8 days    Time spent: 20 minutes    Faye Ramsay, MD [facility] Pager [phone]  If 7PM-7AM, please contact night-coverage [email or web address] Password [facility] 08/10/2015, 6:49 AM

## 2015-08-11 ENCOUNTER — Inpatient Hospital Stay (HOSPITAL_COMMUNITY): Payer: Medicare HMO

## 2015-08-11 DIAGNOSIS — R918 Other nonspecific abnormal finding of lung field: Secondary | ICD-10-CM | POA: Insufficient documentation

## 2015-08-11 LAB — BASIC METABOLIC PANEL
ANION GAP: 17 — AB (ref 5–15)
BUN: 78 mg/dL — AB (ref 6–20)
CHLORIDE: 98 mmol/L — AB (ref 101–111)
CO2: 21 mmol/L — AB (ref 22–32)
Calcium: 8.1 mg/dL — ABNORMAL LOW (ref 8.9–10.3)
Creatinine, Ser: 6.47 mg/dL — ABNORMAL HIGH (ref 0.44–1.00)
GFR calc Af Amer: 6 mL/min — ABNORMAL LOW (ref >60)
GFR calc non Af Amer: 5 mL/min — ABNORMAL LOW (ref >60)
GLUCOSE: 135 mg/dL — AB (ref 65–99)
POTASSIUM: 5 mmol/L (ref 3.5–5.1)
Sodium: 136 mmol/L (ref 135–145)

## 2015-08-11 LAB — CBC
HEMATOCRIT: 27.3 % — AB (ref 36.0–46.0)
Hemoglobin: 8.3 g/dL — ABNORMAL LOW (ref 12.0–15.0)
MCH: 28.1 pg (ref 26.0–34.0)
MCHC: 30.4 g/dL (ref 30.0–36.0)
MCV: 92.5 fL (ref 78.0–100.0)
Platelets: 459 10*3/uL — ABNORMAL HIGH (ref 150–400)
RBC: 2.95 MIL/uL — AB (ref 3.87–5.11)
RDW: 14.8 % (ref 11.5–15.5)
WBC: 10.2 10*3/uL (ref 4.0–10.5)

## 2015-08-11 LAB — GLUCOSE, CAPILLARY
GLUCOSE-CAPILLARY: 185 mg/dL — AB (ref 65–99)
GLUCOSE-CAPILLARY: 121 mg/dL — AB (ref 65–99)
Glucose-Capillary: 131 mg/dL — ABNORMAL HIGH (ref 65–99)
Glucose-Capillary: 162 mg/dL — ABNORMAL HIGH (ref 65–99)

## 2015-08-11 LAB — C4 COMPLEMENT: Complement C4, Body Fluid: 30 mg/dL (ref 14–44)

## 2015-08-11 LAB — C3 COMPLEMENT: C3 COMPLEMENT: 145 mg/dL (ref 82–167)

## 2015-08-11 MED ORDER — SODIUM POLYSTYRENE SULFONATE 15 GM/60ML PO SUSP
30.0000 g | ORAL | Status: AC
  Administered 2015-08-11: 30 g via ORAL
  Filled 2015-08-11: qty 120

## 2015-08-11 NOTE — Progress Notes (Signed)
Patient ID: [name], female   DOB: 22-Dec-1934, 81 y.o.   MRN: [id number]    PROGRESS NOTE    [name]  [id number] DOB: [birth year] DOA: 08/01/2015  PCP: Sherrie Mustache, MD   Brief Narrative:  81 year old Caucasian female with known coronary artery disease, hypertension, hyperlipidemia, hypothyroidism, anemia, presented with shortness of breath. Patient was initially started on IV antibiotics but rather slow to improve. Her creatinine worsened. Chest x-ray showed persistent infiltrates. She was given high dose Lasix. Nephrology was consulted. Patient however did not improve as anticipated. Cardiology was subsequently consulted.  Assessment & Plan:  AKI on chronic kidney disease stage V - baseline creatinine 3.6-3.9 - unclear etiology of worsening renal function  - no response to lasix - appreciate nephrology team assistance  - BMP in AM  Acute on chronic systolic CHF - reduced EF of 45-50%, RHC unremarkable  - no signs of volume overload on exam  - appreciate cardiology team following, they signed off as no further cardiac eval or intervention needed   Acute respiratory distress with hypoxia  - appears to be pulmonary in etiology, viral panel only positive for rhinovirus  - pt still with mild discomfort even at rest due to dyspnea, unable to take off oxygen nasal canula  - CT chest with diffuse ground glass opacities bilaterally with broad differential including sarcoidosis, PNA, malignancy  - PCCM consulted for assistance, placed on empiric solumedrol - current vasculitis work up negative   Hypokalemia - now on upper side of normal - monitor closely  - BMP in AM  Hypothyroidism - continue synthroid   HLD - continue statin   Thrombocytosis - reactive - CBC in AM  Anemia of iron deficiency  - iron saturation 5% with ferritin 52, status post 2 units PRBC. Status post intravenous iron - aranesp started per nephrology   DVT prophylaxis: Heparin  SQ  Code Status: Full   Family Communication: Patient and daughter at bedside   Disposition Plan: home likely early next week   Consultants:   Cardiology - signed off on 5/25   PCCM consulted 5/25   Nephrology   Procedures:   Right heart cath 5/24 --> see below   Findings: RA = 10 RV = 37/10 PA = 41/10 (22) PCW = 8 Fick cardiac output/index = 4.8/2.8 PVR = 2.9 WU FA sat = 89% on 3L PA sat = 47%, 51%  Assessment: 1. Mild PAH (likely WHO Group 3) 2. Normal PCWP and cardiac output 3. Resting hypoxemia 4. Suspect dyspnea and mild PAH due to intrinsic lung disease. Left side pressures are normal. PVR normal.  5. No role for selective pulmonary vasodilators.   Antimicrobials:   Zithromax 5/20 -->  Vantin 5/20 -->   Subjective: Still with dyspnea at rest and with exertion.  Objective: Filed Vitals:   08/10/15 1354 08/10/15 1355 08/10/15 2209 08/11/15 0512  BP: 96/54 104/54 113/63 114/68  Pulse: 87  84 72  Temp: 98.1 F (36.7 C)  97.8 F (36.6 C) 97.7 F (36.5 C)  TempSrc: Oral  Oral Oral  Resp: 20  17 18   Weight:    60.1 kg (132 lb 7.9 oz)  SpO2: 95%  99% 98%    Intake/Output Summary (Last 24 hours) at 08/11/15 0637 Last data filed at 08/10/15 1900  Gross per 24 hour  Intake    240 ml  Output    350 ml  Net   -110 ml   Filed Weights   08/08/15 0500  08/10/15 0500 08/11/15 0512  Weight: 61 kg (134 lb 7.7 oz) 60 kg (132 lb 4.4 oz) 60.1 kg (132 lb 7.9 oz)    Examination:  General exam: Appears calm and comfortable  Respiratory system: rhonchi at bases and diminished breath sounds at bases  Cardiovascular system: S1 & S2 heard, RRR. No JVD, murmurs, rubs, gallops or clicks. No pedal edema. Gastrointestinal system: Abdomen is nondistended, soft and nontender.  Central nervous system: Alert and oriented. No focal neurological deficits. Psychiatry: Judgement and insight appear normal. Mood & affect appropriate.   Data Reviewed: I have personally  reviewed following labs and imaging studies  CBC:  Recent Labs Lab 08/07/15 0250 08/08/15 0330 08/09/15 0338 08/10/15 0634 08/11/15 0456  WBC 11.3* 17.9* 15.0* 11.5* 10.2  NEUTROABS  --   --   --  9.6*  --   HGB 9.6* 8.6* 8.7* 8.1* 8.3*  HCT 29.9* 27.4* 27.9* 26.1* 27.3*  MCV 88.7 90.7 90.6 92.2 92.5  PLT 377 353 410* 397 [id number]*   Basic Metabolic Panel:  Recent Labs Lab 08/07/15 0250 08/08/15 0330 08/09/15 0338 08/10/15 0634 08/11/15 0456  NA 136 133* 133* 136 136  K 3.4* 4.2 3.9 4.1 5.0  CL 99* 98* 96* 99* 98*  CO2 24 22 22 24  21*  GLUCOSE 115* 104* 117* 97 135*  BUN 39* 43* 62* 65* 78*  CREATININE 4.47* 4.67* 5.65* 6.04* 6.47*  CALCIUM 8.0* 8.1* 7.8* 7.8* 8.1*  PHOS  --   --  5.2* 5.9*  --    Coagulation Profile:  Recent Labs Lab 08/07/15 2003  INR 1.31   Cardiac Enzymes:  Recent Labs Lab 08/07/15 0826 08/07/15 1403 08/07/15 2003  TROPONINI 0.06* 0.06* 0.06*   Urine analysis:    Component Value Date/Time   COLORURINE YELLOW 08/08/2015 0550   APPEARANCEUR CLOUDY* 08/08/2015 0550   LABSPEC 1.015 08/08/2015 0550   PHURINE 5.5 08/08/2015 0550   GLUCOSEU NEGATIVE 08/08/2015 0550   HGBUR TRACE* 08/08/2015 0550   BILIRUBINUR NEGATIVE 08/08/2015 0550   KETONESUR NEGATIVE 08/08/2015 0550   PROTEINUR 30* 08/08/2015 0550   UROBILINOGEN 0.2 12/21/2012 1300   NITRITE NEGATIVE 08/08/2015 0550   LEUKOCYTESUR NEGATIVE 08/08/2015 0550   Recent Results (from the past 240 hour(s))  Culture, blood (routine x 2) Call MD if unable to obtain prior to antibiotics being given     Status: None   Collection Time: 08/01/15  2:52 PM  Result Value Ref Range Status   Specimen Description BLOOD RIGHT HAND  Final   Special Requests IN PEDIATRIC BOTTLE 1CC  Final   Culture NO GROWTH 5 DAYS  Final   Report Status 08/06/2015 FINAL  Final  Culture, blood (routine x 2) Call MD if unable to obtain prior to antibiotics being given     Status: None   Collection Time: 08/01/15   2:57 PM  Result Value Ref Range Status   Specimen Description BLOOD RIGHT ANTECUBITAL  Final   Special Requests IN PEDIATRIC BOTTLE 1CC  Final   Culture NO GROWTH 5 DAYS  Final   Report Status 08/06/2015 FINAL  Final  Culture, sputum-assessment     Status: None   Collection Time: 08/02/15  5:27 AM  Result Value Ref Range Status   Specimen Description EXPECTORATED SPUTUM  Final   Special Requests NONE  Final   Sputum evaluation   Final    THIS SPECIMEN IS ACCEPTABLE. RESPIRATORY CULTURE REPORT TO FOLLOW.   Report Status 08/02/2015 FINAL  Final  Culture, respiratory (  NON-Expectorated)     Status: None   Collection Time: 08/02/15  7:28 AM  Result Value Ref Range Status   Specimen Description SPUTUM  Final   Special Requests NONE  Final   Gram Stain   Final    FEW WBC FEW SQUAMOUS EPITHELIAL CELLS PRESENT FEW GRAM POSITIVE COCCI IN PAIRS Performed at [facility]    Culture   Final    NORMAL OROPHARYNGEAL FLORA Performed at [facility]    Report Status 08/05/2015 FINAL  Final  Culture, Urine     Status: Abnormal   Collection Time: 08/08/15  5:50 AM  Result Value Ref Range Status   Specimen Description URINE, RANDOM  Final   Special Requests cefpodoxime, azithromycin  Final   Culture MULTIPLE SPECIES PRESENT, SUGGEST RECOLLECTION (A)  Final   Report Status 08/09/2015 FINAL  Final  Respiratory Panel by PCR     Status: Abnormal   Collection Time: 08/09/15 11:50 PM  Result Value Ref Range Status   Adenovirus NOT DETECTED NOT DETECTED Final   Coronavirus 229E NOT DETECTED NOT DETECTED Final   Coronavirus HKU1 NOT DETECTED NOT DETECTED Final   Coronavirus NL63 NOT DETECTED NOT DETECTED Final   Coronavirus OC43 NOT DETECTED NOT DETECTED Final   Metapneumovirus NOT DETECTED NOT DETECTED Final   Rhinovirus / Enterovirus DETECTED (A) NOT DETECTED Final   Influenza A NOT DETECTED NOT DETECTED Final   Influenza A H1 NOT DETECTED NOT DETECTED Final   Influenza A H1  2009 NOT DETECTED NOT DETECTED Final   Influenza A H3 NOT DETECTED NOT DETECTED Final   Influenza B NOT DETECTED NOT DETECTED Final   Parainfluenza Virus 1 NOT DETECTED NOT DETECTED Final   Parainfluenza Virus 2 NOT DETECTED NOT DETECTED Final   Parainfluenza Virus 3 NOT DETECTED NOT DETECTED Final   Parainfluenza Virus 4 NOT DETECTED NOT DETECTED Final   Respiratory Syncytial Virus NOT DETECTED NOT DETECTED Final   Bordetella pertussis NOT DETECTED NOT DETECTED Final   Chlamydophila pneumoniae NOT DETECTED NOT DETECTED Final   Mycoplasma pneumoniae NOT DETECTED NOT DETECTED Final      Radiology Studies: Ct Chest Wo Contrast 08/08/2015  Markedly abnormal pulmonary parenchyma which geographic areas of septal thickening and ground-glass opacity. This pattern was previously felt to be highly suggestive of alveolar proteinosis. However other diagnoses can also cause this pattern including atypical pneumonia, bronchoalveolar carcinoma, sarcoid, nonspecific interstitial pneumonia, cryptogenic organizing pneumonia, ARDS and pulmonary hemorrhage. Small amount of left pleural fluid with dependent atelectasis. Small to moderate pericardial effusion. Atherosclerosis of the aorta and the coronary arteries. 1 cm right paratracheal lymph node just beneath the thoracic inlet. There is not an extensive pattern of lymphadenopathy.  Nm Pulmonary Perf And Vent 08/07/2015 Low probability pulmonary embolus.   Scheduled Meds: . azithromycin  500 mg Oral q1800  . cefpodoxime  200 mg Oral Daily  . citalopram  20 mg Oral Daily  . darbepoetin (ARANESP) injection - NON-DIALYSIS  60 mcg Subcutaneous Q Wed-1800  . furosemide  40 mg Oral Daily  . heparin  5,000 Units Subcutaneous Q8H  . insulin aspart  0-9 Units Subcutaneous TID WC  . levothyroxine  50 mcg Oral QAC breakfast  . linaclotide  145 mcg Oral Daily  . methylPREDNISolone (SOLU-MEDROL) injection  40 mg Intravenous Q12H  . NIFEdipine  60 mg Oral Daily  .  pantoprazole  40 mg Oral Daily  . pravastatin  80 mg Oral QHS  . saccharomyces boulardii  250 mg  Oral BID  . sodium chloride flush  3 mL Intravenous Q12H  . sodium chloride flush  3 mL Intravenous Q12H  . sodium chloride flush  3 mL Intravenous Q12H  . sodium chloride flush  3 mL Intravenous Q12H   Continuous Infusions:     LOS: 9 days    Time spent: 20 minutes    Faye Ramsay, MD [facility] Pager [phone]  If 7PM-7AM, please contact night-coverage [email or web address] Password [facility] 08/11/2015, 6:37 AM

## 2015-08-11 NOTE — Progress Notes (Signed)
Patient ID: [name], female   DOB: [birth year], 81 y.o.   MRN: [id number]  [facility] Progress Note   Assessment/ Plan:   1. AKI on chronic kidney disease stage V (baseline creatinine 3.6-3.9). Suspected to be hemodynamically mediated with possible intravascular contraction-renal function continues to worsen with incompletely charted urine output. Discontinue furosemide given current physical exam findings and absence of any pedal edema. There are no compelling indications for hemodialysis as yet-she does not have any signs or symptoms for critical electrolyte abnormality/volume excess. We'll continue to follow this with cautious monitoring for possible need of dialysis-left BCF in place. 2. Acute exacerbation of diastolic heart failure: Appears to be compensated per [facility] report- discontinue furosemide 3. Hypoxia: CT scan reviewed and with concerning parenchymal pattern-- ongoing evaluation by pulmonary, no evidence of ANCA vasculitis or scleroderma, complement levels within acceptable limits and elevated rheumatoid factor. 4. Anemia: With iron deficiency (iron saturation 5% with ferritin 52), status post 2 units PRBC. Status post intravenous iron and now on Aranesp 5. Atypical pneumonia: On antimicrobial coverage with azithromycin/cefpodoxime-respiratory cultures to direct further management. Appears that it may be possible to discontinue respiratory precautions with essentially negative panel.  Subjective:   She reports that she is doing fair and without any nausea, vomiting, dysgeusia, chest pain or tremors. Oriented 3. Feels better after started on corticosteroids    Objective:   BP 114/68 mmHg  Pulse 72  Temp(Src) 97.7 F (36.5 C) (Oral)  Resp 18  Wt 60.1 kg (132 lb 7.9 oz)  SpO2 98%  Intake/Output Summary (Last 24 hours) at 08/11/15 0911 Last data filed at 08/10/15 1900  Gross per 24 hour  Intake    120 ml  Output    350 ml  Net   -230 ml   Weight change: 0.1 kg  (3.5 oz)  Physical Exam: [id number] comfortable resting in bed-oriented and without asterixis. CVS: Pulse regular rhythm, normal rate Resp: Coarse breath sounds bilaterally with intermittent expiratory wheeze/rhonchi Abd: Soft, flat, nontender Ext: No ankle edema. Left brachiocephalic fistula with pulsatile thrill.  Imaging: Dg Chest 2 View  08/10/2015  ADDENDUM REPORT: 08/10/2015 16:36 ADDENDUM: The patient does not have a nasogastric tube. The structure felt to be a nasogastric tube is indeed an EKG lead. The patient's nurse is aware of these findings. Electronically Signed   By: Kathreen Devoid   On: 08/10/2015 16:36  08/10/2015  CLINICAL DATA:  Bilateral lung infiltrates, cough EXAM: CHEST  2 VIEW COMPARISON:  CT chest 08/08/2015 FINDINGS: There are bilateral patchy areas of airspace disease. There is a trace left pleural effusion. There is no pneumothorax. There is stable cardiomegaly. There is a nasogastric tube with the tip projecting over the stomach. The osseous structures are unremarkable. IMPRESSION: Bilateral patchy areas of airspace disease. Electronically Signed: By: Kathreen Devoid On: 08/10/2015 15:35   Dg Chest Port 1 View  08/11/2015  CLINICAL DATA:  81 year old female with history of pneumonitis. EXAM: PORTABLE CHEST 1 VIEW COMPARISON:  Chest x-ray 08/10/2015. FINDINGS: Lung volumes are low. Elevation of the right hemidiaphragm is unchanged. Patchy multifocal interstitial and airspace disease is again noted, asymmetrically distributed throughout the lungs bilaterally. Overall, aeration appears similar to slightly worsened compared to the prior examination, particularly in the right upper lobe, however, this may in part reflect lower lung volumes. No definite pleural effusions. Findings are not favored to reflect pulmonary edema, and pulmonary vasculature does not appear clearly engorged. Heart is mildly enlarged. Upper mediastinal contours are distorted  by patient positioning.  IMPRESSION: 1. Slight progression of asymmetrically distributed multilobar interstitial and airspace opacities throughout the lungs bilaterally, concerning for progressively worsening multilobar pneumonia. 2. Low lung volumes with chronic elevation of the right hemidiaphragm. Electronically Signed   By: Vinnie Langton M.D.   On: 08/11/2015 07:46    Labs: BMET  Recent Labs Lab 08/05/15 0339 08/06/15 0300 08/07/15 0250 08/08/15 0330 08/09/15 0338 08/10/15 0634 08/11/15 0456  NA 137 138 136 133* 133* 136 136  K 2.8* 3.2* 3.4* 4.2 3.9 4.1 5.0  CL 103 103 99* 98* 96* 99* 98*  CO2 22 23 24 22 22 24  21*  GLUCOSE 107* 100* 115* 104* 117* 97 135*  BUN 35* 37* 39* 43* 62* 65* 78*  CREATININE 5.08* 4.85* 4.47* 4.67* 5.65* 6.04* 6.47*  CALCIUM 7.3* 7.6* 8.0* 8.1* 7.8* 7.8* 8.1*  PHOS  --   --   --   --  5.2* 5.9*  --    CBC  Recent Labs Lab 08/08/15 0330 08/09/15 0338 08/10/15 0634 08/11/15 0456  WBC 17.9* 15.0* 11.5* 10.2  NEUTROABS  --   --  9.6*  --   HGB 8.6* 8.7* 8.1* 8.3*  HCT 27.4* 27.9* 26.1* 27.3*  MCV 90.7 90.6 92.2 92.5  PLT 353 410* 397 459*   Medications:    . azithromycin  500 mg Oral q1800  . cefpodoxime  200 mg Oral Daily  . citalopram  20 mg Oral Daily  . darbepoetin (ARANESP) injection - NON-DIALYSIS  60 mcg Subcutaneous Q Wed-1800  . furosemide  40 mg Oral Daily  . heparin  5,000 Units Subcutaneous Q8H  . insulin aspart  0-9 Units Subcutaneous TID WC  . levothyroxine  50 mcg Oral QAC breakfast  . linaclotide  145 mcg Oral Daily  . methylPREDNISolone (SOLU-MEDROL) injection  40 mg Intravenous Q12H  . NIFEdipine  60 mg Oral Daily  . pantoprazole  40 mg Oral Daily  . pravastatin  80 mg Oral QHS  . saccharomyces boulardii  250 mg Oral BID  . sodium chloride flush  3 mL Intravenous Q12H  . sodium chloride flush  3 mL Intravenous Q12H  . sodium chloride flush  3 mL Intravenous Q12H  . sodium chloride flush  3 mL Intravenous Q12H   Elmarie Shiley,  MD 08/11/2015, 9:11 AM

## 2015-08-11 NOTE — Progress Notes (Signed)
Name: [name] MRN: [id number] DOB: 09/13/1934    ADMISSION DATE:  08/01/2015 CONSULTATION DATE:  08/09/2015  REFERRING MD :  Doyle Askew  CHIEF COMPLAINT:  SOB  BRIEF PATIENT DESCRIPTION: 81 year old female admitted with SOB which has not responded to effective diuresis and IV ABX. Diffuse bilateral infiltrates on CT.  SUBJECTIVE: Sitting in chair/ on 02 4llpm , feels better and wants to go home   VITAL SIGNS: Temp:  [97.5 F (36.4 C)-97.8 F (36.6 C)] 97.5 F (36.4 C) (05/27 1338) Pulse Rate:  [72-84] 81 (05/27 1338) Resp:  [16-18] 16 (05/27 1338) BP: (108-114)/(59-68) 108/59 mmHg (05/27 1338) SpO2:  [98 %-100 %] 100 % (05/27 1338) Weight:  [132 lb 7.9 oz (60.1 kg)] 132 lb 7.9 oz (60.1 kg) (05/27 0512)  PHYSICAL EXAMINATION: General:  Elderly female in NAD lying in bed Neuro:  Alert, oriented, non-focal HEENT:  [place]/AT, PERRL, no JVD Cardiovascular:  RRR, no MRG, no JVD Lungs: even/non-labored on 3L, lungs bilaterally with rhonchi, mild crackles lower lobes Abdomen:  Soft, non-tender, non-distended Musculoskeletal:  No acute deformity or ROM limitation Skin:  Grossly intact   Recent Labs Lab 08/09/15 0338 08/10/15 0634 08/11/15 0456  NA 133* 136 136  K 3.9 4.1 5.0  CL 96* 99* 98*  CO2 22 24 21*  BUN 62* 65* 78*  CREATININE 5.65* 6.04* 6.47*  GLUCOSE 117* 97 135*    Recent Labs Lab 08/09/15 0338 08/10/15 0634 08/11/15 0456  HGB 8.7* 8.1* 8.3*  HCT 27.9* 26.1* 27.3*  WBC 15.0* 11.5* 10.2  PLT 410* 397 459*   Dg Chest 2 View  08/10/2015  ADDENDUM REPORT: 08/10/2015 16:36 ADDENDUM: The patient does not have a nasogastric tube. The structure felt to be a nasogastric tube is indeed an EKG lead. The patient's nurse is aware of these findings. Electronically Signed   By: Kathreen Devoid   On: 08/10/2015 16:36  08/10/2015  CLINICAL DATA:  Bilateral lung infiltrates, cough EXAM: CHEST  2 VIEW COMPARISON:  CT chest Sep 01, 2015 FINDINGS: There are bilateral patchy areas  of airspace disease. There is a trace left pleural effusion. There is no pneumothorax. There is stable cardiomegaly. There is a nasogastric tube with the tip projecting over the stomach. The osseous structures are unremarkable. IMPRESSION: Bilateral patchy areas of airspace disease. Electronically Signed: By: Kathreen Devoid On: 08/10/2015 15:35   Dg Chest Port 1 View  08/11/2015  CLINICAL DATA:  81 year old female with history of pneumonitis. EXAM: PORTABLE CHEST 1 VIEW COMPARISON:  Chest x-ray 08/10/2015. FINDINGS: Lung volumes are low. Elevation of the right hemidiaphragm is unchanged. Patchy multifocal interstitial and airspace disease is again noted, asymmetrically distributed throughout the lungs bilaterally. Overall, aeration appears similar to slightly worsened compared to the prior examination, particularly in the right upper lobe, however, this may in part reflect lower lung volumes. No definite pleural effusions. Findings are not favored to reflect pulmonary edema, and pulmonary vasculature does not appear clearly engorged. Heart is mildly enlarged. Upper mediastinal contours are distorted by patient positioning. IMPRESSION: 1. Slight progression of asymmetrically distributed multilobar interstitial and airspace opacities throughout the lungs bilaterally, concerning for progressively worsening multilobar pneumonia. 2. Low lung volumes with chronic elevation of the right hemidiaphragm. Electronically Signed   By: Vinnie Langton M.D.   On: 08/11/2015 07:46   SIGNIFICANT EVENTS  5/17 admit Sep 01, 2022 R[facility] 5/25 Pulm consult  STUDIES:  5/23 V/Q > low prob PE 09/01/2022 [facility] > mild PAH (likely group 3), with PRV  2.9 normal PCWP and CO, resting hypoxemia.  No role for pulmonary vasodilators.  5/24 CT Chest > Markedly abnormal pulmonary parenchyma which geographic areas of septal thickening and ground-glass opacity. Small amount of left pleural fluid with dependent atelectasis. Small to moderate pericardial  effusion. Atherosclerosis of the aorta and the coronary arteries. 1 cm right paratracheal lymph node just beneath the thoracic inlet. There is not an extensive pattern of lymphadenopathy.  AUTOIMMUNE:  5/25  ESR >> 122 CRP >> 34.1  RF >> 21.3  ANA >>  ANCA >> ACE >> 22 Eosinophils >> 0.03 (nml)  CULTURES:  BCx2 5/17 >>  Sputum 5/18 >> normal flora  RVP 5/25 >>   ASSESSMENT / PLAN:  Dyspnea secondary to diffuse pulmonary infiltrates - etiology uncertain at this time as differential is broad and includes autoimmune disease ( RA), pulmonary fibrosis, BOOP, pulmonary edema, and viral pneumonitis. Particularly considering that she has failed ABX and diuresis.  Acute Hypoxemic Respiratory Failure   Plan: - Supplemental O2 as needed to maintain SpO2 > 92% - Follow autoimmune panel ESR, CRP, RF, ANA, ANCA,  - Follow respiratory viral panel - May require lung biopsy - PPI daily as ordered, consider swallow evaluation (pt denies symptoms) - Assess ambulatory O2 needs prior to discharge  - Intermittent CXR   Acute diastolic CHF HTN CAD Mild PH Pulmonary Edema   Plan: - Per cards  CKD stage IV  Plan: - Per nephrology     Christinia Gully, MD Pulmonary and [place] [phone] After 5:30 PM or weekends, call [phone]

## 2015-08-12 LAB — CBC
HEMATOCRIT: 27.8 % — AB (ref 36.0–46.0)
Hemoglobin: 8.6 g/dL — ABNORMAL LOW (ref 12.0–15.0)
MCH: 29 pg (ref 26.0–34.0)
MCHC: 30.9 g/dL (ref 30.0–36.0)
MCV: 93.6 fL (ref 78.0–100.0)
PLATELETS: 452 10*3/uL — AB (ref 150–400)
RBC: 2.97 MIL/uL — AB (ref 3.87–5.11)
RDW: 14.7 % (ref 11.5–15.5)
WBC: 12.8 10*3/uL — ABNORMAL HIGH (ref 4.0–10.5)

## 2015-08-12 LAB — GLUCOSE, CAPILLARY
GLUCOSE-CAPILLARY: 163 mg/dL — AB (ref 65–99)
GLUCOSE-CAPILLARY: 150 mg/dL — AB (ref 65–99)
Glucose-Capillary: 112 mg/dL — ABNORMAL HIGH (ref 65–99)
Glucose-Capillary: 98 mg/dL (ref 65–99)

## 2015-08-12 LAB — RENAL FUNCTION PANEL
Albumin: 2.5 g/dL — ABNORMAL LOW (ref 3.5–5.0)
Anion gap: 17 — ABNORMAL HIGH (ref 5–15)
BUN: 89 mg/dL — ABNORMAL HIGH (ref 6–20)
CHLORIDE: 97 mmol/L — AB (ref 101–111)
CO2: 21 mmol/L — AB (ref 22–32)
CREATININE: 6.38 mg/dL — AB (ref 0.44–1.00)
Calcium: 7.7 mg/dL — ABNORMAL LOW (ref 8.9–10.3)
GFR calc non Af Amer: 6 mL/min — ABNORMAL LOW (ref >60)
GFR, EST AFRICAN AMERICAN: 6 mL/min — AB (ref >60)
Glucose, Bld: 107 mg/dL — ABNORMAL HIGH (ref 65–99)
POTASSIUM: 4.2 mmol/L (ref 3.5–5.1)
Phosphorus: 6.5 mg/dL — ABNORMAL HIGH (ref 2.5–4.6)
Sodium: 135 mmol/L (ref 135–145)

## 2015-08-12 LAB — COMPLEMENT, TOTAL: Compl, Total (CH50): >60 U/mL — ABNORMAL HIGH (ref 42–60)

## 2015-08-12 NOTE — Progress Notes (Signed)
Patient ID: [name], female   DOB: 1934/05/13, 81 y.o.   MRN: [id number]  [facility] Progress Note   Assessment/ Plan:   1. AKI on chronic kidney disease stage V (baseline creatinine 3.6-3.9). Suspected to be hemodynamically mediated with diastolic heart failure and then possible intravascular contraction after aggressive diuresis- with incompletely charted urine output. Creatinine unchanged over the past 24 hours but BUN higher likely from ongoing corticosteroid use-she does not have any acute dialysis needs at this time. We'll continue to follow this with cautious monitoring for possible need of dialysis-left BCF in place. 2. Acute exacerbation of diastolic heart failure: Appears to be compensated per RHC report- discontinue furosemide 3. Hypoxia: CT scan reviewed and with concerning parenchymal pattern-- ongoing evaluation by pulmonary, no evidence of ANCA vasculitis or scleroderma, complement levels within acceptable limits and elevated rheumatoid factor. 4. Anemia: With iron deficiency (iron saturation 5% with ferritin 52), status post 2 units PRBC. Status post intravenous iron and now on Aranesp 5. Atypical pneumonia: On antimicrobial coverage with azithromycin/cefpodoxime-respiratory cultures to direct further management. Testing so far has been negative except for rhinovirus  Subjective:   Reports an uneventful night and without any emerging nausea, vomiting, dysgeusia, tremors or worsening shortness of breath.    Objective:   BP 151/74 mmHg  Pulse 74  Temp(Src) 97.8 F (36.6 C) (Oral)  Resp 20  Wt 60.5 kg (133 lb 6.1 oz)  SpO2 93%  Intake/Output Summary (Last 24 hours) at 08/12/15 0938 Last data filed at 08/12/15 0521  Gross per 24 hour  Intake    120 ml  Output    600 ml  Net   -480 ml   Weight change: 0.4 kg (14.1 oz)  Physical Exam: [id number] comfortable Sitting up on the side of her bed eating breakfast. CVS: Pulse regular rhythm, normal  rate Resp: Coarse breath sounds bilaterally with intermittent expiratory wheeze/rhonchi Abd: Soft, flat, nontender Ext: No ankle edema. Left brachiocephalic fistula with pulsatile thrill.  Imaging: Dg Chest 2 View  08/10/2015  ADDENDUM REPORT: 08/10/2015 16:36 ADDENDUM: The patient does not have a nasogastric tube. The structure felt to be a nasogastric tube is indeed an EKG lead. The patient's nurse is aware of these findings. Electronically Signed   By: Kathreen Devoid   On: 08/10/2015 16:36  08/10/2015  CLINICAL DATA:  Bilateral lung infiltrates, cough EXAM: CHEST  2 VIEW COMPARISON:  CT chest 08/08/2015 FINDINGS: There are bilateral patchy areas of airspace disease. There is a trace left pleural effusion. There is no pneumothorax. There is stable cardiomegaly. There is a nasogastric tube with the tip projecting over the stomach. The osseous structures are unremarkable. IMPRESSION: Bilateral patchy areas of airspace disease. Electronically Signed: By: Kathreen Devoid On: 08/10/2015 15:35   Dg Chest Port 1 View  08/11/2015  CLINICAL DATA:  81 year old female with history of pneumonitis. EXAM: PORTABLE CHEST 1 VIEW COMPARISON:  Chest x-ray 08/10/2015. FINDINGS: Lung volumes are low. Elevation of the right hemidiaphragm is unchanged. Patchy multifocal interstitial and airspace disease is again noted, asymmetrically distributed throughout the lungs bilaterally. Overall, aeration appears similar to slightly worsened compared to the prior examination, particularly in the right upper lobe, however, this may in part reflect lower lung volumes. No definite pleural effusions. Findings are not favored to reflect pulmonary edema, and pulmonary vasculature does not appear clearly engorged. Heart is mildly enlarged. Upper mediastinal contours are distorted by patient positioning. IMPRESSION: 1. Slight progression of asymmetrically distributed multilobar interstitial and airspace  opacities throughout the lungs bilaterally,  concerning for progressively worsening multilobar pneumonia. 2. Low lung volumes with chronic elevation of the right hemidiaphragm. Electronically Signed   By: Vinnie Langton M.D.   On: 08/11/2015 07:46    Labs: BMET  Recent Labs Lab 08/06/15 0300 08/07/15 0250 08/08/15 0330 08/09/15 0338 08/10/15 0634 08/11/15 0456 08/12/15 0212  NA 138 136 133* 133* 136 136 135  K 3.2* 3.4* 4.2 3.9 4.1 5.0 4.2  CL 103 99* 98* 96* 99* 98* 97*  CO2 23 24 22 22 24  21* 21*  GLUCOSE 100* 115* 104* 117* 97 135* 107*  BUN 37* 39* 43* 62* 65* 78* 89*  CREATININE 4.85* 4.47* 4.67* 5.65* 6.04* 6.47* 6.38*  CALCIUM 7.6* 8.0* 8.1* 7.8* 7.8* 8.1* 7.7*  PHOS  --   --   --  5.2* 5.9*  --  6.5*   CBC  Recent Labs Lab 08/09/15 0338 08/10/15 0634 08/11/15 0456 08/12/15 0212  WBC 15.0* 11.5* 10.2 12.8*  NEUTROABS  --  9.6*  --   --   HGB 8.7* 8.1* 8.3* 8.6*  HCT 27.9* 26.1* 27.3* 27.8*  MCV 90.6 92.2 92.5 93.6  PLT 410* 397 459* 452*   Medications:    . azithromycin  500 mg Oral q1800  . cefpodoxime  200 mg Oral Daily  . citalopram  20 mg Oral Daily  . darbepoetin (ARANESP) injection - NON-DIALYSIS  60 mcg Subcutaneous Q Wed-1800  . heparin  5,000 Units Subcutaneous Q8H  . insulin aspart  0-9 Units Subcutaneous TID WC  . levothyroxine  50 mcg Oral QAC breakfast  . linaclotide  145 mcg Oral Daily  . methylPREDNISolone (SOLU-MEDROL) injection  40 mg Intravenous Q12H  . NIFEdipine  60 mg Oral Daily  . pantoprazole  40 mg Oral Daily  . pravastatin  80 mg Oral QHS  . saccharomyces boulardii  250 mg Oral BID  . sodium chloride flush  3 mL Intravenous Q12H  . sodium chloride flush  3 mL Intravenous Q12H  . sodium chloride flush  3 mL Intravenous Q12H  . sodium chloride flush  3 mL Intravenous Q12H   Elmarie Shiley, MD 08/12/2015, 9:38 AM

## 2015-08-12 NOTE — Progress Notes (Signed)
Pt. Walked 347ft. On 3L of o2 x2 tolerated well

## 2015-08-12 NOTE — Progress Notes (Addendum)
Patient ID: JERYN RAYBORN, female   DOB: 30-Apr-1934, 81 y.o.   MRN: [id number]    PROGRESS NOTE    ALAINAH LIGHTLE  [id number] DOB: 28-Dec-1934 DOA: 08/01/2015  PCP: Sherrie Mustache, MD   Brief Narrative:  81 year old Caucasian female with known coronary artery disease, hypertension, hyperlipidemia, hypothyroidism, anemia, presented with shortness of breath. Patient was initially started on IV antibiotics but rather slow to improve. Her creatinine worsened. Chest x-ray showed persistent infiltrates. She was given high dose Lasix. Nephrology was consulted. Patient however did not improve as anticipated. Cardiology was subsequently consulted.  Assessment & Plan:  AKI on chronic kidney disease stage V - baseline creatinine 3.6-3.9 - unclear etiology of worsening renal function  - no response to lasix - appreciate nephrology team assistance  - no significant change in Cr in the past 24 hours but at least not worse  - BMP in AM  Acute on chronic systolic CHF - resolved  - reduced EF of 45-50%, RHC unremarkable  - no signs of volume overload on exam  - appreciate cardiology team following, they signed off as no further cardiac eval or intervention needed   Acute respiratory distress with hypoxia  - appears to be pulmonary in etiology, viral panel only positive for rhinovirus  - still oxygen dependent  - CT chest with diffuse ground glass opacities bilaterally with broad differential including sarcoidosis, PNA, malignancy  - PCCM consulted for assistance, placed on empiric solumedrol, today is day #3 of steroids  - current vasculitis work up negative  - today is day #8 of ABX, will stop after today's dose  Hypokalemia - WNL this AM  - BMP in AM  Hypothyroidism - continue synthroid   HLD - continue statin   Thrombocytosis - reactive - CBC in AM  Anemia of iron deficiency, chronic disease  - iron saturation 5% with ferritin 52, status post 2 units PRBC. Status post  intravenous iron - aranesp started per nephrology   DVT prophylaxis: Heparin SQ  Code Status: Full   Family Communication: Patient and husband at bedside   Disposition Plan: home likely early next week   Consultants:   Cardiology - signed off on 5/25   PCCM consulted 5/25   Nephrology   Procedures:   Right heart cath 5/24 --> see below   Findings: RA = 10 RV = 37/10 PA = 41/10 (22) PCW = 8 Fick cardiac output/index = 4.8/2.8 PVR = 2.9 WU FA sat = 89% on 3L PA sat = 47%, 51%  Assessment: 1. Mild PAH (likely WHO Group 3) 2. Normal PCWP and cardiac output 3. Resting hypoxemia 4. Suspect dyspnea and mild PAH due to intrinsic lung disease. Left side pressures are normal. PVR normal.  5. No role for selective pulmonary vasodilators.   Antimicrobials:   Zithromax 5/20 --> 5/28  Vantin 5/20 --> 5/28   Subjective: Still with dyspnea but now more with exertion and per pt almost resolved at rest.   Objective: Filed Vitals:   08/11/15 1336 08/11/15 1338 08/11/15 2037 08/12/15 0521  BP:  108/59 135/70 151/74  Pulse:  81 84 74  Temp:  97.5 F (36.4 C) 98.1 F (36.7 C) 97.8 F (36.6 C)  TempSrc: Oral Oral Oral Oral  Resp:  16 18 20   Weight:    60.5 kg (133 lb 6.1 oz)  SpO2:  100% 98% 93%    Intake/Output Summary (Last 24 hours) at 08/12/15 1026 Last data filed at 08/12/15 0521  Gross per  24 hour  Intake    120 ml  Output    600 ml  Net   -480 ml   Filed Weights   08/10/15 0500 08/11/15 0512 08/12/15 0521  Weight: 60 kg (132 lb 4.4 oz) 60.1 kg (132 lb 7.9 oz) 60.5 kg (133 lb 6.1 oz)    Examination:  General exam: Appears calm and comfortable  Respiratory system: rhonchi at bases and diminished breath sounds at bases, dyspnea with exertion noted as pt tried to get out of the bed to the bathroom  Cardiovascular system: S1 & S2 heard, RRR. No JVD, murmurs, rubs, gallops or clicks. No pedal edema. Gastrointestinal system: Abdomen is nondistended, soft and  nontender.  Central nervous system: Alert and oriented. No focal neurological deficits. Psychiatry: Judgement and insight appear normal. Mood & affect appropriate.   Data Reviewed: I have personally reviewed following labs and imaging studies  CBC:  Recent Labs Lab 08/08/15 0330 08/09/15 0338 08/10/15 0634 08/11/15 0456 08/12/15 0212  WBC 17.9* 15.0* 11.5* 10.2 12.8*  NEUTROABS  --   --  9.6*  --   --   HGB 8.6* 8.7* 8.1* 8.3* 8.6*  HCT 27.4* 27.9* 26.1* 27.3* 27.8*  MCV 90.7 90.6 92.2 92.5 93.6  PLT 353 410* 397 459* [id number]*   Basic Metabolic Panel:  Recent Labs Lab 08/08/15 0330 08/09/15 0338 08/10/15 0634 08/11/15 0456 08/12/15 0212  NA 133* 133* 136 136 135  K 4.2 3.9 4.1 5.0 4.2  CL 98* 96* 99* 98* 97*  CO2 22 22 24  21* 21*  GLUCOSE 104* 117* 97 135* 107*  BUN 43* 62* 65* 78* 89*  CREATININE 4.67* 5.65* 6.04* 6.47* 6.38*  CALCIUM 8.1* 7.8* 7.8* 8.1* 7.7*  PHOS  --  5.2* 5.9*  --  6.5*   Coagulation Profile:  Recent Labs Lab 08/07/15 2003  INR 1.31   Cardiac Enzymes:  Recent Labs Lab 08/07/15 0826 08/07/15 1403 08/07/15 2003  TROPONINI 0.06* 0.06* 0.06*   Urine analysis:    Component Value Date/Time   COLORURINE YELLOW 08/08/2015 0550   APPEARANCEUR CLOUDY* 08/08/2015 0550   LABSPEC 1.015 08/08/2015 0550   PHURINE 5.5 08/08/2015 0550   GLUCOSEU NEGATIVE 08/08/2015 0550   HGBUR TRACE* 08/08/2015 0550   BILIRUBINUR NEGATIVE 08/08/2015 0550   KETONESUR NEGATIVE 08/08/2015 0550   PROTEINUR 30* 08/08/2015 0550   UROBILINOGEN 0.2 12/21/2012 1300   NITRITE NEGATIVE 08/08/2015 0550   LEUKOCYTESUR NEGATIVE 08/08/2015 0550   Recent Results (from the past 240 hour(s))  Culture, Urine     Status: Abnormal   Collection Time: 08/08/15  5:50 AM  Result Value Ref Range Status   Specimen Description URINE, RANDOM  Final   Special Requests cefpodoxime, azithromycin  Final   Culture MULTIPLE SPECIES PRESENT, SUGGEST RECOLLECTION (A)  Final   Report Status  08/09/2015 FINAL  Final  Respiratory Panel by PCR     Status: Abnormal   Collection Time: 08/09/15 11:50 PM  Result Value Ref Range Status   Adenovirus NOT DETECTED NOT DETECTED Final   Coronavirus 229E NOT DETECTED NOT DETECTED Final   Coronavirus HKU1 NOT DETECTED NOT DETECTED Final   Coronavirus NL63 NOT DETECTED NOT DETECTED Final   Coronavirus OC43 NOT DETECTED NOT DETECTED Final   Metapneumovirus NOT DETECTED NOT DETECTED Final   Rhinovirus / Enterovirus DETECTED (A) NOT DETECTED Final   Influenza A NOT DETECTED NOT DETECTED Final   Influenza A H1 NOT DETECTED NOT DETECTED Final   Influenza A H1 2009 NOT  DETECTED NOT DETECTED Final   Influenza A H3 NOT DETECTED NOT DETECTED Final   Influenza B NOT DETECTED NOT DETECTED Final   Parainfluenza Virus 1 NOT DETECTED NOT DETECTED Final   Parainfluenza Virus 2 NOT DETECTED NOT DETECTED Final   Parainfluenza Virus 3 NOT DETECTED NOT DETECTED Final   Parainfluenza Virus 4 NOT DETECTED NOT DETECTED Final   Respiratory Syncytial Virus NOT DETECTED NOT DETECTED Final   Bordetella pertussis NOT DETECTED NOT DETECTED Final   Chlamydophila pneumoniae NOT DETECTED NOT DETECTED Final   Mycoplasma pneumoniae NOT DETECTED NOT DETECTED Final      Radiology Studies: Ct Chest Wo Contrast 08/08/2015  Markedly abnormal pulmonary parenchyma which geographic areas of septal thickening and ground-glass opacity. This pattern was previously felt to be highly suggestive of alveolar proteinosis. However other diagnoses can also cause this pattern including atypical pneumonia, bronchoalveolar carcinoma, sarcoid, nonspecific interstitial pneumonia, cryptogenic organizing pneumonia, ARDS and pulmonary hemorrhage. Small amount of left pleural fluid with dependent atelectasis. Small to moderate pericardial effusion. Atherosclerosis of the aorta and the coronary arteries. 1 cm right paratracheal lymph node just beneath the thoracic inlet. There is not an extensive  pattern of lymphadenopathy.  Nm Pulmonary Perf And Vent 08/07/2015 Low probability pulmonary embolus.   Scheduled Meds: . azithromycin  500 mg Oral q1800  . cefpodoxime  200 mg Oral Daily  . citalopram  20 mg Oral Daily  . darbepoetin (ARANESP) injection - NON-DIALYSIS  60 mcg Subcutaneous Q Wed-1800  . heparin  5,000 Units Subcutaneous Q8H  . insulin aspart  0-9 Units Subcutaneous TID WC  . levothyroxine  50 mcg Oral QAC breakfast  . linaclotide  145 mcg Oral Daily  . methylPREDNISolone (SOLU-MEDROL) injection  40 mg Intravenous Q12H  . NIFEdipine  60 mg Oral Daily  . pantoprazole  40 mg Oral Daily  . pravastatin  80 mg Oral QHS  . saccharomyces boulardii  250 mg Oral BID  . sodium chloride flush  3 mL Intravenous Q12H  . sodium chloride flush  3 mL Intravenous Q12H  . sodium chloride flush  3 mL Intravenous Q12H  . sodium chloride flush  3 mL Intravenous Q12H   Continuous Infusions:     LOS: 10 days    Time spent: 20 minutes    Faye Ramsay, MD [facility] Pager [phone]  If 7PM-7AM, please contact night-coverage [email or web address] Password [facility] 08/12/2015, 10:26 AM

## 2015-08-12 NOTE — Progress Notes (Signed)
Name: [name] MRN: [id number] DOB: 1934/11/09    ADMISSION DATE:  08/01/2015 CONSULTATION DATE:  08/09/2015  REFERRING MD :  Doyle Askew  CHIEF COMPLAINT:  SOB  BRIEF PATIENT DESCRIPTION: 81 year old female admitted with SOB which has not responded to effective diuresis and IV ABX. Diffuse bilateral infiltrates on CT.  SUBJECTIVE: Lying flat in bed  on 02 4lpm, no cough - feels better and wants to go home   VITAL SIGNS: Temp:  [97.5 F (36.4 C)-98.1 F (36.7 C)] 97.8 F (36.6 C) (05/28 0521) Pulse Rate:  [74-84] 74 (05/28 0521) Resp:  [16-20] 20 (05/28 0521) BP: (108-151)/(59-74) 151/74 mmHg (05/28 0521) SpO2:  [93 %-100 %] 93 % (05/28 0521) Weight:  [133 lb 6.1 oz (60.5 kg)] 133 lb 6.1 oz (60.5 kg) (05/28 0521)  PHYSICAL EXAMINATION: General:  Elderly female in NAD lying in bed Neuro:  Alert, oriented, non-focal HEENT:  [place]/AT, PERRL, no JVD Cardiovascular:  RRR, no MRG, no JVD Lungs: even/non-labored on 3L, lungs good air movement/ min  Crackles bilateral  lower lobes Abdomen:  Soft, non-tender, non-distended Musculoskeletal:  No acute deformity or ROM limitation Skin:  Grossly intact   Recent Labs Lab 08/10/15 0634 08/11/15 0456 08/12/15 0212  NA 136 136 135  K 4.1 5.0 4.2  CL 99* 98* 97*  CO2 24 21* 21*  BUN 65* 78* 89*  CREATININE 6.04* 6.47* 6.38*  GLUCOSE 97 135* 107*    Recent Labs Lab 08/10/15 0634 08/11/15 0456 08/12/15 0212  HGB 8.1* 8.3* 8.6*  HCT 26.1* 27.3* 27.8*  WBC 11.5* 10.2 12.8*  PLT 397 459* 452*   Dg Chest 2 View  08/10/2015  ADDENDUM REPORT: 08/10/2015 16:36 ADDENDUM: The patient does not have a nasogastric tube. The structure felt to be a nasogastric tube is indeed an EKG lead. The patient's nurse is aware of these findings. Electronically Signed   By: Kathreen Devoid   On: 08/10/2015 16:36  08/10/2015  CLINICAL DATA:  Bilateral lung infiltrates, cough EXAM: CHEST  2 VIEW COMPARISON:  CT chest 08-27-15 FINDINGS: There are bilateral  patchy areas of airspace disease. There is a trace left pleural effusion. There is no pneumothorax. There is stable cardiomegaly. There is a nasogastric tube with the tip projecting over the stomach. The osseous structures are unremarkable. IMPRESSION: Bilateral patchy areas of airspace disease. Electronically Signed: By: Kathreen Devoid On: 08/10/2015 15:35   Dg Chest Port 1 View  08/11/2015  CLINICAL DATA:  81 year old female with history of pneumonitis. EXAM: PORTABLE CHEST 1 VIEW COMPARISON:  Chest x-ray 08/10/2015. FINDINGS: Lung volumes are low. Elevation of the right hemidiaphragm is unchanged. Patchy multifocal interstitial and airspace disease is again noted, asymmetrically distributed throughout the lungs bilaterally. Overall, aeration appears similar to slightly worsened compared to the prior examination, particularly in the right upper lobe, however, this may in part reflect lower lung volumes. No definite pleural effusions. Findings are not favored to reflect pulmonary edema, and pulmonary vasculature does not appear clearly engorged. Heart is mildly enlarged. Upper mediastinal contours are distorted by patient positioning. IMPRESSION: 1. Slight progression of asymmetrically distributed multilobar interstitial and airspace opacities throughout the lungs bilaterally, concerning for progressively worsening multilobar pneumonia. 2. Low lung volumes with chronic elevation of the right hemidiaphragm. Electronically Signed   By: Vinnie Langton M.D.   On: 08/11/2015 07:46   SIGNIFICANT EVENTS  5/17 admit 2022-08-27 RHC 5/25 Pulm consult  STUDIES:  5/23 V/Q > low prob PE 08/27/2022 RHC >  mild PAH (likely group 3), with PRV 2.9 normal PCWP and CO, resting hypoxemia.  No role for pulmonary vasodilators.  5/24 CT Chest > Markedly abnormal pulmonary parenchyma which geographic areas of septal thickening and ground-glass opacity. Small amount of left pleural fluid with dependent atelectasis. Small to moderate  pericardial effusion. Atherosclerosis of the aorta and the coronary arteries. 1 cm right paratracheal lymph node just beneath the thoracic inlet. There is not an extensive pattern of lymphadenopathy.  AUTOIMMUNE:  5/25  ESR >> 122 CRP >> 34.1  RF >> 21.3  ANA >>  ANCA >> ACE >> 22 Eosinophils >> 0.3  CULTURES:  BCx2 5/17 >>  Sputum 5/18 >> normal flora  RVP 5/25 >> Pos rhinovirus/enterovirus  ASSESSMENT / PLAN:  Dyspnea secondary to diffuse pulmonary infiltrates - etiology uncertain at this time as differential is broad and includes autoimmune disease ( RA), pulmonary fibrosis, BOOP, pulmonary edema, and viral pneumonitis. Particularly considering that she has failed ABX and diuresis.  Acute Hypoxemic Respiratory Failure   Plan: - Supplemental O2 as needed to maintain SpO2 > 92% - May require lung biopsy - PPI daily as ordered, consider swallow evaluation (pt denies symptoms) - Assess ambulatory O2 needs prior to discharge  - reccheck cxr/ esr am 5/29 >>>  Acute diastolic CHF HTN CAD Mild PH Pulmonary Edema   Plan: - Per cards- note procardia does interfere with hypoxic pulmonary vasoconstriction and can potentially contribute to v/q mismatch, increase in 02 dep in pts with lung dz  CKD stage IV  Plan: - Per nephrology     Christinia Gully, MD Pulmonary and [place] [phone] After 5:30 PM or weekends, call [phone]

## 2015-08-13 ENCOUNTER — Inpatient Hospital Stay (HOSPITAL_COMMUNITY): Payer: Medicare HMO

## 2015-08-13 LAB — GLUCOSE, CAPILLARY
GLUCOSE-CAPILLARY: 126 mg/dL — AB (ref 65–99)
GLUCOSE-CAPILLARY: 108 mg/dL — AB (ref 65–99)
Glucose-Capillary: 98 mg/dL (ref 65–99)
Glucose-Capillary: 144 mg/dL — ABNORMAL HIGH (ref 65–99)

## 2015-08-13 LAB — RENAL FUNCTION PANEL
ALBUMIN: 2.5 g/dL — AB (ref 3.5–5.0)
Anion gap: 14 (ref 5–15)
BUN: 87 mg/dL — ABNORMAL HIGH (ref 6–20)
CALCIUM: 7.5 mg/dL — AB (ref 8.9–10.3)
CO2: 25 mmol/L (ref 22–32)
CREATININE: 6.21 mg/dL — AB (ref 0.44–1.00)
Chloride: 99 mmol/L — ABNORMAL LOW (ref 101–111)
GFR calc Af Amer: 7 mL/min — ABNORMAL LOW (ref >60)
GFR calc non Af Amer: 6 mL/min — ABNORMAL LOW (ref >60)
GLUCOSE: 107 mg/dL — AB (ref 65–99)
Phosphorus: 6.1 mg/dL — ABNORMAL HIGH (ref 2.5–4.6)
Potassium: 4.1 mmol/L (ref 3.5–5.1)
SODIUM: 138 mmol/L (ref 135–145)

## 2015-08-13 LAB — CBC
HCT: 28.6 % — ABNORMAL LOW (ref 36.0–46.0)
Hemoglobin: 8.7 g/dL — ABNORMAL LOW (ref 12.0–15.0)
MCH: 28.4 pg (ref 26.0–34.0)
MCHC: 30.4 g/dL (ref 30.0–36.0)
MCV: 93.5 fL (ref 78.0–100.0)
PLATELETS: 496 10*3/uL — AB (ref 150–400)
RBC: 3.06 MIL/uL — ABNORMAL LOW (ref 3.87–5.11)
RDW: 15 % (ref 11.5–15.5)
WBC: 11.5 10*3/uL — ABNORMAL HIGH (ref 4.0–10.5)

## 2015-08-13 LAB — SEDIMENTATION RATE: SED RATE: 117 mm/h — AB (ref 0–22)

## 2015-08-13 MED ORDER — LOPERAMIDE HCL 2 MG PO CAPS
2.0000 mg | ORAL_CAPSULE | ORAL | Status: DC | PRN
  Administered 2015-08-13 (×2): 2 mg via ORAL
  Filled 2015-08-13: qty 1

## 2015-08-13 MED ORDER — DARBEPOETIN ALFA 150 MCG/0.3ML IJ SOSY
150.0000 ug | PREFILLED_SYRINGE | INTRAMUSCULAR | Status: DC
  Administered 2015-08-15: 150 ug via SUBCUTANEOUS
  Filled 2015-08-13: qty 0.3

## 2015-08-13 MED ORDER — NIFEDIPINE ER OSMOTIC RELEASE 30 MG PO TB24
60.0000 mg | ORAL_TABLET | Freq: Every day | ORAL | Status: DC
  Administered 2015-08-13 – 2015-08-15 (×3): 60 mg via ORAL
  Filled 2015-08-13 (×3): qty 2

## 2015-08-13 NOTE — Progress Notes (Signed)
Subjective: Interval History: has complaints wants to go home.  Objective: Vital signs in last 24 hours: Temp:  [97.4 F (36.3 C)-98.2 F (36.8 C)] 97.4 F (36.3 C) (05/29 0504) Pulse Rate:  [71-85] 71 (05/29 0504) Resp:  [18-19] 18 (05/29 0504) BP: (142-152)/(70-79) 152/76 mmHg (05/29 0504) SpO2:  [94 %-97 %] 95 % (05/29 0504) Weight:  [60.102 kg (132 lb 8 oz)] 60.102 kg (132 lb 8 oz) (05/29 0504) Weight change: -0.398 kg (-14.1 oz)  Intake/Output from previous day: 05/28 0701 - 05/29 0700 In: 480 [P.O.:480] Out: 400 [Urine:400] Intake/Output this shift: Total I/O In: 240 [P.O.:240] Out: 501 [Urine:500; Stool:1]  General appearance: alert, cooperative, no distress and pale Resp: rales bibasilar and wheezes bilaterally Cardio: S1, S2 normal and systolic murmur: holosystolic 2/6, blowing at apex GI: soft, non-tender; bowel sounds normal; no masses,  no organomegaly Extremities: AVF LUA  Lab Results:  Recent Labs  08/12/15 0212 08/13/15 0201  WBC 12.8* 11.5*  HGB 8.6* 8.7*  HCT 27.8* 28.6*  PLT 452* 496*   BMET:  Recent Labs  08/12/15 0212 08/13/15 0201  NA 135 138  K 4.2 4.1  CL 97* 99*  CO2 21* 25  GLUCOSE 107* 107*  BUN 89* 87*  CREATININE 6.38* 6.21*  CALCIUM 7.7* 7.5*   No results for input(s): PTH in the last 72 hours. Iron Studies: No results for input(s): IRON, TIBC, TRANSFERRIN, FERRITIN in the last 72 hours.  Studies/Results: Dg Chest 2 View  08/13/2015  CLINICAL DATA:  Shortness of breath EXAM: CHEST  2 VIEW COMPARISON:  08/11/2015 chest radiograph. FINDINGS: Stable cardiomediastinal silhouette with mild cardiomegaly. No pneumothorax. No pleural effusion. Stable elevation of the right hemidiaphragm. Improved lung volumes. Patchy consolidation and reticular opacities throughout both lungs are not appreciably changed. Partially visualized aorto bi-iliac stent graft in the medial right abdomen. IMPRESSION: Improved lung volumes. Patchy consolidation  and reticular opacities throughout both lungs are not appreciably changed, in the differential includes multilobar infectious or atypical inflammatory pneumonias. Stable cardiomegaly. Electronically Signed   By: Ilona Sorrel M.D.   On: 08/13/2015 08:22    I have reviewed the patient's current medications.  Assessment/Plan: 1 CKD4 now at 5.  Acid/base/K ok. Not uremic vol ok. 2 Pulm process appears to be primary issue  ? inflam 3 AAA 4 Anemia ^ esa,got Fe 5 HPTH check P PTH, follow chem    LOS: 11 days   [name] L 08/13/2015,8:50 AM

## 2015-08-13 NOTE — Progress Notes (Signed)
Patient ID: [name], female   DOB: 1934/10/24, 81 y.o.   MRN: [id number]    PROGRESS NOTE    [name]  [id number] DOB: May 23, 1934 DOA: 08/01/2015  PCP: Sherrie Mustache, MD   Brief Narrative:  81 year old Caucasian female with known coronary artery disease, hypertension, hyperlipidemia, hypothyroidism, anemia, presented with shortness of breath. Patient was initially started on IV antibiotics but rather slow to improve. Her creatinine worsened. Chest x-ray showed persistent infiltrates. She was given high dose Lasix. Nephrology was consulted. Patient however did not improve as anticipated. Cardiology was subsequently consulted.  Assessment & Plan:  AKI on chronic kidney disease stage V - baseline creatinine 3.6-3.9 - unclear etiology of worsening renal function  - appreciate nephrology team assistance  - mild improvement in Cr, hopefully we can hold off on HD  - BMP in AM  Acute on chronic systolic CHF - resolved  - reduced EF of 45-50%, RHC unremarkable  - no signs of volume overload on exam  - appreciate cardiology team following, they signed off as no further cardiac eval or intervention needed   Acute respiratory distress with hypoxia  - appears to be pulmonary in etiology, viral panel only positive for rhinovirus  - still oxygen dependent  - CT chest with diffuse ground glass opacities bilaterally with broad differential including sarcoidosis, PNA, malignancy  - PCCM consulted for assistance, placed on empiric solumedrol, today is day #4 of steroids  - current vasculitis work up negative  - completed 8 days of ABX   Hypokalemia - WNL this AM  - BMP in AM  Hypothyroidism - continue synthroid   HLD - continue statin   Thrombocytosis - reactive - CBC in AM  Anemia of iron deficiency, chronic disease  - iron saturation 5% with ferritin 52, status post 2 units PRBC. Status post intravenous iron - aranesp started per nephrology   DVT prophylaxis:  Heparin SQ  Code Status: Full   Family Communication: Patient and husband at bedside   Disposition Plan: home l- in few days   Consultants:   Cardiology - signed off on 5/25   PCCM consulted 5/25   Nephrology   Procedures:   Right heart cath 5/24 --> see below   Findings: RA = 10 RV = 37/10 PA = 41/10 (22) PCW = 8 Fick cardiac output/index = 4.8/2.8 PVR = 2.9 WU FA sat = 89% on 3L PA sat = 47%, 51%  Assessment: 1. Mild PAH (likely WHO Group 3) 2. Normal PCWP and cardiac output 3. Resting hypoxemia 4. Suspect dyspnea and mild PAH due to intrinsic lung disease. Left side pressures are normal. PVR normal.  5. No role for selective pulmonary vasodilators.   Antimicrobials:   Zithromax 5/20 --> 5/28  Vantin 5/20 --> 5/28   Subjective: Reports feeling better, has taken herself off of oxygen for few minutes and says she is doing better.   Objective: Filed Vitals:   08/12/15 0521 08/12/15 1411 08/12/15 1944 08/13/15 0504  BP: 151/74 146/79 142/70 152/76  Pulse: 74 78 85 71  Temp: 97.8 F (36.6 C) 97.9 F (36.6 C) 98.2 F (36.8 C) 97.4 F (36.3 C)  TempSrc: Oral Oral Oral Oral  Resp: 20 19 18 18   Weight: 60.5 kg (133 lb 6.1 oz)   60.102 kg (132 lb 8 oz)  SpO2: 93% 94% 97% 95%    Intake/Output Summary (Last 24 hours) at 08/13/15 0721 Last data filed at 08/12/15 1412  Gross per 24 hour  Intake    480 ml  Output    400 ml  Net     80 ml   Filed Weights   08/11/15 0512 08/12/15 0521 08/13/15 0504  Weight: 60.1 kg (132 lb 7.9 oz) 60.5 kg (133 lb 6.1 oz) 60.102 kg (132 lb 8 oz)    Examination:  General exam: Appears calm and comfortable  Respiratory system: diminished breath sounds at bases Cardiovascular system: S1 & S2 heard, RRR. No JVD, murmurs, rubs, gallops or clicks. No pedal edema. Gastrointestinal system: Abdomen is nondistended, soft and nontender.  Central nervous system: Alert and oriented. No focal neurological deficits. Psychiatry:  Judgement and insight appear normal. Mood & affect appropriate.   Data Reviewed: I have personally reviewed following labs and imaging studies  CBC:  Recent Labs Lab 08/09/15 0338 08/10/15 0634 08/11/15 0456 08/12/15 0212 08/13/15 0201  WBC 15.0* 11.5* 10.2 12.8* 11.5*  NEUTROABS  --  9.6*  --   --   --   HGB 8.7* 8.1* 8.3* 8.6* 8.7*  HCT 27.9* 26.1* 27.3* 27.8* 28.6*  MCV 90.6 92.2 92.5 93.6 93.5  PLT 410* 397 459* 452* [id number]*   Basic Metabolic Panel:  Recent Labs Lab 08/09/15 0338 08/10/15 0634 08/11/15 0456 08/12/15 0212 08/13/15 0201  NA 133* 136 136 135 138  K 3.9 4.1 5.0 4.2 4.1  CL 96* 99* 98* 97* 99*  CO2 22 24 21* 21* 25  GLUCOSE 117* 97 135* 107* 107*  BUN 62* 65* 78* 89* 87*  CREATININE 5.65* 6.04* 6.47* 6.38* 6.21*  CALCIUM 7.8* 7.8* 8.1* 7.7* 7.5*  PHOS 5.2* 5.9*  --  6.5* 6.1*   Coagulation Profile:  Recent Labs Lab 08/07/15 2003  INR 1.31   Cardiac Enzymes:  Recent Labs Lab 08/07/15 0826 08/07/15 1403 08/07/15 2003  TROPONINI 0.06* 0.06* 0.06*   Urine analysis:    Component Value Date/Time   COLORURINE YELLOW 08/08/2015 0550   APPEARANCEUR CLOUDY* 08/08/2015 0550   LABSPEC 1.015 08/08/2015 0550   PHURINE 5.5 08/08/2015 0550   GLUCOSEU NEGATIVE 08/08/2015 0550   HGBUR TRACE* 08/08/2015 0550   BILIRUBINUR NEGATIVE 08/08/2015 0550   KETONESUR NEGATIVE 08/08/2015 0550   PROTEINUR 30* 08/08/2015 0550   UROBILINOGEN 0.2 12/21/2012 1300   NITRITE NEGATIVE 08/08/2015 0550   LEUKOCYTESUR NEGATIVE 08/08/2015 0550   Recent Results (from the past 240 hour(s))  Culture, Urine     Status: Abnormal   Collection Time: 08/08/15  5:50 AM  Result Value Ref Range Status   Specimen Description URINE, RANDOM  Final   Special Requests cefpodoxime, azithromycin  Final   Culture MULTIPLE SPECIES PRESENT, SUGGEST RECOLLECTION (A)  Final   Report Status 08/09/2015 FINAL  Final  Respiratory Panel by PCR     Status: Abnormal   Collection Time: 08/09/15  11:50 PM  Result Value Ref Range Status   Adenovirus NOT DETECTED NOT DETECTED Final   Coronavirus 229E NOT DETECTED NOT DETECTED Final   Coronavirus HKU1 NOT DETECTED NOT DETECTED Final   Coronavirus NL63 NOT DETECTED NOT DETECTED Final   Coronavirus OC43 NOT DETECTED NOT DETECTED Final   Metapneumovirus NOT DETECTED NOT DETECTED Final   Rhinovirus / Enterovirus DETECTED (A) NOT DETECTED Final   Influenza A NOT DETECTED NOT DETECTED Final   Influenza A H1 NOT DETECTED NOT DETECTED Final   Influenza A H1 2009 NOT DETECTED NOT DETECTED Final   Influenza A H3 NOT DETECTED NOT DETECTED Final   Influenza B NOT DETECTED NOT DETECTED Final  Parainfluenza Virus 1 NOT DETECTED NOT DETECTED Final   Parainfluenza Virus 2 NOT DETECTED NOT DETECTED Final   Parainfluenza Virus 3 NOT DETECTED NOT DETECTED Final   Parainfluenza Virus 4 NOT DETECTED NOT DETECTED Final   Respiratory Syncytial Virus NOT DETECTED NOT DETECTED Final   Bordetella pertussis NOT DETECTED NOT DETECTED Final   Chlamydophila pneumoniae NOT DETECTED NOT DETECTED Final   Mycoplasma pneumoniae NOT DETECTED NOT DETECTED Final      Radiology Studies: Ct Chest Wo Contrast 08/08/2015  Markedly abnormal pulmonary parenchyma which geographic areas of septal thickening and ground-glass opacity. This pattern was previously felt to be highly suggestive of alveolar proteinosis. However other diagnoses can also cause this pattern including atypical pneumonia, bronchoalveolar carcinoma, sarcoid, nonspecific interstitial pneumonia, cryptogenic organizing pneumonia, ARDS and pulmonary hemorrhage. Small amount of left pleural fluid with dependent atelectasis. Small to moderate pericardial effusion. Atherosclerosis of the aorta and the coronary arteries. 1 cm right paratracheal lymph node just beneath the thoracic inlet. There is not an extensive pattern of lymphadenopathy.  Nm Pulmonary Perf And Vent 08/07/2015 Low probability pulmonary embolus.     Scheduled Meds: . azithromycin  500 mg Oral q1800  . cefpodoxime  200 mg Oral Daily  . citalopram  20 mg Oral Daily  . darbepoetin (ARANESP) injection - NON-DIALYSIS  60 mcg Subcutaneous Q Wed-1800  . heparin  5,000 Units Subcutaneous Q8H  . insulin aspart  0-9 Units Subcutaneous TID WC  . levothyroxine  50 mcg Oral QAC breakfast  . linaclotide  145 mcg Oral Daily  . methylPREDNISolone (SOLU-MEDROL) injection  40 mg Intravenous Q12H  . NIFEdipine  60 mg Oral Daily  . pantoprazole  40 mg Oral Daily  . pravastatin  80 mg Oral QHS  . saccharomyces boulardii  250 mg Oral BID  . sodium chloride flush  3 mL Intravenous Q12H  . sodium chloride flush  3 mL Intravenous Q12H  . sodium chloride flush  3 mL Intravenous Q12H  . sodium chloride flush  3 mL Intravenous Q12H   Continuous Infusions:     LOS: 11 days    Time spent: 20 minutes    Faye Ramsay, MD [facility] Pager [phone]  If 7PM-7AM, please contact night-coverage [email or web address] Password [email or web address] 08/13/2015, 7:21 AM

## 2015-08-13 NOTE — Progress Notes (Signed)
Name: [name] MRN: [id number] DOB: 1934-06-25    ADMISSION DATE:  08/01/2015 CONSULTATION DATE:  08/09/2015  REFERRING MD :  Doyle Askew  CHIEF COMPLAINT:  SOB  BRIEF PATIENT DESCRIPTION: 81 year old female admitted with SOB which has not responded to effective diuresis and IV ABX. Diffuse bilateral infiltrates on CT.  SUBJECTIVE:  Admits gradually better.  VITAL SIGNS: Temp:  [97.4 F (36.3 C)-98.2 F (36.8 C)] 97.4 F (36.3 C) (05/29 0504) Pulse Rate:  [71-85] 71 (05/29 0504) Resp:  [18-19] 18 (05/29 0504) BP: (142-152)/(70-79) 152/76 mmHg (05/29 0504) SpO2:  [94 %-97 %] 95 % (05/29 0504) Weight:  [60.102 kg (132 lb 8 oz)] 60.102 kg (132 lb 8 oz) (05/29 0504)  PHYSICAL EXAMINATION: General:  Elderly female in NAD lying in bed Neuro:  Alert, oriented, non-focal HEENT:  [place]/AT, PERRL, no JVD Cardiovascular:  RRR, no MRG, no JVD Lungs: even/non-labored on 3L, lungs good air movement/ min  Minimal crackles bilateral  lower lobes Abdomen:  Soft, non-tender, non-distended Musculoskeletal:  No acute deformity or ROM limitation Skin:  Grossly intact   Recent Labs Lab 08/11/15 0456 08/12/15 0212 08/13/15 0201  NA 136 135 138  K 5.0 4.2 4.1  CL 98* 97* 99*  CO2 21* 21* 25  BUN 78* 89* 87*  CREATININE 6.47* 6.38* 6.21*  GLUCOSE 135* 107* 107*    Recent Labs Lab 08/11/15 0456 08/12/15 0212 08/13/15 0201  HGB 8.3* 8.6* 8.7*  HCT 27.3* 27.8* 28.6*  WBC 10.2 12.8* 11.5*  PLT 459* 452* 496*   Dg Chest 2 View  08/13/2015  CLINICAL DATA:  Shortness of breath EXAM: CHEST  2 VIEW COMPARISON:  08/11/2015 chest radiograph. FINDINGS: Stable cardiomediastinal silhouette with mild cardiomegaly. No pneumothorax. No pleural effusion. Stable elevation of the right hemidiaphragm. Improved lung volumes. Patchy consolidation and reticular opacities throughout both lungs are not appreciably changed. Partially visualized aorto bi-iliac stent graft in the medial right abdomen.  IMPRESSION: Improved lung volumes. Patchy consolidation and reticular opacities throughout both lungs are not appreciably changed, in the differential includes multilobar infectious or atypical inflammatory pneumonias. Stable cardiomegaly. Electronically Signed   By: Ilona Sorrel M.D.   On: 08/13/2015 08:22   SIGNIFICANT EVENTS  5/17 admit August 20, 2022 RHC 5/25 Pulm consult  STUDIES:  5/23 V/Q > low prob PE 08/20/22 RHC > mild PAH (likely group 3), with PRV 2.9 normal PCWP and CO, resting hypoxemia.  No role for pulmonary vasodilators.  08/20/2022 CT Chest > Markedly abnormal pulmonary parenchyma which geographic areas of septal thickening and ground-glass opacity. Small amount of left pleural fluid with dependent atelectasis. Small to moderate pericardial effusion. Atherosclerosis of the aorta and the coronary arteries. 1 cm right paratracheal lymph node just beneath the thoracic inlet. There is not an extensive pattern of lymphadenopathy.  AUTOIMMUNE:  5/25  ESR >> 122 CRP >> 34.1  RF >> 21.3  ANA >>  ANCA >> ACE >> 22 Eosinophils >> 0.3  CULTURES:  BCx2 5/17 >>  Sputum 5/18 >> normal flora  RVP 5/25 >> Pos rhinovirus/enterovirus  ASSESSMENT / PLAN:  Dyspnea secondary to diffuse pulmonary infiltrates - etiology uncertain at this time as differential is broad and includes autoimmune disease ( RA), pulmonary fibrosis, BOOP, pulmonary edema, and viral pneumonitis. Particularly considering that she has failed ABX and diuresis.  Rhinovirus POS on panel Acute Hypoxemic Respiratory Failure   Plan: - Supplemental O2 as needed to maintain SpO2 > 92% - May require lung biopsy - PPI daily  as ordered, consider swallow evaluation (pt denies symptoms) - Assess ambulatory O2 needs prior to discharge  - recheck cxr/ esr am 5/29 > 458  Acute diastolic CHF HTN CAD Mild PH Pulmonary Edema   Plan: - Per cards- note procardia does interfere with hypoxic pulmonary vasoconstriction and can potentially  contribute to v/q mismatch, increase in 02 dep in pts with lung dz  CKD stage IV  Plan: - Per nephrology     CD Annamaria Boots, MD [facility]monary and [place] p [phone] After 3:00 PM call [phone]

## 2015-08-14 DIAGNOSIS — J849 Interstitial pulmonary disease, unspecified: Secondary | ICD-10-CM | POA: Insufficient documentation

## 2015-08-14 LAB — CBC
HEMATOCRIT: 30.7 % — AB (ref 36.0–46.0)
Hemoglobin: 9.3 g/dL — ABNORMAL LOW (ref 12.0–15.0)
MCH: 28.5 pg (ref 26.0–34.0)
MCHC: 30.3 g/dL (ref 30.0–36.0)
MCV: 94.2 fL (ref 78.0–100.0)
PLATELETS: 478 10*3/uL — AB (ref 150–400)
RBC: 3.26 MIL/uL — ABNORMAL LOW (ref 3.87–5.11)
RDW: 15.1 % (ref 11.5–15.5)
WBC: 12.2 10*3/uL — ABNORMAL HIGH (ref 4.0–10.5)

## 2015-08-14 LAB — RENAL FUNCTION PANEL
Albumin: 2.6 g/dL — ABNORMAL LOW (ref 3.5–5.0)
Anion gap: 14 (ref 5–15)
BUN: 83 mg/dL — AB (ref 6–20)
CHLORIDE: 104 mmol/L (ref 101–111)
CO2: 22 mmol/L (ref 22–32)
CREATININE: 5.66 mg/dL — AB (ref 0.44–1.00)
Calcium: 8 mg/dL — ABNORMAL LOW (ref 8.9–10.3)
GFR calc Af Amer: 7 mL/min — ABNORMAL LOW (ref >60)
GFR calc non Af Amer: 6 mL/min — ABNORMAL LOW (ref >60)
GLUCOSE: 107 mg/dL — AB (ref 65–99)
Phosphorus: 6.1 mg/dL — ABNORMAL HIGH (ref 2.5–4.6)
Potassium: 4.4 mmol/L (ref 3.5–5.1)
Sodium: 140 mmol/L (ref 135–145)

## 2015-08-14 LAB — PARATHYROID HORMONE, INTACT (NO CA): PTH: 352 pg/mL — AB (ref 15–65)

## 2015-08-14 LAB — GLUCOSE, CAPILLARY
Glucose-Capillary: 112 mg/dL — ABNORMAL HIGH (ref 65–99)
Glucose-Capillary: 107 mg/dL — ABNORMAL HIGH (ref 65–99)
Glucose-Capillary: 121 mg/dL — ABNORMAL HIGH (ref 65–99)
Glucose-Capillary: 129 mg/dL — ABNORMAL HIGH (ref 65–99)

## 2015-08-14 MED ORDER — ARFORMOTEROL TARTRATE 15 MCG/2ML IN NEBU
15.0000 ug | INHALATION_SOLUTION | Freq: Two times a day (BID) | RESPIRATORY_TRACT | Status: DC
  Administered 2015-08-14 – 2015-08-16 (×4): 15 ug via RESPIRATORY_TRACT
  Filled 2015-08-14 (×4): qty 2

## 2015-08-14 MED ORDER — TRAZODONE HCL 50 MG PO TABS: 25.0000 mg | ORAL_TABLET | Freq: Every evening | ORAL | Status: AC | PRN

## 2015-08-14 MED ORDER — BUDESONIDE 0.5 MG/2ML IN SUSP
0.5000 mg | Freq: Two times a day (BID) | RESPIRATORY_TRACT | Status: DC
  Administered 2015-08-14 – 2015-08-16 (×4): 0.5 mg via RESPIRATORY_TRACT
  Filled 2015-08-14 (×4): qty 2

## 2015-08-14 MED ORDER — CALCITRIOL 0.25 MCG PO CAPS
0.2500 ug | ORAL_CAPSULE | Freq: Every day | ORAL | Status: DC
  Administered 2015-08-14 – 2015-08-16 (×3): 0.25 ug via ORAL
  Filled 2015-08-14 (×3): qty 1

## 2015-08-14 MED ORDER — HYDROCODONE-ACETAMINOPHEN 5-325 MG PO TABS: 1.0000 | ORAL_TABLET | Freq: Four times a day (QID) | ORAL | Status: AC | PRN

## 2015-08-14 MED ORDER — ALBUTEROL SULFATE (2.5 MG/3ML) 0.083% IN NEBU: 2.5000 mg | INHALATION_SOLUTION | RESPIRATORY_TRACT | Status: DC | PRN

## 2015-08-14 NOTE — Progress Notes (Signed)
Subjective: Interval History: has no complaint, eating well, no N,V, did have loose stool yest..  Objective: Vital signs in last 24 hours: Temp:  [97.6 F (36.4 C)-98.4 F (36.9 C)] 97.6 F (36.4 C) (05/30 0623) Pulse Rate:  [71-75] 74 (05/30 0623) Resp:  [18-20] 18 (05/30 0623) BP: (140-173)/(71-81) 140/76 mmHg (05/30 0623) SpO2:  [94 %-98 %] 98 % (05/30 0623) Weight:  [58.695 kg (129 lb 6.4 oz)] 58.695 kg (129 lb 6.4 oz) (05/30 0623) Weight change: -1.406 kg (-3 lb 1.6 oz)  Intake/Output from previous day: 05/29 0701 - 05/30 0700 In: 480 [P.O.:480] Out: 801 [Urine:800; Stool:1] Intake/Output this shift:    General appearance: alert, cooperative and pale Resp: rales bilaterally and late inspir fine crackles Cardio: S1, S2 normal and systolic murmur: holosystolic 2/6, blowing at apex GI: soft, pos bs,  Extremities: AVF LUA  Lab Results:  Recent Labs  08/13/15 0201 08/14/15 0331  WBC 11.5* 12.2*  HGB 8.7* 9.3*  HCT 28.6* 30.7*  PLT 496* 478*   BMET:  Recent Labs  08/13/15 0201 08/14/15 0331  NA 138 140  K 4.1 4.4  CL 99* 104  CO2 25 22  GLUCOSE 107* 107*  BUN 87* 83*  CREATININE 6.21* 5.66*  CALCIUM 7.5* 8.0*    Recent Labs  08/13/15 0859  PTH 352*   Iron Studies: No results for input(s): IRON, TIBC, TRANSFERRIN, FERRITIN in the last 72 hours.  Studies/Results: Dg Chest 2 View  08/13/2015  CLINICAL DATA:  Shortness of breath EXAM: CHEST  2 VIEW COMPARISON:  08/11/2015 chest radiograph. FINDINGS: Stable cardiomediastinal silhouette with mild cardiomegaly. No pneumothorax. No pleural effusion. Stable elevation of the right hemidiaphragm. Improved lung volumes. Patchy consolidation and reticular opacities throughout both lungs are not appreciably changed. Partially visualized aorto bi-iliac stent graft in the medial right abdomen. IMPRESSION: Improved lung volumes. Patchy consolidation and reticular opacities throughout both lungs are not appreciably  changed, in the differential includes multilobar infectious or atypical inflammatory pneumonias. Stable cardiomegaly. Electronically Signed   By: Ilona Sorrel M.D.   On: 08/13/2015 08:22    I have reviewed the patient's current medications.  Assessment/Plan: 1 CKD 5  Cr just a little better but no real change in GFR.  Vol ok. Acid base ok.   2 Anemia on Fe/esa 3 HPTH given vit D 4 AAA 5 Pulm infilt ?? Viral, vs immunologic 6 hypothyroid P supportive care, esa, vit D , Fe.  If stable will s/o in 1-2 d    LOS: 12 days   Michalle Rademaker L 08/14/2015,8:24 AM

## 2015-08-14 NOTE — Progress Notes (Signed)
Patient ID: [name], female   DOB: October 04, 1934, 81 y.o.   MRN: [id number]    PROGRESS NOTE    [name]  [id number] DOB: 13-May-1934 DOA: 08/01/2015  PCP: Sherrie Mustache, MD   Brief Narrative:  81 year old Caucasian female with known coronary artery disease, hypertension, hyperlipidemia, hypothyroidism, anemia, presented with shortness of breath. Patient was initially started on IV antibiotics but rather slow to improve. Her creatinine worsened. Chest x-ray showed persistent infiltrates. She was given high dose Lasix. Nephrology was consulted. Patient however did not improve as anticipated. Cardiology was subsequently consulted.  Assessment & Plan:  AKI on chronic kidney disease stage V - baseline creatinine 3.6-3.9 - unclear etiology  - appreciate nephrology team assistance  - mild improvement in Cr, no significant change in GFR though  - BMP in AM - possible d/c home in 1-2 days  Acute on chronic systolic CHF - resolved  - reduced EF of 45-50%, RHC unremarkable  - no signs of volume overload on exam  - appreciate cardiology team following, they signed off as no further cardiac eval or intervention needed   Acute respiratory distress with hypoxia  - appears to be pulmonary in etiology, acute CHF resolved, viral panel only positive for rhinovirus  - still oxygen dependent, will try to taper off oxygen  - CT chest with diffuse ground glass opacities bilaterally with broad differential including sarcoidosis, PNA, malignancy  - PCCM consulted for assistance, placed on empiric solumedrol, today is day #5 of steroids  - current vasculitis work up negative  - completed 8 days of ABX, ABX have been discontinued - plan is to continue solumedrol till tomorrow with transition to oral Prednisone and if pt does well, can be discharged home with close follow up with PCCM, may not need to do lung biopsy at this time if pt continues to improve   Hypokalemia - WNL this AM  - BMP  in AM  Hypothyroidism - continue synthroid   HLD - continue statin   Thrombocytosis - reactive - CBC in AM  Anemia of iron deficiency, chronic disease  - iron saturation 5% with ferritin 52, status post 2 units PRBC. Status post intravenous iron - aranesp started per nephrology   DVT prophylaxis: Heparin SQ  Code Status: Full   Family Communication: Patient and husband at bedside. Husband voiced concern that pt has been here too long and we still can't tell what the cause of this illness is. I have explained to him the challenging situation at this time and why certain blood work takes time to come back. He was inquiring about the transfer to [facility]. I offered my support to him and family and told them that is their decision. They were appreciated of the time, their questions were answered to their satisfaction. They decided to stay here for now.   Disposition Plan: home in 1-2 days if PCCM and nephrology team OK  Consultants:   Cardiology - signed off on 5/25   PCCM consulted 5/25   Nephrology   Procedures:   Right heart cath 5/24 --> see below   Findings: RA = 10 RV = 37/10 PA = 41/10 (22) PCW = 8 Fick cardiac output/index = 4.8/2.8 PVR = 2.9 WU FA sat = 89% on 3L PA sat = 47%, 51%  Assessment: 1. Mild PAH (likely WHO Group 3) 2. Normal PCWP and cardiac output 3. Resting hypoxemia 4. Suspect dyspnea and mild PAH due to intrinsic lung disease. Left side pressures are  normal. PVR normal.  5. No role for selective pulmonary vasodilators.   Antimicrobials:   Zithromax 5/20 --> 5/28  Vantin 5/20 --> 5/28   Subjective: Reports feeling better, frustrated that is she has been here so long and don't have the exact diagnosis for her illness.   Objective: Filed Vitals:   08/13/15 1342 08/13/15 2124 08/14/15 0623 08/14/15 1345  BP: 145/71 173/81 140/76 126/62  Pulse: 75 71 74 76  Temp: 97.8 F (36.6 C) 98.4 F (36.9 C) 97.6 F (36.4 C) 98 F (36.7 C)    TempSrc: Oral Axillary Oral Oral  Resp: 18 20 18 18   Weight:   58.695 kg (129 lb 6.4 oz)   SpO2: 94% 96% 98% 88%    Intake/Output Summary (Last 24 hours) at 08/14/15 1435 Last data filed at 08/14/15 1300  Gross per 24 hour  Intake    360 ml  Output    250 ml  Net    110 ml   Filed Weights   08/12/15 0521 08/13/15 0504 08/14/15 0623  Weight: 60.5 kg (133 lb 6.1 oz) 60.102 kg (132 lb 8 oz) 58.695 kg (129 lb 6.4 oz)    Examination:  General exam: Appears calm and comfortable  Respiratory system: diminished breath sounds at bases Cardiovascular system: S1 & S2 heard, RRR. No JVD, murmurs, rubs, gallops or clicks. No pedal edema. Gastrointestinal system: Abdomen is nondistended, soft and nontender.  Central nervous system: Alert and oriented. No focal neurological deficits. Psychiatry: Judgement and insight appear normal. Mood & affect appropriate.   Data Reviewed: I have personally reviewed following labs and imaging studies  CBC:  Recent Labs Lab 08/10/15 0634 08/11/15 0456 08/12/15 0212 08/13/15 0201 08/14/15 0331  WBC 11.5* 10.2 12.8* 11.5* 12.2*  NEUTROABS 9.6*  --   --   --   --   HGB 8.1* 8.3* 8.6* 8.7* 9.3*  HCT 26.1* 27.3* 27.8* 28.6* 30.7*  MCV 92.2 92.5 93.6 93.5 94.2  PLT 397 459* 452* 496* 123456*   Basic Metabolic Panel:  Recent Labs Lab 08/09/15 0338 08/10/15 0634 08/11/15 0456 08/12/15 0212 08/13/15 0201 08/14/15 0331  NA 133* 136 136 135 138 140  K 3.9 4.1 5.0 4.2 4.1 4.4  CL 96* 99* 98* 97* 99* 104  CO2 22 24 21* 21* 25 22  GLUCOSE 117* 97 135* 107* 107* 107*  BUN 62* 65* 78* 89* 87* 83*  CREATININE 5.65* 6.04* 6.47* 6.38* 6.21* 5.66*  CALCIUM 7.8* 7.8* 8.1* 7.7* 7.5* 8.0*  PHOS 5.2* 5.9*  --  6.5* 6.1* 6.1*   Coagulation Profile:  Recent Labs Lab 08/07/15 2003  INR 1.31   Cardiac Enzymes:  Recent Labs Lab 08/07/15 2003  TROPONINI 0.06*   Urine analysis:    Component Value Date/Time   COLORURINE YELLOW 08/08/2015 0550    APPEARANCEUR CLOUDY* 08/08/2015 0550   LABSPEC 1.015 08/08/2015 0550   PHURINE 5.5 08/08/2015 0550   GLUCOSEU NEGATIVE 08/08/2015 0550   HGBUR TRACE* 08/08/2015 0550   BILIRUBINUR NEGATIVE 08/08/2015 0550   KETONESUR NEGATIVE 08/08/2015 0550   PROTEINUR 30* 08/08/2015 0550   UROBILINOGEN 0.2 12/21/2012 1300   NITRITE NEGATIVE 08/08/2015 0550   LEUKOCYTESUR NEGATIVE 08/08/2015 0550   Recent Results (from the past 240 hour(s))  Culture, Urine     Status: Abnormal   Collection Time: 08/08/15  5:50 AM  Result Value Ref Range Status   Specimen Description URINE, RANDOM  Final   Special Requests cefpodoxime, azithromycin  Final   Culture MULTIPLE  SPECIES PRESENT, SUGGEST RECOLLECTION (A)  Final   Report Status 08/09/2015 FINAL  Final  Respiratory Panel by PCR     Status: Abnormal   Collection Time: 08/09/15 11:50 PM  Result Value Ref Range Status   Adenovirus NOT DETECTED NOT DETECTED Final   Coronavirus 229E NOT DETECTED NOT DETECTED Final   Coronavirus HKU1 NOT DETECTED NOT DETECTED Final   Coronavirus NL63 NOT DETECTED NOT DETECTED Final   Coronavirus OC43 NOT DETECTED NOT DETECTED Final   Metapneumovirus NOT DETECTED NOT DETECTED Final   Rhinovirus / Enterovirus DETECTED (A) NOT DETECTED Final   Influenza A NOT DETECTED NOT DETECTED Final   Influenza A H1 NOT DETECTED NOT DETECTED Final   Influenza A H1 2009 NOT DETECTED NOT DETECTED Final   Influenza A H3 NOT DETECTED NOT DETECTED Final   Influenza B NOT DETECTED NOT DETECTED Final   Parainfluenza Virus 1 NOT DETECTED NOT DETECTED Final   Parainfluenza Virus 2 NOT DETECTED NOT DETECTED Final   Parainfluenza Virus 3 NOT DETECTED NOT DETECTED Final   Parainfluenza Virus 4 NOT DETECTED NOT DETECTED Final   Respiratory Syncytial Virus NOT DETECTED NOT DETECTED Final   Bordetella pertussis NOT DETECTED NOT DETECTED Final   Chlamydophila pneumoniae NOT DETECTED NOT DETECTED Final   Mycoplasma pneumoniae NOT DETECTED NOT DETECTED  Final      Radiology Studies: Ct Chest Wo Contrast 08/08/2015  Markedly abnormal pulmonary parenchyma which geographic areas of septal thickening and ground-glass opacity. This pattern was previously felt to be highly suggestive of alveolar proteinosis. However other diagnoses can also cause this pattern including atypical pneumonia, bronchoalveolar carcinoma, sarcoid, nonspecific interstitial pneumonia, cryptogenic organizing pneumonia, ARDS and pulmonary hemorrhage. Small amount of left pleural fluid with dependent atelectasis. Small to moderate pericardial effusion. Atherosclerosis of the aorta and the coronary arteries. 1 cm right paratracheal lymph node just beneath the thoracic inlet. There is not an extensive pattern of lymphadenopathy.  Nm Pulmonary Perf And Vent 08/07/2015 Low probability pulmonary embolus.   Scheduled Meds: . arformoterol  15 mcg Nebulization BID  . budesonide (PULMICORT) nebulizer solution  0.5 mg Nebulization BID  . calcitRIOL  0.25 mcg Oral Daily  . citalopram  20 mg Oral Daily  . [START ON 08/15/2015] darbepoetin (ARANESP) injection - NON-DIALYSIS  150 mcg Subcutaneous Q Wed-1800  . heparin  5,000 Units Subcutaneous Q8H  . insulin aspart  0-9 Units Subcutaneous TID WC  . levothyroxine  50 mcg Oral QAC breakfast  . linaclotide  145 mcg Oral Daily  . methylPREDNISolone (SOLU-MEDROL) injection  40 mg Intravenous Q12H  . NIFEdipine  60 mg Oral QHS  . pantoprazole  40 mg Oral Daily  . pravastatin  80 mg Oral QHS  . saccharomyces boulardii  250 mg Oral BID  . sodium chloride flush  3 mL Intravenous Q12H  . sodium chloride flush  3 mL Intravenous Q12H  . sodium chloride flush  3 mL Intravenous Q12H  . sodium chloride flush  3 mL Intravenous Q12H   Continuous Infusions:     LOS: 12 days    Time spent: 20 minutes    Faye Ramsay, MD [facility] Pager [phone]  If 7PM-7AM, please contact night-coverage [email or web address] Password  [email or web address] 08/14/2015, 2:35 PM

## 2015-08-14 NOTE — Progress Notes (Signed)
Patient lying in bed, no needs at this time. Call light within reach. 

## 2015-08-14 NOTE — Progress Notes (Signed)
Name: [name] MRN: [id number] DOB: Feb 06, 1935    ADMISSION DATE:  08/01/2015 CONSULTATION DATE:  08/09/2015  REFERRING MD :  Doyle Askew  CHIEF COMPLAINT:  SOB  BRIEF PATIENT DESCRIPTION: 81 year old female admitted with SOB which has not responded to effective diuresis and IV ABX. Diffuse bilateral infiltrates on CT.  SUBJECTIVE:  Admits gradually better since being on steroids.  No issues overnight.   VITAL SIGNS: Temp:  [97.6 F (36.4 C)-98.4 F (36.9 C)] 97.6 F (36.4 C) (05/30 0623) Pulse Rate:  [71-75] 74 (05/30 0623) Resp:  [18-20] 18 (05/30 0623) BP: (140-173)/(71-81) 140/76 mmHg (05/30 0623) SpO2:  [94 %-98 %] 98 % (05/30 0623) Weight:  [58.695 kg (129 lb 6.4 oz)] 58.695 kg (129 lb 6.4 oz) (05/30 5462)  PHYSICAL EXAMINATION: General:  Elderly female in NAD lying in bed Neuro:  Alert, oriented, non-focal HEENT:  [place]/AT, PERRL, no JVD Cardiovascular:  RRR, no MRG, no JVD Lungs: even/non-labored on 3L, lungs good air movement/ min  Minimal crackles bilateral  lower lobes Abdomen:  Soft, non-tender, non-distended Musculoskeletal:  No acute deformity or ROM limitation Skin:  Grossly intact   Recent Labs Lab 08/12/15 0212 08/13/15 0201 08/14/15 0331  NA 135 138 140  K 4.2 4.1 4.4  CL 97* 99* 104  CO2 21* 25 22  BUN 89* 87* 83*  CREATININE 6.38* 6.21* 5.66*  GLUCOSE 107* 107* 107*    Recent Labs Lab 08/12/15 0212 08/13/15 0201 08/14/15 0331  HGB 8.6* 8.7* 9.3*  HCT 27.8* 28.6* 30.7*  WBC 12.8* 11.5* 12.2*  PLT 452* 496* 478*   Dg Chest 2 View  08/13/2015  CLINICAL DATA:  Shortness of breath EXAM: CHEST  2 VIEW COMPARISON:  08/11/2015 chest radiograph. FINDINGS: Stable cardiomediastinal silhouette with mild cardiomegaly. No pneumothorax. No pleural effusion. Stable elevation of the right hemidiaphragm. Improved lung volumes. Patchy consolidation and reticular opacities throughout both lungs are not appreciably changed. Partially visualized aorto bi-iliac  stent graft in the medial right abdomen. IMPRESSION: Improved lung volumes. Patchy consolidation and reticular opacities throughout both lungs are not appreciably changed, in the differential includes multilobar infectious or atypical inflammatory pneumonias. Stable cardiomegaly. Electronically Signed   By: Ilona Sorrel M.D.   On: 08/13/2015 08:22   SIGNIFICANT EVENTS  5/17 admit Aug 18, 2022 RHC 5/25 Pulm consult  STUDIES:  5/23 V/Q > low prob PE Aug 18, 2022 RHC > mild PAH (likely group 3), with PRV 2.9 normal PCWP and CO, resting hypoxemia.  No role for pulmonary vasodilators.  Aug 18, 2022 CT Chest > Markedly abnormal pulmonary parenchyma which geographic areas of septal thickening and ground-glass opacity. Small amount of left pleural fluid with dependent atelectasis. Small to moderate pericardial effusion. Atherosclerosis of the aorta and the coronary arteries. 1 cm right paratracheal lymph node just beneath the thoracic inlet. There is not an extensive pattern of lymphadenopathy.  AUTOIMMUNE:  5/25  ESR >> 122 CRP >> 34.1  RF >> 21.3  ANA >>  ANCA >> ACE >> 22 Eosinophils >> 0.3  CULTURES:  BCx2 5/17 >>  Sputum 5/18 >> normal flora  RVP 5/25 >> Pos rhinovirus/enterovirus  ASSESSMENT / PLAN:  Dyspnea secondary to diffuse pulmonary infiltrates - etiology uncertain at this time as differential is broad and includes autoimmune disease ( RA), pulmonary fibrosis, BOOP, pulmonary edema, and viral pneumonitis. Particularly considering that she has failed ABX and diuresis.  Rhinovirus POS on panel Acute Hypoxemic Respiratory Failure   Plan: - Supplemental O2 as needed to maintain SpO2 >  92% - start pulmicort and brovana - Will require Bronch with BAL and lung biopsy > holding off on Bx for now. Can be done as an outpt.  - PPI daily as ordered, consider swallow evaluation (pt denies symptoms) - Assess ambulatory O2 needs prior to discharge  - CXR 5/29 > mildly improved.   Acute diastolic  CHF HTN CAD Mild PH Pulmonary Edema   Plan: - Per cards- note procardia does interfere with hypoxic pulmonary vasoconstriction and can potentially contribute to v/q mismatch, increase in 02 dep in pts with lung dz  CKD stage IV  Plan: - Per nephrology  Plan d/w pt and family. Pt thinking of transfering to [facility] 2/2 being considered for [place], MD 08/14/2015, 12:34 PM [place] [facility] Pager [phone] After 3 pm or if no answer, call [phone]

## 2015-08-15 LAB — CBC
HEMATOCRIT: 30.1 % — AB (ref 36.0–46.0)
HEMOGLOBIN: 9.1 g/dL — AB (ref 12.0–15.0)
MCH: 28.4 pg (ref 26.0–34.0)
MCHC: 30.2 g/dL (ref 30.0–36.0)
MCV: 94.1 fL (ref 78.0–100.0)
Platelets: 431 10*3/uL — ABNORMAL HIGH (ref 150–400)
RBC: 3.2 MIL/uL — ABNORMAL LOW (ref 3.87–5.11)
RDW: 15.1 % (ref 11.5–15.5)
WBC: 12.8 10*3/uL — AB (ref 4.0–10.5)

## 2015-08-15 LAB — RENAL FUNCTION PANEL
ALBUMIN: 2.6 g/dL — AB (ref 3.5–5.0)
ANION GAP: 13 (ref 5–15)
BUN: 77 mg/dL — ABNORMAL HIGH (ref 6–20)
CALCIUM: 7.8 mg/dL — AB (ref 8.9–10.3)
CO2: 21 mmol/L — ABNORMAL LOW (ref 22–32)
Chloride: 103 mmol/L (ref 101–111)
Creatinine, Ser: 5.4 mg/dL — ABNORMAL HIGH (ref 0.44–1.00)
GFR calc Af Amer: 8 mL/min — ABNORMAL LOW (ref >60)
GFR, EST NON AFRICAN AMERICAN: 7 mL/min — AB (ref >60)
GLUCOSE: 183 mg/dL — AB (ref 65–99)
PHOSPHORUS: 5.6 mg/dL — AB (ref 2.5–4.6)
POTASSIUM: 4.3 mmol/L (ref 3.5–5.1)
SODIUM: 137 mmol/L (ref 135–145)

## 2015-08-15 LAB — GLUCOSE, CAPILLARY
GLUCOSE-CAPILLARY: 120 mg/dL — AB (ref 65–99)
GLUCOSE-CAPILLARY: 109 mg/dL — AB (ref 65–99)
GLUCOSE-CAPILLARY: 127 mg/dL — AB (ref 65–99)
Glucose-Capillary: 227 mg/dL — ABNORMAL HIGH (ref 65–99)

## 2015-08-15 MED ORDER — FUROSEMIDE 80 MG PO TABS
80.0000 mg | ORAL_TABLET | Freq: Every day | ORAL | Status: DC
  Administered 2015-08-15 – 2015-08-16 (×2): 80 mg via ORAL
  Filled 2015-08-15 (×2): qty 1

## 2015-08-15 MED ORDER — PREDNISONE 20 MG PO TABS
40.0000 mg | ORAL_TABLET | Freq: Every day | ORAL | Status: DC
  Administered 2015-08-15 – 2015-08-16 (×2): 40 mg via ORAL
  Filled 2015-08-15 (×2): qty 2

## 2015-08-15 NOTE — Progress Notes (Signed)
Name: [name] MRN: [id number] DOB: Jan 08, 1935    ADMISSION DATE:  08/01/2015 CONSULTATION DATE:  08/09/2015  REFERRING MD :  Doyle Askew  CHIEF COMPLAINT:  SOB  BRIEF PATIENT DESCRIPTION: 81 year old female admitted with SOB which has not responded to effective diuresis and IV ABX. Diffuse bilateral infiltrates on CT.  SUBJECTIVE:  Admits gradually better since being on steroids.  No issues overnight.   VITAL SIGNS: Temp:  [97.8 F (36.6 C)-98.2 F (36.8 C)] 97.8 F (36.6 C) (05/31 0614) Pulse Rate:  [69-77] 76 (05/31 0944) Resp:  [16-20] 20 (05/31 0614) BP: (126-170)/(62-87) 156/68 mmHg (05/31 0944) SpO2:  [88 %-98 %] 94 % (05/31 0944) Weight:  [58.65 kg (129 lb 4.8 oz)] 58.65 kg (129 lb 4.8 oz) (05/31 6147)  PHYSICAL EXAMINATION: General:  Elderly female in NAD lying in bed Neuro:  Alert, oriented, non-focal HEENT:  [place]/AT, PERRL, no JVD Cardiovascular:  RRR, no MRG, no JVD Lungs: even/non-labored on 3L, lungs good air movement/ min  Minimal crackles bilateral  lower lobes Abdomen:  Soft, non-tender, non-distended Musculoskeletal:  No acute deformity or ROM limitation Skin:  Grossly intact   Recent Labs Lab 08/13/15 0201 08/14/15 0331 08/15/15 0248  NA 138 140 137  K 4.1 4.4 4.3  CL 99* 104 103  CO2 25 22 21*  BUN 87* 83* 77*  CREATININE 6.21* 5.66* 5.40*  GLUCOSE 107* 107* 183*    Recent Labs Lab 08/13/15 0201 08/14/15 0331 08/15/15 0248  HGB 8.7* 9.3* 9.1*  HCT 28.6* 30.7* 30.1*  WBC 11.5* 12.2* 12.8*  PLT 496* 478* 431*   No results found. SIGNIFICANT EVENTS  5/17 admit 08-21-22 RHC 5/25 Pulm consult  STUDIES:  5/23 V/Q > low prob PE 21-Aug-2022 RHC > mild PAH (likely group 3), with PRV 2.9 normal PCWP and CO, resting hypoxemia.  No role for pulmonary vasodilators.  21-Aug-2022 CT Chest > Markedly abnormal pulmonary parenchyma which geographic areas of septal thickening and ground-glass opacity. Small amount of left pleural fluid with dependent atelectasis.  Small to moderate pericardial effusion. Atherosclerosis of the aorta and the coronary arteries. 1 cm right paratracheal lymph node just beneath the thoracic inlet. There is not an extensive pattern of lymphadenopathy.  AUTOIMMUNE:  5/25  ESR >> 122 CRP >> 34.1  RF >> 21.3  elevated ANA >> no results seen ANCA >> (-) ACE >> 22 Eosinophils >> 0.3  CULTURES:  BCx2 5/17 >>  Sputum 5/18 >> normal flora  RVP 5/25 >> Pos rhinovirus/enterovirus  ASSESSMENT / PLAN:  Dyspnea secondary to diffuse pulmonary infiltrates - etiology uncertain at this time as differential is broad and includes autoimmune disease ( RA), pulmonary fibrosis, BOOP, pulmonary edema, and viral pneumonitis. Particularly considering that she has failed ABX and diuresis.  Rhinovirus POS on panel Acute Hypoxemic Respiratory Failure   Plan: - Supplemental O2 as needed to maintain SpO2 > 92% - cont pulmicort and brovana - will change to PO pred at 40 mg/d. Anticipate slow wean.  - Will require Bronch with BAL and lung biopsy > holding off on Bx for now. Can be done as an outpt.  - PPI daily as ordered, consider swallow evaluation (pt denies symptoms) - Assess ambulatory O2 needs prior to discharge  - CXR 5/29 > mildly improved.  - CTD w/u > RF was elevated. Will order CCP. Also, will order ANA, anti Jo, anti [place], anti Sm, HIV Ab    Acute diastolic CHF HTN CAD Mild PH Pulmonary Edema  Plan: - Per cards- note procardia does interfere with hypoxic pulmonary vasoconstriction and can potentially contribute to v/q mismatch, increase in 02 dep in pts with lung dz  CKD stage IV  Plan: - Per nephrology  Plan d/w pt and family.    Monica Becton, MD 08/15/2015, 9:53 AM [place] [facility] Pager [phone] After 3 pm or if no answer, call [phone]

## 2015-08-15 NOTE — Progress Notes (Signed)
Subjective: Interval History: has complaints wants to go home.  Objective: Vital signs in last 24 hours: Temp:  [97.8 F (36.6 C)-98.2 F (36.8 C)] 97.8 F (36.6 C) (05/31 0614) Pulse Rate:  [69-77] 69 (05/31 0614) Resp:  [16-20] 20 (05/31 0614) BP: (126-170)/(62-87) 162/82 mmHg (05/31 0614) SpO2:  [88 %-98 %] 96 % (05/31 0614) Weight:  [58.65 kg (129 lb 4.8 oz)] 58.65 kg (129 lb 4.8 oz) (05/31 [id number]) Weight change: -0.045 kg (-1.6 oz)  Intake/Output from previous day: 05/30 0701 - 05/31 0700 In: 240 [P.O.:240] Out: 850 [Urine:850] Intake/Output this shift:    General appearance: alert, cooperative, no distress and pale Resp: rales bibasilar Cardio: S1, S2 normal and systolic murmur: holosystolic 2/6, blowing at apex GI: soft, non-tender; bowel sounds normal; no masses,  no organomegaly Extremities: AVF LUA  Lab Results:  Recent Labs  08/14/15 0331 08/15/15 0248  WBC 12.2* 12.8*  HGB 9.3* 9.1*  HCT 30.7* 30.1*  PLT 478* 431*   BMET:  Recent Labs  08/14/15 0331 08/15/15 0248  NA 140 137  K 4.4 4.3  CL 104 103  CO2 22 21*  GLUCOSE 107* 183*  BUN 83* 77*  CREATININE 5.66* 5.40*  CALCIUM 8.0* 7.8*    Recent Labs  08/13/15 0859  PTH 352*   Iron Studies: No results for input(s): IRON, TIBC, TRANSFERRIN, FERRITIN in the last 72 hours.  Studies/Results: No results found.  I have reviewed the patient's current medications.  Assessment/Plan: 1 CKD 5 stable to better but very low GFR .  No indic for HD.  Will f/u with Dr. Florene Glen.  Has been eval for transplant and lung process will need to be resolved/clarified first. 2 Pulm infilt 3 Anemia on esa, Fe 4 HPTH on vit D 5 HTN add lasix 6 CAD 7 AAA P will set up with Dr. Florene Glen in 2 wk.   Cont esa/Vit D  Will see as outpatient    LOS: 13 days   [name] L 08/15/2015,9:04 AM

## 2015-08-15 NOTE — Evaluation (Signed)
Physical Therapy Evaluation Patient Details Name: [name] MRN: [id number] DOB: 02-05-1935 Today's Date: 08/15/2015   History of Present Illness  81 yo female with onset of pulmonary infiltrates adn CHF with 45-50% EF was admitted for SOB and chest pain.   Has AKI, acute respiratory failure with hypoxia, elevated K+, thrombocytosis.  PMHx:  AAA, MI, CAD  Clinical Impression  Pt is up to walk with increased control of balance and endurance per her report.  However, did note crossed steps and difficulty with distance on her hallway.  With 4L O2 was 99% O2 sat pregait and postgait WITH O2 was 80% with slow recovery to 95% once sitting.  Will continue to assist pt with gait and demonstrate a hopefully improved sat and better control of vitals without having to resort to supplemental O2.      Follow Up Recommendations Home health PT;Supervision for mobility/OOB    Equipment Recommendations  Rolling walker with 5" wheels    Recommendations for Other Services Rehab consult     Precautions / Restrictions Precautions Precautions: Fall (telemetry) Precaution Comments: check vitals with gait Restrictions Weight Bearing Restrictions: No      Mobility  Bed Mobility Overal bed mobility: Modified Independent                Transfers Overall transfer level: Modified independent Equipment used: 1 person hand held assist             General transfer comment: sit to stand and OOB to bedside with pt using bedrail and elevated HOB  Ambulation/Gait Ambulation/Gait assistance: Min guard Ambulation Distance (Feet): 125 Feet Assistive device: 1 person hand held assist (O2 tank along with the pt) Gait Pattern/deviations: Step-to pattern;Step-through pattern;Wide base of support;Drifts right/left;Shuffle Gait velocity: reduced Gait velocity interpretation: Below normal speed for age/gender    Stairs            Wheelchair Mobility    Modified Rankin (Stroke Patients  Only)       Balance Overall balance assessment: Needs assistance Sitting-balance support: Feet supported Sitting balance-Leahy Scale: Good     Standing balance support: Single extremity supported Standing balance-Leahy Scale: Fair Standing balance comment: fair- dynamic standing balance                             Pertinent Vitals/Pain Pain Assessment: No/denies pain    Home Living Family/patient expects to be discharged to:: Private residence Living Arrangements: Spouse/significant other Available Help at Discharge: Family;Available 24 hours/day Type of Home: House Home Access: Level entry     Home Layout: One level Home Equipment: None (bought an old walker at a yard sale not clearly in good shap)      Prior Function Level of Independence: Independent               Hand Dominance        Extremity/Trunk Assessment   Upper Extremity Assessment: Overall WFL for tasks assessed           Lower Extremity Assessment: Generalized weakness      Cervical / Trunk Assessment: Normal  Communication   Communication: No difficulties  Cognition Arousal/Alertness: Awake/alert Behavior During Therapy: WFL for tasks assessed/performed Overall Cognitive Status: Within Functional Limits for tasks assessed                      General Comments General comments (skin integrity, edema, etc.): Pt was seen for evaluation  and demonstrates some minor instability with a need for RW, also should be getting HHPT at home.  Husband will be with her to care for her.    Exercises        Assessment/Plan    PT Assessment Patient needs continued PT services  PT Diagnosis Difficulty walking   PT Problem List Decreased strength;Decreased range of motion;Decreased activity tolerance;Decreased balance;Decreased mobility;Decreased coordination;Decreased knowledge of use of DME;Decreased safety awareness;Decreased knowledge of precautions;Cardiopulmonary status  limiting activity  PT Treatment Interventions DME instruction;Gait training;Functional mobility training;Therapeutic activities;Therapeutic exercise;Balance training;Neuromuscular re-education;Patient/family education   PT Goals (Current goals can be found in the Care Plan section) Acute Rehab PT Goals Patient Stated Goal: To get home PT Goal Formulation: With patient Time For Goal Achievement: 08/29/15 Potential to Achieve Goals: Good    Frequency Min 2X/week   Barriers to discharge Other (comment) (will need to increase standing balance to be safer at home)      Co-evaluation               End of Session Equipment Utilized During Treatment: Gait belt;Oxygen Activity Tolerance: Patient tolerated treatment well;Patient limited by fatigue Patient left: in bed;with call bell/phone within reach;with bed alarm set Nurse Communication: Mobility status         Time: [id number] PT Time Calculation (min) (ACUTE ONLY): 15 min   Charges:   PT Evaluation $PT Eval Low Complexity: 1 Procedure     PT G CodesRamond Dial 2015-09-04, 6:01 PM    Mee Hives, PT MS Acute Rehab Dept. Number: [place] [facility] [place]

## 2015-08-15 NOTE — Progress Notes (Signed)
Patient ID: [name], female   DOB: 1934/07/03, 81 y.o.   MRN: [id number]    PROGRESS NOTE    [name]  [id number] DOB: 1935/01/30 DOA: 08/01/2015  PCP: Sherrie Mustache, MD   Brief Narrative:  81 year old Caucasian female with known coronary artery disease, hypertension, hyperlipidemia, hypothyroidism, anemia, presented with shortness of breath. Patient was initially started on IV antibiotics but rather slow to improve. Her creatinine worsened. Chest x-ray showed persistent infiltrates. She was given high dose Lasix. Nephrology was consulted. Patient however did not improve as anticipated. Cardiology was subsequently consulted.  Assessment & Plan:  AKI on chronic kidney disease stage V - baseline creatinine 3.6-3.9 - appreciate nephrology team assistance  - mild improvement in Cr, no significant change in GFR though  - BMP in AM, nephrology team recommends outpatient follow up   Acute on chronic systolic CHF - resolved  - reduced EF of 45-50%, RHC unremarkable  - no signs of volume overload on exam  - appreciate cardiology team following, they signed off as no further cardiac eval or intervention needed   Acute respiratory distress with hypoxia  - appears to be pulmonary in etiology, acute CHF resolved, viral panel only positive for rhinovirus  - still oxygen dependent, will continue to try to taper off oxygen  - CT chest with diffuse ground glass opacities bilaterally with broad differential including sarcoidosis, PNA, malignancy  - PCCM consulted for assistance, placed on empiric solumedrol, today is day #6, plan is to transition to oral Prednisone today  - completed 8 days of ABX, ABX have been discontinued  Hypokalemia - WNL this AM   Hypothyroidism - continue synthroid   HLD - continue statin   Thrombocytosis - reactive, improving  - CBC in AM  Anemia of iron deficiency, chronic disease  - iron saturation 5% with ferritin 52, status post 2 units  PRBC. Status post intravenous iron - aranesp started per nephrology   DVT prophylaxis: Heparin SQ  Code Status: Full   Family Communication: Patient and husband at bedside. Husband voiced concern that pt has been here too long and we still can't tell what the cause of this illness is. I have explained to him the challenging situation at this time and why certain blood work takes time to come back. He was inquiring about the transfer to [facility]. I offered my support to him and family and told them that is their decision. They were appreciated of the time, their questions were answered to their satisfaction. They decided to stay here for now.   Disposition Plan: home in 1-2 days if PCCM team OK  Consultants:   Cardiology - signed off on 5/25   PCCM consulted 5/25   Nephrology   Procedures:   Right heart cath 5/24 --> see below   Findings: RA = 10 RV = 37/10 PA = 41/10 (22) PCW = 8 Fick cardiac output/index = 4.8/2.8 PVR = 2.9 WU FA sat = 89% on 3L PA sat = 47%, 51%  Assessment: 1. Mild PAH (likely WHO Group 3) 2. Normal PCWP and cardiac output 3. Resting hypoxemia 4. Suspect dyspnea and mild PAH due to intrinsic lung disease. Left side pressures are normal. PVR normal.  5. No role for selective pulmonary vasodilators.   Antimicrobials:   Zithromax 5/20 --> 5/28  Vantin 5/20 --> 5/28   Subjective: Reports feeling better, frustrated that is she has been here so long and don't have the exact diagnosis for her illness.  Objective: Filed Vitals:   08/14/15 2113 08/15/15 0614 08/15/15 0944 08/15/15 1321  BP:  162/82 156/68 135/68  Pulse: 76 69 76 77  Temp:  97.8 F (36.6 C)  97.8 F (36.6 C)  TempSrc:  Oral  Oral  Resp: 16 20  18   Weight:  58.65 kg (129 lb 4.8 oz)    SpO2:  96% 94% 96%    Intake/Output Summary (Last 24 hours) at 08/15/15 1342 Last data filed at 08/15/15 1300  Gross per 24 hour  Intake    360 ml  Output   1351 ml  Net   -991 ml   Filed  Weights   08/13/15 0504 08/14/15 0623 08/15/15 0614  Weight: 60.102 kg (132 lb 8 oz) 58.695 kg (129 lb 6.4 oz) 58.65 kg (129 lb 4.8 oz)    Examination:  General exam: Appears calm and comfortable  Respiratory system: diminished breath sounds at bases Cardiovascular system: S1 & S2 heard, RRR. No JVD, murmurs, rubs, gallops or clicks. No pedal edema. Gastrointestinal system: Abdomen is nondistended, soft and nontender.  Central nervous system: Alert and oriented. No focal neurological deficits. Psychiatry: Judgement and insight appear normal. Mood & affect appropriate.   Data Reviewed: I have personally reviewed following labs and imaging studies  CBC:  Recent Labs Lab 08/10/15 0634 08/11/15 0456 08/12/15 0212 08/13/15 0201 08/14/15 0331 08/15/15 0248  WBC 11.5* 10.2 12.8* 11.5* 12.2* 12.8*  NEUTROABS 9.6*  --   --   --   --   --   HGB 8.1* 8.3* 8.6* 8.7* 9.3* 9.1*  HCT 26.1* 27.3* 27.8* 28.6* 30.7* 30.1*  MCV 92.2 92.5 93.6 93.5 94.2 94.1  PLT 397 459* 452* 496* 478* [id number]*   Basic Metabolic Panel:  Recent Labs Lab 08/10/15 0634 08/11/15 0456 08/12/15 0212 08/13/15 0201 08/14/15 0331 08/15/15 0248  NA 136 136 135 138 140 137  K 4.1 5.0 4.2 4.1 4.4 4.3  CL 99* 98* 97* 99* 104 103  CO2 24 21* 21* 25 22 21*  GLUCOSE 97 135* 107* 107* 107* 183*  BUN 65* 78* 89* 87* 83* 77*  CREATININE 6.04* 6.47* 6.38* 6.21* 5.66* 5.40*  CALCIUM 7.8* 8.1* 7.7* 7.5* 8.0* 7.8*  PHOS 5.9*  --  6.5* 6.1* 6.1* 5.6*   Urine analysis:    Component Value Date/Time   COLORURINE YELLOW 08/08/2015 0550   APPEARANCEUR CLOUDY* 08/08/2015 0550   LABSPEC 1.015 08/08/2015 0550   PHURINE 5.5 08/08/2015 0550   GLUCOSEU NEGATIVE 08/08/2015 0550   HGBUR TRACE* 08/08/2015 0550   BILIRUBINUR NEGATIVE 08/08/2015 0550   KETONESUR NEGATIVE 08/08/2015 0550   PROTEINUR 30* 08/08/2015 0550   UROBILINOGEN 0.2 12/21/2012 1300   NITRITE NEGATIVE 08/08/2015 0550   LEUKOCYTESUR NEGATIVE 08/08/2015 0550    Recent Results (from the past 240 hour(s))  Culture, Urine     Status: Abnormal   Collection Time: 08/08/15  5:50 AM  Result Value Ref Range Status   Specimen Description URINE, RANDOM  Final   Special Requests cefpodoxime, azithromycin  Final   Culture MULTIPLE SPECIES PRESENT, SUGGEST RECOLLECTION (A)  Final   Report Status 08/09/2015 FINAL  Final  Respiratory Panel by PCR     Status: Abnormal   Collection Time: 08/09/15 11:50 PM  Result Value Ref Range Status   Adenovirus NOT DETECTED NOT DETECTED Final   Coronavirus 229E NOT DETECTED NOT DETECTED Final   Coronavirus HKU1 NOT DETECTED NOT DETECTED Final   Coronavirus NL63 NOT DETECTED NOT DETECTED Final  Coronavirus OC43 NOT DETECTED NOT DETECTED Final   Metapneumovirus NOT DETECTED NOT DETECTED Final   Rhinovirus / Enterovirus DETECTED (A) NOT DETECTED Final   Influenza A NOT DETECTED NOT DETECTED Final   Influenza A H1 NOT DETECTED NOT DETECTED Final   Influenza A H1 2009 NOT DETECTED NOT DETECTED Final   Influenza A H3 NOT DETECTED NOT DETECTED Final   Influenza B NOT DETECTED NOT DETECTED Final   Parainfluenza Virus 1 NOT DETECTED NOT DETECTED Final   Parainfluenza Virus 2 NOT DETECTED NOT DETECTED Final   Parainfluenza Virus 3 NOT DETECTED NOT DETECTED Final   Parainfluenza Virus 4 NOT DETECTED NOT DETECTED Final   Respiratory Syncytial Virus NOT DETECTED NOT DETECTED Final   Bordetella pertussis NOT DETECTED NOT DETECTED Final   Chlamydophila pneumoniae NOT DETECTED NOT DETECTED Final   Mycoplasma pneumoniae NOT DETECTED NOT DETECTED Final      Radiology Studies: Ct Chest Wo Contrast 08/08/2015  Markedly abnormal pulmonary parenchyma which geographic areas of septal thickening and ground-glass opacity. This pattern was previously felt to be highly suggestive of alveolar proteinosis. However other diagnoses can also cause this pattern including atypical pneumonia, bronchoalveolar carcinoma, sarcoid, nonspecific  interstitial pneumonia, cryptogenic organizing pneumonia, ARDS and pulmonary hemorrhage. Small amount of left pleural fluid with dependent atelectasis. Small to moderate pericardial effusion. Atherosclerosis of the aorta and the coronary arteries. 1 cm right paratracheal lymph node just beneath the thoracic inlet. There is not an extensive pattern of lymphadenopathy.  Nm Pulmonary Perf And Vent 08/07/2015 Low probability pulmonary embolus.   Scheduled Meds: . arformoterol  15 mcg Nebulization BID  . budesonide (PULMICORT) nebulizer solution  0.5 mg Nebulization BID  . calcitRIOL  0.25 mcg Oral Daily  . citalopram  20 mg Oral Daily  . darbepoetin (ARANESP) injection - NON-DIALYSIS  150 mcg Subcutaneous Q Wed-1800  . furosemide  80 mg Oral Daily  . heparin  5,000 Units Subcutaneous Q8H  . insulin aspart  0-9 Units Subcutaneous TID WC  . levothyroxine  50 mcg Oral QAC breakfast  . linaclotide  145 mcg Oral Daily  . NIFEdipine  60 mg Oral QHS  . pantoprazole  40 mg Oral Daily  . pravastatin  80 mg Oral QHS  . predniSONE  40 mg Oral Q breakfast  . saccharomyces boulardii  250 mg Oral BID   Continuous Infusions:     LOS: 13 days    Time spent: 20 minutes    Faye Ramsay, MD [facility] Pager [phone]  If 7PM-7AM, please contact night-coverage [email or web address] Password [email or web address] 08/15/2015, 1:42 PM

## 2015-08-16 LAB — CBC
HCT: 29.5 % — ABNORMAL LOW (ref 36.0–46.0)
HEMOGLOBIN: 9 g/dL — AB (ref 12.0–15.0)
MCH: 28.7 pg (ref 26.0–34.0)
MCHC: 30.5 g/dL (ref 30.0–36.0)
MCV: 93.9 fL (ref 78.0–100.0)
PLATELETS: 409 10*3/uL — AB (ref 150–400)
RBC: 3.14 MIL/uL — AB (ref 3.87–5.11)
RDW: 15.7 % — ABNORMAL HIGH (ref 11.5–15.5)
WBC: 13.6 10*3/uL — AB (ref 4.0–10.5)

## 2015-08-16 LAB — BASIC METABOLIC PANEL
ANION GAP: 10 (ref 5–15)
BUN: 68 mg/dL — ABNORMAL HIGH (ref 6–20)
CHLORIDE: 107 mmol/L (ref 101–111)
CO2: 22 mmol/L (ref 22–32)
Calcium: 8.1 mg/dL — ABNORMAL LOW (ref 8.9–10.3)
Creatinine, Ser: 5.03 mg/dL — ABNORMAL HIGH (ref 0.44–1.00)
GFR, EST AFRICAN AMERICAN: 9 mL/min — AB (ref >60)
GFR, EST NON AFRICAN AMERICAN: 7 mL/min — AB (ref >60)
GLUCOSE: 119 mg/dL — AB (ref 65–99)
POTASSIUM: 4.2 mmol/L (ref 3.5–5.1)
SODIUM: 139 mmol/L (ref 135–145)

## 2015-08-16 LAB — GLUCOSE, CAPILLARY
GLUCOSE-CAPILLARY: 108 mg/dL — AB (ref 65–99)
Glucose-Capillary: 103 mg/dL — ABNORMAL HIGH (ref 65–99)

## 2015-08-16 LAB — ANTI-SCLERODERMA ANTIBODY: Scleroderma (Scl-70) (ENA) Antibody, IgG: <0.2 AI (ref 0.0–0.9)

## 2015-08-16 LAB — CYCLIC CITRUL PEPTIDE ANTIBODY, IGG/IGA: CCP Antibodies IgG/IgA: 4 units (ref 0–19)

## 2015-08-16 LAB — ANTI-SMITH ANTIBODY

## 2015-08-16 LAB — HIV ANTIBODY (ROUTINE TESTING W REFLEX): HIV SCREEN 4TH GENERATION: NONREACTIVE

## 2015-08-16 LAB — ANTINUCLEAR ANTIBODIES, IFA: ANTINUCLEAR ANTIBODIES, IFA: NEGATIVE

## 2015-08-16 LAB — ANTI-JO 1 ANTIBODY, IGG: Anti JO-1: <0.2 AI (ref 0.0–0.9)

## 2015-08-16 MED ORDER — FUROSEMIDE 80 MG PO TABS
80.0000 mg | ORAL_TABLET | Freq: Every day | ORAL | Status: AC
Start: 2015-08-16 — End: ?

## 2015-08-16 MED ORDER — BUDESONIDE 0.5 MG/2ML IN SUSP: 0.5000 mg | Freq: Two times a day (BID) | RESPIRATORY_TRACT | Status: AC

## 2015-08-16 MED ORDER — SACCHAROMYCES BOULARDII 250 MG PO CAPS: 250.0000 mg | ORAL_CAPSULE | Freq: Two times a day (BID) | ORAL | Status: DC

## 2015-08-16 MED ORDER — CALCITRIOL 0.25 MCG PO CAPS: 0.2500 ug | ORAL_CAPSULE | Freq: Every day | ORAL | Status: AC

## 2015-08-16 MED ORDER — PREDNISONE 20 MG PO TABS: ORAL_TABLET | ORAL | Status: AC

## 2015-08-16 MED ORDER — ARFORMOTEROL TARTRATE 15 MCG/2ML IN NEBU: 15.0000 ug | INHALATION_SOLUTION | Freq: Two times a day (BID) | RESPIRATORY_TRACT | Status: AC

## 2015-08-16 MED ORDER — IPRATROPIUM-ALBUTEROL 0.5-2.5 (3) MG/3ML IN SOLN: 3.0000 mL | Freq: Three times a day (TID) | RESPIRATORY_TRACT | Status: AC

## 2015-08-16 MED ORDER — ACETAMINOPHEN 325 MG PO TABS: 650.0000 mg | ORAL_TABLET | ORAL | Status: DC | PRN

## 2015-08-16 MED ORDER — ARFORMOTEROL TARTRATE 15 MCG/2ML IN NEBU: 15.0000 ug | INHALATION_SOLUTION | Freq: Two times a day (BID) | RESPIRATORY_TRACT | Status: DC

## 2015-08-16 NOTE — Progress Notes (Signed)
Name: [name] MRN: [id number] DOB: [birth year]    ADMISSION DATE:  08/01/2015 CONSULTATION DATE:  08/09/2015  REFERRING MD :  Doyle Askew  CHIEF COMPLAINT:  SOB  BRIEF PATIENT DESCRIPTION: 81 year old female admitted with SOB which has not responded to effective diuresis and IV ABX. Diffuse bilateral infiltrates on CT.  SUBJECTIVE:  Slowly breathing better since initiation of steroids. Wants to go home.  VITAL SIGNS: Temp:  [97.8 F (36.6 C)-98.2 F (36.8 C)] 98.1 F (36.7 C) (06/01 0508) Pulse Rate:  [76-77] 77 (06/01 0943) Resp:  [18] 18 (06/01 0508) BP: (135-175)/(62-84) 143/62 mmHg (06/01 0943) SpO2:  [94 %-98 %] 98 % (06/01 0943) Weight:  [58.6 kg (129 lb 3 oz)] 58.6 kg (129 lb 3 oz) (06/01 0508)  PHYSICAL EXAMINATION: General:  Elderly female in NAD lying in bed Neuro:  Alert, oriented, non-focal HEENT:  [place]/AT, PERRL, no JVD Cardiovascular:  RRR, no MRG, no JVD Lungs: even/non-labored on 3L, lungs good air movement/ min  Minimal crackles bilateral  lower lobes Abdomen:  Soft, non-tender, non-distended Musculoskeletal:  No acute deformity or ROM limitation Skin:  Grossly intact   Recent Labs Lab 08/14/15 0331 08/15/15 0248 08/16/15 0320  NA 140 137 139  K 4.4 4.3 4.2  CL 104 103 107  CO2 22 21* 22  BUN 83* 77* 68*  CREATININE 5.66* 5.40* 5.03*  GLUCOSE 107* 183* 119*    Recent Labs Lab 08/14/15 0331 08/15/15 0248 08/16/15 0320  HGB 9.3* 9.1* 9.0*  HCT 30.7* 30.1* 29.5*  WBC 12.2* 12.8* 13.6*  PLT 478* 431* 409*   No results found. SIGNIFICANT EVENTS  5/17 admit Aug 14, 2022 RHC 5/25 Pulm consult  STUDIES:  5/23 V/Q > low prob PE Aug 14, 2022 RHC > mild PAH (likely group 3), with PRV 2.9 normal PCWP and CO, resting hypoxemia.  No role for pulmonary vasodilators.  Aug 14, 2022 CT Chest > Markedly abnormal pulmonary parenchyma which geographic areas of septal thickening and ground-glass opacity. Small amount of left pleural fluid with dependent atelectasis. Small to  moderate pericardial effusion. Atherosclerosis of the aorta and the coronary arteries. 1 cm right paratracheal lymph node just beneath the thoracic inlet. There is not an extensive pattern of lymphadenopathy.  AUTOIMMUNE:  5/25  ESR >> 122 CRP >> 34.1  RF >> 21.3  elevated ANA >> no results seen ANCA >> (-) ACE >> 22 Eosinophils >> 0.3  5/31 HIV - non-reactive > Anti smith > Anti scleroderma > Anti jo > ANA >  CULTURES:  BCx2 5/17 >>  Sputum 5/18 >> normal flora  RVP 5/25 >> Pos rhinovirus/enterovirus  ASSESSMENT / PLAN:  Dyspnea secondary to diffuse pulmonary infiltrates - etiology uncertain at this time as differential is broad and includes autoimmune disease ( RA), pulmonary fibrosis, BOOP, pulmonary edema, and viral pneumonitis. Particularly considering that she has failed ABX and diuresis.  Rhinovirus POS on panel Acute Hypoxemic Respiratory Failure   Plan: - Supplemental O2 as needed to maintain SpO2 > 92%.  -Currently on 3L O2 24/7 and she desaturates in high 80s with exertion. Likely will need O2 3L 24/7 with rest, 4L with exertion.  Will order O2 for d/c.  - cont pulmicort and brovana > will ask case manager if we can Rx nebulizer and Budesonide neb 0.5 mg BID and Duoneb qid via neb.  - PO pred  40 mg/d > plan for pred 40 mg/d x 5 days, then 30 mg/d x 5 days then 20 mg/d x 5 days, then  10 mg/d until f/u with pulmonary. - pt advised to call office if with worsening SOB with pred taper > might need to be seen sooner. -needs rpt CXR on f/u. Needs a rpt chest ct scan end of June.  - PPI daily as ordered - CTD w/u > RF was elevated. Follow CCP, ANA, anti Jo, anti [place], anti Sm - Pulmonary follow up after discharge - Will require Bronch with BAL and lung biopsy > holding off on Bx for now. Can be done as an outpt.   Acute diastolic CHF HTN CAD Mild PH Pulmonary Edema   Plan: - Per cards- note procardia does interfere with hypoxic pulmonary vasoconstriction and can  potentially contribute to v/q mismatch, increase in 02 dep in pts with lung dz  CKD stage IV  Plan: - Per nephrology   Georgann Housekeeper, AGACNP-BC [place] Pager [phone] or [phone]  08/16/2015 10:30 AM   ATTENDING NOTE / ATTESTATION NOTE :   I have discussed the case with the resident/APP  Georgann Housekeeper.  I agree with the resident/APP's  history, physical examination, assessment, and plans.  I have edited the above note and modified it according to our agreed history, physical examination, assessment and plan.    Family :Family updated at length today.  Pls see A and P as above, especially bolded parts of discussion.  F/U with me June 14 1:30pm   [facility] [address] 2nd floor  [phone]   J. Shirl Harris, MD 08/16/2015, 10:38 AM [facility] Pager [phone] After 3 pm or if no answer, call [phone]

## 2015-08-16 NOTE — Progress Notes (Signed)
Home oxygen assessment: while patient was laying down at rest on 3L of oxygen sats remained at 97%, while resting I turned off oxygen and sats dropped to 89-90% on room air. Placed patient in sitting position, turned oxygen back on and saturations were at 95% while sitting at rest with 3L of oxygen. During our walk patient remained on 3L and sats began to dip down into the 80s, after 169ft of ambulation sats was at 83% on the 3L. Once back to the room and resting again they slowly came back up to 96% over a course of five minutes.  Cyndia Bent

## 2015-08-16 NOTE — Progress Notes (Addendum)
SATURATION QUALIFICATIONS: (This note is used to comply with regulatory documentation for home oxygen)  Patient Saturations on Room Air at Rest = 88%  Patient Saturations on Room Air while Ambulating = 78%  Patient Saturations on 3 Liters of oxygen while Ambulating = 83%  Please briefly explain why patient needs home oxygen:  Home oxygen assessment: while patient was laying down at rest on 3L of oxygen sats remained at 97%, while resting I turned off oxygen and sats dropped to 88% on room air. Placed patient in sitting position, turned oxygen back on and saturations were at 95% while sitting at rest with 3L of oxygen. During our walk patient remained on 3L and sats began to dip down into the 80s, after 153ft of ambulation sats was at 83% on the 3L. When oxygen is turned off during ambulation patient becomes extremely short of breath and sats are 78%. Once back to the room and resting again they slowly came back up to 96% on 3L of o2 over a course of five minutes.  Cyndia Bent

## 2015-08-16 NOTE — Care Management Note (Signed)
Case Management Note  Patient Details  Name: [name] MRN: [id number] Date of Birth: 1934-11-14  Subjective/Objective:       diffuse pulmonary infiltrates, CHF              Action/Plan: Discharge Planning: AVS reviewed:  NCM spoke to pt and husband at bedside. Provided pt with [facility] list/offered choice for HH. Pt requested [facility] for HH. Pt states she will be staying with dtr, [name] # [phone] for a few days. Contacted [facility] Liaison for [facility] and DME rep for oxygen and nebulizer machine for home. Explained to [facility] rep that pt will dc to her dtr's home for a few days. Pt states she has RW. Provided pt with [place] brochure to review. Faxed Rx to [facility].    Expected Discharge Date: 08/16/2015             Expected Discharge Plan:  [place]  In-House Referral:  NA  Discharge planning Services  CM Consult  Post Acute Care Choice:  Home Health Choice offered to:  Patient  DME Arranged:  Oxygen, Nebulizer machine DME Agency:  [place]:  PT, RN, Nurse's Aide [place] Agency:  [place]  Status of Service:  Completed, signed off  Medicare Important Message Given:  Yes Date Medicare IM Given:    Medicare IM give by:    Date Additional Medicare IM Given:    Additional Medicare Important Message give by:     If discussed at [place] of Stay Meetings, dates discussed:    Additional Comments:  Erenest Rasher, RN 08/16/2015, 3:38 PM

## 2015-08-16 NOTE — Discharge Instructions (Signed)

## 2015-08-16 NOTE — Discharge Summary (Addendum)
Physician Discharge Summary  [name] [id number] DOB: June 28, 1934 DOA: 08/01/2015  PCP: Sherrie Mustache, MD  Admit date: 08/01/2015 Discharge date: 08/16/2015  Recommendations for Outpatient Follow-up:  1. Pt will need to follow up with PCP in 1-2 weeks post discharge 2. Please obtain BMP to evaluate electrolytes and kidney function 3. Please also check CBC to evaluate Hg and Hct levels 4. Pt has an appointment scheduled with pulmonologist, please see below the details 5. Pt discharged on Prednisone to continue prolonged taper and will be guided on further tapering once seen by pulmonologist 6. Pt also made aware she needs to follow up with nephrologist  7. May question left on discharge to consider potential lung biopsy if respiratory status not better on follow up    Discharge Diagnoses:  Principal Problem:   Hypoxia Active Problems:   Coronary atherosclerosis   AAA (abdominal aortic aneurysm) ([place])  Discharge Condition: Stable  Diet recommendation: Heart healthy diet discussed in details   Brief Narrative:  81 year old Caucasian female with known coronary artery disease, hypertension, hyperlipidemia, hypothyroidism, anemia, presented with shortness of breath. Patient was initially started on IV antibiotics but rather slow to improve. Her creatinine worsened. Chest x-ray showed persistent infiltrates. She was given high dose Lasix. Nephrology was consulted. Patient however did not improve as anticipated. Cardiology was subsequently consulted.  Assessment & Plan:  AKI on chronic kidney disease stage V - baseline creatinine 3.6-3.9 - appreciate nephrology team assistance  - mild improvement in Cr, no significant change in GFR though  - nephrology team cleared for discharge with close outpatient follow up   Acute on chronic diastolic and systolic CHF - resolved  - reduced EF of 45-50%, RHC unremarkable  - last ECHO in 2012 with evidence of diastolic CHF grade I -  no signs of volume overload on exam  - appreciate cardiology team following, they signed off as no further cardiac eval or intervention needed   Acute respiratory distress with hypoxia  - appears to be pulmonary in etiology, acute CHF resolved, viral panel only positive for rhinovirus  - still oxygen dependent, will continue to try to taper off oxygen  - CT chest with diffuse ground glass opacities bilaterally with broad differential including sarcoidosis, PNA, malignancy  - PCCM consulted for assistance, placed on empiric solumedrol, completed 7 days of solumedrol and transition to oral Prednisone with prolonged tapering  - completed 8 days of ABX, ABX have been discontinued  Hypokalemia - WNL this AM   Hypothyroidism - continue synthroid   HLD - continue statin   Thrombocytosis - reactive, improving   Anemia of iron deficiency, chronic disease  - iron saturation 5% with ferritin 52, status post 2 units PRBC. Status post intravenous iron - aranesp started per nephrology   DVT prophylaxis: Heparin SQ  Code Status: Full   Family Communication: Patient and husband at bedside.   Disposition Plan: home  Consultants:   Cardiology - signed off on 5/25   PCCM consulted 5/25   Nephrology  Procedures:   Right heart cath 5/24 --> see below  Findings: RA = 10 RV = 37/10 PA = 41/10 (22) PCW = 8 Fick cardiac output/index = 4.8/2.8 PVR = 2.9 WU FA sat = 89% on 3L PA sat = 47%, 51%  Assessment: 1. Mild PAH (likely WHO Group 3) 2. Normal PCWP and cardiac output 3. Resting hypoxemia 4. Suspect dyspnea and mild PAH due to intrinsic lung disease. Left side pressures are normal. PVR normal.  5.  No role for selective pulmonary vasodilators.   Antimicrobials:   Zithromax 5/20 --> 5/28  Vantin 5/20 --> 5/28  Discharge Exam: Filed Vitals:   08/16/15 0508 08/16/15 0943  BP: 160/83 143/62  Pulse: 76 77  Temp: 98.1 F (36.7 C)   Resp: 18    Filed Vitals:    08/16/15 1120 08/16/15 1125 08/16/15 1130 08/16/15 1135  BP:      Pulse:      Temp:      TempSrc:      Resp:      Weight:      SpO2: 90% 95% 83% 93%    General: Pt is alert, follows commands appropriately, not in acute distress Cardiovascular: Regular rate and rhythm, S1/S2 +, no murmurs, no rubs, no gallops Respiratory: Clear to auscultation bilaterally, diminished breath sounds at bases  Abdominal: Soft, non tender, non distended, bowel sounds +, no guarding Extremities: no edema, no cyanosis, pulses palpable bilaterally DP and PT Neuro: Grossly nonfocal  Discharge Instructions  Discharge Instructions    Diet - low sodium heart healthy    Complete by:  As directed      Increase activity slowly    Complete by:  As directed             Medication List    STOP taking these medications        hydrocortisone 25 MG suppository  Commonly known as:  ANUSOL-HC      TAKE these medications        acetaminophen 325 MG tablet  Commonly known as:  TYLENOL  Take 2 tablets (650 mg total) by mouth every 4 (four) hours as needed for moderate pain or fever (fever greater than 101).     acetaminophen 325 MG tablet  Commonly known as:  TYLENOL  Take 162.5-325 mg by mouth every 6 (six) hours as needed (For pain.).     arformoterol 15 MCG/2ML Nebu  Commonly known as:  BROVANA  Take 2 mLs (15 mcg total) by nebulization 2 (two) times daily.     arformoterol 15 MCG/2ML Nebu  Commonly known as:  BROVANA  Take 2 mLs (15 mcg total) by nebulization 2 (two) times daily.     aspirin EC 81 MG tablet  Take 81 mg by mouth daily.     budesonide 0.5 MG/2ML nebulizer solution  Commonly known as:  PULMICORT  Take 2 mLs (0.5 mg total) by nebulization 2 (two) times daily.     calcitRIOL 0.25 MCG capsule  Commonly known as:  ROCALTROL  Take 1 capsule (0.25 mcg total) by mouth daily.     citalopram 20 MG tablet  Commonly known as:  CELEXA  Take 20 mg by mouth daily.     cyanocobalamin  1000 MCG/ML injection  Commonly known as:  (VITAMIN B-12)  Inject 1,000 mcg into the muscle every 30 (thirty) days.     fluticasone 50 MCG/ACT nasal spray  Commonly known as:  FLONASE  Place 1 spray into both nostrils daily as needed for allergies.     furosemide 80 MG tablet  Commonly known as:  LASIX  Take 1 tablet (80 mg total) by mouth daily. *MUST MAKE APPOINTMENT*     HYDROcodone-acetaminophen 5-325 MG tablet  Commonly known as:  NORCO/VICODIN  Take 1 tablet by mouth every 6 (six) hours as needed for moderate pain.     ipratropium-albuterol 0.5-2.5 (3) MG/3ML Soln  Commonly known as:  DUONEB  Take 3 mLs by nebulization 4 (four)  times daily - after meals and at bedtime.     levothyroxine 50 MCG tablet  Commonly known as:  SYNTHROID, LEVOTHROID  Take 50 mcg by mouth daily.     linaclotide 145 MCG Caps capsule  Commonly known as:  LINZESS  Take 1 capsule (145 mcg total) by mouth daily.     meclizine 25 MG tablet  Commonly known as:  ANTIVERT  Take 1 tablet (25 mg total) by mouth 3 (three) times daily as needed for dizziness.     NASAL DECONGESTANT SPRAY NA  Place 1 spray into the nose daily as needed (For congestion.).     NIFEdipine 60 MG 24 hr tablet  Commonly known as:  PROCARDIA XL/ADALAT-CC  TAKE 1 TABLET (60 MG TOTAL) BY MOUTH DAILY. PATIENT NEEDS TO CONTACT OFFICE FOR ADDITIONAL REFILLS     nitroGLYCERIN 0.4 MG SL tablet  Commonly known as:  NITROSTAT  Place 0.4 mg under the tongue every 5 (five) minutes as needed for chest pain.     OCUVITE PRESERVISION PO  Take 1 tablet by mouth daily.     omeprazole 20 MG capsule  Commonly known as:  PRILOSEC  Take 20 mg by mouth daily as needed (For heartburn or acid reflux.).     pravastatin 80 MG tablet  Commonly known as:  PRAVACHOL  Take 80 mg by mouth at bedtime.     predniSONE 20 MG tablet  Commonly known as:  DELTASONE  Take 40 mg tablet for 5 days, after that continue with 30 mg tablet once daily for 5  days, followed by 20 mg tablet daily for 5 days, 10 mg tablet for 5 days and ensure follow up with pulmonologist     saccharomyces boulardii 250 MG capsule  Commonly known as:  FLORASTOR  Take 1 capsule (250 mg total) by mouth 2 (two) times daily.     traMADol 50 MG tablet  Commonly known as:  ULTRAM  Take 1 tablet (50 mg total) by mouth every 6 (six) hours as needed.     traZODone 50 MG tablet  Commonly known as:  DESYREL  Take 0.5 tablets (25 mg total) by mouth at bedtime as needed for sleep.            Follow-up Information    Follow up with Sherrie Mustache, MD.   Specialty:  Family Medicine   Contact information:   [address] [place] [address] [phone]       Call Faye Ramsay, MD.   Specialty:  Internal Medicine   Why:  As needed call my cell [phone]   Contact information:   [address] [place] [address] [phone]        The results of significant diagnostics from this hospitalization (including imaging, microbiology, ancillary and laboratory) are listed below for reference.     Microbiology: Recent Results (from the past 240 hour(s))  Culture, Urine     Status: Abnormal   Collection Time: 08/08/15  5:50 AM  Result Value Ref Range Status   Specimen Description URINE, RANDOM  Final   Special Requests cefpodoxime, azithromycin  Final   Culture MULTIPLE SPECIES PRESENT, SUGGEST RECOLLECTION (A)  Final   Report Status 08/09/2015 FINAL  Final  Respiratory Panel by PCR     Status: Abnormal   Collection Time: 08/09/15 11:50 PM  Result Value Ref Range Status   Adenovirus NOT DETECTED NOT DETECTED Final   Coronavirus 229E NOT DETECTED NOT DETECTED Final  Coronavirus HKU1 NOT DETECTED NOT DETECTED Final   Coronavirus NL63 NOT DETECTED NOT DETECTED Final   Coronavirus OC43 NOT DETECTED NOT DETECTED Final   Metapneumovirus NOT DETECTED NOT DETECTED Final   Rhinovirus / Enterovirus DETECTED (A) NOT  DETECTED Final   Influenza A NOT DETECTED NOT DETECTED Final   Influenza A H1 NOT DETECTED NOT DETECTED Final   Influenza A H1 2009 NOT DETECTED NOT DETECTED Final   Influenza A H3 NOT DETECTED NOT DETECTED Final   Influenza B NOT DETECTED NOT DETECTED Final   Parainfluenza Virus 1 NOT DETECTED NOT DETECTED Final   Parainfluenza Virus 2 NOT DETECTED NOT DETECTED Final   Parainfluenza Virus 3 NOT DETECTED NOT DETECTED Final   Parainfluenza Virus 4 NOT DETECTED NOT DETECTED Final   Respiratory Syncytial Virus NOT DETECTED NOT DETECTED Final   Bordetella pertussis NOT DETECTED NOT DETECTED Final   Chlamydophila pneumoniae NOT DETECTED NOT DETECTED Final   Mycoplasma pneumoniae NOT DETECTED NOT DETECTED Final     Labs: Basic Metabolic Panel:  Recent Labs Lab 08/10/15 0634  08/12/15 0212 08/13/15 0201 08/14/15 0331 08/15/15 0248 08/16/15 0320  NA 136  < > 135 138 140 137 139  K 4.1  < > 4.2 4.1 4.4 4.3 4.2  CL 99*  < > 97* 99* 104 103 107  CO2 24  < > 21* 25 22 21* 22  GLUCOSE 97  < > 107* 107* 107* 183* 119*  BUN 65*  < > 89* 87* 83* 77* 68*  CREATININE 6.04*  < > 6.38* 6.21* 5.66* 5.40* 5.03*  CALCIUM 7.8*  < > 7.7* 7.5* 8.0* 7.8* 8.1*  PHOS 5.9*  --  6.5* 6.1* 6.1* 5.6*  --   < > = values in this interval not displayed. Liver Function Tests:  Recent Labs Lab 08/10/15 0634 08/12/15 0212 08/13/15 0201 08/14/15 0331 08/15/15 0248  ALBUMIN 2.3* 2.5* 2.5* 2.6* 2.6*   CBC:  Recent Labs Lab 08/10/15 0634  08/12/15 0212 08/13/15 0201 08/14/15 0331 08/15/15 0248 08/16/15 0320  WBC 11.5*  < > 12.8* 11.5* 12.2* 12.8* 13.6*  NEUTROABS 9.6*  --   --   --   --   --   --   HGB 8.1*  < > 8.6* 8.7* 9.3* 9.1* 9.0*  HCT 26.1*  < > 27.8* 28.6* 30.7* 30.1* 29.5*  MCV 92.2  < > 93.6 93.5 94.2 94.1 93.9  PLT 397  < > 452* 496* 478* 431* 409*  < > = values in this interval not displayed.  BNP (last 3 results)  Recent Labs  08/01/15 1458  BNP 3396.8*   CBG:  Recent  Labs Lab 08/15/15 1130 08/15/15 1631 08/15/15 2053 08/16/15 0609 08/16/15 1133  GLUCAP 227* 109* 127* 103* 108*   SIGNED: Time coordinating discharge: 30 minutes  MAGICK-Daquisha Clermont, MD  [facility] 08/16/2015, 11:52 AM Pager [phone]  If 7PM-7AM, please contact night-coverage [email or web address] Password [email or web address]

## 2015-08-20 ENCOUNTER — Emergency Department (HOSPITAL_COMMUNITY): Payer: Medicare HMO

## 2015-08-20 ENCOUNTER — Encounter (HOSPITAL_COMMUNITY): Payer: Self-pay

## 2015-08-20 ENCOUNTER — Inpatient Hospital Stay (HOSPITAL_COMMUNITY)
Admission: EM | Admit: 2015-08-20 | Discharge: 2015-09-15 | DRG: 673 | Disposition: E | Payer: Medicare HMO | Attending: Internal Medicine | Admitting: Internal Medicine

## 2015-08-20 DIAGNOSIS — Z7951 Long term (current) use of inhaled steroids: Secondary | ICD-10-CM | POA: Diagnosis not present

## 2015-08-20 DIAGNOSIS — Z931 Gastrostomy status: Secondary | ICD-10-CM

## 2015-08-20 DIAGNOSIS — D631 Anemia in chronic kidney disease: Secondary | ICD-10-CM | POA: Diagnosis present

## 2015-08-20 DIAGNOSIS — Z09 Encounter for follow-up examination after completed treatment for conditions other than malignant neoplasm: Secondary | ICD-10-CM

## 2015-08-20 DIAGNOSIS — I2510 Atherosclerotic heart disease of native coronary artery without angina pectoris: Secondary | ICD-10-CM | POA: Diagnosis present

## 2015-08-20 DIAGNOSIS — E785 Hyperlipidemia, unspecified: Secondary | ICD-10-CM | POA: Diagnosis present

## 2015-08-20 DIAGNOSIS — E43 Unspecified severe protein-calorie malnutrition: Secondary | ICD-10-CM | POA: Diagnosis present

## 2015-08-20 DIAGNOSIS — Z978 Presence of other specified devices: Secondary | ICD-10-CM

## 2015-08-20 DIAGNOSIS — J9600 Acute respiratory failure, unspecified whether with hypoxia or hypercapnia: Secondary | ICD-10-CM

## 2015-08-20 DIAGNOSIS — J9690 Respiratory failure, unspecified, unspecified whether with hypoxia or hypercapnia: Secondary | ICD-10-CM

## 2015-08-20 DIAGNOSIS — Z79899 Other long term (current) drug therapy: Secondary | ICD-10-CM

## 2015-08-20 DIAGNOSIS — Z885 Allergy status to narcotic agent status: Secondary | ICD-10-CM

## 2015-08-20 DIAGNOSIS — Z8679 Personal history of other diseases of the circulatory system: Secondary | ICD-10-CM

## 2015-08-20 DIAGNOSIS — Z9841 Cataract extraction status, right eye: Secondary | ICD-10-CM | POA: Diagnosis not present

## 2015-08-20 DIAGNOSIS — E8889 Other specified metabolic disorders: Secondary | ICD-10-CM | POA: Diagnosis present

## 2015-08-20 DIAGNOSIS — R0902 Hypoxemia: Secondary | ICD-10-CM | POA: Diagnosis not present

## 2015-08-20 DIAGNOSIS — N2581 Secondary hyperparathyroidism of renal origin: Secondary | ICD-10-CM | POA: Diagnosis present

## 2015-08-20 DIAGNOSIS — I132 Hypertensive heart and chronic kidney disease with heart failure and with stage 5 chronic kidney disease, or end stage renal disease: Secondary | ICD-10-CM | POA: Diagnosis present

## 2015-08-20 DIAGNOSIS — Z87891 Personal history of nicotine dependence: Secondary | ICD-10-CM | POA: Diagnosis not present

## 2015-08-20 DIAGNOSIS — J9601 Acute respiratory failure with hypoxia: Secondary | ICD-10-CM | POA: Diagnosis not present

## 2015-08-20 DIAGNOSIS — E872 Acidosis: Secondary | ICD-10-CM | POA: Diagnosis present

## 2015-08-20 DIAGNOSIS — J9620 Acute and chronic respiratory failure, unspecified whether with hypoxia or hypercapnia: Secondary | ICD-10-CM | POA: Diagnosis not present

## 2015-08-20 DIAGNOSIS — Z7952 Long term (current) use of systemic steroids: Secondary | ICD-10-CM | POA: Diagnosis not present

## 2015-08-20 DIAGNOSIS — J9621 Acute and chronic respiratory failure with hypoxia: Secondary | ICD-10-CM | POA: Diagnosis not present

## 2015-08-20 DIAGNOSIS — Z515 Encounter for palliative care: Secondary | ICD-10-CM | POA: Diagnosis present

## 2015-08-20 DIAGNOSIS — T82898A Other specified complication of vascular prosthetic devices, implants and grafts, initial encounter: Secondary | ICD-10-CM | POA: Diagnosis present

## 2015-08-20 DIAGNOSIS — E871 Hypo-osmolality and hyponatremia: Secondary | ICD-10-CM | POA: Diagnosis not present

## 2015-08-20 DIAGNOSIS — E039 Hypothyroidism, unspecified: Secondary | ICD-10-CM | POA: Diagnosis present

## 2015-08-20 DIAGNOSIS — I5033 Acute on chronic diastolic (congestive) heart failure: Secondary | ICD-10-CM

## 2015-08-20 DIAGNOSIS — Z882 Allergy status to sulfonamides status: Secondary | ICD-10-CM | POA: Diagnosis not present

## 2015-08-20 DIAGNOSIS — Z90710 Acquired absence of both cervix and uterus: Secondary | ICD-10-CM | POA: Diagnosis not present

## 2015-08-20 DIAGNOSIS — I252 Old myocardial infarction: Secondary | ICD-10-CM

## 2015-08-20 DIAGNOSIS — N184 Chronic kidney disease, stage 4 (severe): Secondary | ICD-10-CM | POA: Diagnosis not present

## 2015-08-20 DIAGNOSIS — R579 Shock, unspecified: Secondary | ICD-10-CM | POA: Diagnosis not present

## 2015-08-20 DIAGNOSIS — J80 Acute respiratory distress syndrome: Secondary | ICD-10-CM | POA: Diagnosis not present

## 2015-08-20 DIAGNOSIS — Z9049 Acquired absence of other specified parts of digestive tract: Secondary | ICD-10-CM | POA: Diagnosis not present

## 2015-08-20 DIAGNOSIS — J939 Pneumothorax, unspecified: Secondary | ICD-10-CM | POA: Insufficient documentation

## 2015-08-20 DIAGNOSIS — I714 Abdominal aortic aneurysm, without rupture: Secondary | ICD-10-CM | POA: Diagnosis present

## 2015-08-20 DIAGNOSIS — N186 End stage renal disease: Secondary | ICD-10-CM | POA: Diagnosis present

## 2015-08-20 DIAGNOSIS — R0600 Dyspnea, unspecified: Secondary | ICD-10-CM | POA: Diagnosis not present

## 2015-08-20 DIAGNOSIS — Z992 Dependence on renal dialysis: Secondary | ICD-10-CM | POA: Diagnosis not present

## 2015-08-20 DIAGNOSIS — Z7982 Long term (current) use of aspirin: Secondary | ICD-10-CM

## 2015-08-20 DIAGNOSIS — J189 Pneumonia, unspecified organism: Secondary | ICD-10-CM | POA: Diagnosis present

## 2015-08-20 DIAGNOSIS — R0602 Shortness of breath: Secondary | ICD-10-CM | POA: Diagnosis present

## 2015-08-20 DIAGNOSIS — Z9689 Presence of other specified functional implants: Secondary | ICD-10-CM

## 2015-08-20 DIAGNOSIS — L988 Other specified disorders of the skin and subcutaneous tissue: Secondary | ICD-10-CM

## 2015-08-20 DIAGNOSIS — D649 Anemia, unspecified: Secondary | ICD-10-CM | POA: Diagnosis present

## 2015-08-20 DIAGNOSIS — J849 Interstitial pulmonary disease, unspecified: Secondary | ICD-10-CM | POA: Diagnosis not present

## 2015-08-20 DIAGNOSIS — Z452 Encounter for adjustment and management of vascular access device: Secondary | ICD-10-CM

## 2015-08-20 DIAGNOSIS — Z9842 Cataract extraction status, left eye: Secondary | ICD-10-CM

## 2015-08-20 DIAGNOSIS — Z6822 Body mass index (BMI) 22.0-22.9, adult: Secondary | ICD-10-CM | POA: Diagnosis not present

## 2015-08-20 DIAGNOSIS — Z955 Presence of coronary angioplasty implant and graft: Secondary | ICD-10-CM

## 2015-08-20 DIAGNOSIS — R0603 Acute respiratory distress: Secondary | ICD-10-CM

## 2015-08-20 DIAGNOSIS — Z9889 Other specified postprocedural states: Secondary | ICD-10-CM

## 2015-08-20 DIAGNOSIS — N179 Acute kidney failure, unspecified: Secondary | ICD-10-CM | POA: Diagnosis present

## 2015-08-20 LAB — COMPREHENSIVE METABOLIC PANEL
ALT: 22 U/L (ref 14–54)
AST: 38 U/L (ref 15–41)
Albumin: 3 g/dL — ABNORMAL LOW (ref 3.5–5.0)
Alkaline Phosphatase: 97 U/L (ref 38–126)
Anion gap: 12 (ref 5–15)
BUN: 78 mg/dL — ABNORMAL HIGH (ref 6–20)
CHLORIDE: 106 mmol/L (ref 101–111)
CO2: 20 mmol/L — AB (ref 22–32)
Calcium: 7.9 mg/dL — ABNORMAL LOW (ref 8.9–10.3)
Creatinine, Ser: 5.3 mg/dL — ABNORMAL HIGH (ref 0.44–1.00)
GFR calc non Af Amer: 7 mL/min — ABNORMAL LOW (ref >60)
GFR, EST AFRICAN AMERICAN: 8 mL/min — AB (ref >60)
Glucose, Bld: 172 mg/dL — ABNORMAL HIGH (ref 65–99)
Potassium: 5.4 mmol/L — ABNORMAL HIGH (ref 3.5–5.1)
SODIUM: 138 mmol/L (ref 135–145)
Total Bilirubin: 0.6 mg/dL (ref 0.3–1.2)
Total Protein: 7 g/dL (ref 6.5–8.1)

## 2015-08-20 LAB — BLOOD GAS, ARTERIAL
Acid-base deficit: 4.6 mmol/L — ABNORMAL HIGH (ref 0.0–2.0)
Bicarbonate: 20.9 mEq/L (ref 20.0–24.0)
DELIVERY SYSTEMS: POSITIVE
DRAWN BY: 277331
Expiratory PAP: 8
FIO2: 0.6
INSPIRATORY PAP: 10
LHR: 10 {breaths}/min
O2 Saturation: 97.7 %
PCO2 ART: 33.1 mmHg — AB (ref 35.0–45.0)
Patient temperature: 37
pH, Arterial: 7.388 (ref 7.350–7.450)
pO2, Arterial: 113 mmHg — ABNORMAL HIGH (ref 80.0–100.0)

## 2015-08-20 LAB — CBC
HCT: 33 % — ABNORMAL LOW (ref 36.0–46.0)
Hemoglobin: 10.3 g/dL — ABNORMAL LOW (ref 12.0–15.0)
MCH: 30.1 pg (ref 26.0–34.0)
MCHC: 31.2 g/dL (ref 30.0–36.0)
MCV: 96.5 fL (ref 78.0–100.0)
PLATELETS: 338 10*3/uL (ref 150–400)
RBC: 3.42 MIL/uL — AB (ref 3.87–5.11)
RDW: 17.6 % — ABNORMAL HIGH (ref 11.5–15.5)
WBC: 20.6 10*3/uL — ABNORMAL HIGH (ref 4.0–10.5)

## 2015-08-20 LAB — TROPONIN I: TROPONIN I: 0.07 ng/mL — AB (ref <0.031)

## 2015-08-20 LAB — MRSA PCR SCREENING: MRSA BY PCR: NEGATIVE

## 2015-08-20 LAB — BRAIN NATRIURETIC PEPTIDE: B NATRIURETIC PEPTIDE 5: 1279 pg/mL — AB (ref 0.0–100.0)

## 2015-08-20 LAB — APTT: APTT: 24 s (ref 24–37)

## 2015-08-20 LAB — PROTIME-INR
INR: 1.35 (ref 0.00–1.49)
Prothrombin Time: 16.8 seconds — ABNORMAL HIGH (ref 11.6–15.2)

## 2015-08-20 MED ORDER — ACETAMINOPHEN 325 MG PO TABS
650.0000 mg | ORAL_TABLET | Freq: Four times a day (QID) | ORAL | Status: DC | PRN
  Administered 2015-08-27: 650 mg via ORAL
  Filled 2015-08-20 (×2): qty 2

## 2015-08-20 MED ORDER — PANTOPRAZOLE SODIUM 40 MG PO TBEC
40.0000 mg | DELAYED_RELEASE_TABLET | Freq: Every day | ORAL | Status: DC
  Administered 2015-08-21 – 2015-09-02 (×12): 40 mg via ORAL
  Filled 2015-08-20 (×12): qty 1

## 2015-08-20 MED ORDER — FUROSEMIDE 10 MG/ML IJ SOLN: 40.0000 mg | INTRAMUSCULAR | Status: DC

## 2015-08-20 MED ORDER — MECLIZINE HCL 12.5 MG PO TABS: 25.0000 mg | ORAL_TABLET | Freq: Three times a day (TID) | ORAL | Status: DC | PRN

## 2015-08-20 MED ORDER — SODIUM CHLORIDE 0.9% FLUSH
3.0000 mL | Freq: Two times a day (BID) | INTRAVENOUS | Status: DC
  Administered 2015-08-20 – 2015-08-30 (×13): 3 mL via INTRAVENOUS

## 2015-08-20 MED ORDER — ACETAMINOPHEN 650 MG RE SUPP: 650.0000 mg | Freq: Four times a day (QID) | RECTAL | Status: DC | PRN

## 2015-08-20 MED ORDER — SENNOSIDES-DOCUSATE SODIUM 8.6-50 MG PO TABS
1.0000 | ORAL_TABLET | Freq: Every day | ORAL | Status: DC
  Administered 2015-08-21 – 2015-09-05 (×13): 1 via ORAL
  Filled 2015-08-20 (×13): qty 1

## 2015-08-20 MED ORDER — ALBUTEROL SULFATE (2.5 MG/3ML) 0.083% IN NEBU
5.0000 mg | INHALATION_SOLUTION | Freq: Once | RESPIRATORY_TRACT | Status: AC
  Administered 2015-08-20: 5 mg via RESPIRATORY_TRACT
  Filled 2015-08-20: qty 6

## 2015-08-20 MED ORDER — SODIUM CHLORIDE 0.9% FLUSH: 3.0000 mL | INTRAVENOUS | Status: DC | PRN

## 2015-08-20 MED ORDER — HEPARIN SODIUM (PORCINE) 5000 UNIT/ML IJ SOLN
5000.0000 [IU] | Freq: Three times a day (TID) | INTRAMUSCULAR | Status: DC
  Administered 2015-08-20 – 2015-09-05 (×41): 5000 [IU] via SUBCUTANEOUS
  Filled 2015-08-20 (×42): qty 1

## 2015-08-20 MED ORDER — NITROGLYCERIN IN D5W 200-5 MCG/ML-% IV SOLN
5.0000 ug/min | Freq: Once | INTRAVENOUS | Status: DC
Start: 2015-08-20 — End: 2015-08-20

## 2015-08-20 MED ORDER — NIFEDIPINE ER OSMOTIC RELEASE 30 MG PO TB24
60.0000 mg | ORAL_TABLET | Freq: Every day | ORAL | Status: DC
  Administered 2015-08-21 – 2015-08-22 (×2): 60 mg via ORAL
  Filled 2015-08-20 (×2): qty 2

## 2015-08-20 MED ORDER — ZOLPIDEM TARTRATE 5 MG PO TABS
5.0000 mg | ORAL_TABLET | Freq: Every evening | ORAL | Status: DC | PRN
  Administered 2015-08-21 – 2015-08-30 (×11): 5 mg via ORAL
  Filled 2015-08-20 (×11): qty 1

## 2015-08-20 MED ORDER — LEVOFLOXACIN IN D5W 500 MG/100ML IV SOLN
500.0000 mg | INTRAVENOUS | Status: DC
  Administered 2015-08-20: 500 mg via INTRAVENOUS
  Filled 2015-08-20: qty 100

## 2015-08-20 MED ORDER — METHYLPREDNISOLONE SODIUM SUCC 125 MG IJ SOLR
80.0000 mg | Freq: Three times a day (TID) | INTRAMUSCULAR | Status: DC
  Administered 2015-08-20 – 2015-08-21 (×2): 80 mg via INTRAVENOUS
  Filled 2015-08-20 (×2): qty 2

## 2015-08-20 MED ORDER — ALBUTEROL SULFATE (2.5 MG/3ML) 0.083% IN NEBU: 2.5000 mg | INHALATION_SOLUTION | RESPIRATORY_TRACT | Status: DC | PRN

## 2015-08-20 MED ORDER — SODIUM CHLORIDE 0.9 % IV SOLN
250.0000 mL | INTRAVENOUS | Status: DC | PRN
  Administered 2015-08-22: 250 mL via INTRAVENOUS
  Administered 2015-08-31: 09:00:00 via INTRAVENOUS

## 2015-08-20 MED ORDER — ONDANSETRON HCL 4 MG/2ML IJ SOLN: 4.0000 mg | Freq: Three times a day (TID) | INTRAMUSCULAR | Status: AC | PRN

## 2015-08-20 MED ORDER — FUROSEMIDE 10 MG/ML IJ SOLN: 40.0000 mg | Freq: Two times a day (BID) | INTRAMUSCULAR | Status: DC

## 2015-08-20 MED ORDER — FLUTICASONE PROPIONATE 50 MCG/ACT NA SUSP
2.0000 | Freq: Every day | NASAL | Status: DC
Start: 2015-08-20 — End: 2015-09-03
  Administered 2015-08-21 – 2015-09-02 (×12): 2 via NASAL
  Filled 2015-08-20: qty 32
  Filled 2015-08-20 (×3): qty 16

## 2015-08-20 MED ORDER — METHYLPREDNISOLONE SODIUM SUCC 125 MG IJ SOLR
125.0000 mg | Freq: Once | INTRAMUSCULAR | Status: AC
  Administered 2015-08-20: 125 mg via INTRAVENOUS
  Filled 2015-08-20: qty 2

## 2015-08-20 MED ORDER — CYANOCOBALAMIN 1000 MCG/ML IJ SOLN
1000.0000 ug | INTRAMUSCULAR | Status: DC
  Filled 2015-08-20: qty 1

## 2015-08-20 MED ORDER — LEVOTHYROXINE SODIUM 50 MCG PO TABS
50.0000 ug | ORAL_TABLET | Freq: Every day | ORAL | Status: DC
  Administered 2015-08-21 – 2015-09-05 (×15): 50 ug via ORAL
  Filled 2015-08-20 (×4): qty 1
  Filled 2015-08-20: qty 2
  Filled 2015-08-20: qty 1
  Filled 2015-08-20: qty 2
  Filled 2015-08-20 (×5): qty 1
  Filled 2015-08-20: qty 2
  Filled 2015-08-20 (×2): qty 1

## 2015-08-20 MED ORDER — PRAVASTATIN SODIUM 40 MG PO TABS
80.0000 mg | ORAL_TABLET | Freq: Every day | ORAL | Status: DC
  Administered 2015-08-20 – 2015-09-02 (×13): 80 mg via ORAL
  Filled 2015-08-20 (×13): qty 2

## 2015-08-20 MED ORDER — ASPIRIN EC 81 MG PO TBEC
81.0000 mg | DELAYED_RELEASE_TABLET | Freq: Every day | ORAL | Status: DC
  Administered 2015-08-21 – 2015-09-04 (×13): 81 mg via ORAL
  Filled 2015-08-20 (×13): qty 1

## 2015-08-20 MED ORDER — SODIUM CHLORIDE 0.9% FLUSH
3.0000 mL | Freq: Two times a day (BID) | INTRAVENOUS | Status: DC
  Administered 2015-08-20 – 2015-08-30 (×18): 3 mL via INTRAVENOUS

## 2015-08-20 MED ORDER — ARFORMOTEROL TARTRATE 15 MCG/2ML IN NEBU
15.0000 ug | INHALATION_SOLUTION | Freq: Two times a day (BID) | RESPIRATORY_TRACT | Status: DC
  Administered 2015-08-20 – 2015-09-02 (×26): 15 ug via RESPIRATORY_TRACT
  Filled 2015-08-20 (×26): qty 2

## 2015-08-20 MED ORDER — BUDESONIDE 0.5 MG/2ML IN SUSP
0.5000 mg | Freq: Two times a day (BID) | RESPIRATORY_TRACT | Status: DC
  Administered 2015-08-20 – 2015-09-05 (×31): 0.5 mg via RESPIRATORY_TRACT
  Filled 2015-08-20 (×31): qty 2

## 2015-08-20 MED ORDER — DIPHENHYDRAMINE-APAP (SLEEP) 25-500 MG PO TABS: 1.0000 | ORAL_TABLET | Freq: Every evening | ORAL | Status: DC | PRN

## 2015-08-20 MED ORDER — NITROGLYCERIN 0.4 MG SL SUBL: 0.4000 mg | SUBLINGUAL_TABLET | SUBLINGUAL | Status: DC | PRN

## 2015-08-20 MED ORDER — CITALOPRAM HYDROBROMIDE 20 MG PO TABS
20.0000 mg | ORAL_TABLET | Freq: Every day | ORAL | Status: DC
  Administered 2015-08-21 – 2015-09-05 (×14): 20 mg via ORAL
  Filled 2015-08-20 (×14): qty 1

## 2015-08-20 MED ORDER — FUROSEMIDE 80 MG PO TABS: 80.0000 mg | ORAL_TABLET | Freq: Every day | ORAL | Status: DC

## 2015-08-20 MED ORDER — ASPIRIN 81 MG PO CHEW
324.0000 mg | CHEWABLE_TABLET | Freq: Once | ORAL | Status: AC
  Administered 2015-08-20: 324 mg via ORAL
  Filled 2015-08-20: qty 4

## 2015-08-20 MED ORDER — BISACODYL 5 MG PO TBEC: 5.0000 mg | DELAYED_RELEASE_TABLET | Freq: Every day | ORAL | Status: DC | PRN

## 2015-08-20 MED ORDER — IPRATROPIUM-ALBUTEROL 0.5-2.5 (3) MG/3ML IN SOLN: 3.0000 mL | Freq: Three times a day (TID) | RESPIRATORY_TRACT | Status: DC

## 2015-08-20 MED ORDER — SODIUM POLYSTYRENE SULFONATE 15 GM/60ML PO SUSP
30.0000 g | Freq: Once | ORAL | Status: AC
  Administered 2015-08-20: 30 g via ORAL
  Filled 2015-08-20: qty 120

## 2015-08-20 MED ORDER — IPRATROPIUM-ALBUTEROL 0.5-2.5 (3) MG/3ML IN SOLN
3.0000 mL | Freq: Four times a day (QID) | RESPIRATORY_TRACT | Status: DC
  Administered 2015-08-20 – 2015-08-23 (×10): 3 mL via RESPIRATORY_TRACT
  Filled 2015-08-20 (×9): qty 3

## 2015-08-20 MED ORDER — CALCITRIOL 0.25 MCG PO CAPS
0.2500 ug | ORAL_CAPSULE | Freq: Every day | ORAL | Status: DC
  Administered 2015-08-21 – 2015-09-04 (×13): 0.25 ug via ORAL
  Filled 2015-08-20 (×14): qty 1

## 2015-08-20 NOTE — ED Provider Notes (Signed)
CSN: [id number]     Arrival date & time 09/06/2015  1257 History   First MD Initiated Contact with Patient 09/10/2015 1308     Chief Complaint  Patient presents with  . Shortness of Breath     (Consider location/radiation/quality/duration/timing/severity/associated sxs/prior Treatment) HPI  The patient is a 81 year old female, recently diagnosed with Interstitial lung disease with pulmonary infiltrates that were not responsive to diuresis or to antibiotics., known history of coronary disease status post stenting.  She had a normal EF on RHC last week as well as some PAH.  The pt states that she was at her baseline yetserday doing well but noted that she became severely dyspneic today and was found with sat's in the 60% range when EMS found her and placed her on bipap with good improvement - she denies fevers, coughing or sweling of the legs.  Sx are severe, persistent and were acute in onset this AM.  Of note at last d/c the pt was needing prednisone daily per pulmonary consult and was responding well to that.  Past Medical History  Diagnosis Date  . CAD (coronary artery disease)     50% proximal LAD stenosis, 50% marginal stenosis of the circumflex, RCA 95% stenosis, EF 40% stenosis with hypokinesis of the interior wall.  She had a Taxus stent placed in 04/16/2006  . Hypertension   . Abdominal aortic aneurysm ([place])   . Renal artery stenosis ([facility])     S/P stenting of the right renal atrophic left renal  . Dyslipidemia   . Hypothyroidism   . Tobacco user     Previous  . Adenomatous polyp 2004, 2011  . Hyperplastic colon polyp 2011  . Hemorrhoids   . Diverticulosis   . Renal insufficiency   . Anemia   . Myocardial infarction ([place])     stent 2008  . Constipation   . Cataract   . AAA (abdominal aortic aneurysm) [facility]    Past Surgical History  Procedure Laterality Date  . Abdominal hysterectomy    . Carpal tunnel release Bilateral   . Cholecystectomy    . Flexible sigmoidoscopy   02/25/2012    Procedure: FLEXIBLE SIGMOIDOSCOPY;  Surgeon: Inda Castle, MD;  Location: [facility];  Service: Endoscopy;  Laterality: N/A;  . Hemorrhoid banding  02/25/2012    Procedure: HEMORRHOID BANDING;  Surgeon: Inda Castle, MD;  Location: [facility];  Service: Endoscopy;  Laterality: N/A;  . Abdominal angiogram  12/21/2012    AAA  . Abdominal aortic endovascular stent graft N/A 12/22/2012    Procedure: ABDOMINAL AORTIC ENDOVASCULAR STENT GRAFT- GORE;  Surgeon: Mal Misty, MD;  Location: [place];  Service: Vascular;  Laterality: N/A;  Ultrasound guided  . Abdominal angiogram N/A 11/30/2012    Procedure: ABDOMINAL ANGIOGRAM;  Surgeon: Serafina Mitchell, MD;  Location: [facility] CATH LAB;  Service: Cardiovascular;  Laterality: N/A;  . Cataract extraction, bilateral    . Colonoscopy    . Polypectomy    . Cardiac catheterization  2008  . Av fistula placement Left 04/20/2015    Procedure: CREATION OF LEFT UPPER ARM BRACHIOCEPHALIC ARTERIOVENOUS (AV) FISTULA  ;  Surgeon: Mal Misty, MD;  Location: [place];  Service: Vascular;  Laterality: Left;  . Cardiac catheterization N/A 08/08/2015    Procedure: Right Heart Cath;  Surgeon: Jolaine Artist, MD;  Location: [place] CV LAB;  Service: Cardiovascular;  Laterality: N/A;   Family History  Problem Relation Age of Onset  . Uterine cancer Mother   .  Heart attack Father 67  . Heart disease Father     before age 60  . Peripheral vascular disease Sister   . Other Sister     Renal artery stenosis  . Heart disease Sister     before age 68  . Heart attack Brother 13  . Heart disease Brother     before age 66  . Lung cancer Brother     \  . Colon cancer Neg Hx   . Breast cancer Maternal Aunt     x2   Social History  Substance Use Topics  . Smoking status: Former Smoker -- 1 years    Types: Cigarettes    Quit date: 03/17/2006  . Smokeless tobacco: Never Used  . Alcohol Use: No   OB History    No data available     Review of  Systems  All other systems reviewed and are negative.     Allergies  Codeine and Sulfonamide derivatives  Home Medications   Prior to Admission medications   Medication Sig Start Date End Date Taking? Authorizing Provider  arformoterol (BROVANA) 15 MCG/2ML NEBU Take 2 mLs (15 mcg total) by nebulization 2 (two) times daily. 08/16/15  Yes Theodis Blaze, MD  aspirin EC 81 MG tablet Take 81 mg by mouth daily.   Yes Historical Provider, MD  bisacodyl (BISACODYL) 5 MG EC tablet Take 5 mg by mouth daily as needed for moderate constipation.   Yes Historical Provider, MD  budesonide (PULMICORT) 0.5 MG/2ML nebulizer solution Take 2 mLs (0.5 mg total) by nebulization 2 (two) times daily. 08/16/15  Yes Theodis Blaze, MD  calcitRIOL (ROCALTROL) 0.25 MCG capsule Take 1 capsule (0.25 mcg total) by mouth daily. 08/16/15  Yes Theodis Blaze, MD  citalopram (CELEXA) 20 MG tablet Take 20 mg by mouth daily.  12/16/10  Yes Historical Provider, MD  cyanocobalamin (,VITAMIN B-12,) 1000 MCG/ML injection Inject 1,000 mcg into the muscle every 30 (thirty) days.    Yes Historical Provider, MD  diphenhydramine-acetaminophen (ACETAMINOPHEN PM) 25-500 MG TABS tablet Take 1 tablet by mouth at bedtime as needed.   Yes Historical Provider, MD  furosemide (LASIX) 80 MG tablet Take 1 tablet (80 mg total) by mouth daily. *MUST MAKE APPOINTMENT* 08/16/15  Yes Theodis Blaze, MD  ipratropium-albuterol (DUONEB) 0.5-2.5 (3) MG/3ML SOLN Take 3 mLs by nebulization 4 (four) times daily - after meals and at bedtime. 08/16/15  Yes Theodis Blaze, MD  levothyroxine (SYNTHROID, LEVOTHROID) 50 MCG tablet Take 50 mcg by mouth daily.    Yes Historical Provider, MD  meclizine (ANTIVERT) 25 MG tablet Take 1 tablet (25 mg total) by mouth 3 (three) times daily as needed for dizziness. 08/14/14  Yes Veryl Speak, MD  NIFEdipine (PROCARDIA XL/ADALAT-CC) 60 MG 24 hr tablet TAKE 1 TABLET (60 MG TOTAL) BY MOUTH DAILY. PATIENT NEEDS TO CONTACT OFFICE FOR ADDITIONAL  REFILLS 07/12/15  Yes Minus Breeding, MD  nitroGLYCERIN (NITROSTAT) 0.4 MG SL tablet Place 0.4 mg under the tongue every 5 (five) minutes as needed for chest pain.   Yes Historical Provider, MD  omeprazole (PRILOSEC) 20 MG capsule Take 20 mg by mouth daily as needed (For heartburn or acid reflux.).  07/13/13  Yes Historical Provider, MD  pravastatin (PRAVACHOL) 80 MG tablet Take 80 mg by mouth at bedtime.  03/29/12  Yes Historical Provider, MD  predniSONE (DELTASONE) 20 MG tablet Take 40 mg tablet for 5 days, after that continue with 30 mg tablet once daily for  5 days, followed by 20 mg tablet daily for 5 days, 10 mg tablet for 5 days and ensure follow up with pulmonologist 08/16/15  Yes Theodis Blaze, MD  senna-docusate (SENOKOT-S) 8.6-50 MG tablet Take 1 tablet by mouth daily.   Yes Historical Provider, MD  triamcinolone (NASACORT ALLERGY 24HR) 55 MCG/ACT AERO nasal inhaler Place 2 sprays into the nose daily.   Yes Historical Provider, MD  HYDROcodone-acetaminophen (NORCO/VICODIN) 5-325 MG tablet Take 1 tablet by mouth every 6 (six) hours as needed for moderate pain. Patient not taking: Reported on 08/16/2015 08/14/15   Theodis Blaze, MD  Linaclotide [facility]) 145 MCG CAPS capsule Take 1 capsule (145 mcg total) by mouth daily. Patient not taking: Reported on 09/08/2015 06/07/15   Milus Banister, MD  traMADol (ULTRAM) 50 MG tablet Take 1 tablet (50 mg total) by mouth every 6 (six) hours as needed. Patient not taking: Reported on 08/27/2015 04/20/15   Alvia Grove, PA-C  traZODone (DESYREL) 50 MG tablet Take 0.5 tablets (25 mg total) by mouth at bedtime as needed for sleep. Patient not taking: Reported on 09/01/2015 08/14/15   Theodis Blaze, MD   BP 107/73 mmHg  Pulse 84  Temp(Src) 98.7 F (37.1 C) (Rectal)  Resp 24  Ht 5\' 3"  (1.6 m)  Wt 129 lb (58.514 kg)  BMI 22.86 kg/m2  SpO2 98% Physical Exam  Constitutional: She appears well-developed and well-nourished. She appears distressed.  HENT:  Head:  Normocephalic and atraumatic.  Mouth/Throat: Oropharynx is clear and moist. No oropharyngeal exudate.  Eyes: Conjunctivae and EOM are normal. Pupils are equal, round, and reactive to light. Right eye exhibits no discharge. Left eye exhibits no discharge. No scleral icterus.  Neck: Normal range of motion. Neck supple. No JVD present. No thyromegaly present.  Cardiovascular: Normal rate, regular rhythm, normal heart sounds and intact distal pulses.  Exam reveals no gallop and no friction rub.   No murmur heard. Pulmonary/Chest: Effort normal. No respiratory distress. She has no wheezes. She has rales (diffuse rales, tachypnea, distress).  Abdominal: Soft. Bowel sounds are normal. She exhibits no distension and no mass. There is no tenderness.  Musculoskeletal: Normal range of motion. She exhibits no edema or tenderness.  No edema  Lymphadenopathy:    She has no cervical adenopathy.  Neurological: She is alert. Coordination normal.  Skin: Skin is warm and dry. No rash noted. No erythema.  Psychiatric: She has a normal mood and affect. Her behavior is normal.  Nursing note and vitals reviewed.   ED Course  Procedures (including critical care time) Labs Review Labs Reviewed  CBC - Abnormal; Notable for the following:    WBC 20.6 (*)    RBC 3.42 (*)    Hemoglobin 10.3 (*)    HCT 33.0 (*)    RDW 17.6 (*)    All other components within normal limits  COMPREHENSIVE METABOLIC PANEL - Abnormal; Notable for the following:    Potassium 5.4 (*)    CO2 20 (*)    Glucose, Bld 172 (*)    BUN 78 (*)    Creatinine, Ser 5.30 (*)    Calcium 7.9 (*)    Albumin 3.0 (*)    GFR calc non Af Amer 7 (*)    GFR calc Af Amer 8 (*)    All other components within normal limits  PROTIME-INR - Abnormal; Notable for the following:    Prothrombin Time 16.8 (*)    All other components within normal limits  TROPONIN I - Abnormal; Notable for the following:    Troponin I 0.07 (*)    All other components within  normal limits  BRAIN NATRIURETIC PEPTIDE - Abnormal; Notable for the following:    B Natriuretic Peptide 1279.0 (*)    All other components within normal limits  BLOOD GAS, ARTERIAL - Abnormal; Notable for the following:    pCO2 arterial 33.1 (*)    pO2, Arterial 113 (*)    Acid-base deficit 4.6 (*)    All other components within normal limits  APTT    Imaging Review Dg Chest Portable 1 View  09/04/2015  CLINICAL DATA:  Shortness of breath EXAM: PORTABLE CHEST 1 VIEW COMPARISON:  Aug 13, 2015 FINDINGS: No pneumothorax. Cardiomegaly persists. The hila and mediastinum are unchanged. Bilateral pulmonary opacities persist. No other interval changes. IMPRESSION: Persistent bilateral pulmonary opacities. These were better evaluated on the CT scan from Aug 08, 2015 Electronically Signed   By: Dorise Bullion III M.D   On: 08/28/2015 13:34   I have personally reviewed and evaluated these images and lab results as part of my medical decision-making.   EKG Interpretation   Date/Time:  Monday August 20 2015 13:02:44 EDT Ventricular Rate:  105 PR Interval:  174 QRS Duration: 95 QT Interval:  348 QTC Calculation: 460 R Axis:   95 Text Interpretation:  Sinus tachycardia Left atrial enlargement Right axis  deviation Borderline repolarization abnormality Since last tracing ST and  T waves are abnormal in the inferior and septal / anterior leads Abnormal  ekg Confirmed by Sabra Heck  MD, Ezri Landers ([id number]) on 08/28/2015 1:17:00 PM      MDM   Final diagnoses:  Respiratory distress  Interstitial lung disease ([place])    The pt does not appear fluid overloaded, there is no JVD and she has hx of primary lung disease - steroids given, taken off EMS bipap and had drop to 60% on RA immediately - comes back up to mid 90% range on bipap - obtain labs and CXR - will need readmission - she is amenable to intubation if needed - at this time she is not stable but not requiring intubation.  1:15 PM   The patient is  doing much better on BiPAP, she is having ongoing need an requirement for her oxygenation with positive airway pressure, she does not maintain sats without it. I compared her x-ray to prior x-rays, labs to prior labs, it appears that this is likely interstitial lung disease with severe exacerbation and unlikely to be pneumonia or pulmonary edema. She will be admitted to the hospital to the stepdown unit under the care of Dr. Maudie Mercury, I appreciate his timely consultation.  CRITICAL CARE Performed by: Johnna Acosta Total critical care time: 35 minutes Critical care time was exclusive of separately billable procedures and treating other patients. Critical care was necessary to treat or prevent imminent or life-threatening deterioration. Critical care was time spent personally by me on the following activities: development of treatment plan with patient and/or surrogate as well as nursing, discussions with consultants, evaluation of patient's response to treatment, examination of patient, obtaining history from patient or surrogate, ordering and performing treatments and interventions, ordering and review of laboratory studies, ordering and review of radiographic studies, pulse oximetry and re-evaluation of patient's condition.     Noemi Chapel, MD 09/03/2015 1550

## 2015-08-20 NOTE — ED Notes (Signed)
MD at bedside. 

## 2015-08-20 NOTE — ED Notes (Signed)
Patient was 55% on RA. Patient placed on B-PAP - sats now increased to 97%.

## 2015-08-20 NOTE — ED Notes (Addendum)
Brought by EMS from home for shortness of breath. Patient was 66% on 2LPM of o2 per EMS. Patient on c-pap on arrival. Patient alert and oriented x4. Has 1 nito PTA.

## 2015-08-20 NOTE — Progress Notes (Signed)
Pharmacy Antibiotic Note  [name] is a 81 y.o. female admitted on 08/16/2015 with COPD exacerbation.  Pharmacy has been consulted for [facility] dosing.  Plan: Levaquin 500mg  IV q48hrs Switch to PO when improved / appropriate Monitor labs, progress, c/s  Height: 5\' 3"  (160 cm) Weight: 129 lb (58.514 kg) IBW/kg (Calculated) : 52.4  Temp (24hrs), Avg:98.4 F (36.9 C), Min:98 F (36.7 C), Max:98.7 F (37.1 C)   Recent Labs Lab 08/14/15 0331 08/15/15 0248 08/16/15 0320 08/28/2015 1306  WBC 12.2* 12.8* 13.6* 20.6*  CREATININE 5.66* 5.40* 5.03* 5.30*    Estimated Creatinine Clearance: 7.2 mL/min (by C-G formula based on Cr of 5.3).    Allergies  Allergen Reactions  . Codeine Rash  . Sulfonamide Derivatives Hives   Antimicrobials this admission: Levaquin 6/5 >>   No results found for this or any previous visit (from the past 240 hour(s)).  Thank you for allowing pharmacy to be a part of this patient's care.  Hart Robinsons A 08/17/2015 5:22 PM

## 2015-08-20 NOTE — H&P (Signed)
TRH H&P   Patient Demographics:    [name], is a 81 y.o. female  MRN: [id number]   DOB - 1934/07/31  Admit Date - 09/03/2015  Outpatient Primary MD for the patient is Sherrie Mustache, MD  Referring MD/NP/PA: Noemi Chapel  Outpatient Specialists: [facility]   Patient coming from: home  Chief Complaint  Patient presents with  . Shortness of Breath      HPI:    [name]  is a 81 y.o. female, w/ ESRD not yet on HD, ? ILD, without pulmonary htn, AAA, CAD, CHF(EF 45-50%), apparently  Presents w/ c/o dyspnea at home.  Pox 60's .  Pt was brought to ED for evaluation and found to be hypoxic.   Pt denies fever, chills, cp, palp, orthopnea, pnd, lower ext edema.  Pt found to have persistent opacities in the lungs. Pt will be admitted for w/up of hypoxia, Acute respiratory failure secondary to ? ILD vs fluid overload secondary to ESRD.     Review of systems:    In addition to the HPI above, No Fever-chills, No Headache, No changes with Vision or hearing, No problems swallowing food or Liquids, No Chest pain, Cough  No Abdominal pain, No Nausea or Vommitting, Bowel movements are regular, No Blood in stool or Urine, No dysuria, No new skin rashes or bruises, No new joints pains-aches,  No new weakness, tingling, numbness in any extremity, No recent weight gain or loss, No polyuria, polydypsia or polyphagia, No significant Mental Stressors.  A full 10 point Review of Systems was done, except as stated above, all other Review of Systems were negative.   With Past History of the following :    Past Medical History  Diagnosis Date  . CAD (coronary artery disease)     50% proximal LAD stenosis, 50% marginal stenosis of the circumflex, RCA 95% stenosis, EF 40% stenosis with hypokinesis of the interior wall.  She had a Taxus stent placed in 04/16/2006  . Hypertension     . Abdominal aortic aneurysm ([place])   . Renal artery stenosis ([facility])     S/P stenting of the right renal atrophic left renal  . Dyslipidemia   . Hypothyroidism   . Tobacco user     Previous  . Adenomatous polyp 2004, 2011  . Hyperplastic colon polyp 2011  . Hemorrhoids   . Diverticulosis   . Renal insufficiency   . Anemia   . Myocardial infarction ([place])     stent 2008  . Constipation   . Cataract   . AAA (abdominal aortic aneurysm) [facility])       Past Surgical History  Procedure Laterality Date  . Abdominal hysterectomy    . Carpal tunnel release Bilateral   . Cholecystectomy    . Flexible sigmoidoscopy  02/25/2012    Procedure: FLEXIBLE SIGMOIDOSCOPY;  Surgeon: Inda Castle, MD;  Location: [facility];  Service: Endoscopy;  Laterality: N/A;  . Hemorrhoid banding  02/25/2012    Procedure: HEMORRHOID BANDING;  Surgeon: Inda Castle, MD;  Location: [facility] ENDOSCOPY;  Service: Endoscopy;  Laterality: N/A;  . Abdominal angiogram  12/21/2012    AAA  . Abdominal aortic endovascular stent graft N/A 12/22/2012    Procedure: ABDOMINAL AORTIC ENDOVASCULAR STENT GRAFT- GORE;  Surgeon: Mal Misty, MD;  Location: [place];  Service: Vascular;  Laterality: N/A;  Ultrasound guided  . Abdominal angiogram N/A 11/30/2012    Procedure: ABDOMINAL ANGIOGRAM;  Surgeon: Serafina Mitchell, MD;  Location: [facility] CATH LAB;  Service: Cardiovascular;  Laterality: N/A;  . Cataract extraction, bilateral    . Colonoscopy    . Polypectomy    . Cardiac catheterization  2008  . Av fistula placement Left 04/20/2015    Procedure: CREATION OF LEFT UPPER ARM BRACHIOCEPHALIC ARTERIOVENOUS (AV) FISTULA  ;  Surgeon: Mal Misty, MD;  Location: [place];  Service: Vascular;  Laterality: Left;  . Cardiac catheterization N/A 08/08/2015    Procedure: Right Heart Cath;  Surgeon: Jolaine Artist, MD;  Location: [facility];  Service: Cardiovascular;  Laterality: N/A;      Social History:     Social History   Substance Use Topics  . Smoking status: Former Smoker -- 1 years    Types: Cigarettes    Quit date: 03/17/2006  . Smokeless tobacco: Never Used  . Alcohol Use: No     Lives - at home  Mobility - unknown      Family History :     Family History  Problem Relation Age of Onset  . Uterine cancer Mother   . Heart attack Father 56  . Heart disease Father     before age 107  . Peripheral vascular disease Sister   . Other Sister     Renal artery stenosis  . Heart disease Sister     before age 27  . Heart attack Brother 34  . Heart disease Brother     before age 102  . Lung cancer Brother     \  . Colon cancer Neg Hx   . Breast cancer Maternal Aunt     x2      Home Medications:   Prior to Admission medications   Medication Sig Start Date End Date Taking? Authorizing Provider  arformoterol (BROVANA) 15 MCG/2ML NEBU Take 2 mLs (15 mcg total) by nebulization 2 (two) times daily. 08/16/15  Yes Theodis Blaze, MD  aspirin EC 81 MG tablet Take 81 mg by mouth daily.   Yes Historical Provider, MD  bisacodyl (BISACODYL) 5 MG EC tablet Take 5 mg by mouth daily as needed for moderate constipation.   Yes Historical Provider, MD  budesonide (PULMICORT) 0.5 MG/2ML nebulizer solution Take 2 mLs (0.5 mg total) by nebulization 2 (two) times daily. 08/16/15  Yes Theodis Blaze, MD  calcitRIOL (ROCALTROL) 0.25 MCG capsule Take 1 capsule (0.25 mcg total) by mouth daily. 08/16/15  Yes Theodis Blaze, MD  citalopram (CELEXA) 20 MG tablet Take 20 mg by mouth daily.  12/16/10  Yes Historical Provider, MD  cyanocobalamin (,VITAMIN B-12,) 1000 MCG/ML injection Inject 1,000 mcg into the muscle every 30 (thirty) days.    Yes Historical Provider, MD  diphenhydramine-acetaminophen (ACETAMINOPHEN PM) 25-500 MG TABS tablet Take 1 tablet by mouth at bedtime as needed.   Yes Historical Provider, MD  furosemide (LASIX) 80 MG tablet Take 1 tablet (80  mg total) by mouth daily. *MUST MAKE APPOINTMENT* 08/16/15  Yes Theodis Blaze, MD  ipratropium-albuterol (DUONEB) 0.5-2.5 (3) MG/3ML SOLN Take 3 mLs by nebulization 4 (four) times daily - after meals and at bedtime. 08/16/15  Yes Theodis Blaze, MD  levothyroxine (SYNTHROID, LEVOTHROID) 50 MCG tablet Take 50 mcg by mouth daily.    Yes Historical Provider, MD  meclizine (ANTIVERT) 25 MG tablet Take 1 tablet (25 mg total) by mouth 3 (three) times daily as needed for dizziness. 08/14/14  Yes Veryl Speak, MD  NIFEdipine (PROCARDIA XL/ADALAT-CC) 60 MG 24 hr tablet TAKE 1 TABLET (60 MG TOTAL) BY MOUTH DAILY. PATIENT NEEDS TO CONTACT OFFICE FOR ADDITIONAL REFILLS 07/12/15  Yes Minus Breeding, MD  nitroGLYCERIN (NITROSTAT) 0.4 MG SL tablet Place 0.4 mg under the tongue every 5 (five) minutes as needed for chest pain.   Yes Historical Provider, MD  omeprazole (PRILOSEC) 20 MG capsule Take 20 mg by mouth daily as needed (For heartburn or acid reflux.).  07/13/13  Yes Historical Provider, MD  pravastatin (PRAVACHOL) 80 MG tablet Take 80 mg by mouth at bedtime.  03/29/12  Yes Historical Provider, MD  predniSONE (DELTASONE) 20 MG tablet Take 40 mg tablet for 5 days, after that continue with 30 mg tablet once daily for 5 days, followed by 20 mg tablet daily for 5 days, 10 mg tablet for 5 days and ensure follow up with pulmonologist 08/16/15  Yes Theodis Blaze, MD  senna-docusate (SENOKOT-S) 8.6-50 MG tablet Take 1 tablet by mouth daily.   Yes Historical Provider, MD  triamcinolone (NASACORT ALLERGY 24HR) 55 MCG/ACT AERO nasal inhaler Place 2 sprays into the nose daily.   Yes Historical Provider, MD  HYDROcodone-acetaminophen (NORCO/VICODIN) 5-325 MG tablet Take 1 tablet by mouth every 6 (six) hours as needed for moderate pain. Patient not taking: Reported on 09/04/2015 08/14/15   Theodis Blaze, MD  Linaclotide [facility]) 145 MCG CAPS capsule Take 1 capsule (145 mcg total) by mouth daily. Patient not taking: Reported on 09/04/2015 06/07/15   Milus Banister, MD  traMADol (ULTRAM) 50 MG tablet Take 1  tablet (50 mg total) by mouth every 6 (six) hours as needed. Patient not taking: Reported on 09/08/2015 04/20/15   Alvia Grove, PA-C  traZODone (DESYREL) 50 MG tablet Take 0.5 tablets (25 mg total) by mouth at bedtime as needed for sleep. Patient not taking: Reported on 08/19/2015 08/14/15   Theodis Blaze, MD     Allergies:     Allergies  Allergen Reactions  . Codeine Rash  . Sulfonamide Derivatives Hives     Physical Exam:   Vitals  Blood pressure 128/76, pulse 78, temperature 97 F (36.1 C), temperature source Axillary, resp. rate 27, height 5\' 3"  (1.6 m), weight 58.514 kg (129 lb), SpO2 98 %.   1. General  lying in bed in NAD,    2. Normal affect and insight, Not Suicidal or Homicidal, Awake Alert, Oriented X 3.  3. No F.N deficits, ALL C.Nerves Intact, Strength 5/5 all 4 extremities, Sensation intact all 4 extremities, Plantars down going.  4. Ears and Eyes appear Normal, Conjunctivae clear, PERRLA. Moist Oral Mucosa.  5. Supple Neck,  ? Slight  JVD, No cervical lymphadenopathy appriciated, No Carotid Bruits.  6. Symmetrical Chest wall movement, Good air movement bilaterally, slight crackles at bil bases 7. RRR, No Gallops, Rubs or Murmurs, No Parasternal Heave.  8. Positive Bowel Sounds, Abdomen Soft, No tenderness, No organomegaly appriciated,No rebound -guarding or  rigidity.  9.  No Cyanosis, Normal Skin Turgor, No Skin Rash or Bruise.  10. Good muscle tone,  joints appear normal , no effusions, Normal ROM.  11. No Palpable Lymph Nodes in Neck or Axillae  L arm AVF   Data Review:    CBC  Recent Labs Lab 08/14/15 0331 08/15/15 0248 08/16/15 0320 09/11/2015 1306  WBC 12.2* 12.8* 13.6* 20.6*  HGB 9.3* 9.1* 9.0* 10.3*  HCT 30.7* 30.1* 29.5* 33.0*  PLT 478* 431* 409* 338  MCV 94.2 94.1 93.9 96.5  MCH 28.5 28.4 28.7 30.1  MCHC 30.3 30.2 30.5 31.2  RDW 15.1 15.1 15.7* 17.6*    ------------------------------------------------------------------------------------------------------------------  Chemistries   Recent Labs Lab 08/14/15 0331 08/15/15 0248 08/16/15 0320 09/02/2015 1306  NA 140 137 139 138  K 4.4 4.3 4.2 5.4*  CL 104 103 107 106  CO2 22 21* 22 20*  GLUCOSE 107* 183* 119* 172*  BUN 83* 77* 68* 78*  CREATININE 5.66* 5.40* 5.03* 5.30*  CALCIUM 8.0* 7.8* 8.1* 7.9*  AST  --   --   --  38  ALT  --   --   --  22  ALKPHOS  --   --   --  97  BILITOT  --   --   --  0.6   ------------------------------------------------------------------------------------------------------------------ estimated creatinine clearance is 7.2 mL/min (by C-G formula based on Cr of 5.3). ------------------------------------------------------------------------------------------------------------------ No results for input(s): TSH, T4TOTAL, T3FREE, THYROIDAB in the last 72 hours.  Invalid input(s): FREET3  Coagulation profile  Recent Labs Lab 08/22/2015 1306  INR 1.35   ------------------------------------------------------------------------------------------------------------------- No results for input(s): DDIMER in the last 72 hours. -------------------------------------------------------------------------------------------------------------------  Cardiac Enzymes  Recent Labs Lab 09/14/2015 1306  TROPONINI 0.07*   ------------------------------------------------------------------------------------------------------------------    Component Value Date/Time   BNP 1279.0* 08/28/2015 1306     ---------------------------------------------------------------------------------------------------------------  Urinalysis    Component Value Date/Time   COLORURINE YELLOW 08/08/2015 0550   APPEARANCEUR CLOUDY* 08/08/2015 0550   LABSPEC 1.015 08/08/2015 0550   PHURINE 5.5 08/08/2015 0550   GLUCOSEU NEGATIVE 08/08/2015 0550   HGBUR TRACE* 08/08/2015 0550    BILIRUBINUR NEGATIVE 08/08/2015 0550   KETONESUR NEGATIVE 08/08/2015 0550   PROTEINUR 30* 08/08/2015 0550   UROBILINOGEN 0.2 12/21/2012 1300   NITRITE NEGATIVE 08/08/2015 0550   LEUKOCYTESUR NEGATIVE 08/08/2015 0550    ----------------------------------------------------------------------------------------------------------------   Imaging Results:    Dg Chest Portable 1 View  08/16/2015  CLINICAL DATA:  Shortness of breath EXAM: PORTABLE CHEST 1 VIEW COMPARISON:  Aug 13, 2015 FINDINGS: No pneumothorax. Cardiomegaly persists. The hila and mediastinum are unchanged. Bilateral pulmonary opacities persist. No other interval changes. IMPRESSION: Persistent bilateral pulmonary opacities. These were better evaluated on the CT scan from Aug 08, 2015 Electronically Signed   By: Dorise Bullion III M.D   On: 08/24/2015 13:34      Assessment & Plan:    Active Problems:   Respiratory distress   Acute respiratory failure ([place])    1. Acute respiratory failure secondary to ? ILD vs CHF (EF 45-50%) secondary to volume overload secondary to ESRD.  Personally believe that may be due to volume overload.  Pt still urinates , we will try diuresis with iv lasix.  Bipap   2. ESRD  Consult nephrology may need dialysis   3.  ILD Solumedrol 80mg  iv q8h Levaquin 500mg  iv qday Cont current breathing treatments  4. Hypothyroidism Cont levothyroxine  5. CAD  Cont aspirin, pravastatin  DVT Prophylaxis Heparin - SCD  AM Labs Ordered, also please review Full Orders  Family Communication: Admission, patients condition and plan of care including tests being ordered have been discussed with the patient who indicate understanding and agree with the plan and Code Status.  Code Status FULL CODE  Likely DC to  ? snf  Condition CRITICAL  Consults called:   Nephrology consult entered incomputer   Admission status: inpatient  Time spent in minutes : [place] care  Jani Gravel M.D on  09/04/2015 at 8:43 PM  Between 7am to 7pm - Pager - [phone]. After 7pm go to [email or web address] - password [facility]  [facility] - Office  [phone]

## 2015-08-20 NOTE — ED Notes (Signed)
CBG 254 per EMS.

## 2015-08-21 LAB — CBC
HCT: 28.3 % — ABNORMAL LOW (ref 36.0–46.0)
Hemoglobin: 8.9 g/dL — ABNORMAL LOW (ref 12.0–15.0)
MCH: 30.2 pg (ref 26.0–34.0)
MCHC: 31.4 g/dL (ref 30.0–36.0)
MCV: 95.9 fL (ref 78.0–100.0)
PLATELETS: 314 10*3/uL (ref 150–400)
RBC: 2.95 MIL/uL — ABNORMAL LOW (ref 3.87–5.11)
RDW: 17.4 % — AB (ref 11.5–15.5)
WBC: 9 10*3/uL (ref 4.0–10.5)

## 2015-08-21 LAB — COMPREHENSIVE METABOLIC PANEL
ALBUMIN: 2.7 g/dL — AB (ref 3.5–5.0)
ALT: 19 U/L (ref 14–54)
AST: 19 U/L (ref 15–41)
Alkaline Phosphatase: 92 U/L (ref 38–126)
Anion gap: 16 — ABNORMAL HIGH (ref 5–15)
BUN: 85 mg/dL — AB (ref 6–20)
CHLORIDE: 106 mmol/L (ref 101–111)
CO2: 18 mmol/L — AB (ref 22–32)
CREATININE: 5.53 mg/dL — AB (ref 0.44–1.00)
Calcium: 7.7 mg/dL — ABNORMAL LOW (ref 8.9–10.3)
GFR calc Af Amer: 8 mL/min — ABNORMAL LOW (ref >60)
GFR calc non Af Amer: 7 mL/min — ABNORMAL LOW (ref >60)
Glucose, Bld: 133 mg/dL — ABNORMAL HIGH (ref 65–99)
Potassium: 4.6 mmol/L (ref 3.5–5.1)
SODIUM: 140 mmol/L (ref 135–145)
Total Bilirubin: 0.4 mg/dL (ref 0.3–1.2)
Total Protein: 6.1 g/dL — ABNORMAL LOW (ref 6.5–8.1)

## 2015-08-21 MED ORDER — METHYLPREDNISOLONE SODIUM SUCC 40 MG IJ SOLR
40.0000 mg | Freq: Three times a day (TID) | INTRAMUSCULAR | Status: DC
  Administered 2015-08-21 – 2015-08-23 (×7): 40 mg via INTRAVENOUS
  Filled 2015-08-21 (×7): qty 1

## 2015-08-21 MED ORDER — CYANOCOBALAMIN 1000 MCG/ML IJ SOLN: 1000.0000 ug | INTRAMUSCULAR | Status: DC

## 2015-08-21 MED ORDER — FUROSEMIDE 10 MG/ML IJ SOLN
120.0000 mg | Freq: Two times a day (BID) | INTRAVENOUS | Status: DC
  Administered 2015-08-21 – 2015-08-24 (×7): 120 mg via INTRAVENOUS
  Filled 2015-08-21 (×12): qty 12

## 2015-08-21 NOTE — Consult Note (Signed)
Reason for Consult: Fluid overload and chronic renal failure Referring Physician: Dr. Delorse Lek Berlinger is an 81 y.o. female.  HPI: She is a patient with history of hypertension, coronary artery disease, stage V. Chronic renal failure presently came with complaints of difficulty in breathing for the last 2 days. When she was evaluated patient was found to be hypoxic with some sign of fluid overload hence admitted to the hospital. Presently she is on BiPAP and claims she is feeling much better. She denies any nausea or vomiting. Patient denies any fever, chills, cough or chest pain.  Past Medical History  Diagnosis Date  . CAD (coronary artery disease)     50% proximal LAD stenosis, 50% marginal stenosis of the circumflex, RCA 95% stenosis, EF 40% stenosis with hypokinesis of the interior wall.  She had a Taxus stent placed in 04/16/2006  . Hypertension   . Abdominal aortic aneurysm ([place])   . Renal artery stenosis (HCC)     S/P stenting of the right renal atrophic left renal  . Dyslipidemia   . Hypothyroidism   . Tobacco user     Previous  . Adenomatous polyp 2004, 2011  . Hyperplastic colon polyp 2011  . Hemorrhoids   . Diverticulosis   . Renal insufficiency   . Anemia   . Myocardial infarction ([place])     stent 2008  . Constipation   . Cataract   . AAA (abdominal aortic aneurysm) [facility])     Past Surgical History  Procedure Laterality Date  . Abdominal hysterectomy    . Carpal tunnel release Bilateral   . Cholecystectomy    . Flexible sigmoidoscopy  02/25/2012    Procedure: FLEXIBLE SIGMOIDOSCOPY;  Surgeon: Inda Castle, MD;  Location: [facility];  Service: Endoscopy;  Laterality: N/A;  . Hemorrhoid banding  02/25/2012    Procedure: HEMORRHOID BANDING;  Surgeon: Inda Castle, MD;  Location: [facility];  Service: Endoscopy;  Laterality: N/A;  . Abdominal angiogram  12/21/2012    AAA  . Abdominal aortic endovascular stent graft N/A 12/22/2012    Procedure:  ABDOMINAL AORTIC ENDOVASCULAR STENT GRAFT- GORE;  Surgeon: Mal Misty, MD;  Location: [place];  Service: Vascular;  Laterality: N/A;  Ultrasound guided  . Abdominal angiogram N/A 11/30/2012    Procedure: ABDOMINAL ANGIOGRAM;  Surgeon: Serafina Mitchell, MD;  Location: [facility] CATH LAB;  Service: Cardiovascular;  Laterality: N/A;  . Cataract extraction, bilateral    . Colonoscopy    . Polypectomy    . Cardiac catheterization  2008  . Av fistula placement Left 04/20/2015    Procedure: CREATION OF LEFT UPPER ARM BRACHIOCEPHALIC ARTERIOVENOUS (AV) FISTULA  ;  Surgeon: Mal Misty, MD;  Location: [place];  Service: Vascular;  Laterality: Left;  . Cardiac catheterization N/A 08/08/2015    Procedure: Right Heart Cath;  Surgeon: Jolaine Artist, MD;  Location: [place] CV LAB;  Service: Cardiovascular;  Laterality: N/A;    Family History  Problem Relation Age of Onset  . Uterine cancer Mother   . Heart attack Father 8  . Heart disease Father     before age 83  . Peripheral vascular disease Sister   . Other Sister     Renal artery stenosis  . Heart disease Sister     before age 40  . Heart attack Brother 8  . Heart disease Brother     before age 4  . Lung cancer Brother     \  .  Colon cancer Neg Hx   . Breast cancer Maternal Aunt     x2    Social History:  reports that she quit smoking about 9 years ago. Her smoking use included Cigarettes. She quit after 1 year of use. She has never used smokeless tobacco. She reports that she does not drink alcohol or use illicit drugs.  Allergies:  Allergies  Allergen Reactions  . Codeine Rash  . Sulfonamide Derivatives Hives    Medications: I have reviewed the patient's current medications.  Results for orders placed or performed during the hospital encounter of 09/13/2015 (from the past 48 hour(s))  APTT     Status: None   Collection Time: 09/09/2015  1:06 PM  Result Value Ref Range   aPTT 24 24 - 37 seconds  CBC     Status: Abnormal    Collection Time: 08/24/2015  1:06 PM  Result Value Ref Range   WBC 20.6 (H) 4.0 - 10.5 K/uL   RBC 3.42 (L) 3.87 - 5.11 MIL/uL   Hemoglobin 10.3 (L) 12.0 - 15.0 g/dL   HCT 33.0 (L) 36.0 - 46.0 %   MCV 96.5 78.0 - 100.0 fL   MCH 30.1 26.0 - 34.0 pg   MCHC 31.2 30.0 - 36.0 g/dL   RDW 17.6 (H) 11.5 - 15.5 %   Platelets 338 150 - 400 K/uL  Comprehensive metabolic panel     Status: Abnormal   Collection Time: 08/17/2015  1:06 PM  Result Value Ref Range   Sodium 138 135 - 145 mmol/L   Potassium 5.4 (H) 3.5 - 5.1 mmol/L   Chloride 106 101 - 111 mmol/L   CO2 20 (L) 22 - 32 mmol/L   Glucose, Bld 172 (H) 65 - 99 mg/dL   BUN 78 (H) 6 - 20 mg/dL   Creatinine, Ser 5.30 (H) 0.44 - 1.00 mg/dL   Calcium 7.9 (L) 8.9 - 10.3 mg/dL   Total Protein 7.0 6.5 - 8.1 g/dL   Albumin 3.0 (L) 3.5 - 5.0 g/dL   AST 38 15 - 41 U/L   ALT 22 14 - 54 U/L   Alkaline Phosphatase 97 38 - 126 U/L   Total Bilirubin 0.6 0.3 - 1.2 mg/dL   GFR calc non Af Amer 7 (L) >60 mL/min   GFR calc Af Amer 8 (L) >60 mL/min    Comment: (NOTE) The eGFR has been calculated using the CKD EPI equation. This calculation has not been validated in all clinical situations. eGFR's persistently <60 mL/min signify possible Chronic Kidney Disease.    Anion gap 12 5 - 15  Protime-INR     Status: Abnormal   Collection Time: 08/19/2015  1:06 PM  Result Value Ref Range   Prothrombin Time 16.8 (H) 11.6 - 15.2 seconds   INR 1.35 0.00 - 1.49  Troponin I     Status: Abnormal   Collection Time: 09/01/2015  1:06 PM  Result Value Ref Range   Troponin I 0.07 (H) <0.031 ng/mL    Comment:        PERSISTENTLY INCREASED TROPONIN VALUES IN THE RANGE OF 0.04-0.49 ng/mL CAN BE SEEN IN:       -UNSTABLE ANGINA       -CONGESTIVE HEART FAILURE       -MYOCARDITIS       -CHEST TRAUMA       -ARRYHTHMIAS       -LATE PRESENTING MYOCARDIAL INFARCTION       -COPD   CLINICAL FOLLOW-UP RECOMMENDED.  Brain natriuretic peptide (order if patient c/o SOB ONLY)      Status: Abnormal   Collection Time: 09/02/2015  1:06 PM  Result Value Ref Range   B Natriuretic Peptide 1279.0 (H) 0.0 - 100.0 pg/mL  Blood gas, arterial     Status: Abnormal   Collection Time: 09/04/2015  1:50 PM  Result Value Ref Range   FIO2 0.60    Delivery systems BILEVEL POSITIVE AIRWAY PRESSURE    LHR 10.0 resp/min   Inspiratory PAP 10    Expiratory PAP 8    pH, Arterial 7.388 7.350 - 7.450   pCO2 arterial 33.1 (L) 35.0 - 45.0 mmHg   pO2, Arterial 113 (H) 80.0 - 100.0 mmHg   Bicarbonate 20.9 20.0 - 24.0 mEq/L   Acid-base deficit 4.6 (H) 0.0 - 2.0 mmol/L   O2 Saturation 97.7 %   Patient temperature 37.0    Collection site RIGHT RADIAL    Drawn by [id number]    Sample type ARTERIAL DRAW    Allens test (pass/fail) PASS PASS  MRSA PCR Screening     Status: None   Collection Time: 08/19/2015  4:50 PM  Result Value Ref Range   MRSA by PCR NEGATIVE NEGATIVE    Comment:        The GeneXpert MRSA Assay (FDA approved for NASAL specimens only), is one component of a comprehensive MRSA colonization surveillance program. It is not intended to diagnose MRSA infection nor to guide or monitor treatment for MRSA infections.   Comprehensive metabolic panel     Status: Abnormal   Collection Time: 08/21/15  5:06 AM  Result Value Ref Range   Sodium 140 135 - 145 mmol/L   Potassium 4.6 3.5 - 5.1 mmol/L   Chloride 106 101 - 111 mmol/L   CO2 18 (L) 22 - 32 mmol/L   Glucose, Bld 133 (H) 65 - 99 mg/dL   BUN 85 (H) 6 - 20 mg/dL   Creatinine, Ser 5.53 (H) 0.44 - 1.00 mg/dL   Calcium 7.7 (L) 8.9 - 10.3 mg/dL   Total Protein 6.1 (L) 6.5 - 8.1 g/dL   Albumin 2.7 (L) 3.5 - 5.0 g/dL   AST 19 15 - 41 U/L   ALT 19 14 - 54 U/L   Alkaline Phosphatase 92 38 - 126 U/L   Total Bilirubin 0.4 0.3 - 1.2 mg/dL   GFR calc non Af Amer 7 (L) >60 mL/min   GFR calc Af Amer 8 (L) >60 mL/min    Comment: (NOTE) The eGFR has been calculated using the CKD EPI equation. This calculation has not been validated in  all clinical situations. eGFR's persistently <60 mL/min signify possible Chronic Kidney Disease.    Anion gap 16 (H) 5 - 15  CBC     Status: Abnormal   Collection Time: 08/21/15  5:06 AM  Result Value Ref Range   WBC 9.0 4.0 - 10.5 K/uL   RBC 2.95 (L) 3.87 - 5.11 MIL/uL   Hemoglobin 8.9 (L) 12.0 - 15.0 g/dL   HCT 28.3 (L) 36.0 - 46.0 %   MCV 95.9 78.0 - 100.0 fL   MCH 30.2 26.0 - 34.0 pg   MCHC 31.4 30.0 - 36.0 g/dL   RDW 17.4 (H) 11.5 - 15.5 %   Platelets 314 150 - 400 K/uL    Dg Chest Portable 1 View  09/04/2015  CLINICAL DATA:  Shortness of breath EXAM: PORTABLE CHEST 1 VIEW COMPARISON:  Aug 13, 2015 FINDINGS: No pneumothorax. Cardiomegaly persists.  The hila and mediastinum are unchanged. Bilateral pulmonary opacities persist. No other interval changes. IMPRESSION: Persistent bilateral pulmonary opacities. These were better evaluated on the CT scan from Aug 08, 2015 Electronically Signed   By: Dorise Bullion III M.D   On: 09/01/2015 13:34    Review of Systems  Constitutional: Negative for fever.  Respiratory: Positive for shortness of breath and wheezing. Negative for cough and sputum production.   Cardiovascular: Positive for orthopnea. Negative for chest pain and PND.  Gastrointestinal: Negative for nausea and vomiting.  Neurological: Positive for weakness.   Blood pressure 123/71, pulse 82, temperature 97 F (36.1 C), temperature source Axillary, resp. rate 28, height 5' 3"  (1.6 m), weight 59 kg (130 lb 1.1 oz), SpO2 96 %. Physical Exam  Constitutional: She is oriented to person, place, and time. No distress.  Eyes: No scleral icterus.  Neck: JVD present.  Cardiovascular: Normal rate.   Respiratory: She has wheezes. She has rales.  GI: She exhibits no distension. There is no tenderness.  Musculoskeletal: She exhibits no edema.  Neurological: She is alert and oriented to person, place, and time.    Assessment/Plan: Problem #1 possible fluid overload/pneumonia.  Presently on low dose of Lasix. She has about 400 mL of urine output and claims to feel better. Patient was also on steroid inhaler. Patient is a febrile and her white blood cell count has improved. Problem #2 renal failure: Chronic and stage V . Patient is being followed by Dr. Florene Glen. Patient denies any nausea or vomiting or any other sign of uremia. She has a fistula on her left arm. Problem #3 coronary artery disease: Patient doesn't have any chest pain Problem #4 anemia: Her hemoglobin has declined and below her target goal. Problem #5. Metabolic bone disease: Her calcium is range but phosphorus is not available. Presently patient is on Rocaltrol. Problem #6 history of abdominal aortic aneurysm Problem #7 history of hypo-thyroidism Plan: I have discussed with the patient about dialysis because of her fluid overload. Since she is feeling better she would like to try using diuretics. If that doesn't work possibly would consider dialysis. 2] will check her renal panel and iron studies in the morning 3] will increase Lasix to 120 mg IV twice a day. 4] will initiate dialysis if patient condition decline further.  Selwyn Reason S 08/21/2015, 8:04 AM

## 2015-08-21 NOTE — Progress Notes (Signed)
PROGRESS NOTE    [name]  [id number] DOB: 1934-06-30 DOA: 08/22/2015 PCP: Sherrie Mustache, MD     Brief Narrative:  81 year old woman admitted on 6/5 with complaints of shortness of breath. She is found to be hypoxic and with signs of fluid overload. She did require BiPAP and is now on high flow nasal cannula oxygen. Has been seen in consultation by Dr. Hinda Lenis. She does have diastolic CHF as well as chronic kidney disease stage IV to 5.   Assessment & Plan:   Active Problems:   Respiratory distress   Acute respiratory failure (HCC)   Interstitial lung disease (HCC)   Acute hypoxemic respiratory failure -Due to pulmonary edema on top of interstitial lung disease, doubt pneumonia. -We'll discontinue antibiotics. -Patient elected to see if she can avoid dialysis for now, nephrology has ordered high-dose Lasix, monitor intake and output. -Continue oxygen supplementation as needed.  Acute on chronic diastolic CHF -Echo [id number]: Left ventricle: The cavity size was normal. Wall thickness was  increased in a pattern of mild LVH. Systolic function was mildly  reduced. The estimated ejection fraction was in the range of 45%  to 50%. Doppler parameters are consistent with abnormal left  ventricular relaxation (grade 1 diastolic dysfunction). -Continue Lasix and strive for negative fluid balance.  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  Questionable interstitial lung disease  -do not believe we can diagnose her with this until she is adequately diuresed. -We'll discontinue antibiotics, but advocate for quick steroid taper.  Coronary artery disease  -stable, no chest pain, continue aspirin and statin.  Hypothyroidism -Continue Synthroid.   DVT prophylaxis: Subcutaneous heparin Code Status: Full code Family Communication: Daughter Helene Kelp at bedside, updated on plan of care and all questions answered Disposition  Plan: Keep in ICU today.  Consultants:   Nephrology  Procedures:   None  Antimicrobials:   None    Subjective: Feels improved, less short of breath.  Objective: Filed Vitals:   08/21/15 0759 08/21/15 0800 08/21/15 0848 08/21/15 0900  BP:  121/67  85/75  Pulse:  84  84  Temp:   97.9 F (36.6 C)   TempSrc:   Axillary   Resp:  24  23  Height:      Weight:      SpO2: 96% 96%  99%    Intake/Output Summary (Last 24 hours) at 08/21/15 1048 Last data filed at 08/21/15 0848  Gross per 24 hour  Intake    240 ml  Output    400 ml  Net   -160 ml   Filed Weights   09/03/2015 1303 08/21/15 0500  Weight: 58.514 kg (129 lb) 59 kg (130 lb 1.1 oz)    Examination:  General exam: Alert, awake, oriented x 3 Respiratory system: Bilateral crackles Cardiovascular system:RRR. No murmurs, rubs, gallops. Gastrointestinal system: Abdomen is nondistended, soft and nontender. No organomegaly or masses felt. Normal bowel sounds heard. Central nervous system: Alert and oriented. No focal neurological deficits. Extremities: She has bilateral edema, positive pedal pulses Skin: No rashes, lesions or ulcers Psychiatry: Judgement and insight appear normal. Mood & affect appropriate.     Data Reviewed: I have personally reviewed following labs and imaging studies  CBC:  Recent Labs Lab 08/15/15 0248 08/16/15 0320 08/22/2015 1306 08/21/15 0506  WBC 12.8* 13.6* 20.6* 9.0  HGB 9.1* 9.0* 10.3* 8.9*  HCT 30.1* 29.5* 33.0* 28.3*  MCV 94.1 93.9 96.5 95.9  PLT 431* 409*  338 [id number]   Basic Metabolic Panel:  Recent Labs Lab 08/15/15 0248 08/16/15 0320 08/16/2015 1306 08/21/15 0506  NA 137 139 138 140  K 4.3 4.2 5.4* 4.6  CL 103 107 106 106  CO2 21* 22 20* 18*  GLUCOSE 183* 119* 172* 133*  BUN 77* 68* 78* 85*  CREATININE 5.40* 5.03* 5.30* 5.53*  CALCIUM 7.8* 8.1* 7.9* 7.7*  PHOS 5.6*  --   --   --    GFR: Estimated Creatinine Clearance: 6.9 mL/min (by C-G formula based on Cr of  5.53). Liver Function Tests:  Recent Labs Lab 08/15/15 0248 09/01/2015 1306 08/21/15 0506  AST  --  38 19  ALT  --  22 19  ALKPHOS  --  97 92  BILITOT  --  0.6 0.4  PROT  --  7.0 6.1*  ALBUMIN 2.6* 3.0* 2.7*   No results for input(s): LIPASE, AMYLASE in the last 168 hours. No results for input(s): AMMONIA in the last 168 hours. Coagulation Profile:  Recent Labs Lab 09/14/2015 1306  INR 1.35   Cardiac Enzymes:  Recent Labs Lab 09/07/2015 1306  TROPONINI 0.07*   BNP (last 3 results) No results for input(s): PROBNP in the last 8760 hours. HbA1C: No results for input(s): HGBA1C in the last 72 hours. CBG:  Recent Labs Lab 08/15/15 1130 08/15/15 1631 08/15/15 2053 08/16/15 0609 08/16/15 1133  GLUCAP 227* 109* 127* 103* 108*   Lipid Profile: No results for input(s): CHOL, HDL, LDLCALC, TRIG, CHOLHDL, LDLDIRECT in the last 72 hours. Thyroid Function Tests: No results for input(s): TSH, T4TOTAL, FREET4, T3FREE, THYROIDAB in the last 72 hours. Anemia Panel: No results for input(s): VITAMINB12, FOLATE, FERRITIN, TIBC, IRON, RETICCTPCT in the last 72 hours. Urine analysis:    Component Value Date/Time   COLORURINE YELLOW 08/08/2015 0550   APPEARANCEUR CLOUDY* 08/08/2015 0550   LABSPEC 1.015 08/08/2015 0550   PHURINE 5.5 08/08/2015 0550   GLUCOSEU NEGATIVE 08/08/2015 0550   HGBUR TRACE* 08/08/2015 0550   BILIRUBINUR NEGATIVE 08/08/2015 0550   KETONESUR NEGATIVE 08/08/2015 0550   PROTEINUR 30* 08/08/2015 0550   UROBILINOGEN 0.2 12/21/2012 1300   NITRITE NEGATIVE 08/08/2015 0550   LEUKOCYTESUR NEGATIVE 08/08/2015 0550   Sepsis Labs: @LABRCNTIP (procalcitonin:4,lacticidven:4)  ) Recent Results (from the past 240 hour(s))  MRSA PCR Screening     Status: None   Collection Time: 09/06/2015  4:50 PM  Result Value Ref Range Status   MRSA by PCR NEGATIVE NEGATIVE Final    Comment:        The GeneXpert MRSA Assay (FDA approved for NASAL specimens only), is one  component of a comprehensive MRSA colonization surveillance program. It is not intended to diagnose MRSA infection nor to guide or monitor treatment for MRSA infections.          Radiology Studies: Dg Chest Portable 1 View  08/21/2015  CLINICAL DATA:  Shortness of breath EXAM: PORTABLE CHEST 1 VIEW COMPARISON:  Aug 13, 2015 FINDINGS: No pneumothorax. Cardiomegaly persists. The hila and mediastinum are unchanged. Bilateral pulmonary opacities persist. No other interval changes. IMPRESSION: Persistent bilateral pulmonary opacities. These were better evaluated on the CT scan from Aug 08, 2015 Electronically Signed   By: Dorise Bullion III M.D   On: 08/25/2015 13:34        Scheduled Meds: . arformoterol  15 mcg Nebulization BID  . aspirin EC  81 mg Oral Daily  . budesonide  0.5 mg Nebulization BID  . calcitRIOL  0.25 mcg Oral Daily  .  citalopram  20 mg Oral Daily  . cyanocobalamin  1,000 mcg Intramuscular Q30 days  . fluticasone  2 spray Each Nare Daily  . furosemide  120 mg Intravenous BID  . heparin  5,000 Units Subcutaneous Q8H  . ipratropium-albuterol  3 mL Nebulization QID  . levofloxacin (LEVAQUIN) IV  500 mg Intravenous Q48H  . levothyroxine  50 mcg Oral QAC breakfast  . methylPREDNISolone (SOLU-MEDROL) injection  80 mg Intravenous Q8H  . NIFEdipine  60 mg Oral Daily  . pantoprazole  40 mg Oral Daily  . pravastatin  80 mg Oral QHS  . senna-docusate  1 tablet Oral Daily  . sodium chloride flush  3 mL Intravenous Q12H  . sodium chloride flush  3 mL Intravenous Q12H   Continuous Infusions:    LOS: 1 day    Time spent: 25 minutes. Greater than 50% of this time was spent in direct contact with the patient coordinating care.     Lelon Frohlich, MD [facility] Pager [phone]  If 7PM-7AM, please contact night-coverage [email or web address] Password [email or web address] 08/21/2015, 10:48 AM

## 2015-08-21 NOTE — Progress Notes (Signed)
Patient off BIPAP, RT placed patient on 15L HFNC. Patient appears to be tolerating well, maintaining SATs 95%, RR 18  And HR 91. RT will continue to monitor.

## 2015-08-21 NOTE — Care Management Note (Signed)
Case Management Note  Patient Details  Name: EMBERLYNN GEVEDON MRN: [id number] Date of Birth: 1934-05-02  Subjective/Objective:  Spoke with patient and family at bedside Spouse and daughter. Patient is from home with husband, Uses a walker at home and is open with [facility] for PT RN and Aid. Recent admission at [facility] for same problem. Husband stated that she was released to soon, Has Home O2 supplied by [facility]. Husband drives and states no difficulty with filling Rx.   Anticipate return to home with continuation of home health services.                Action/Plan: Home  With Home Health   Expected Discharge Date:                  Expected Discharge Plan:  [place]  In-House Referral:     Discharge planning Services  CM Consult  Post Acute Care Choice:    Choice offered to:     DME Arranged:    DME Agency:  [place]:    [facility] Agency:  [place]  Status of Service:  In process, will continue to follow  Medicare Important Message Given:    Date Medicare IM Given:    Medicare IM give by:    Date Additional Medicare IM Given:    Additional Medicare Important Message give by:     If discussed at [place] of Stay Meetings, dates discussed:    Additional Comments:  Alvie Heidelberg, RN 08/21/2015, 12:07 PM

## 2015-08-22 DIAGNOSIS — I5033 Acute on chronic diastolic (congestive) heart failure: Secondary | ICD-10-CM

## 2015-08-22 LAB — RENAL FUNCTION PANEL
ALBUMIN: 2.5 g/dL — AB (ref 3.5–5.0)
ANION GAP: 12 (ref 5–15)
BUN: 98 mg/dL — ABNORMAL HIGH (ref 6–20)
CALCIUM: 7.1 mg/dL — AB (ref 8.9–10.3)
CO2: 23 mmol/L (ref 22–32)
Chloride: 101 mmol/L (ref 101–111)
Creatinine, Ser: 6.24 mg/dL — ABNORMAL HIGH (ref 0.44–1.00)
GFR calc Af Amer: 7 mL/min — ABNORMAL LOW (ref >60)
GFR, EST NON AFRICAN AMERICAN: 6 mL/min — AB (ref >60)
Glucose, Bld: 136 mg/dL — ABNORMAL HIGH (ref 65–99)
PHOSPHORUS: 8.1 mg/dL — AB (ref 2.5–4.6)
POTASSIUM: 3.8 mmol/L (ref 3.5–5.1)
SODIUM: 136 mmol/L (ref 135–145)

## 2015-08-22 LAB — IRON AND TIBC
IRON: 40 ug/dL (ref 28–170)
SATURATION RATIOS: 21 % (ref 10.4–31.8)
TIBC: 190 ug/dL — ABNORMAL LOW (ref 250–450)
UIBC: 150 ug/dL

## 2015-08-22 LAB — FERRITIN: Ferritin: 646 ng/mL — ABNORMAL HIGH (ref 11–307)

## 2015-08-22 NOTE — Progress Notes (Signed)
PROGRESS NOTE  JD WALTZ [id number] DOB: Sep 22, 1934 DOA: 08/27/2015 PCP: Sherrie Mustache, MD  Brief Narrative: 81 year old woman with complex medical history and recent 14 day hospitalization at [facility], discharge 6/1 with discharge diagnoses of acute on chronic kidney disease, acute on chronic diastolic heart failure, acute hypoxic respiratory failure with pulmonary infiltrates of unclear etiology, treated with empiric steroids and recommendation for outpatient follow-up; presented 6/5 with increasing shortness of breath, at that time requiring BiPAP and felt to be an exacerbation of  Assessment/Plan: 1. Acute hypoxic respiratory failure secondary to pulmonary edema superimposed on suspected interstitial lung disease. No evidence of pneumonia. Initially required BiPAP, but has weaned down to HFNC. Continue to wean as tolerated.  2. Acute on chronic diastolic congestive heart failure. LVEF 45-50 percent by echo May 2017. Per pulm note "per cardiology Procardia can interfere with hypoxic pulmonary pulmonary vasoconstriction and potentially contribute V/Q mismatch and increased oxygen dependency in patients with lung disease. 3. Acute on chronic kidney disease stage V, close to dialysis. Nephrology following. 4. Possible interstitial lung disease. Seen by pulmonology earlier this month, etiology uncertain, differential broad including autoimmune disease, pulmonary fibrosis, BOOP, viral pneumonitis, pulmonary edema. Rotavirus was positive on recent respiratory panel. As of 6/1 pulmonology recommended home oxygen 3 L at rest, 4 L with exertion, Pulmicort, Brovana, pred 40 mg/d x 5 days, then 30 mg/d x 5 days then 20 mg/d x 5 days, then 10 mg/d until f/u with pulmonary, outpatient follow-up June 14 1:30pm with Dr. Corrie Dandy with repeat CT chest at the end of the month. Bronchoscopy with BAL and lung biopsy being considered as an outpatient. 5. Coronary artery disease. Stable. Continue aspirin,  statin. 6. Chronic anemia   Appears to be improving.  Continue diuresis, steroid, breathing treatments, and supplemental oxygen.   Appreciate nephrology input.   Transfer to telemetry bed today. Anticipate discharge in 2-3 days.   DVT prophylaxis: SCDs Code Status: Full Family Communication: Lenna Sciara, daughter bedside. Disposition Plan: Anticipate discharge in 2-3 days.   Murray Hodgkins, MD  [facility] Direct contact: [phone] --Via amion app OR  --[email or web address]; password [email or web address]  7PM-7AM contact night coverage as above 08/22/2015, 11:54 AM  LOS: 2 days   Consultants:  Nephrology   Procedures:  None  Antimicrobials:  None  HPI/Subjective: Feels pretty good. Pain has resolved. Denies any nausea and vomiting. Breathing has improved. Denies using BiPAP overnight.   Objective: Filed Vitals:   08/22/15 0746 08/22/15 0752 08/22/15 0839 08/22/15 1133  BP:      Pulse:      Temp:   97.7 F (36.5 C)   TempSrc:   Oral   Resp:      Height:      Weight:      SpO2: 93% 97%  97%    Intake/Output Summary (Last 24 hours) at 08/22/15 1154 Last data filed at 08/22/15 0830  Gross per 24 hour  Intake    782 ml  Output      0 ml  Net    782 ml     Filed Weights   08/29/2015 1303 08/21/15 0500 08/22/15 0500  Weight: 58.514 kg (129 lb) 59 kg (130 lb 1.1 oz) 59.5 kg (131 lb 2.8 oz)    Exam: Constitutional:  . Appears calm and comfortable Eyes:  . PERRL and irises appear normal . Conjunctivae and lids appear normal ENMT:  . external ears, nose appear normal . grossly normal hearing  . Lips appear normal Neck:  .  neck appears normal, no masses, normal ROM . no thyromegaly Respiratory:  . CTA bilaterally, no w/r/r.  . Respiratory effort normal. No retractions or accessory muscle use Cardiovascular:  . RRR, no m/r/g . No LE extremity edema   . Telemetry SR Abdomen:  . Abdomen appears normal; no tenderness or masses Musculoskeletal:  . RUE, LUE, RLE,  LLE   o strength and tone normal, no atrophy, no abnormal movements Neurologic:  . Grossly normal Psychiatric:  . judgement and insight appear normal . Mental status o Mood, affect appropriate  I have personally reviewed following labs and imaging studies:  BMP elevated. Creatinine 6.24/ BUN 98  Scheduled Meds: . arformoterol  15 mcg Nebulization BID  . aspirin EC  81 mg Oral Daily  . budesonide  0.5 mg Nebulization BID  . calcitRIOL  0.25 mcg Oral Daily  . citalopram  20 mg Oral Daily  . [START ON 09/22/2015] cyanocobalamin  1,000 mcg Intramuscular Q30 days  . fluticasone  2 spray Each Nare Daily  . furosemide  120 mg Intravenous BID  . heparin  5,000 Units Subcutaneous Q8H  . ipratropium-albuterol  3 mL Nebulization QID  . levothyroxine  50 mcg Oral QAC breakfast  . methylPREDNISolone (SOLU-MEDROL) injection  40 mg Intravenous Q8H  . pantoprazole  40 mg Oral Daily  . pravastatin  80 mg Oral QHS  . senna-docusate  1 tablet Oral Daily  . sodium chloride flush  3 mL Intravenous Q12H  . sodium chloride flush  3 mL Intravenous Q12H   Continuous Infusions:   Principal Problem:   Acute renal failure superimposed on stage 4 chronic kidney disease (HCC) Active Problems:   ILD (interstitial lung disease) ([place])   Interstitial lung disease (HCC)   Acute on chronic diastolic CHF (congestive heart failure) ([place])   LOS: 2 days   Time spent 25 minutes  By signing my name below, I, Rennis Harding attest that this documentation has been prepared under the direction and in the presence of Murray Hodgkins, MD Electronically signed: Rennis Harding  08/22/2015 10:41am   I personally performed the services described in this documentation. All medical record entries made by the scribe were at my direction. I have reviewed the chart and agree that the record reflects my personal performance and is accurate and complete. Murray Hodgkins, MD

## 2015-08-22 NOTE — Progress Notes (Signed)
[name]  MRN: [id number]  DOB/AGE: [birth year] 81 y.o.  Primary [place], MD  Admit date: 09/04/2015  Chief Complaint:  Chief Complaint  Patient presents with  . Shortness of Breath    S-Pt presented on  08/19/2015 with  Chief Complaint  Patient presents with  . Shortness of Breath  .    Pt today feels better. On further questioning pt does mention about decreased appetite and malaise.   Meds  . arformoterol  15 mcg Nebulization BID  . aspirin EC  81 mg Oral Daily  . budesonide  0.5 mg Nebulization BID  . calcitRIOL  0.25 mcg Oral Daily  . citalopram  20 mg Oral Daily  . [START ON 09/22/2015] cyanocobalamin  1,000 mcg Intramuscular Q30 days  . fluticasone  2 spray Each Nare Daily  . furosemide  120 mg Intravenous BID  . heparin  5,000 Units Subcutaneous Q8H  . ipratropium-albuterol  3 mL Nebulization QID  . levothyroxine  50 mcg Oral QAC breakfast  . methylPREDNISolone (SOLU-MEDROL) injection  40 mg Intravenous Q8H  . pantoprazole  40 mg Oral Daily  . pravastatin  80 mg Oral QHS  . senna-docusate  1 tablet Oral Daily  . sodium chloride flush  3 mL Intravenous Q12H  . sodium chloride flush  3 mL Intravenous Q12H          Physical Exam: Vital signs in last 24 hours: Temp:  [97.6 F (36.4 C)-98.4 F (36.9 C)] 97.7 F (36.5 C) (06/07 0839) Pulse Rate:  [76-85] 84 (06/07 1200) Resp:  [20-29] 29 (06/07 1200) BP: (107-124)/(63-86) 124/71 mmHg (06/07 1200) SpO2:  [90 %-99 %] 95 % (06/07 1200) FiO2 (%):  [40 %] 40 % (06/07 0752) Weight:  [131 lb 2.8 oz (59.5 kg)] 131 lb 2.8 oz (59.5 kg) (06/07 0500) Weight change: 2 lb 2.8 oz (0.986 kg) Last BM Date: 08/19/15 (per pt)  Intake/Output from previous day: 06/06 0701 - 06/07 0700 In: 782 [P.O.:720; IV Piggyback:62] Out: -  Total I/O In: 420 [P.O.:420] Out: -    Physical Exam: General- pt is awake,alert, oriented to time place and person Resp- No acute REsp distress, Rhonchi  present CVS- S1S2 regular in rate and rhythm GIT- BS+, soft, NT, ND EXT- NO LE Edema, Cyanosis Access- left AVF +  Lab Results: CBC  Recent Labs  09/04/2015 1306 08/21/15 0506  WBC 20.6* 9.0  HGB 10.3* 8.9*  HCT 33.0* 28.3*  PLT 338 314    BMET  Recent Labs  08/21/15 0506 08/22/15 0437  NA 140 136  K 4.6 3.8  CL 106 101  CO2 18* 23  GLUCOSE 133* 136*  BUN 85* 98*  CREATININE 5.53* 6.24*  CALCIUM 7.7* 7.1*    Creat trend 2017  5.0=>6.24 2016  3.5 2014  2.3--2.9 2008  3.0--3.5  MICRO Recent Results (from the past 240 hour(s))  MRSA PCR Screening     Status: None   Collection Time: 08/28/2015  4:50 PM  Result Value Ref Range Status   MRSA by PCR NEGATIVE NEGATIVE Final    Comment:        The GeneXpert MRSA Assay (FDA approved for NASAL specimens only), is one component of a comprehensive MRSA colonization surveillance program. It is not intended to diagnose MRSA infection nor to guide or monitor treatment for MRSA infections.       Lab Results  Component Value Date   PTH 352* 08/13/2015   CALCIUM 7.1* 08/22/2015   CAION  1.13 11/30/2012   PHOS 8.1* 08/22/2015   Albumin 2.5 Corrected calcium 7.1+ 1.2=8.3            Impression: 1)Renal  AKI secondary to ATN                AKI sec to SIRS               CKD stage 5.               CKD since 2008               CKD secondary to HTN                Progression of CKD as expected with HTN                          Uremia- mild symptoms of malaise, change in tatse and appetite                  Pt BUN on higher side ( steriods  Side effect)                  Pt not willing to intiate HD today                  2)HTN  Medication- ON Diuretics On Calcium Channel Blockers    3)Anemia HGb low   4)CKD Mineral-Bone Disorder PTH elevated . Secondary Hyperparathyroidism present .     On calcitriol Phosphorus NOT at goal. Calcium near to goal ( after correcting for low albumin)  5)REsp  -admitted with possibly interstitial lung disease Primary MD following  6)Electrolytes Normokalemic NOrmonatremic  7)Acid base Co2 now at goal Pt had non AG acidosis , now better    Plan:  Will continue current care. I educated pt about need for HD soon. I educated pt about her current kidney function and how her symptoms could be from kidney issues.After extensive discussion, pt says she will think about it.     [name] S 08/22/2015, 1:49 PM

## 2015-08-22 NOTE — Care Management Important Message (Signed)
Important Message  Patient Details  Name: [name] MRN: [id number] Date of Birth: Jul 07, 1934   Medicare Important Message Given:  Yes    Alvie Heidelberg, RN 08/22/2015, 8:36 AM

## 2015-08-23 LAB — BASIC METABOLIC PANEL
Anion gap: 12 (ref 5–15)
BUN: 112 mg/dL — AB (ref 6–20)
CHLORIDE: 99 mmol/L — AB (ref 101–111)
CO2: 23 mmol/L (ref 22–32)
CREATININE: 6.63 mg/dL — AB (ref 0.44–1.00)
Calcium: 6.9 mg/dL — ABNORMAL LOW (ref 8.9–10.3)
GFR calc Af Amer: 6 mL/min — ABNORMAL LOW (ref >60)
GFR calc non Af Amer: 5 mL/min — ABNORMAL LOW (ref >60)
GLUCOSE: 123 mg/dL — AB (ref 65–99)
POTASSIUM: 4.2 mmol/L (ref 3.5–5.1)
SODIUM: 134 mmol/L — AB (ref 135–145)

## 2015-08-23 LAB — ALT: ALT: 19 U/L (ref 14–54)

## 2015-08-23 MED ORDER — TUBERCULIN PPD 5 UNIT/0.1ML ID SOLN
5.0000 [IU] | Freq: Once | INTRADERMAL | Status: DC
  Filled 2015-08-23: qty 0.1

## 2015-08-23 MED ORDER — EPOETIN ALFA 4000 UNIT/ML IJ SOLN
4000.0000 [IU] | INTRAMUSCULAR | Status: DC
  Administered 2015-08-23: 4000 [IU] via SUBCUTANEOUS

## 2015-08-23 MED ORDER — MUSCLE RUB 10-15 % EX CREA
TOPICAL_CREAM | CUTANEOUS | Status: DC | PRN
Start: 2015-08-23 — End: 2015-08-23
  Filled 2015-08-23: qty 85

## 2015-08-23 MED ORDER — SODIUM CHLORIDE 0.9 % IV SOLN: 100.0000 mL | INTRAVENOUS | Status: DC | PRN

## 2015-08-23 MED ORDER — LIDOCAINE HCL (PF) 1 % IJ SOLN
INTRAMUSCULAR | Status: AC
  Administered 2015-08-23: 0.5 mL
  Filled 2015-08-23: qty 5

## 2015-08-23 MED ORDER — IPRATROPIUM-ALBUTEROL 0.5-2.5 (3) MG/3ML IN SOLN
3.0000 mL | Freq: Three times a day (TID) | RESPIRATORY_TRACT | Status: DC
  Administered 2015-08-23 – 2015-08-25 (×7): 3 mL via RESPIRATORY_TRACT
  Filled 2015-08-23 (×8): qty 3

## 2015-08-23 MED ORDER — CALCIUM ACETATE (PHOS BINDER) 667 MG PO CAPS
1334.0000 mg | ORAL_CAPSULE | Freq: Three times a day (TID) | ORAL | Status: DC
  Administered 2015-08-23 – 2015-08-25 (×6): 1334 mg via ORAL
  Filled 2015-08-23 (×7): qty 2

## 2015-08-23 MED ORDER — PENTAFLUOROPROP-TETRAFLUOROETH EX AERO
1.0000 "application " | INHALATION_SPRAY | CUTANEOUS | Status: DC | PRN
  Filled 2015-08-23: qty 30

## 2015-08-23 MED ORDER — CALCIUM CARBONATE ANTACID 500 MG PO CHEW
1.0000 | CHEWABLE_TABLET | Freq: Three times a day (TID) | ORAL | Status: DC | PRN
  Administered 2015-08-23 – 2015-08-25 (×5): 200 mg via ORAL
  Filled 2015-08-23 (×5): qty 1

## 2015-08-23 MED ORDER — METHYLPREDNISOLONE SODIUM SUCC 40 MG IJ SOLR
40.0000 mg | Freq: Two times a day (BID) | INTRAMUSCULAR | Status: DC
  Administered 2015-08-23 – 2015-08-26 (×6): 40 mg via INTRAVENOUS
  Filled 2015-08-23 (×6): qty 1

## 2015-08-23 MED ORDER — MUSCLE RUB 10-15 % EX CREA
TOPICAL_CREAM | CUTANEOUS | Status: AC
  Filled 2015-08-23: qty 85

## 2015-08-23 MED ORDER — LIDOCAINE HCL (PF) 1 % IJ SOLN: 5.0000 mL | INTRAMUSCULAR | Status: DC | PRN

## 2015-08-23 MED ORDER — EPOETIN ALFA 4000 UNIT/ML IJ SOLN
INTRAMUSCULAR | Status: AC
  Administered 2015-08-23: 4000 [IU] via SUBCUTANEOUS
  Filled 2015-08-23: qty 1

## 2015-08-23 MED ORDER — LIDOCAINE-PRILOCAINE 2.5-2.5 % EX CREA: 1.0000 "application " | TOPICAL_CREAM | CUTANEOUS | Status: DC | PRN

## 2015-08-23 NOTE — Procedures (Signed)
   INITIAL HEMODIALYSIS TREATMENT NOTE:  First ever 2 hour heparin-free dialysis completed via left upper arm AVF (17g ante/retrograde). Minimal infiltration upon initial cannulation.  Needle removed, pressure and ice pack applied.  AVF re-cannulated ( by V.Grandville Silos, RN) and tx completed.  High venous pressures at low blood flow rates (150mHg/Qb200).  Goal met: 1 liter removed without interruption in ultrafiltration. All blood was returned and hemostasis was achieved within 15 minutes. Report given to LErnie Hew RN.  ARockwell Alexandria RN, CDN

## 2015-08-23 NOTE — Progress Notes (Signed)
PROGRESS NOTE  [name] [id number] DOB: [birth year] DOA: 09/08/2015 PCP: Sherrie Mustache, MD  Brief Narrative: 81 year old woman with complex medical history and recent 14 day hospitalization at [facility], discharge 6/1 with discharge diagnoses of acute on chronic kidney disease, acute on chronic diastolic heart failure, acute hypoxic respiratory failure with pulmonary infiltrates of unclear etiology, treated with empiric steroids and recommendation for outpatient follow-up; presented 6/5 with increasing shortness of breath, at that time requiring BiPAP and felt to be an exacerbation of  Assessment/Plan: 1. Acute on chronic kidney disease stage V. Pt has opted for HD. First session today. Nephrology following. 2. Acute hypoxic respiratory failure secondary to pulmonary edema superimposed on suspected interstitial lung disease. Continues to improve. No evidence of pneumonia. Initially required BiPAP, but has weaned down to HFNC. Continue to wean as tolerated.  3. Acute on chronic diastolic congestive heart failure. Appears stable at this point. LVEF 45-50 percent by echo May 2017. Per pulm note "per cardiology Procardia can interfere with hypoxic pulmonary pulmonary vasoconstriction and potentially contribute V/Q mismatch and increased oxygen dependency in patients with lung disease. 4. Possible interstitial lung disease. Seen by pulmonology earlier this month, etiology uncertain, differential broad including autoimmune disease, pulmonary fibrosis, BOOP, viral pneumonitis, pulmonary edema. Rotavirus was positive on recent respiratory panel. As of 6/1 pulmonology recommended home oxygen 3 L at rest, 4 L with exertion, Pulmicort, Brovana, pred 40 mg/d x 5 days, then 30 mg/d x 5 days then 20 mg/d x 5 days, then 10 mg/d until f/u with pulmonary, outpatient follow-up June 14 1:30pm with Dr. Corrie Dandy with repeat CT chest at the end of the month. Bronchoscopy with BAL and lung biopsy being considered as an  outpatient. 5. Coronary artery disease. Stable. Continue aspirin, statin. 6. Chronic anemia. Stable.    Overall appears stable. Plan for hemodialysis today.  Heart failure. Stable at this point.  Continue steroids.  DVT prophylaxis: SCDs Code Status: Full Family Communication: No family bedside.  Disposition Plan: Anticipate discharge in 2-3 days.   Murray Hodgkins, MD  [facility] Direct contact: [phone] --Via amion app OR  --[email or web address]; password [email or web address]  7PM-7AM contact night coverage as above 08/23/2015, 7:06 AM  LOS: 3 days   Consultants:  Nephrology   Procedures:  HD 6/8  Antimicrobials:  None  HPI/Subjective: Feeling good. Denies nausea, vomiting, or any pain. Was able to sleep without difficulty.   Objective: Filed Vitals:   08/23/15 0300 08/23/15 0400 08/23/15 0500 08/23/15 0600  BP: 134/74 125/75 117/70 135/71  Pulse: 83 78 77 78  Temp:  97.1 F (36.2 C)    TempSrc:  Oral    Resp: 24 24 20 20   Height:      Weight:   59.3 kg (130 lb 11.7 oz)   SpO2: 97% 97% 98% 98%    Intake/Output Summary (Last 24 hours) at 08/23/15 0706 Last data filed at 08/23/15 0600  Gross per 24 hour  Intake    644 ml  Output    800 ml  Net   -156 ml     Filed Weights   08/21/15 0500 08/22/15 0500 08/23/15 0500  Weight: 59 kg (130 lb 1.1 oz) 59.5 kg (131 lb 2.8 oz) 59.3 kg (130 lb 11.7 oz)    Exam: Constitutional:  . Appears calm and comfortable Respiratory:  . Some inspiratory crackles on the right. No wheezing.  Marland Kitchen Respiratory effort normal. No retractions or accessory muscle use Cardiovascular:  . RRR, no m/r/g . No LE  extremity edema   . Telemetry SR Psychiatric:  . judgement and insight appear normal . Mental status o Mood, affect appropriate  I have personally reviewed following labs and imaging studies:  BUN up to 112, creatinine up to 6.63.  Scheduled Meds: . arformoterol  15 mcg Nebulization BID  . aspirin EC  81 mg Oral Daily  .  budesonide  0.5 mg Nebulization BID  . calcitRIOL  0.25 mcg Oral Daily  . citalopram  20 mg Oral Daily  . [START ON 09/22/2015] cyanocobalamin  1,000 mcg Intramuscular Q30 days  . fluticasone  2 spray Each Nare Daily  . furosemide  120 mg Intravenous BID  . heparin  5,000 Units Subcutaneous Q8H  . ipratropium-albuterol  3 mL Nebulization QID  . levothyroxine  50 mcg Oral QAC breakfast  . methylPREDNISolone (SOLU-MEDROL) injection  40 mg Intravenous Q8H  . pantoprazole  40 mg Oral Daily  . pravastatin  80 mg Oral QHS  . senna-docusate  1 tablet Oral Daily  . sodium chloride flush  3 mL Intravenous Q12H  . sodium chloride flush  3 mL Intravenous Q12H   Continuous Infusions:   Principal Problem:   Acute renal failure superimposed on stage 4 chronic kidney disease (HCC) Active Problems:   ILD (interstitial lung disease) ([place])   Interstitial lung disease (HCC)   Acute on chronic diastolic CHF (congestive heart failure) ([place])   LOS: 3 days   Time spent 25 minutes  By signing my name below, I, [name] attest that this documentation has been prepared under the direction and in the presence of Murray Hodgkins, MD Electronically signed: Rennis [name]  08/23/2015 9:39am  I personally performed the services described in this documentation. All medical record entries made by the scribe were at my direction. I have reviewed the chart and agree that the record reflects my personal performance and is accurate and complete. Murray Hodgkins, MD

## 2015-08-23 NOTE — Progress Notes (Addendum)
Subjective: Interval History: has no complaint of nausea or vomiting. Patient also denies any chest pain. No difficulty breathing.  Objective: Vital signs in last 24 hours: Temp:  [97.1 F (36.2 C)-98.9 F (37.2 C)] 97.3 F (36.3 C) (06/08 0802) Pulse Rate:  [76-87] 77 (06/08 0730) Resp:  [11-30] 25 (06/08 0730) BP: (107-143)/(62-77) 143/76 mmHg (06/08 0700) SpO2:  [92 %-99 %] 94 % (06/08 0730) Weight:  [59.3 kg (130 lb 11.7 oz)] 59.3 kg (130 lb 11.7 oz) (06/08 0500) Weight change: -0.2 kg (-7.1 oz)  Intake/Output from previous day: 06/07 0701 - 06/08 0700 In: 644 [P.O.:420; I.V.:100; IV Piggyback:124] Out: 800 [Urine:800] Intake/Output this shift: Total I/O In: 62 [IV Piggyback:62] Out: -   General appearance: alert, cooperative and no distress Resp: diminished breath sounds bilaterally Cardio: regular rate and rhythm, S1, S2 normal, no murmur, click, rub or gallop GI: soft, non-tender; bowel sounds normal; no masses,  no organomegaly Extremities: No edema  Lab Results:  Recent Labs  09/13/2015 1306 08/21/15 0506  WBC 20.6* 9.0  HGB 10.3* 8.9*  HCT 33.0* 28.3*  PLT 338 314   BMET:  Recent Labs  08/21/15 0506 08/22/15 0437  NA 140 136  K 4.6 3.8  CL 106 101  CO2 18* 23  GLUCOSE 133* 136*  BUN 85* 98*  CREATININE 5.53* 6.24*  CALCIUM 7.7* 7.1*   No results for input(s): PTH in the last 72 hours. Iron Studies:  Recent Labs  08/22/15 0437  IRON 40  TIBC 190*  FERRITIN 646*    Studies/Results: No results found.  I have reviewed the patient's current medications.  Assessment/Plan: Problem #1 renal failure chronic : Stage V Her BUN and creatinine continued to increase. Her potassium is good. Problem #2 difficulty breathing: Possibly a combination of fluid overload/interstitial lung disease. Presently she is feeling somewhat better. Problem #3 metabolic bone disease: Her calcium is low but phosphorus is high Problem #4 anemia: Her hemoglobin is below  our target goal Problem #5 history of abdominal aortic aneurysm Problem #6 metabolic acidosis: Her CO2 is better.  Plan: 1] Will start on PhosLo 667 mg 2 tablets by mouth 3 times a day with meals 2] will check PPD, hepatitis B surface antigen, hepatitis B antibody, ALT and hepatitis C antibody. 3] will start patient on Epogen 4000 units IV after each dialysis 4] will dialyze patient again possibly for 3 hours.    LOS: 3 days   Romulus Hanrahan S 08/23/2015,8:29 AM

## 2015-08-24 LAB — HEPATITIS C ANTIBODY

## 2015-08-24 LAB — BASIC METABOLIC PANEL
Anion gap: 11 (ref 5–15)
BUN: 76 mg/dL — AB (ref 6–20)
CALCIUM: 7.7 mg/dL — AB (ref 8.9–10.3)
CO2: 26 mmol/L (ref 22–32)
Chloride: 98 mmol/L — ABNORMAL LOW (ref 101–111)
Creatinine, Ser: 4.87 mg/dL — ABNORMAL HIGH (ref 0.44–1.00)
GFR calc Af Amer: 9 mL/min — ABNORMAL LOW (ref >60)
GFR, EST NON AFRICAN AMERICAN: 8 mL/min — AB (ref >60)
GLUCOSE: 125 mg/dL — AB (ref 65–99)
Potassium: 4.5 mmol/L (ref 3.5–5.1)
Sodium: 135 mmol/L (ref 135–145)

## 2015-08-24 LAB — CBC
HCT: 30.8 % — ABNORMAL LOW (ref 36.0–46.0)
Hemoglobin: 9.9 g/dL — ABNORMAL LOW (ref 12.0–15.0)
MCH: 30.3 pg (ref 26.0–34.0)
MCHC: 32.1 g/dL (ref 30.0–36.0)
MCV: 94.2 fL (ref 78.0–100.0)
PLATELETS: 284 10*3/uL (ref 150–400)
RBC: 3.27 MIL/uL — ABNORMAL LOW (ref 3.87–5.11)
RDW: 16.7 % — AB (ref 11.5–15.5)
WBC: 11.8 10*3/uL — AB (ref 4.0–10.5)

## 2015-08-24 LAB — HEPATITIS B CORE ANTIBODY, IGM: HEP B C IGM: NEGATIVE

## 2015-08-24 LAB — HEPATITIS B SURFACE ANTIGEN: HEP B S AG: NEGATIVE

## 2015-08-24 MED ORDER — HEPARIN SODIUM (PORCINE) 1000 UNIT/ML IJ SOLN
INTRAMUSCULAR | Status: AC
  Administered 2015-08-24: 3100 [IU]
  Filled 2015-08-24: qty 6

## 2015-08-24 MED ORDER — SODIUM CHLORIDE 0.9 % IV SOLN: 100.0000 mL | INTRAVENOUS | Status: DC | PRN

## 2015-08-24 MED ORDER — SODIUM CHLORIDE 0.9% FLUSH
INTRAVENOUS | Status: AC
  Administered 2015-08-24: 10 mL
  Filled 2015-08-24: qty 100

## 2015-08-24 NOTE — Procedures (Signed)
   HEMODIALYSIS TREATMENT NOTE:  Unsuccessful cannulation of left upper arm AVF (two failed attempts on venous end).  Temporary right femoral catheter placed by Dr. Rosana Hoes.  3 hour dialysis session completed @ Qb 250-300 and VP 230-250.  Circuit clotted at end of treatment; unable to return venous circuit, EBL 250cc.  Goal nearly met: 1.8 liters removed.  Report given to Vista Deck, RN.  Rockwell Alexandria, RN, CDN

## 2015-08-24 NOTE — Progress Notes (Signed)
Subjective: Interval History: Patient offers no complaint.Her appetite is good and no nausea or vomting  Objective: Vital signs in last 24 hours: Temp:  [97.4 F (36.3 C)-98 F (36.7 C)] 97.5 F (36.4 C) (06/09 0519) Pulse Rate:  [27-99] 73 (06/09 0519) Resp:  [20-29] 20 (06/09 0519) BP: (121-156)/(64-86) 144/78 mmHg (06/09 0519) SpO2:  [91 %-100 %] 97 % (06/09 0519) Weight:  [59 kg (130 lb 1.1 oz)-59.4 kg (130 lb 15.3 oz)] 59 kg (130 lb 1.1 oz) (06/09 0425) Weight change: 0.1 kg (3.5 oz)  Intake/Output from previous day: 06/08 0701 - 06/09 0700 In: 469 [P.O.:345; IV Piggyback:124] Out: 1475 [Urine:1475] Intake/Output this shift:    Generally patient is alert and in no apparent distress X Chest is clear to auscultation Heart exam revealed regular rate and rhythm no murmur nor S3 Extremities no edema  Lab Results:  Recent Labs  08/24/15 0410  WBC 11.8*  HGB 9.9*  HCT 30.8*  PLT 284   BMET:   Recent Labs  08/22/15 0437 08/23/15 0807  NA 136 134*  K 3.8 4.2  CL 101 99*  CO2 23 23  GLUCOSE 136* 123*  BUN 98* 112*  CREATININE 6.24* 6.63*  CALCIUM 7.1* 6.9*   No results for input(s): PTH in the last 72 hours. Iron Studies:   Recent Labs  08/22/15 0437  IRON 40  TIBC 190*  FERRITIN 646*    Studies/Results: No results found.  I have reviewed the patient's current medications.  Assessment/Plan: Problem #1 End-stage renal disease: She is status post short dialysis yesterday. Presently patient is asymptomatic. Problem #2 difficulty breathing: Possibly a combination of fluid overload/interstitial lung disease. Patient denies any difficulty in breathing. Problem #3 Metabolic bone disease: Her calcium is low but phosphorus is high. Patient presently started on PhosLo. Her calcium is still seems to be low. Problem #4 anemia: Her hemoglobin is below our target goal. Patient is started on Epogen. Problem #5 history of abdominal aortic aneurysm Problem #6  metabolic acidosis: Her CO2 has corrected. Plan: 1] Will dialyze patient for 3 hours today. 2] since patient is followed by Dr. Florene Glen arrangement for outpatient dialysis need to be made to for [facility] in [place]   LOS: 4 days   [facility] S 08/24/2015,8:08 AM

## 2015-08-24 NOTE — Progress Notes (Signed)
PROGRESS NOTE  [name] [id number] DOB: 1934/07/12 DOA: 08/29/2015 PCP: Sherrie Mustache, MD  Brief Narrative: 81 year old woman with complex medical history and recent 14 day hospitalization at [facility], discharge 6/1 with discharge diagnoses of acute on chronic kidney disease, acute on chronic diastolic heart failure, acute hypoxic respiratory failure with pulmonary infiltrates of unclear etiology, treated with empiric steroids and recommendation for outpatient follow-up; presented 6/5 with increasing shortness of breath, at that time requiring BiPAP and felt to be an exacerbation of  Assessment/Plan: 1. Acute on chronic kidney disease stage V. Initial HD during hospitalization. 2. Acute hypoxic respiratory failure secondary to pulmonary edema superimposed on suspected interstitial lung disease. Slowly improving. No evidence of pneumonia. Initially required BiPAP, but has weaned down to HFNC.  3. Acute on chronic diastolic congestive heart failure. Stable. LVEF 45-50 percent by echo May 2017. Per pulm note "per cardiology Procardia can interfere with hypoxic pulmonary pulmonary vasoconstriction and potentially contribute V/Q mismatch and increased oxygen dependency in patients with lung disease. 4. Possible interstitial lung disease. Seen by pulmonology earlier this month, etiology uncertain, differential broad including autoimmune disease, pulmonary fibrosis, BOOP, viral pneumonitis, pulmonary edema. Rotavirus was positive on recent respiratory panel. As of 6/1 pulmonology recommended home oxygen 3 L at rest, 4 L with exertion, Pulmicort, Brovana, pred 40 mg/d x 5 days, then 30 mg/d x 5 days then 20 mg/d x 5 days, then 10 mg/d until f/u with pulmonary, outpatient follow-up June 14 1:30pm with Dr. Corrie Dandy with repeat CT chest at the end of the month. Bronchoscopy with BAL and lung biopsy being considered as an outpatient. 5. Coronary artery disease. Stable. Continue aspirin,  statin. 6. Chronic anemia. Stable.    Continue management of ESRD per nephrology.   Overall improving. Continue steroids, oxygen.  DVT prophylaxis: SCDs Code Status: Full Family Communication: No family bedside.  Disposition Plan: Anticipate discharge in 1-2 days.   Murray Hodgkins, MD  [facility] Direct contact: [phone] --Via amion app OR  --[email or web address]; password [email or web address]  7PM-7AM contact night coverage as above 08/24/2015, 7:40 AM  LOS: 4 days   Consultants:  Nephrology   Procedures:  HD  Antimicrobials:  None  HPI/Subjective: Overall feeling okay. Breathing okay.  Objective: Filed Vitals:   08/23/15 2023 08/23/15 2346 08/24/15 0425 08/24/15 0519  BP:  156/77  144/78  Pulse:  78  73  Temp:  97.4 F (36.3 C)  97.5 F (36.4 C)  TempSrc:  Oral  Oral  Resp:    20  Height:      Weight:   59 kg (130 lb 1.1 oz)   SpO2: 94% 94%  97%    Intake/Output Summary (Last 24 hours) at 08/24/15 0740 Last data filed at 08/24/15 0630  Gross per 24 hour  Intake    469 ml  Output   1475 ml  Net  -1006 ml     Filed Weights   08/23/15 0500 08/23/15 1215 08/24/15 0425  Weight: 59.3 kg (130 lb 11.7 oz) 59.4 kg (130 lb 15.3 oz) 59 kg (130 lb 1.1 oz)    Exam: Constitutional:  . Appears calm and comfortable Respiratory:  . Overall fair air movement. No rhonchi, wheezes or rales. Marland Kitchen Respiratory effort normal. No retractions or accessory muscle use Cardiovascular:  . RRR, no m/r/g . No LE extremity edema   Psychiatric:  . Mental status o Mood, affect appropriate  I have personally reviewed following labs and imaging studies:  WBC 11.8  Hgb 9.9  BMP noted  Scheduled Meds: . arformoterol  15 mcg Nebulization BID  . aspirin EC  81 mg Oral Daily  . budesonide  0.5 mg Nebulization BID  . calcitRIOL  0.25 mcg Oral Daily  . calcium acetate  1,334 mg Oral TID WC  . citalopram  20 mg Oral Daily  . [START ON 09/22/2015] cyanocobalamin  1,000 mcg  Intramuscular Q30 days  . epoetin (EPOGEN/PROCRIT) injection  4,000 Units Subcutaneous Q T,Th,Sa-HD  . fluticasone  2 spray Each Nare Daily  . furosemide  120 mg Intravenous BID  . heparin  5,000 Units Subcutaneous Q8H  . ipratropium-albuterol  3 mL Nebulization TID  . levothyroxine  50 mcg Oral QAC breakfast  . methylPREDNISolone (SOLU-MEDROL) injection  40 mg Intravenous Q12H  . pantoprazole  40 mg Oral Daily  . pravastatin  80 mg Oral QHS  . senna-docusate  1 tablet Oral Daily  . sodium chloride flush  3 mL Intravenous Q12H  . sodium chloride flush  3 mL Intravenous Q12H  . tuberculin  5 Units Intradermal Once   Continuous Infusions:   Principal Problem:   Acute renal failure superimposed on stage 4 chronic kidney disease (HCC) Active Problems:   ILD (interstitial lung disease) (HCC)   Interstitial lung disease (HCC)   Acute on chronic diastolic CHF (congestive heart failure) ([place])   LOS: 4 days   Time spent 20 minutes  By signing my name below, I, Rennis Harding attest that this documentation has been prepared under the direction and in the presence of Murray Hodgkins, MD Electronically signed: Rennis Harding  08/24/2015   I personally performed the services described in this documentation. All medical record entries made by the scribe were at my direction. I have reviewed the chart and agree that the record reflects my personal performance and is accurate and complete. Murray Hodgkins, MD

## 2015-08-24 NOTE — Consult Note (Signed)
SURGICAL CONSULTATION NOTE (initial)  HISTORY OF PRESENT ILLNESS (HPI):  81 y.o. female presented with increasing shortness of breath attributed to AKI/CKD, acute exacerbation of chronic diastolic CHF and acute hypoxic respiratory failure with pulmonary infiltrates of unclear etiology following recent 14-day hospitalization at [facility] in [place]. Patient underwent first short (2 hours) hemodialysis session yesterday with low flow rates and high venous pressures via Left brachial-cephalic AV fistula created at [facility] in [place]. Today, hemodialysis RN was unable to access the fistula for the second (venous) cannula with subsequent bruising + hematoma, and surgical evaluation and management was requested.  PAST MEDICAL HISTORY (PMH):  Past Medical History  Diagnosis Date  . CAD (coronary artery disease)     50% proximal LAD stenosis, 50% marginal stenosis of the circumflex, RCA 95% stenosis, EF 40% stenosis with hypokinesis of the interior wall.  She had a Taxus stent placed in 04/16/2006  . Hypertension   . Abdominal aortic aneurysm ([place])   . Renal artery stenosis (HCC)     S/P stenting of the right renal atrophic left renal  . Dyslipidemia   . Hypothyroidism   . Tobacco user     Previous  . Adenomatous polyp 2004, 2011  . Hyperplastic colon polyp 2011  . Hemorrhoids   . Diverticulosis   . Renal insufficiency   . Anemia   . Myocardial infarction ([place])     stent 2008  . Constipation   . Cataract   . AAA (abdominal aortic aneurysm) ([place])      PAST SURGICAL HISTORY ([place]):  Past Surgical History  Procedure Laterality Date  . Abdominal hysterectomy    . Carpal tunnel release Bilateral   . Cholecystectomy    . Flexible sigmoidoscopy  02/25/2012    Procedure: FLEXIBLE SIGMOIDOSCOPY;  Surgeon: Inda Castle, MD;  Location: [facility] ENDOSCOPY;  Service: Endoscopy;  Laterality: N/A;  . Hemorrhoid banding  02/25/2012    Procedure: HEMORRHOID BANDING;  Surgeon: Inda Castle, MD;  Location: [facility] ENDOSCOPY;  Service: Endoscopy;  Laterality: N/A;  . Abdominal angiogram  12/21/2012    AAA  . Abdominal aortic endovascular stent graft N/A 12/22/2012    Procedure: ABDOMINAL AORTIC ENDOVASCULAR STENT GRAFT- GORE;  Surgeon: Mal Misty, MD;  Location: [place];  Service: Vascular;  Laterality: N/A;  Ultrasound guided  . Abdominal angiogram N/A 11/30/2012    Procedure: ABDOMINAL ANGIOGRAM;  Surgeon: Serafina Mitchell, MD;  Location: [facility] CATH LAB;  Service: Cardiovascular;  Laterality: N/A;  . Cataract extraction, bilateral    . Colonoscopy    . Polypectomy    . Cardiac catheterization  2008  . Av fistula placement Left 04/20/2015    Procedure: CREATION OF LEFT UPPER ARM BRACHIOCEPHALIC ARTERIOVENOUS (AV) FISTULA  ;  Surgeon: Mal Misty, MD;  Location: [place];  Service: Vascular;  Laterality: Left;  . Cardiac catheterization N/A 08/08/2015    Procedure: Right Heart Cath;  Surgeon: Jolaine Artist, MD;  Location: [place] CV LAB;  Service: Cardiovascular;  Laterality: N/A;     MEDICATIONS:  Prior to Admission medications   Medication Sig Start Date End Date Taking? Authorizing Provider  arformoterol (BROVANA) 15 MCG/2ML NEBU Take 2 mLs (15 mcg total) by nebulization 2 (two) times daily. 08/16/15  Yes Theodis Blaze, MD  aspirin EC 81 MG tablet Take 81 mg by mouth daily.   Yes Historical Provider, MD  bisacodyl (BISACODYL) 5 MG EC tablet Take 5 mg by mouth daily as needed for  moderate constipation.   Yes Historical Provider, MD  budesonide (PULMICORT) 0.5 MG/2ML nebulizer solution Take 2 mLs (0.5 mg total) by nebulization 2 (two) times daily. 08/16/15  Yes Theodis Blaze, MD  calcitRIOL (ROCALTROL) 0.25 MCG capsule Take 1 capsule (0.25 mcg total) by mouth daily. 08/16/15  Yes Theodis Blaze, MD  citalopram (CELEXA) 20 MG tablet Take 20 mg by mouth daily.  12/16/10  Yes Historical Provider, MD  cyanocobalamin (,VITAMIN B-12,) 1000 MCG/ML injection Inject 1,000 mcg into the  muscle every 30 (thirty) days.    Yes Historical Provider, MD  diphenhydramine-acetaminophen (ACETAMINOPHEN PM) 25-500 MG TABS tablet Take 1 tablet by mouth at bedtime as needed.   Yes Historical Provider, MD  furosemide (LASIX) 80 MG tablet Take 1 tablet (80 mg total) by mouth daily. *MUST MAKE APPOINTMENT* 08/16/15  Yes Theodis Blaze, MD  ipratropium-albuterol (DUONEB) 0.5-2.5 (3) MG/3ML SOLN Take 3 mLs by nebulization 4 (four) times daily - after meals and at bedtime. 08/16/15  Yes Theodis Blaze, MD  levothyroxine (SYNTHROID, LEVOTHROID) 50 MCG tablet Take 50 mcg by mouth daily.    Yes Historical Provider, MD  meclizine (ANTIVERT) 25 MG tablet Take 1 tablet (25 mg total) by mouth 3 (three) times daily as needed for dizziness. 08/14/14  Yes Veryl Speak, MD  NIFEdipine (PROCARDIA XL/ADALAT-CC) 60 MG 24 hr tablet TAKE 1 TABLET (60 MG TOTAL) BY MOUTH DAILY. PATIENT NEEDS TO CONTACT OFFICE FOR ADDITIONAL REFILLS 07/12/15  Yes Minus Breeding, MD  nitroGLYCERIN (NITROSTAT) 0.4 MG SL tablet Place 0.4 mg under the tongue every 5 (five) minutes as needed for chest pain.   Yes Historical Provider, MD  omeprazole (PRILOSEC) 20 MG capsule Take 20 mg by mouth daily as needed (For heartburn or acid reflux.).  07/13/13  Yes Historical Provider, MD  pravastatin (PRAVACHOL) 80 MG tablet Take 80 mg by mouth at bedtime.  03/29/12  Yes Historical Provider, MD  predniSONE (DELTASONE) 20 MG tablet Take 40 mg tablet for 5 days, after that continue with 30 mg tablet once daily for 5 days, followed by 20 mg tablet daily for 5 days, 10 mg tablet for 5 days and ensure follow up with pulmonologist 08/16/15  Yes Theodis Blaze, MD  senna-docusate (SENOKOT-S) 8.6-50 MG tablet Take 1 tablet by mouth daily.   Yes Historical Provider, MD  triamcinolone (NASACORT ALLERGY 24HR) 55 MCG/ACT AERO nasal inhaler Place 2 sprays into the nose daily.   Yes Historical Provider, MD  HYDROcodone-acetaminophen (NORCO/VICODIN) 5-325 MG tablet Take 1 tablet  by mouth every 6 (six) hours as needed for moderate pain. Patient not taking: Reported on 08/27/2015 08/14/15   Theodis Blaze, MD  Linaclotide [facility]) 145 MCG CAPS capsule Take 1 capsule (145 mcg total) by mouth daily. Patient not taking: Reported on 08/30/2015 06/07/15   Milus Banister, MD  traMADol (ULTRAM) 50 MG tablet Take 1 tablet (50 mg total) by mouth every 6 (six) hours as needed. Patient not taking: Reported on 08/29/2015 04/20/15   Alvia Grove, PA-C  traZODone (DESYREL) 50 MG tablet Take 0.5 tablets (25 mg total) by mouth at bedtime as needed for sleep. Patient not taking: Reported on 09/04/2015 08/14/15   Theodis Blaze, MD     ALLERGIES:  Allergies  Allergen Reactions  . Codeine Rash  . Sulfonamide Derivatives Hives     SOCIAL HISTORY:  Social History   Social History  . Marital Status: Married    Spouse Name: N/A  . Number  of Children: 4  . Years of Education: N/A   Occupational History  . Works at a [employer] in [place]  . Smoking status: Former Smoker -- 1 years    Types: Cigarettes    Quit date: 03/17/2006  . Smokeless tobacco: Never Used  . Alcohol Use: No  . Drug Use: No  . Sexual Activity: Not on file   Other Topics Concern  . Not on file   Social History Narrative    The patient currently resides (home / rehab facility / nursing home): Home with husband The patient normally is (ambulatory / bedbound): Ambulatory   FAMILY HISTORY:  Family History  Problem Relation Age of Onset  . Uterine cancer Mother   . Heart attack Father 30  . Heart disease Father     before age 39  . Peripheral vascular disease Sister   . Other Sister     Renal artery stenosis  . Heart disease Sister     before age 39  . Heart attack Brother 54  . Heart disease Brother     before age 40  . Lung cancer Brother     \  . Colon cancer Neg Hx   . Breast cancer Maternal Aunt     x2   REVIEW OF SYSTEMS:   Constitutional: denies weight loss, fever, chills, or sweats  Eyes: denies any other vision changes, history of eye injury  ENT: denies sore throat, hearing problems  Respiratory: shortness of breath as per HPI above  Cardiovascular: denies chest pain, palpitations  Gastrointestinal: denies abdominal pain, N/V, or diarrhea  Musculoskeletal: denies any other joint pains or cramps  Skin: denies any other rashes or skin discolorations  Neurological: denies any other headache, dizziness, weakness  Psychiatric: denies any other depression, anxiety   All other review of systems were negative   VITAL SIGNS:  Temp:  [97.4 F (36.3 C)-97.5 F (36.4 C)] 97.5 F (36.4 C) (06/09 0519) Pulse Rate:  [73-81] 75 (06/09 0819) Resp:  [20-28] 20 (06/09 0819) BP: (135-156)/(77-80) 144/78 mmHg (06/09 0519) SpO2:  [91 %-97 %] 95 % (06/09 0819) Weight:  [59 kg (130 lb 1.1 oz)] 59 kg (130 lb 1.1 oz) (06/09 0425)     Height: 5\' 3"  (160 cm) Weight: 59 kg (130 lb 1.1 oz) BMI (Calculated): 22.9   INTAKE/OUTPUT:  This shift: Total I/O In: 126 [P.O.:120; I.V.:6] Out: -   Last 2 shifts: @IOLAST2SHIFTS @   PHYSICAL EXAM:  Constitutional:  -- Normal body habitus  -- Awake, alert, and oriented x3  Eyes:  -- Pupils equally round and reactive to light  -- No scleral icterus  Ear, nose, and throat:  -- No jugular venous distension  Pulmonary:  -- Equally decreased breath sounds bilaterally  Cardiovascular:  -- S1, S2 present  -- No pericardial rubs Abdomen:  -- Soft, nontender, nondistended, no guarding/rebound  -- No abdominal masses appreciated, pulsatile or otherwise  Musculoskeletal / Integumentary:  -- Wounds or skin discoloration: None  -- Extremities: B/L UE and LE FROM, hands and feet warm  Neurologic:  -- Motor function: intact and symmetric -- Sensation: intact and symmetric  Pulse/Doppler Exam: (p=palpable; d=doppler signals; 0=none)     Left   Brach  p   Rad  p  AVF easily  palpable thrill, non-pulsatile close to the brachial-cephalic anastomosis distal to the antecubital crease, becoming somewhat weaker and pulsatile in the mid-arm and then  again non-pulsatile in the proximal arm  Labs:  CBC:  Lab Results  Component Value Date   WBC 11.8* 08/24/2015   RBC 3.27* 08/24/2015   RBC 2.39* 08/01/2015   BMP:  Lab Results  Component Value Date   GLUCOSE 125* 08/24/2015   CO2 26 08/24/2015   BUN 76* 08/24/2015   CREATININE 4.87* 08/24/2015   CALCIUM 7.7* 08/24/2015     Imaging studies:  Chest X-ray (08/25/2015) Cardiomegaly persists. The hila and mediastinum are unchanged. Bilateral pulmonary opacities persist. These were better evaluated on the CT scan from Aug 08, 2015.  Assessment/Plan:  81 y.o. female with ESRD and difficulty accessing LUE brachial-cephalic AV fistula, complicated by pertinent comorbidities including acute exacerbation of chronic diastolic CHF, acute hypoxic respiratory failure with pulmonary infiltrates of unclear etiology, CAD, hypertension, and hyperlipidemia.   - check Left brachial-cephalic AV fistula duplex   - Left femoral vein non-tunneled hemodialysis catheter placed for immediate use 6/9 and 6/12  - will need durable outpatient dialysis access, accessible/functioning AVF vs tunneled catheter prior to discharge  - DVT prophylaxis, medical management of co-morbidities as per medical team  All of the above findings and recommendations were discussed with the patient and her daughter, and all of her and family's questions were answered to their expressed satisfaction.  Thank you for the opportunity to participate in the care for this patient.  -- [name], [place]: [place] [facility] V[facility] Surgery Office: [phone]

## 2015-08-24 NOTE — Care Management Important Message (Signed)
Important Message  Patient Details  Name: NASHAWN PRUM MRN: [id number] Date of Birth: 07/03/1934   Medicare Important Message Given:  Yes    Sherald Barge, RN 08/24/2015, 8:06 AM

## 2015-08-24 NOTE — Clinical Social Work Note (Signed)
CSW faxed referral to [facility] to initiate outpatient dialysis chair. CSW included request from pt for Monday, Wednesday, Friday 2nd shift if available.   [name], [place]

## 2015-08-24 NOTE — Procedures (Signed)
   HEMODIALYSIS NURSING NOTE:  Pt in HD unit for several hours this morning, on O2 at 10 lpm via HFNC with spO2 in 90s.    Rockwell Alexandria, RN, CDN

## 2015-08-24 NOTE — Progress Notes (Signed)
2204 Received call from central telemetry reporting that patient's O2 SAT had dropped to 79%. Upon observation of the patient, patient noted on the bed pan having a BM, O2 83% on 8L [place], O2 increased to 10L and O2 SAT increased to 87%, RT notified. O2 increased to 10.5L  and O2 SAT now 92%. Patient denies any SOB or dyspnea at this time. Lungs sounds noted to be clear and diminished in lower lungs. MD notified.

## 2015-08-24 NOTE — Progress Notes (Signed)
Patient went Dialysis , patient on 10 lpm HFNC , came back placed on 2 lpm HFNC-- saturation 79, Hopefully she was not 2 lpm in dialysis , she was upon return placed on 2lpm .

## 2015-08-24 NOTE — Procedures (Signed)
SURGICAL PROCEDURE REPORT  DATE OF PROCEDURE: 08/24/2015   ATTENDING SURGEON: Corene Cornea E. Rosana Hoes, MD  ANESTHESIA: Local  PRE-PROCEDURE DIAGNOSIS: End-stage renal disease  POST-PROCEDURE DIAGNOSIS: End-stage renal disease  PROCEDURE(S): Insertion of Right femoral vein non-tunneled temporary hemodialysis catheter using ultrasound guidance   INTRAOPERATIVE FINDINGS: Non-pulsatile venous blood return with clear visualization of venous access vessel on ultrasound   ESTIMATED BLOOD LOSS: Minimal (<20 mL)   SPECIMENS: None   IMPLANTS: Non-tunneled temporary hemodialysis catheter to Right common femoral vein  DRAINS: None   COMPLICATIONS: None apparent   CONDITION AT COMPLETION: Unchanged from pre-procedure   DISPOSITION: Remaining in hemodialysis unit   INDICATIONS FOR PROCEDURE:  Patient is a 81 y.o. female with ESRD requiring hemodialysis. All risks, benefits, and alternatives to above elective procedure were discussed with the patient and her family, who together elected to proceed, and informed consent was accordingly obtained at that time.   DETAILS OF PROCEDURE:  Patient was appropriately identified, and informed consent was confirmed to be obtained and documented in the patient's chart. Patient was placed in supine position, and access site was prepped and draped in the usual sterile fashion using chlorhexidine. Access site was identified using direct ultrasound visualization, and 1% lidocaine was injected for local anesthesia. Using standard Seldinger technique, access needle was inserted into the selected access vein with negative pressure applied to the attached syringe, and dark non-pulsatile venous blood was obtained. Loosely attached syringe was disconnected from the access needle, through which guidewire was advanced, over which access needle was withdrawn and dilator was advanced to skin level. A small skin incision was made adjacent to the guidewire/dilator, allowing dilator  to be advanced through subcutaneous tissues and withdrawn over secured guidewire with gentle pressure applied to the access site. Non-tunneled hemodialysis catheter, already flushed with sterile saline was then easily advanced over the guidewire, confirmed to flush easily, and secured to the patient's skin with 2-0 silk suture. Sterile dressing was applied, and all sharp instruments were appropriately disposed. Hemodialysis RN was instructed it is okay to use the non-tunneled hemodialysis catheter.   Patient tolerated the procedure well.

## 2015-08-24 NOTE — Clinical Social Work Note (Signed)
Clinical Social Work Assessment  Patient Details  Name: [name] MRN: [id number] Date of Birth: 1935/02/03  Date of referral:  08/24/15               Reason for consult:   (New Dialysis)                Permission sought to share information with:    Permission granted to share information::     Name::        Agency::     Relationship::     Contact Information:     Housing/Transportation Living arrangements for the past 2 months:  Single Family Home Source of Information:  Patient, Adult Children Patient Interpreter Needed:  None Criminal Activity/Legal Involvement Pertinent to Current Situation/Hospitalization:  No - Comment as needed Significant Relationships:  Adult Children, Spouse Lives with:  Spouse Do you feel safe going back to the place where you live?  Yes Need for family participation in patient care:  Yes (Comment)  Care giving concerns:  None identified. At baseline, patient is very independent.    Social Worker assessment / plan:  Patient state that she lives in the home with her husband, is independent with ambulating and ADLs.  She stated that she also drives.  CSW discussed new ongoing dialysis with patient. While patient stated "I don't like it," she was accepting of the need.  Patient advised that she wanted [facility] to be her ongoing facility as Dr. Florene Glen is her nephrologist.  She stated that she preferred the MWF, second shift schedule. CSW advised that she would notify the facility of her preference but it would be largely based on chair availability at the facility. Patient's daughter, Helene Kelp, was at bedside.   Employment status:  Retired Nurse, adult PT Recommendations:  Not assessed at this time Information / Referral to community resources:     Patient/Family's Response to care:  Patient is agreeable to start dialysis.   Patient/Family's Understanding of and Emotional Response to Diagnosis, Current Treatment, and  Prognosis:  Patient is disappointed to have to start chronic dialysis, but accepting.   Emotional Assessment Appearance:  Appears stated age Attitude/Demeanor/Rapport:   (Cooperative) Affect (typically observed):  Accepting (Cooperative and pleasant) Orientation:  Oriented to Self, Oriented to Place, Oriented to  Time, Oriented to Situation Alcohol / Substance use:  Not Applicable Psych involvement (Current and /or in the community):  No (Comment)  Discharge Needs  Concerns to be addressed:   (New dialysis start up) Readmission within the last 30 days:  No Current discharge risk:  None Barriers to Discharge:  No Barriers Identified   Ihor Gully, LCSW 08/24/2015, 12:54 PM

## 2015-08-25 DIAGNOSIS — N186 End stage renal disease: Secondary | ICD-10-CM

## 2015-08-25 DIAGNOSIS — Z992 Dependence on renal dialysis: Secondary | ICD-10-CM

## 2015-08-25 LAB — RENAL FUNCTION PANEL
ALBUMIN: 2.6 g/dL — AB (ref 3.5–5.0)
ANION GAP: 11 (ref 5–15)
BUN: 73 mg/dL — ABNORMAL HIGH (ref 6–20)
CALCIUM: 7.7 mg/dL — AB (ref 8.9–10.3)
CO2: 25 mmol/L (ref 22–32)
CREATININE: 4.58 mg/dL — AB (ref 0.44–1.00)
Chloride: 98 mmol/L — ABNORMAL LOW (ref 101–111)
GFR, EST AFRICAN AMERICAN: 10 mL/min — AB (ref >60)
GFR, EST NON AFRICAN AMERICAN: 8 mL/min — AB (ref >60)
Glucose, Bld: 120 mg/dL — ABNORMAL HIGH (ref 65–99)
PHOSPHORUS: 7.4 mg/dL — AB (ref 2.5–4.6)
Potassium: 4.9 mmol/L (ref 3.5–5.1)
SODIUM: 134 mmol/L — AB (ref 135–145)

## 2015-08-25 LAB — CBC
HCT: 29.6 % — ABNORMAL LOW (ref 36.0–46.0)
HEMOGLOBIN: 9.9 g/dL — AB (ref 12.0–15.0)
MCH: 31.4 pg (ref 26.0–34.0)
MCHC: 33.4 g/dL (ref 30.0–36.0)
MCV: 94 fL (ref 78.0–100.0)
PLATELETS: 200 10*3/uL (ref 150–400)
RBC: 3.15 MIL/uL — AB (ref 3.87–5.11)
RDW: 16.6 % — ABNORMAL HIGH (ref 11.5–15.5)
WBC: 9.7 10*3/uL (ref 4.0–10.5)

## 2015-08-25 LAB — HEPATITIS B SURFACE ANTIBODY,QUALITATIVE: HEP B S AB: NONREACTIVE

## 2015-08-25 LAB — HEPATITIS B CORE ANTIBODY, TOTAL: HEP B C TOTAL AB: NEGATIVE

## 2015-08-25 MED ORDER — IPRATROPIUM-ALBUTEROL 0.5-2.5 (3) MG/3ML IN SOLN: 3.0000 mL | Freq: Four times a day (QID) | RESPIRATORY_TRACT | Status: DC | PRN

## 2015-08-25 NOTE — Progress Notes (Signed)
CM able to fax information to [facility] for review and processing.

## 2015-08-25 NOTE — Progress Notes (Signed)
Subjective: Interval History: Patient denies any difficulty breathing. She denies any orthopnea or paroxysmal nocturnal dyspnea. Presently her appetite is good.  Objective: Vital signs in last 24 hours: Temp:  [97.7 F (36.5 C)-97.8 F (36.6 C)] 97.8 F (36.6 C) (06/10 0607) Pulse Rate:  [75-89] 80 (06/10 0607) Resp:  [16-20] 20 (06/10 0607) BP: (117-164)/(60-91) 145/78 mmHg (06/10 0607) SpO2:  [79 %-100 %] 92 % (06/10 0727) Weight:  [59.3 kg (130 lb 11.7 oz)] 59.3 kg (130 lb 11.7 oz) (06/09 1445) Weight change: -0.1 kg (-3.5 oz)  Intake/Output from previous day: 06/09 0701 - 06/10 0700 In: 486 [P.O.:480; I.V.:6] Out: 750 [Urine:750] Intake/Output this shift:    Generally patient is alert and in no apparent distress dialysis  Chest: auscultation Heart exam revealed regular rate and rhythm no murmur nor S3 Extremities no edema  Lab Results:  Recent Labs  08/24/15 0410 08/25/15 0547  WBC 11.8* 9.7  HGB 9.9* 9.9*  HCT 30.8* 29.6*  PLT 284 200   BMET:   Recent Labs  08/24/15 0410 08/25/15 0547  NA 135 134*  K 4.5 4.9  CL 98* 98*  CO2 26 25  GLUCOSE 125* 120*  BUN 76* 73*  CREATININE 4.87* 4.58*  CALCIUM 7.7* 7.7*   No results for input(s): PTH in the last 72 hours. Iron Studies:  No results for input(s): IRON, TIBC, TRANSFERRIN, FERRITIN in the last 72 hours.  Studies/Results: No results found.  I have reviewed the patient's current medications.  Assessment/Plan: Problem #1 End-stage renal disease: She is status post short dialysis yesterday And the day before yesterday. Presently patient is asymptomatic.  Problem #2 difficulty breathing: Possibly a combination of fluid overload/interstitial lung disease. Patient denies any difficulty in breathing. where able to remove about 1800 mL with dialysis. However patient remained hypoxic even though she is asymptomatic.  Problem #3 Metabolic bone disease: Her calcium is low but phosphorus is high. Patient  presently started on PhosLo.  Problem #4 anemia: Her hemoglobin is below our target goal. Patient is started on Epogen. Problem #5 history of abdominal aortic aneurysm Problem #6 metabolic acidosis: Her CO2 has corrected. Plan: 1] Will dialyze patient for 31/2 tomorrow. 2] will check her renal panel and CBC in the morning. 3] will try to remove 3 liters tomorrow if her blood pressure tolerates.   LOS: 5 days   [name] S 08/25/2015,8:16 AM

## 2015-08-25 NOTE — Progress Notes (Signed)
PROGRESS NOTE  [name] [id number] DOB: 1934-06-13 DOA: 09/08/2015 PCP: Sherrie Mustache, MD  Brief Narrative: 81 year old woman with complex medical history and recent 14 day hospitalization at [facility], discharge 6/1 with discharge diagnoses of acute on chronic kidney disease, acute on chronic diastolic heart failure, acute hypoxic respiratory failure with pulmonary infiltrates of unclear etiology, treated with empiric steroids and recommendation for outpatient follow-up; presented 6/5 with increasing shortness of breath, at that time requiring BiPAP and felt to be an exacerbation of  Assessment/Plan: 1. Acute renal failure superimposed on chronic kidney disease stage V with initiation of hemodialysis this hospitalization. Management per nephrology. 2. Acute hypoxic respiratory failure, multifactorial. Predominantly interstitial lung disease with component of pulmonary edema (resolved). Initially required BiPAP, remained stable and high flow nasal cannula. 3. Acute on chronic diastolic congestive heart failure, acute component resolved. LVEF 45-50 percent by echo May 2017. Procardia discontinued per notation as can contribute to V/Q mismatch. 4. Suspected interstitial lung disease. Seen by pulmonology earlier this month, etiology uncertain, differential broad including autoimmune disease, pulmonary fibrosis, BOOP, viral pneumonitis, pulmonary edema. Rotavirus was positive on recent respiratory panel. As of 6/1 pulmonology recommended home oxygen 3 L at rest, 4 L with exertion, Pulmicort, Brovana, prednisone taper. Has outpatient follow-up June 14 1:30pm with Dr. Corrie Dandy with repeat CT chest at the end of the month. Bronchoscopy with BAL and lung biopsy being considered as an outpatient. 5. Coronary artery disease. Aspirin, statin. 6. Anemia of chronic kidney disease, stable.   Overall appears stable. Continue dialysis as per nephrology.  Continue steroids, high flow nasal cannula, wean  as tolerated. Keep outpatient follow-up with pulmonology.  DVT prophylaxis: SCDs Code Status: Full Family Communication: No family bedside.  Disposition Plan: Anticipate discharge in 1-2 days.   Murray Hodgkins, MD  [facility] Direct contact: [phone] --Via amion app OR  --[email or web address]; password [email or web address]  7PM-7AM contact night coverage as above 08/25/2015, 11:11 AM  LOS: 5 days   Consultants:  Nephrology   General surgery  Procedures:  HD  Antimicrobials:  None  HPI/Subjective: Feels okay. Breathing okay. No pain.  Objective: Filed Vitals:   08/24/15 2321 08/25/15 0224 08/25/15 0607 08/25/15 0727  BP:   145/78   Pulse: 89 89 80   Temp:   97.8 F (36.6 C)   TempSrc:   Oral   Resp: 20 20 20    Height:      Weight:      SpO2: 90% 99% 100% 92%    Intake/Output Summary (Last 24 hours) at 08/25/15 1111 Last data filed at 08/25/15 1045  Gross per 24 hour  Intake    480 ml  Output    750 ml  Net   -270 ml     Filed Weights   08/23/15 1215 08/24/15 0425 08/24/15 1445  Weight: 59.4 kg (130 lb 15.3 oz) 59 kg (130 lb 1.1 oz) 59.3 kg (130 lb 11.7 oz)    Exam: Constitutional:  . Appears calm and comfortable, lying flat in bed Respiratory:  . CTA bilaterally, no w/r/r.  . Respiratory effort normal. No retractions or accessory muscle use Cardiovascular:  . RRR, no m/r/g . No LE extremity edema   . Telemetry SR, brief SVT Psychiatric:  . judgement and insight appear normal . Mental status o Mood, affect appropriate  I have personally reviewed following labs and imaging studies:  Basic metabolic panel noted, potassium within normal limits  Hemoglobin stable 9.9, normal WBC  Scheduled Meds: . arformoterol  15  mcg Nebulization BID  . aspirin EC  81 mg Oral Daily  . budesonide  0.5 mg Nebulization BID  . calcitRIOL  0.25 mcg Oral Daily  . calcium acetate  1,334 mg Oral TID WC  . citalopram  20 mg Oral Daily  . [START ON 09/22/2015]  cyanocobalamin  1,000 mcg Intramuscular Q30 days  . epoetin (EPOGEN/PROCRIT) injection  4,000 Units Subcutaneous Q T,Th,Sa-HD  . fluticasone  2 spray Each Nare Daily  . heparin  5,000 Units Subcutaneous Q8H  . ipratropium-albuterol  3 mL Nebulization TID  . levothyroxine  50 mcg Oral QAC breakfast  . methylPREDNISolone (SOLU-MEDROL) injection  40 mg Intravenous Q12H  . pantoprazole  40 mg Oral Daily  . pravastatin  80 mg Oral QHS  . senna-docusate  1 tablet Oral Daily  . sodium chloride flush  3 mL Intravenous Q12H  . sodium chloride flush  3 mL Intravenous Q12H  . tuberculin  5 Units Intradermal Once   Continuous Infusions:   Principal Problem:   Acute renal failure superimposed on stage 4 chronic kidney disease (HCC) Active Problems:   ILD (interstitial lung disease) (HCC)   Interstitial lung disease (HCC)   Acute on chronic diastolic CHF (congestive heart failure) ([place])   LOS: 5 days   Time spent 15 minutes

## 2015-08-25 NOTE — Progress Notes (Signed)
2123 Discussed renal diet and fluid restrictions with patient regarding what foods/fluids to avoid and to eat/drink d/t being on dialysis and Hx of renal disease. Patient provided with reading material that gives patient a guideline on the types of foods/drinks to consume while trying to avoid excessive intake of potassium, sodium and phosphorus containing food/drinks.

## 2015-08-25 NOTE — Progress Notes (Deleted)
CM faxed information for [facility] services for review and processing.  Patient pending D/C.

## 2015-08-26 LAB — CBC
HEMATOCRIT: 29.7 % — AB (ref 36.0–46.0)
Hemoglobin: 9.8 g/dL — ABNORMAL LOW (ref 12.0–15.0)
MCH: 30.9 pg (ref 26.0–34.0)
MCHC: 33 g/dL (ref 30.0–36.0)
MCV: 93.7 fL (ref 78.0–100.0)
Platelets: 200 10*3/uL (ref 150–400)
RBC: 3.17 MIL/uL — ABNORMAL LOW (ref 3.87–5.11)
RDW: 16.4 % — AB (ref 11.5–15.5)
WBC: 9.4 10*3/uL (ref 4.0–10.5)

## 2015-08-26 LAB — RENAL FUNCTION PANEL
Albumin: 2.6 g/dL — ABNORMAL LOW (ref 3.5–5.0)
Anion gap: 13 (ref 5–15)
BUN: 94 mg/dL — AB (ref 6–20)
CHLORIDE: 97 mmol/L — AB (ref 101–111)
CO2: 24 mmol/L (ref 22–32)
Calcium: 8.3 mg/dL — ABNORMAL LOW (ref 8.9–10.3)
Creatinine, Ser: 5.57 mg/dL — ABNORMAL HIGH (ref 0.44–1.00)
GFR calc Af Amer: 8 mL/min — ABNORMAL LOW (ref >60)
GFR calc non Af Amer: 7 mL/min — ABNORMAL LOW (ref >60)
GLUCOSE: 118 mg/dL — AB (ref 65–99)
POTASSIUM: 4.9 mmol/L (ref 3.5–5.1)
Phosphorus: 8.5 mg/dL — ABNORMAL HIGH (ref 2.5–4.6)
Sodium: 134 mmol/L — ABNORMAL LOW (ref 135–145)

## 2015-08-26 MED ORDER — SODIUM CHLORIDE 0.9 % IV SOLN: 100.0000 mL | INTRAVENOUS | Status: DC | PRN

## 2015-08-26 MED ORDER — EPOETIN ALFA 10000 UNIT/ML IJ SOLN
6000.0000 [IU] | INTRAMUSCULAR | Status: DC
  Administered 2015-08-28 – 2015-08-31 (×2): 6000 [IU] via INTRAVENOUS

## 2015-08-26 MED ORDER — EPOETIN ALFA 10000 UNIT/ML IJ SOLN: 6000.0000 [IU] | INTRAMUSCULAR | Status: DC

## 2015-08-26 MED ORDER — ALTEPLASE 2 MG IJ SOLR
2.0000 mg | Freq: Once | INTRAMUSCULAR | Status: DC | PRN
  Filled 2015-08-26: qty 2

## 2015-08-26 MED ORDER — FAMOTIDINE 20 MG PO TABS
20.0000 mg | ORAL_TABLET | Freq: Two times a day (BID) | ORAL | Status: DC
  Administered 2015-08-26 – 2015-08-29 (×8): 20 mg via ORAL
  Filled 2015-08-26 (×8): qty 1

## 2015-08-26 MED ORDER — HEPARIN SODIUM (PORCINE) 1000 UNIT/ML IJ SOLN
INTRAMUSCULAR | Status: AC
  Administered 2015-08-26: 3100 [IU] via INTRAVENOUS_CENTRAL
  Filled 2015-08-26: qty 6

## 2015-08-26 MED ORDER — CALCIUM ACETATE (PHOS BINDER) 667 MG PO CAPS
2001.0000 mg | ORAL_CAPSULE | Freq: Three times a day (TID) | ORAL | Status: DC
  Administered 2015-08-26 – 2015-09-02 (×18): 2001 mg via ORAL
  Filled 2015-08-26 (×19): qty 3

## 2015-08-26 MED ORDER — METHYLPREDNISOLONE SODIUM SUCC 40 MG IJ SOLR
40.0000 mg | Freq: Every day | INTRAMUSCULAR | Status: DC
  Administered 2015-08-27 – 2015-08-29 (×3): 40 mg via INTRAVENOUS
  Filled 2015-08-26 (×3): qty 1

## 2015-08-26 MED ORDER — HEPARIN SODIUM (PORCINE) 1000 UNIT/ML DIALYSIS
1000.0000 [IU] | INTRAMUSCULAR | Status: DC | PRN
  Administered 2015-08-26 – 2015-08-28 (×2): 3100 [IU] via INTRAVENOUS_CENTRAL
  Administered 2015-08-31: 3200 [IU] via INTRAVENOUS_CENTRAL
  Filled 2015-08-26 (×4): qty 1

## 2015-08-26 NOTE — Progress Notes (Addendum)
Subjective: Interval History: Patient feels ok . Denies any difficult in breathinng  Objective: Vital signs in last 24 hours: Temp:  [97.6 F (36.4 C)-98.9 F (37.2 C)] 97.6 F (36.4 C) (06/11 0543) Pulse Rate:  [77-81] 77 (06/11 0543) Resp:  [20] 20 (06/11 0543) BP: (143-155)/(74-81) 149/75 mmHg (06/11 0543) SpO2:  [92 %-97 %] 97 % (06/11 0736) Weight:  [54.568 kg (120 lb 4.8 oz)] 54.568 kg (120 lb 4.8 oz) (06/11 0543) Weight change: -4.732 kg (-10 lb 6.9 oz)  Intake/Output from previous day: 06/10 0701 - 06/11 0700 In: 800 [P.O.:600; I.V.:120] Out: 150 [Urine:150] Intake/Output this shift:    Generally patient is alert and in no apparent distress dialysis  Chest: auscultation Heart exam revealed regular rate and rhythm no murmur nor S3 Extremities no edema  Lab Results:  Recent Labs  08/25/15 0547 08/26/15 0609  WBC 9.7 9.4  HGB 9.9* 9.8*  HCT 29.6* 29.7*  PLT 200 200   BMET:   Recent Labs  08/25/15 0547 08/26/15 0609  NA 134* 134*  K 4.9 4.9  CL 98* 97*  CO2 25 24  GLUCOSE 120* 118*  BUN 73* 94*  CREATININE 4.58* 5.57*  CALCIUM 7.7* 8.3*   No results for input(s): PTH in the last 72 hours. Iron Studies:  No results for input(s): IRON, TIBC, TRANSFERRIN, FERRITIN in the last 72 hours.  Studies/Results: No results found.  I have reviewed the patient's current medications.  Assessment/Plan: Problem #1 End-stage renal disease: She is status post hemodialysis on Friday ad remains asymptotmatic Problem #2 difficulty breathing: Possibly a combination of fluid overload/interstitial lung disease. Patient denies any difficulty in breathing. Patient remains on nasal Oxygen Problem #3 Metabolic bone disease: Her calcium has normalized but her phosphorus remains high. Patient presently started on PhosLo.  Problem #4 anemia: Her hemoglobin is below our target goal. Patient is started on Epogen. Her hemoglobin is stable Problem #5 history of abdominal aortic  aneurysm Problem #6 metabolic acidosis: Her CO2 has corrected. Plan: 1] Will dialyze patient for 31/2 tomorrow. 2] will check her renal panel and CBC in the morning. 3] will try to remove 21/2liters tomorrow if her blood pressure tolerates. 4] Increase Phos;o 667 mg 3 tablets po tid with meals 5] Increase Epogen to 6000 u iv after each dialysis   LOS: 6 days   Kinlee Garrison S 08/26/2015,8:36 AM

## 2015-08-26 NOTE — Progress Notes (Signed)
PROGRESS NOTE  [name] [id number] DOB: [birth year] DOA: 08/30/2015 PCP: Sherrie Mustache, MD  Brief Narrative: 81 year old woman with complex medical history and recent 14 day hospitalization at [facility], discharge 6/1 with discharge diagnoses of acute on chronic kidney disease, acute on chronic diastolic heart failure, acute hypoxic respiratory failure with pulmonary infiltrates of unclear etiology, treated with empiric steroids and recommendation for outpatient follow-up; presented 6/5 with increasing shortness of breath, at that time requiring BiPAP and felt to be an exacerbation of  Assessment/Plan: 1. Acute renal failure superimposed on chronic kidney disease stage V with initiation of hemodialysis this hospitalization. Remains stable. 2. Acute hypoxic respiratory failure, multifactorial. Predominantly interstitial lung disease with component of pulmonary edema (resolved). Initially required BiPAP, remains stable on high flow nasal cannula. Wean as tolerated. 3. Acute on chronic diastolic congestive heart failure, acute component resolved. LVEF 45-50 percent by echo May 2017. Procardia discontinued per notation as can contribute to V/Q mismatch. 4. Suspected interstitial lung disease. Seen by pulmonology earlier this month, etiology uncertain, differential broad including autoimmune disease, pulmonary fibrosis, BOOP, viral pneumonitis, pulmonary edema. Rotavirus was positive on recent respiratory panel. As of 6/1 pulmonology recommended home oxygen 3 L at rest, 4 L with exertion, Pulmicort, Brovana, prednisone taper. Has outpatient follow-up June 14 1:30pm with Dr. Corrie Dandy with repeat CT chest at the end of the month. Bronchoscopy with BAL and lung biopsy being considered as an outpatient. 5. Coronary artery disease. Continue aspirin, statin. 6. Anemia of chronic kidney disease, remains stable.   Overall appears stable. Continue dialysis as per nephrology. Will d/c foley catheter today.    Continue steroids, nebulizers, high flow nasal cannula, wean as tolerated. Keep outpatient follow-up with pulmonology.  DVT prophylaxis: SCDs Code Status: Full Family Communication: No family bedside.  Disposition Plan: Anticipate discharge next 48 hours  Murray Hodgkins, MD  [facility] Direct contact: [phone] --Via [place]  --[email or web address]; password [email or web address]  7PM-7AM contact night coverage as above 08/26/2015, 7:11 AM  LOS: 6 days   Consultants:  Nephrology   General surgery  Procedures:  HD  Antimicrobials:  None  HPI/Subjective: Slept well. Still having some SOB and a mild cough but denies any pain, nausea, or vomiting. Has a decreased appetite. Last BM yesterday.   Objective: Filed Vitals:   08/25/15 2014 08/25/15 2115 08/25/15 2140 08/26/15 0543  BP:   143/81 149/75  Pulse:   81 77  Temp:   98.9 F (37.2 C) 97.6 F (36.4 C)  TempSrc:   Oral Oral  Resp:   20 20  Height:      Weight:    54.568 kg (120 lb 4.8 oz)  SpO2: 95% 93% 97% 92%    Intake/Output Summary (Last 24 hours) at 08/26/15 0711 Last data filed at 08/25/15 1836  Gross per 24 hour  Intake    800 ml  Output    150 ml  Net    650 ml     Filed Weights   08/24/15 0425 08/24/15 1445 08/26/15 0543  Weight: 59 kg (130 lb 1.1 oz) 59.3 kg (130 lb 11.7 oz) 54.568 kg (120 lb 4.8 oz)    Exam: Constitutional:  . Appears calm and comfortable  Respiratory:  . CTA bilaterally, no w/r/r.  . Respiratory effort normal. Good air movement. No retractions or accessory muscle use Cardiovascular:  . RRR, no m/r/g . No LE extremity edema   Musculoskeletal  Moves all extremities to command Abdomen  Soft and non-tender Psychiatric:  .  judgement and insight appear normal . Mental status o Mood, affect appropriate  I have personally reviewed following labs and imaging studies:  Basic metabolic panel noted, potassium within normal limits  Hemoglobin stable 9.8, normal  WBC  Scheduled Meds: . arformoterol  15 mcg Nebulization BID  . aspirin EC  81 mg Oral Daily  . budesonide  0.5 mg Nebulization BID  . calcitRIOL  0.25 mcg Oral Daily  . calcium acetate  1,334 mg Oral TID WC  . citalopram  20 mg Oral Daily  . [START ON 09/22/2015] cyanocobalamin  1,000 mcg Intramuscular Q30 days  . epoetin (EPOGEN/PROCRIT) injection  4,000 Units Subcutaneous Q T,Th,Sa-HD  . fluticasone  2 spray Each Nare Daily  . heparin  5,000 Units Subcutaneous Q8H  . levothyroxine  50 mcg Oral QAC breakfast  . methylPREDNISolone (SOLU-MEDROL) injection  40 mg Intravenous Q12H  . pantoprazole  40 mg Oral Daily  . pravastatin  80 mg Oral QHS  . senna-docusate  1 tablet Oral Daily  . sodium chloride flush  3 mL Intravenous Q12H  . sodium chloride flush  3 mL Intravenous Q12H  . tuberculin  5 Units Intradermal Once   Continuous Infusions:   Principal Problem:   Acute renal failure superimposed on stage 4 chronic kidney disease (HCC) Active Problems:   ILD (interstitial lung disease) (HCC)   Interstitial lung disease (HCC)   Acute on chronic diastolic CHF (congestive heart failure) ([place])   ESRD on dialysis ([place])   LOS: 6 days   Time spent 15 minutes  By signing my name below, I, Rosalie Doctor attest that this documentation has been prepared under the direction and in the presence of Murray Hodgkins, MD Electronically signed: Rosalie Doctor, Scribe. 08/26/2015 9:19am   I personally performed the services described in this documentation. All medical record entries made by the scribe were at my direction. I have reviewed the chart and agree that the record reflects my personal performance and is accurate and complete. [place] Hodgkins, MD

## 2015-08-26 NOTE — Procedures (Signed)
   HEMODIALYSIS TREATMENT NOTE:  3.5 hour heparin-free dialysis completed via right femoral temporary catheter. Exit site unremarkable. Goal NOT met: Unable to tolerate removal of 2.5 liters as ordered. Ultrafiltration was suspended or UF rate was decreased to minimum for over half of her HD session due to hypotension.  Net UF 1 liter.  Pt withdrawn, reports no appetite.  Food suggestions "give me indigestion."  Nepro over ice was well tolerated, however.  AVF still tender and bruised with resolving hematoma.  Catheter used for this treatment, max Qb 300.  No significant blood loss.  Report given to Tedd Sias, RN.  Rockwell Alexandria, RN, CDN

## 2015-08-27 ENCOUNTER — Inpatient Hospital Stay (HOSPITAL_COMMUNITY): Payer: Medicare HMO

## 2015-08-27 NOTE — Progress Notes (Signed)
Pt mentioned she had not urinated since catheter was removed on 08/26/15 at 1800. Bladder scanned pt and it showed 380cc. Received order for In and Out catheter. Got 400cc out from In and Out cath.

## 2015-08-27 NOTE — Care Management Note (Signed)
Case Management Note  Patient Details  Name: [name] MRN: [id number] Date of Birth: 05-05-1934  Expected Discharge Date:    08/28/2015              Expected Discharge Plan:  [place]  In-House Referral:     Discharge planning Services  CM Consult  Post Acute Care Choice:    Choice offered to:     DME Arranged:    DME Agency:  [place]:    [facility] Agency:  [place]  Status of Service:  In process, will continue to follow  Medicare Important Message Given:  Yes Date Medicare IM Given:    Medicare IM give by:    Date Additional Medicare IM Given:    Additional Medicare Important Message give by:     If discussed at [place] of Stay Meetings, dates discussed:    Additional Comments: Plan continues to be for pt to return home, to daughters house, with [facility] services through [facility]. Pt now has increased O2 requirements and will need port O2 tanks and concentrator to support 8lpm N/C. [facility] made aware and need and will have new port tank delivered to pt's room prior to DC. Pt's HD has been set up at [facility] by CSW.   Sherald Barge, RN 08/27/2015, 3:02 PM

## 2015-08-27 NOTE — Evaluation (Addendum)
Physical Therapy Evaluation Patient Details Name: [name] MRN: [id number] DOB: 1934/08/15 Today's Date: 08/27/2015   History of Present Illness  81 yo F admitted 08/20/2015 with SOB and acute respiratory failure due to ILD vs CHF with EF of 45-50%, volume overload due to ESRD.  PMH: CAD, HTN, AAA and repair, Renal artery stenosis, hypothyroidism, diverticulosis, renal insufficiency, anemia, MI, carpal tunnel release, AV fistula placement  Clinical Impression  Pt received in bed and was agreeable to PT evaluation.  Pt normally is independent with ambulation, ADL's, IADL's, and driving.  Today, she is on 5L HFNC O2, and she required Min guard for supine<>sit.  Upon sitting on the EOB her SpO2 dropped from 90% at rest down to 76% with increased work of breathing noted with elevated respiratory rate.  Increased HFNC from 5 to 8LPM, and encouraged pt to perform pursed lipped breathing slowly.  She required multiple cues for expiration, but after several minutes, she was finally able to improve SpO2 to 90% on 7L HFNC in sitting.  Pt then performed sit<>stand, and stand pivot transfer with RW and Min A to the chair.  During this transfer, SpO2 dropped to 85% on 7L HFNC.  Pt positioned with pillow supporting her back in the chair, and again encouraged with purse lipped breathing, and PT was able to drop O2 down to 6L HFNC, however she was not able to maintain 90% on 5L HFNC.  RN notified that pt was left sitting up in the chair on 6L of HFNC at end of PT session.  She continues to have difficulty maintaining saturation during mobilitiy, however it is felt that she will likely be able to d/c home with 24/7 supervision/assistance, and HHPT.  She would also greatly benefit from OT to educate on energy conservation techniques during ADL's.      Follow Up Recommendations Home health PT;Supervision/Assistance - 24 hour    Equipment Recommendations  Other (comment) (home O2)    Recommendations for Other  Services OT consult     Precautions / Restrictions Precautions Precautions: Fall Precaution Comments: Monitor SpO2 at all times during mobility due to quick desaturation.  Restrictions Weight Bearing Restrictions: No      Mobility  Bed Mobility Overal bed mobility: Needs Assistance Bed Mobility: Supine to Sit     Supine to sit: Min guard;HOB elevated     General bed mobility comments: Increased time sitting at the EOB due to desaturation of SpO2 to 76% on 5L HFNC, therefore, increased up to 7LPM HFNC, and educated on slow PLB.  Increased back to 90% after ~31min  Transfers Overall transfer level: Needs assistance Equipment used: Rolling walker (2 wheeled) Transfers: Sit to/from [facility] Sit to Stand: Min assist Stand pivot transfers: Min assist          Ambulation/Gait Ambulation/Gait assistance:  (NA due to SpO2 desaturation.)              Stairs            Wheelchair Mobility    Modified Rankin (Stroke Patients Only)       Balance Overall balance assessment: Needs assistance Sitting-balance support: Bilateral upper extremity supported Sitting balance-Leahy Scale: Good     Standing balance support: Bilateral upper extremity supported Standing balance-Leahy Scale: Fair                               Pertinent Vitals/Pain Pain Assessment: No/denies pain  Home Living   Living Arrangements: Spouse/significant other Available Help at Discharge: Family;Available 24 hours/day Type of Home: House Home Access: Stairs to enter   [employer] of Steps: 1 Home Layout: One level Home Equipment: Walker - 2 wheels;Bedside commode      Prior Function Level of Independence: Independent         Comments: Pt states she was independent with ambulation, ADL's, and IADL's, driving.      Hand Dominance   Dominant Hand: Right    Extremity/Trunk Assessment   Upper Extremity Assessment: Generalized  weakness           Lower Extremity Assessment: Generalized weakness         Communication   Communication: No difficulties  Cognition Arousal/Alertness: Lethargic Behavior During Therapy: WFL for tasks assessed/performed Overall Cognitive Status: Within Functional Limits for tasks assessed                      General Comments      Exercises        Assessment/Plan    PT Assessment Patient needs continued PT services  PT Diagnosis Difficulty walking;Abnormality of gait;Generalized weakness   PT Problem List Decreased strength;Decreased activity tolerance;Decreased balance;Decreased mobility;Decreased coordination;Decreased knowledge of use of DME;Decreased safety awareness;Decreased knowledge of precautions;Cardiopulmonary status limiting activity  PT Treatment Interventions DME instruction;Gait training;Functional mobility training;Therapeutic activities;Therapeutic exercise;Balance training;Patient/family education   PT Goals (Current goals can be found in the Care Plan section) Acute Rehab PT Goals Patient Stated Goal: To go home and get stronger PT Goal Formulation: With patient Time For Goal Achievement: 09/10/15 Potential to Achieve Goals: Fair    Frequency Min 4X/week   Barriers to discharge        Co-evaluation               End of Session Equipment Utilized During Treatment: Gait belt;Oxygen Activity Tolerance: Patient limited by fatigue Patient left: in chair;with call bell/phone within reach;with family/visitor present Nurse Communication: Mobility status    Functional Assessment Tool Used: [employer] "6-clicks"  Functional Limitation: Mobility: Walking and moving around Mobility: Walking and Moving Around Current Status [phone]): At least 60 percent but less than 80 percent impaired, limited or restricted Mobility: Walking and Moving Around Goal Status [phone]): At least 40 percent but less than 60 percent impaired,  limited or restricted    Time: 1216-1254 PT Time Calculation (min) (ACUTE ONLY): 38 min   Charges:   PT Evaluation $PT Eval Moderate Complexity: 1 Procedure PT Treatments $Therapeutic Activity: 23-37 mins   PT G Codes:   PT G-Codes **NOT FOR INPATIENT CLASS** Functional Assessment Tool Used: [employer] "6-clicks"  Functional Limitation: Mobility: Walking and moving around Mobility: Walking and Moving Around Current Status [phone]): At least 60 percent but less than 80 percent impaired, limited or restricted Mobility: Walking and Moving Around Goal Status [phone]): At least 40 percent but less than 60 percent impaired, limited or restricted    Beth Granvil Djordjevic, PT, DPT X: [phone]

## 2015-08-27 NOTE — Care Management Important Message (Signed)
Important Message  Patient Details  Name: [name] MRN: [id number] Date of Birth: 1934/12/10   Medicare Important Message Given:  Yes    Sherald Barge, RN 08/27/2015, 1:55 PM

## 2015-08-27 NOTE — Progress Notes (Signed)
PROGRESS NOTE  [name] [id number] DOB: 1934-05-11 DOA: 08/22/2015 PCP: Sherrie Mustache, MD  Brief Narrative: 81 year old woman with complex medical history and recent 14 day hospitalization at [facility], discharge 6/1 with discharge diagnoses of acute on chronic kidney disease, acute on chronic diastolic heart failure, acute hypoxic respiratory failure with pulmonary infiltrates of unclear etiology, treated with empiric steroids and recommendation for outpatient follow-up; presented 6/5 with increasing shortness of breath, at that time requiring BiPAP and felt to be an exacerbation of  Assessment/Plan: 1. Acute hypoxic respiratory failure, multifactorial. At this point family interstitial lung disease with component of resolved pulmonary edema. Initially required BiPAP but remains stable on high flow nasal cannula. 2. Acute renal failure superimposed on chronic kidney disease stage V with initiation of hemodialysis this hospitalization. Remains stable, however was unable to tolerate full hemodialysis yesterday. 3. Acute on chronic diastolic heart failure, now stable. LVEF 45-50 percent by echo May 2017. Procardia discontinued per notation as can contribute to V/Q mismatch. 4. Suspected interstitial lung disease. Seen by pulmonology earlier this month, etiology uncertain, differential broad including autoimmune disease, pulmonary fibrosis, BOOP, viral pneumonitis, pulmonary edema. Rotavirus was positive on recent respiratory panel. As of 6/1 pulmonology recommended home oxygen 3 L at rest, 4 L with exertion, Pulmicort, Brovana, prednisone taper. Has outpatient follow-up June 14 1:30pm with Dr. Corrie Dandy with repeat CT chest at the end of the month. Bronchoscopy with BAL and lung biopsy being considered as an outpatient. 5. Coronary artery disease. Stable. Continue aspirin, statin. 6. Anemia of chronic kidney disease, stable.   Seems to have plateaued. No significant improvement in respiratory  status.  Continue steroids, nebulizer therapy, high flow nasal cannula. Repeat chest x-ray. We'll discuss with patient's pulmonologist for any further recommendations.  DVT prophylaxis: SCDs Code Status: Full Family Communication: No family bedside.  Disposition Plan: Anticipate discharge next 24 hours  Murray Hodgkins, MD  [facility] Direct contact: [phone] --[place]ia [place]  --[email or web address]; password [email or web address]  7PM-7AM contact night coverage as above 08/27/2015, 6:58 AM  LOS: 7 days   Consultants:  Nephrology   General surgery  Procedures:  Hemodialysis  Antimicrobials:  None  HPI/Subjective: Feels okay but somewhat short of breath. Poor appetite.   Objective: Filed Vitals:   08/26/15 2033 08/26/15 2035 08/26/15 2130 08/27/15 0421  BP:   112/69 128/79  Pulse:   92 85  Temp:   98 F (36.7 C) 98.4 F (36.9 C)  TempSrc:   Oral Oral  Resp:   20 20  Height:      Weight:    53.751 kg (118 lb 8 oz)  SpO2: 89% 89% 89% 93%    Intake/Output Summary (Last 24 hours) at 08/27/15 0658 Last data filed at 08/26/15 1816  Gross per 24 hour  Intake    360 ml  Output   1175 ml  Net   -815 ml     Filed Weights   08/26/15 0543 08/26/15 1413 08/27/15 0421  Weight: 54.568 kg (120 lb 4.8 oz) 54.7 kg (120 lb 9.5 oz) 53.751 kg (118 lb 8 oz)    Exam: Constitutional:  . Appears calm and comfortable, sitting in chair Respiratory:  . CTA bilaterally, no w/r/r.  . Respiratory effort normal. No retractions or accessory muscle use Cardiovascular:  . RRR, no m/r/g . No LE extremity edema   Abdomen:  . Abdomen appears normal; no tenderness or masses . No hernias . No HSM Psychiatric:  . judgement and insight appear normal .  Mental status o Mood, affect appropriate  I have personally reviewed following labs and imaging studies:  Potassium within normal limits.  Hemoglobin stable.  Scheduled Meds: . arformoterol  15 mcg Nebulization BID  . aspirin EC  81  mg Oral Daily  . budesonide  0.5 mg Nebulization BID  . calcitRIOL  0.25 mcg Oral Daily  . calcium acetate  2,001 mg Oral TID WC  . citalopram  20 mg Oral Daily  . [START ON 09/22/2015] cyanocobalamin  1,000 mcg Intramuscular Q30 days  . [START ON 08/28/2015] epoetin (EPOGEN/PROCRIT) injection  6,000 Units Intravenous Q T,Th,Sa-HD  . famotidine  20 mg Oral BID  . fluticasone  2 spray Each Nare Daily  . heparin  5,000 Units Subcutaneous Q8H  . levothyroxine  50 mcg Oral QAC breakfast  . methylPREDNISolone (SOLU-MEDROL) injection  40 mg Intravenous Daily  . pantoprazole  40 mg Oral Daily  . pravastatin  80 mg Oral QHS  . senna-docusate  1 tablet Oral Daily  . sodium chloride flush  3 mL Intravenous Q12H  . sodium chloride flush  3 mL Intravenous Q12H  . tuberculin  5 Units Intradermal Once   Continuous Infusions:   Principal Problem:   Acute renal failure superimposed on stage 4 chronic kidney disease (HCC) Active Problems:   ILD (interstitial lung disease) (HCC)   Interstitial lung disease (HCC)   Acute on chronic diastolic CHF (congestive heart failure) ([place])   ESRD on dialysis ([place])   LOS: 7 days   Time spent 25 minutes   By signing my name below, I, Rennis Harding attest that this documentation has been prepared under the direction and in the presence of Murray Hodgkins, MD Electronically signed: Rennis Harding 08/27/2015    I personally performed the services described in this documentation. All medical record entries made by the scribe were at my direction. I have reviewed the chart and agree that the record reflects my personal performance and is accurate and complete. Murray Hodgkins, MD

## 2015-08-27 NOTE — Progress Notes (Signed)
Subjective: Interval History: The patient claims not feeling good. Her main complaint seems to be weakness. Her breathing is slightly better. Patient does not have any nausea or vomiting.  Objective: Vital signs in last 24 hours: Temp:  [97.7 F (36.5 C)-98.4 F (36.9 C)] 98.4 F (36.9 C) (06/12 0421) Pulse Rate:  [82-93] 85 (06/12 0421) Resp:  [18-20] 20 (06/12 0421) BP: (90-152)/(44-87) 128/79 mmHg (06/12 0421) SpO2:  [89 %-100 %] 100 % (06/12 0744) Weight:  [53.751 kg (118 lb 8 oz)-54.7 kg (120 lb 9.5 oz)] 53.751 kg (118 lb 8 oz) (06/12 0421) Weight change: 0.132 kg (4.7 oz)  Intake/Output from previous day: 06/11 0701 - 06/12 0700 In: 360 [P.O.:360] Out: 1175 [Urine:150] Intake/Output this shift:    Generally patient is alert and in no apparent distress dialysis  Chest: auscultation Heart exam revealed regular rate and rhythm no murmur nor S3 Extremities no edema  Lab Results:  Recent Labs  08/25/15 0547 08/26/15 0609  WBC 9.7 9.4  HGB 9.9* 9.8*  HCT 29.6* 29.7*  PLT 200 200   BMET:   Recent Labs  08/25/15 0547 08/26/15 0609  NA 134* 134*  K 4.9 4.9  CL 98* 97*  CO2 25 24  GLUCOSE 120* 118*  BUN 73* 94*  CREATININE 4.58* 5.57*  CALCIUM 7.7* 8.3*   No results for input(s): PTH in the last 72 hours. Iron Studies:  No results for input(s): IRON, TIBC, TRANSFERRIN, FERRITIN in the last 72 hours.  Studies/Results: No results found.  I have reviewed the patient's current medications.  Assessment/Plan: Problem #1 End-stage renal disease: She is status post Hemodialysis yesterday using a femoral catheter. Her potassium is normal. Problem #2 difficulty breathing: Possibly a combination of fluid overload/interstitial lung disease. One were able to remove only 1 L yesterday because of hypotension.. Patient remains on nasal Oxygen Problem #3 Metabolic bone disease: Her calcium has normalized but her phosphorus remains high. Patient presently started on  PhosLo.  Problem #4 anemia: Her hemoglobin is below our target goal. Patient is started on Epogen. Does of Epogen has been increased. Problem #5 history of abdominal aortic aneurysm Problem #6 metabolic acidosis: Her CO2 has corrected. Plan: 1] Will dialyze patient for 31/2 tomorrow. 2] will check her renal panel and CBC in the morning. 3] will try to remove 11/2 liters tomorrow if her blood pressure tolerates. 4] will continue his Epogen at present dose 5] no need for dialysis today.    LOS: 7 days   Lettie Czarnecki S 08/27/2015,10:28 AM

## 2015-08-27 NOTE — Clinical Social Work Note (Signed)
CSW spoke with Abigail Butts at [facility] advised that patient was closer to the facility located on [address]. She requested that CSW speak to patient about distance and which facility she wished to be served.  CSW spoke with patient. Her daughter was at bedside. Patient and daughter were agreeable to utilize the [facility] facility as it was closer.  CSW contacted Abigail Butts at [facility] that patient was agreeable to the [place] location.  Reyhan Moronta, Clydene Pugh, LCSW

## 2015-08-27 NOTE — Evaluation (Signed)
  Occupational Therapy Evaluation Patient Details Name: [name] MRN: [id number] DOB: 1934/06/04 Today's Date: 08/27/2015    History of Present Illness 81 yo F admitted 08/20/2015 with SOB and acute respiratory failure due to ILD vs CHF with EF of 45-50%, volume overload due to ESRD.  PMH: CAD, HTN, AAA and repair, Renal artery stenosis, hypothyroidism, diverticulosis, renal insufficiency, anemia, MI, carpal tunnel release, AV fistula placement   Clinical Impression   Pt in chair upon therapy arrival with daughter visiting. Pt was educated on energy conservation strategies to utilize at home as discharge. Strategies and examples were given during education. No questions from daughter or patient. No OT follow needed at this time.     Follow Up Recommendations  No OT follow up    Equipment Recommendations  None recommended by OT       Precautions / Restrictions Precautions Precautions: Fall Precaution Comments: Monitor SpO2 at all times during mobility due to quick desaturation.  Restrictions Weight Bearing Restrictions: No                              Pertinent Vitals/Pain Pain Assessment: No/denies pain     Hand Dominance Right   Extremity/Trunk Assessment Upper Extremity Assessment Upper Extremity Assessment: Generalized weakness   Lower Extremity Assessment Lower Extremity Assessment: Generalized weakness       Communication Communication Communication: No difficulties   Cognition Arousal/Alertness: Awake/alert Behavior During Therapy: WFL for tasks assessed/performed Overall Cognitive Status: Within Functional Limits for tasks assessed                        Exercises   Other Exercises Other Exercises: Patient educated on Energy Conservation strategies and presented with a handout.        Home Living Family/patient expects to be discharged to:: Private residence Living Arrangements: Spouse/significant other Available Help at  Discharge: Family;Available 24 hours/day Type of Home: House Home Access: Stairs to enter [employer] of Steps: 1   Home Layout: One level     Bathroom Shower/Tub: Teacher, early years/pre: Standard Bathroom Accessibility: Yes   Home Equipment: Environmental consultant - 2 wheels;Bedside commode   Additional Comments: Pt has a reacher and a Long handled sponge      Prior Functioning/Environment Level of Independence: Independent        Comments: Pt states she was independent with ambulation, ADL's, and IADL's, driving.              OT Goals(Current goals can be found in the care plan section) Acute Rehab OT Goals Patient Stated Goal: To go home and get stronger  OT Frequency:                End of Session    Activity Tolerance: Patient tolerated treatment well Patient left: in chair;with call bell/phone within reach;with family/visitor present   Time:  -    Charges:  OT General Charges $OT Visit: 1 Procedure OT Evaluation $OT Eval Low Complexity: 1 Procedure OT Treatments $Self Care/Home Management : 8-22 mins G-Codes:    Ailene Ravel, OTR/L,CBIS  [phone]  08/27/2015, 3:44 PM

## 2015-08-27 NOTE — Progress Notes (Signed)
SATURATION QUALIFICATIONS: (This note is used to comply with regulatory documentation for home oxygen)  Patient Saturations on 5 Liters nasal canula at Rest =90%  Patient Saturations on 5 liters while Ambulating = 76%  Patient Saturations on 7 Liters of oxygen while Ambulating = 85%  Please briefly explain why patient needs home oxygen: Patient placed on 8 Liters of oxygen at rest = 90%

## 2015-08-28 ENCOUNTER — Inpatient Hospital Stay (HOSPITAL_COMMUNITY): Payer: Medicare HMO

## 2015-08-28 DIAGNOSIS — J9601 Acute respiratory failure with hypoxia: Secondary | ICD-10-CM

## 2015-08-28 MED ORDER — SODIUM CHLORIDE 0.9 % IV SOLN: 100.0000 mL | INTRAVENOUS | Status: DC | PRN

## 2015-08-28 MED ORDER — EPOETIN ALFA 10000 UNIT/ML IJ SOLN
INTRAMUSCULAR | Status: AC
  Administered 2015-08-28: 6000 [IU] via INTRAVENOUS
  Filled 2015-08-28: qty 1

## 2015-08-28 MED ORDER — NEPRO/CARBSTEADY PO LIQD
237.0000 mL | Freq: Every day | ORAL | Status: DC
  Administered 2015-08-28: 237 mL via ORAL

## 2015-08-28 MED ORDER — HEPARIN SODIUM (PORCINE) 1000 UNIT/ML IJ SOLN
INTRAMUSCULAR | Status: AC
  Administered 2015-08-28: 3100 [IU] via INTRAVENOUS_CENTRAL
  Filled 2015-08-28: qty 6

## 2015-08-28 NOTE — Progress Notes (Deleted)
Patient requested something for headache at 1136. Text-paged Dr. Marin Comment to request tylenol for headache. Donavan Foil, RN Charted under wrong patient.

## 2015-08-28 NOTE — Care Management Note (Signed)
Case Management Note  Patient Details  Name: TAKEENA HERBERG MRN: [id number] Date of Birth: [birth year]  Expected Discharge Date:    08/29/2015             Expected Discharge Plan:  [place]  In-House Referral:     Discharge planning Services  CM Consult  Post Acute Care Choice:    Choice offered to:     DME Arranged:    DME Agency:  [place]:    [facility] Agency:  [place]  Status of Service:  In process, will continue to follow  Medicare Important Message Given:  Yes Date Medicare IM Given:    Medicare IM give by:    Date Additional Medicare IM Given:    Additional Medicare Important Message give by:     If discussed at [place] of Stay Meetings, dates discussed:  08/28/2015  Additional Comments: Pt requiring addition PT prior to DC to determine if home with [facility] is still appropriate. Pt unable to complete PT eval due to resp instability. Requiring higher level of oxygen. Will cont to follow.  Sherald Barge, RN 08/28/2015, 4:39 PM

## 2015-08-28 NOTE — Progress Notes (Addendum)
PROGRESS NOTE  [name] [id number] DOB: 1934-12-28 DOA: 09/01/2015 PCP: Sherrie Mustache, MD  Brief Narrative: 81 year old woman with complex medical history and recent 14 day hospitalization at [facility], discharge 6/1 with discharge diagnoses of acute on chronic kidney disease, acute on chronic diastolic heart failure, acute hypoxic respiratory failure with pulmonary infiltrates of unclear etiology, treated with empiric steroids and recommendation for outpatient follow-up; presented to [facility] 6/5 with increasing shortness of breath, at that time requiring BiPAP. She was treated with IV steroids with gradual improvement in respiratory status although still requiring increased nasal cannula versus high flow nasal cannula. Chronic kidney disease progressed during this hospitalization end-stage renal disease and hemodialysis was initiated. At this point patient remains significantly dyspneic on exertion and hypoxic with movement preventing discharge.  Assessment/Plan: 1. Acute hypoxic respiratory failure, multifactorial. Predominantly interstitial lung disease of unclear etiology. Had a component resolved pulmonary edema. Initially required BiPAP but now stable for several days. Continues to have desaturation with movement. 2. Acute renal failure with progression to end-stage renal disease. Hemodialysis initiated this hospitalization. Having difficulty tolerating full session. 3. Acute on chronic diastolic heart failure, stable. LVEF 45-50 percent. Procardia discontinued per previous discharge summary as it can contribute V/Q mismatch. 4. Suspected interstitial lung disease. Seen by pulmonology earlier this month, etiology uncertain, differential broad including autoimmune disease, pulmonary fibrosis, BOOP, viral pneumonitis, pulmonary edema. Rotavirus was positive on recent respiratory panel. As of 6/1 pulmonology recommended home oxygen 3 L at rest, 4 L with exertion, Pulmicort, Brovana,  prednisone taper. Has outpatient follow-up June 14 1:30pm with Dr. Corrie Dandy with repeat CT chest at the end of the month. Bronchoscopy with BAL and lung biopsy being considered as an outpatient. 5. Coronary artery disease. Stable. Continue aspirin, statin. 6. Anemia of chronic kidney disease, stable.   Appears stable at this point. However respiratory status is tenuous with any exertion and patient is at high risk for rehospitalization. Disposition remains unclear given high oxygen requirement and extremely limited mobility. Continue steroids, nebulizer therapy, nasal cannula and wean as tolerated.  I discussed with patient's pulmonologist Dr. Corrie Dandy 6/12 who recommended continuing current treatment.  Continue hemodialysis management per nephrology.  Surgery will evaluate patient to determine if catheter can be removed.  DVT prophylaxis: SCDs Code Status: Full Family Communication: Discussed with patient and family at bedside. Disposition Plan: Unclear  Murray Hodgkins, MD  [facility] Direct contact: [phone] --Via amion app OR  --[email or web address]; password [email or web address]  7PM-7AM contact night coverage as above 08/28/2015, 5:33 PM  LOS: 8 days   Consultants:  Nephrology   General surgery  Procedures:  Hemodialysis 6/11  Hemodialysis 6/13  Antimicrobials:  None  HPI/Subjective: Slight appetite. Vomited yesterday, but none today. Breathing has improved.   Objective: Filed Vitals:   08/28/15 1400 08/28/15 1410 08/28/15 1516 08/28/15 1548  BP: 100/65 123/63 113/58   Pulse: 82 79 87   Temp:   98.3 F (36.8 C)   TempSrc:      Resp:      Height:      Weight:      SpO2:   91% 84%    Intake/Output Summary (Last 24 hours) at 08/28/15 1733 Last data filed at 08/28/15 1400  Gross per 24 hour  Intake    483 ml  Output    531 ml  Net    -48 ml     Filed Weights   08/27/15 0421 08/28/15 0636 08/28/15 1024  Weight: 53.751 kg (118 lb  8 oz) 53.81 kg (118 lb 10.1 oz)  53.9 kg (118 lb 13.3 oz)    Exam: General:  Appears calm and comfortable, Appears weak Cardiovascular: RRR, no m/r/g. No LE edema. Respiratory: CTA bilaterally, no w/r/r. Normal respiratory effort. Psychiatric: grossly normal mood and affect, speech fluent and appropriate  I have personally reviewed following labs and imaging studies:  No new labs  Scheduled Meds: . arformoterol  15 mcg Nebulization BID  . aspirin EC  81 mg Oral Daily  . budesonide  0.5 mg Nebulization BID  . calcitRIOL  0.25 mcg Oral Daily  . calcium acetate  2,001 mg Oral TID WC  . citalopram  20 mg Oral Daily  . [START ON 09/22/2015] cyanocobalamin  1,000 mcg Intramuscular Q30 days  . epoetin (EPOGEN/PROCRIT) injection  6,000 Units Intravenous Q T,Th,Sa-HD  . famotidine  20 mg Oral BID  . feeding supplement (NEPRO CARB STEADY)  237 mL Oral Q2000  . fluticasone  2 spray Each Nare Daily  . heparin  5,000 Units Subcutaneous Q8H  . levothyroxine  50 mcg Oral QAC breakfast  . methylPREDNISolone (SOLU-MEDROL) injection  40 mg Intravenous Daily  . pantoprazole  40 mg Oral Daily  . pravastatin  80 mg Oral QHS  . senna-docusate  1 tablet Oral Daily  . sodium chloride flush  3 mL Intravenous Q12H  . sodium chloride flush  3 mL Intravenous Q12H  . tuberculin  5 Units Intradermal Once   Continuous Infusions:   Principal Problem:   Acute renal failure superimposed on stage 4 chronic kidney disease (HCC) Active Problems:   ILD (interstitial lung disease) ([place])   Interstitial lung disease (HCC)   ESRD on dialysis ([place])   Acute respiratory failure with hypoxia (HCC)   LOS: 8 days   Time spent 20 minutes  By signing my name below, I, Delene Ruffini, attest that this documentation has been prepared under the direction and in the presence of [place]. Sarajane Jews, MD. Electronically Signed: Delene Ruffini, Scribe.  08/28/2015 2:30pm  I personally performed the services described in this documentation. All medical  record entries made by the scribe were at my direction. I have reviewed the chart and agree that the record reflects my personal performance and is accurate and complete. Murray Hodgkins, MD

## 2015-08-28 NOTE — Procedures (Signed)
   HEMODIALYSIS TREATMENT NOTE:  3.5 hour heparin-free dialysis completed via right femoral temporary catheter. Exit site unremarkable.  Goal NOT met: Unable to tolerate removal of 1.5 liters as ordered. Ultrafiltration was repeatedly interrupted for hypotension.  Pt unable to tolerate even minimal UF rate of 300cc/h.  All blood was returned.  AVF with good thrill and bruit. Arm still tender and bruised from previous infiltrations.  Spoke with Orion Modest, RN Clinic Manager at [facility] outpatient dialysis facility.  Best cannulators to be assigned to access AVF until fully arterialized and able to tolerate prescribed Qb.  Report given to Donavan Foil, RN.  [place], RN, CDN

## 2015-08-28 NOTE — Progress Notes (Signed)
PT Cancellation Note  Patient Details Name: NYTIA EVITTS MRN: [id number] DOB: 1934-08-05   Cancelled Treatment:    Reason Eval/Treat Not Completed: Patient not medically ready. Pt received on 6L via  @ 84% SaO2. Prior to activity, flow rate increased to 8L, with SaO2 still fluctuating between 87-88%. RN notified, and Pt left on 8L. Will wait until patient is more medical appropriate to attempt exertional activity.   3:50 PM, 08/28/2015 Etta Grandchild, PT, DPT PRN Physical Therapist - [place] License # [id number] [phone] (mobile)

## 2015-08-28 NOTE — Progress Notes (Signed)
Subjective: Interval History: The patient claims not feeling good. She has some nausea but no vomting. Complains of weakness and lack of energy. She states that she doesn't see significant difference with dialysis.  Objective: Vital signs in last 24 hours: Temp:  [98.2 F (36.8 C)-98.3 F (36.8 C)] 98.2 F (36.8 C) (06/13 0631) Pulse Rate:  [66-86] 72 (06/13 0631) Resp:  [18-20] 18 (06/13 0631) BP: (121-128)/(64-68) 127/66 mmHg (06/13 0631) SpO2:  [76 %-99 %] 99 % (06/13 0804) Weight:  [53.81 kg (118 lb 10.1 oz)] 53.81 kg (118 lb 10.1 oz) (06/13 0636) Weight change: -0.89 kg (-1 lb 15.4 oz)  Intake/Output from previous day: 06/12 0701 - 06/13 0700 In: 480 [P.O.:480] Out: 400 [Urine:400] Intake/Output this shift: Total I/O In: 3 [I.V.:3] Out: -   Generally patient is alert and in no apparent distress dialysis  Chest: auscultation Heart exam revealed regular rate and rhythm no murmur nor S3 Extremities no edema  Lab Results:  Recent Labs  08/26/15 0609  WBC 9.4  HGB 9.8*  HCT 29.7*  PLT 200   BMET:   Recent Labs  08/26/15 0609  NA 134*  K 4.9  CL 97*  CO2 24  GLUCOSE 118*  BUN 94*  CREATININE 5.57*  CALCIUM 8.3*   No results for input(s): PTH in the last 72 hours. Iron Studies:  No results for input(s): IRON, TIBC, TRANSFERRIN, FERRITIN in the last 72 hours.  Studies/Results: Dg Chest 2 View  08/27/2015  CLINICAL DATA:  Hypoxia and weakness EXAM: CHEST  2 VIEW COMPARISON:  08/24/2015 FINDINGS: Cardiac shadow remains enlarged. Diffuse increased interstitial changes are again identified bilaterally stable from the prior exam. No new focal infiltrate is seen. Aortic stent graft is again noted. No acute bony abnormality is seen. IMPRESSION: Stable chronic pulmonary opacities. No new focal abnormality is seen. Electronically Signed   By: Inez Catalina M.D.   On: 08/27/2015 19:15    I have reviewed the patient's current medications.  Assessment/Plan: Problem  #1 End-stage renal disease: She is status post Hemodialysis On Sunday. Presently patient is still with poor appetite and have some nausea. Her potassium is normal. Problem #2 difficulty breathing: Possibly a combination of fluid overload/interstitial lung disease. Unable to remove much fluid with dialysis because of hypotension. Problem #3 Metabolic bone disease: Her calcium has normalized but her phosphorus remains high. Patient presently is on a binder. Problem #4 anemia: Her hemoglobin is below our target goal. Patient is started on Epogen. Does of Epogen has been increased. Problem #5 history of abdominal aortic aneurysm Problem #6 metabolic acidosis: Her CO2 has corrected. Plan: 1] Will dialyze patient for 31/2 today. 2] will check her renal panel and CBC in the morning. 3] will try to remove 11/2 liters tomorrow if her blood pressure tolerates. 4] will continue his Epogen at present dose 5] patient will be going to [facility] under Dr. Florene Glen when she is discharged.    LOS: 8 days   Teale Goodgame S 08/28/2015,9:20 AM

## 2015-08-29 ENCOUNTER — Inpatient Hospital Stay: Payer: Medicare HMO | Admitting: Pulmonary Disease

## 2015-08-29 DIAGNOSIS — E43 Unspecified severe protein-calorie malnutrition: Secondary | ICD-10-CM | POA: Insufficient documentation

## 2015-08-29 DIAGNOSIS — N179 Acute kidney failure, unspecified: Principal | ICD-10-CM

## 2015-08-29 DIAGNOSIS — N184 Chronic kidney disease, stage 4 (severe): Secondary | ICD-10-CM

## 2015-08-29 LAB — CBC
HEMATOCRIT: 32.5 % — AB (ref 36.0–46.0)
HEMOGLOBIN: 10.6 g/dL — AB (ref 12.0–15.0)
MCH: 30.9 pg (ref 26.0–34.0)
MCHC: 32.6 g/dL (ref 30.0–36.0)
MCV: 94.8 fL (ref 78.0–100.0)
Platelets: 163 10*3/uL (ref 150–400)
RBC: 3.43 MIL/uL — ABNORMAL LOW (ref 3.87–5.11)
RDW: 16.9 % — ABNORMAL HIGH (ref 11.5–15.5)
WBC: 7.5 10*3/uL (ref 4.0–10.5)

## 2015-08-29 LAB — RENAL FUNCTION PANEL
ALBUMIN: 2.6 g/dL — AB (ref 3.5–5.0)
ANION GAP: 9 (ref 5–15)
BUN: 59 mg/dL — ABNORMAL HIGH (ref 6–20)
CHLORIDE: 98 mmol/L — AB (ref 101–111)
CO2: 26 mmol/L (ref 22–32)
Calcium: 8.2 mg/dL — ABNORMAL LOW (ref 8.9–10.3)
Creatinine, Ser: 4.52 mg/dL — ABNORMAL HIGH (ref 0.44–1.00)
GFR calc Af Amer: 10 mL/min — ABNORMAL LOW (ref >60)
GFR, EST NON AFRICAN AMERICAN: 8 mL/min — AB (ref >60)
Glucose, Bld: 91 mg/dL (ref 65–99)
PHOSPHORUS: 6.2 mg/dL — AB (ref 2.5–4.6)
POTASSIUM: 4.2 mmol/L (ref 3.5–5.1)
Sodium: 133 mmol/L — ABNORMAL LOW (ref 135–145)

## 2015-08-29 MED ORDER — METHYLPREDNISOLONE SODIUM SUCC 125 MG IJ SOLR
60.0000 mg | Freq: Four times a day (QID) | INTRAMUSCULAR | Status: DC
  Administered 2015-08-29 – 2015-09-01 (×10): 60 mg via INTRAVENOUS
  Filled 2015-08-29 (×10): qty 2

## 2015-08-29 MED ORDER — IPRATROPIUM-ALBUTEROL 0.5-2.5 (3) MG/3ML IN SOLN
3.0000 mL | Freq: Four times a day (QID) | RESPIRATORY_TRACT | Status: DC
  Administered 2015-08-29 – 2015-09-01 (×7): 3 mL via RESPIRATORY_TRACT
  Filled 2015-08-29 (×6): qty 3

## 2015-08-29 MED ORDER — BOOST / RESOURCE BREEZE PO LIQD
1.0000 | Freq: Two times a day (BID) | ORAL | Status: DC
  Administered 2015-08-29 – 2015-09-02 (×5): 1 via ORAL

## 2015-08-29 MED ORDER — GUAIFENESIN ER 600 MG PO TB12
1200.0000 mg | ORAL_TABLET | Freq: Two times a day (BID) | ORAL | Status: DC
  Administered 2015-08-29 – 2015-09-02 (×7): 1200 mg via ORAL
  Filled 2015-08-29 (×7): qty 2

## 2015-08-29 NOTE — NC FL2 (Signed)
[place] MEDICAID FL2 LEVEL OF CARE SCREENING TOOL     IDENTIFICATION  Patient Name: [name] Birthdate: Jan 01, 1935 Sex: female Admission Date (Current Location): 08/29/2015  [facility] and [place] Number:  [employer] and Address:  [facility] [address] [place]      Provider Number: [id number]  Attending Physician Name and Address:  Kathie Dike, MD  Relative Name and Phone Number:       Current Level of Care: Hospital Recommended Level of Care: [place] Prior Approval Number:    Date Approved/Denied:   PASRR Number:  [id number]   Discharge Plan: SNF    Current Diagnoses: Patient Active Problem List   Diagnosis Date Noted  . Protein-calorie malnutrition, severe 08/29/2015  . Acute respiratory failure with hypoxia ([place]) 08/28/2015  . ESRD on dialysis ([place]) 08/25/2015  . Interstitial lung disease ([place])   . ILD (interstitial lung disease) ([place])   . Bilateral pulmonary infiltrates on chest x-ray   . Pneumonitis   . Dyspnea   . Acute renal failure superimposed on stage 4 chronic kidney disease ([place]) 08/03/2015  . CAP (community acquired pneumonia) 08/01/2015  . Anemia 08/01/2015  . Hypoxia 08/01/2015  . Elevated troponin 08/01/2015  . Congestive heart failure ([place])   . Chronic kidney disease (CKD), stage IV (severe) ([place]) 04/17/2015  . Nausea alone 08/01/2013  . Unspecified constipation 08/01/2013  . Hypothyroidism 08/01/2013  . AAA (abdominal aortic aneurysm) without rupture ([place]) 01/25/2013  . Pre-operative cardiovascular examination 11/23/2012  . Internal hemorrhoids without mention of complication [id number]  . Atherosclerosis 04/22/2011  . Hypotension 01/02/2011  . PERSONAL HISTORY OF COLONIC POLYPS 05/28/2009  . DYSLIPIDEMIA 12/25/2008  . TOBACCO ABUSE 12/25/2008  . Coronary atherosclerosis 12/25/2008  . RENAL ARTERY STENOSIS 12/25/2008  . RENAL INSUFFICIENCY 12/25/2008    Orientation  RESPIRATION BLADDER Height & Weight     Self, Time, Situation, Place  O2 (7 liters high flow) Continent Weight: 118 lb 6.2 oz (53.7 kg) Height:  5\' 3"  (160 cm)  BEHAVIORAL SYMPTOMS/MOOD NEUROLOGICAL BOWEL NUTRITION STATUS  Other (Comment) (n/a)  (n/a) Continent Diet (Renal diet with 1200 mL fluid restriction)  AMBULATORY STATUS COMMUNICATION OF NEEDS Skin    (limited due to respiratory status) Verbally Bruising                       Personal Care Assistance Level of Assistance  Bathing, Feeding, Dressing Bathing Assistance: Limited assistance Feeding assistance: Limited assistance Dressing Assistance: Limited assistance     Functional Limitations Info  Sight, Hearing, Speech Sight Info: Impaired Hearing Info: Adequate Speech Info: Adequate    SPECIAL CARE FACTORS FREQUENCY  PT (By licensed PT)     PT Frequency: 5              Contractures Contractures Info: Not present    Additional Factors Info  Code Status, Allergies Code Status Info: Full code Allergies Info: Codeine, Sulfonamide Derivatives           Current Medications (08/29/2015):  This is the current hospital active medication list Current Facility-Administered Medications  Medication Dose Route Frequency Provider Last Rate Last Dose  . 0.9 %  sodium chloride infusion  250 mL Intravenous PRN Jani Gravel, MD 10 mL/hr at 08/23/15 0600 250 mL at 08/23/15 0600  . 0.9 %  sodium chloride infusion  100 mL Intravenous PRN Fran Lowes, MD      . 0.9 %  sodium chloride infusion  100 mL Intravenous PRN Fran Lowes, MD      . 0.9 %  sodium chloride infusion  100 mL Intravenous PRN Fran Lowes, MD      . 0.9 %  sodium chloride infusion  100 mL Intravenous PRN Fran Lowes, MD      . 0.9 %  sodium chloride infusion  100 mL Intravenous PRN Fran Lowes, MD      . 0.9 %  sodium chloride infusion  100 mL Intravenous PRN Fran Lowes, MD      . 0.9 %  sodium chloride infusion  100 mL  Intravenous PRN Fran Lowes, MD      . 0.9 %  sodium chloride infusion  100 mL Intravenous PRN Fran Lowes, MD      . acetaminophen (TYLENOL) tablet 650 mg  650 mg Oral Q6H PRN Jani Gravel, MD   650 mg at 08/27/15 1030   Or  . acetaminophen (TYLENOL) suppository 650 mg  650 mg Rectal Q6H PRN Jani Gravel, MD      . alteplase (CATHFLO ACTIVASE) injection 2 mg  2 mg Intracatheter Once PRN Fran Lowes, MD      . arformoterol (BROVANA) nebulizer solution 15 mcg  15 mcg Nebulization BID Jani Gravel, MD   15 mcg at 08/29/15 0801  . aspirin EC tablet 81 mg  81 mg Oral Daily Jani Gravel, MD   81 mg at 08/29/15 1119  . bisacodyl (DULCOLAX) EC tablet 5 mg  5 mg Oral Daily PRN Jani Gravel, MD      . budesonide (PULMICORT) nebulizer solution 0.5 mg  0.5 mg Nebulization BID Jani Gravel, MD   0.5 mg at 08/29/15 0810  . calcitRIOL (ROCALTROL) capsule 0.25 mcg  0.25 mcg Oral Daily Jani Gravel, MD   0.25 mcg at 08/29/15 1119  . calcium acetate (PHOSLO) capsule 2,001 mg  2,001 mg Oral TID WC Fran Lowes, MD   2,001 mg at 08/29/15 1119  . citalopram (CELEXA) tablet 20 mg  20 mg Oral Daily Jani Gravel, MD   20 mg at 08/29/15 1119  . [START ON 09/22/2015] cyanocobalamin ((VITAMIN B-12)) injection 1,000 mcg  1,000 mcg Intramuscular Q30 days Erline Hau, MD      . epoetin alfa (EPOGEN,PROCRIT) injection 6,000 Units  6,000 Units Intravenous Q T,Th,Sa-HD Fran Lowes, MD   6,000 Units at 08/28/15 1330  . famotidine (PEPCID) tablet 20 mg  20 mg Oral BID Samuella Cota, MD   20 mg at 08/29/15 1119  . feeding supplement (BOOST / RESOURCE BREEZE) liquid 1 Container  1 Container Oral BID BM Kathie Dike, MD   1 Container at 08/29/15 1128  . fluticasone (FLONASE) 50 MCG/ACT nasal spray 2 spray  2 spray Each Nare Daily Jani Gravel, MD   2 spray at 08/29/15 1127  . guaiFENesin (MUCINEX) 12 hr tablet 1,200 mg  1,200 mg Oral BID Kathie Dike, MD      . heparin injection 1,000 Units  1,000 Units Dialysis  PRN Fran Lowes, MD   3,100 Units at 08/28/15 1409  . heparin injection 5,000 Units  5,000 Units Subcutaneous Q8H Jani Gravel, MD   5,000 Units at 08/29/15 0518  . ipratropium-albuterol (DUONEB) 0.5-2.5 (3) MG/3ML nebulizer solution 3 mL  3 mL Nebulization Q6H Kathie Dike, MD      . levothyroxine (SYNTHROID, LEVOTHROID) tablet 50 mcg  50 mcg Oral QAC breakfast Jani Gravel, MD   50 mcg at 08/29/15 0853  . lidocaine (PF) (XYLOCAINE) 1 %  injection 5 mL  5 mL Intradermal PRN Fran Lowes, MD      . lidocaine-prilocaine (EMLA) cream 1 application  1 application Topical PRN Fran Lowes, MD      . meclizine (ANTIVERT) tablet 25 mg  25 mg Oral TID PRN Jani Gravel, MD      . methylPREDNISolone sodium succinate (SOLU-MEDROL) 125 mg/2 mL injection 60 mg  60 mg Intravenous Q6H Kathie Dike, MD      . nitroGLYCERIN (NITROSTAT) SL tablet 0.4 mg  0.4 mg Sublingual Q5 min PRN Jani Gravel, MD      . pantoprazole (PROTONIX) EC tablet 40 mg  40 mg Oral Daily Jani Gravel, MD   40 mg at 08/29/15 1118  . pentafluoroprop-tetrafluoroeth (GEBAUERS) aerosol 1 application  1 application Topical PRN Fran Lowes, MD      . pravastatin (PRAVACHOL) tablet 80 mg  80 mg Oral QHS Jani Gravel, MD   80 mg at 08/28/15 2001  . senna-docusate (Senokot-S) tablet 1 tablet  1 tablet Oral Daily Jani Gravel, MD   1 tablet at 08/29/15 1118  . sodium chloride flush (NS) 0.9 % injection 3 mL  3 mL Intravenous Q12H Jani Gravel, MD   3 mL at 08/28/15 2002  . sodium chloride flush (NS) 0.9 % injection 3 mL  3 mL Intravenous Q12H Jani Gravel, MD   3 mL at 08/29/15 1121  . sodium chloride flush (NS) 0.9 % injection 3 mL  3 mL Intravenous PRN Jani Gravel, MD      . tuberculin injection 5 Units  5 Units Intradermal Once Fran Lowes, MD      . zolpidem (AMBIEN) tablet 5 mg  5 mg Oral QHS PRN Jani Gravel, MD   5 mg at 08/28/15 2001     Discharge Medications: Please see discharge summary for a list of discharge medications.  Relevant  Imaging Results:  Relevant Lab Results:   Additional Information SS#: [id number]  Salome Arnt, [place]

## 2015-08-29 NOTE — Progress Notes (Signed)
Subjective: Interval History:  Patient is feeling a little better. She has episode on nause  but not vomiting. She still feels weak  Objective: Vital signs in last 24 hours: Temp:  [97.3 F (36.3 C)-98.3 F (36.8 C)] 98.2 F (36.8 C) (06/14 0531) Pulse Rate:  [79-99] 84 (06/14 0531) Resp:  [18-20] 18 (06/14 0531) BP: (79-123)/(36-72) 122/72 mmHg (06/14 0531) SpO2:  [84 %-99 %] 92 % (06/14 0531) Weight:  [53.7 kg (118 lb 6.2 oz)-53.9 kg (118 lb 13.3 oz)] 53.7 kg (118 lb 6.2 oz) (06/14 0516) Weight change: 0.09 kg (3.2 oz)  Intake/Output from previous day: 06/13 0701 - 06/14 0700 In: 243 [P.O.:240; I.V.:3] Out: 131  Intake/Output this shift:    Generally patient is alert and in no apparent distress dialysis  Chest: auscultation Heart exam revealed regular rate and rhythm no murmur nor S3 Extremities no edema  Lab Results:  Recent Labs  08/29/15 0546  WBC 7.5  HGB 10.6*  HCT 32.5*  PLT 163   BMET:   Recent Labs  08/29/15 0548  NA 133*  K 4.2  CL 98*  CO2 26  GLUCOSE 91  BUN 59*  CREATININE 4.52*  CALCIUM 8.2*   No results for input(s): PTH in the last 72 hours. Iron Studies:  No results for input(s): IRON, TIBC, TRANSFERRIN, FERRITIN in the last 72 hours.  Studies/Results: Dg Chest 2 View  08/27/2015  CLINICAL DATA:  Hypoxia and weakness EXAM: CHEST  2 VIEW COMPARISON:  09/08/2015 FINDINGS: Cardiac shadow remains enlarged. Diffuse increased interstitial changes are again identified bilaterally stable from the prior exam. No new focal infiltrate is seen. Aortic stent graft is again noted. No acute bony abnormality is seen. IMPRESSION: Stable chronic pulmonary opacities. No new focal abnormality is seen. Electronically Signed   By: Inez Catalina M.D.   On: 08/27/2015 19:15    I have reviewed the patient's current medications.  Assessment/Plan: Problem #1 End-stage renal disease: She is status post Hemodialysis yesterday. Her appetite is improving . She has  some nausea but no vomiting Problem #2 difficulty breathing: possibly from interstial  Lung disease. Presently no sign of fluid over load.UFR was interrupted by hypotension yesterday Problem #3 Metabolic bone disease: Her calcium has normalized but her phosphorus  High. Her phosphorus how ever is improving.  Patient presently is on a binder. Problem #4 anemia: Her hemoglobin has come up  our target goal. Patient is started on Epogen.  Problem #5 history of abdominal aortic aneurysm Problem #6 metabolic acidosis: Her CO2 has corrected. Plan: 1] Will dialyze patient for 31/2 tomorrow. 2] will check her renal panel and CBC in the morning. 3] will try to remove 1 liters tomorrow if her blood pressure tolerates. 4] will continue his Epogen at present dose 5] possibly remove her femoral catheter and use her fistula 6] patient will be going to [place] dialysis unit under Dr. Florene Glen when she is discharged.    LOS: 9 days   Emmalee Solivan S 08/29/2015,7:19 AM

## 2015-08-29 NOTE — Clinical Social Work Placement (Signed)
   CLINICAL SOCIAL WORK PLACEMENT  NOTE  Date:  08/29/2015  Patient Details  Name: [name] MRN: [id number] Date of Birth: [birth year]  Clinical Social Work is seeking post-discharge placement for this patient at the [place] level of care (*CSW will initial, date and re-position this form in  chart as items are completed):  Yes   Patient/family provided with [facility] Work Department's list of facilities offering this level of care within the geographic area requested by the patient (or if unable, by the patient's family).  Yes   Patient/family informed of their freedom to choose among providers that offer the needed level of care, that participate in Medicare, Medicaid or managed care program needed by the patient, have an available bed and are willing to accept the patient.  Yes   Patient/family informed of [facility]'s ownership interest in [facility] and [facility], as well as of the fact that they are under no obligation to receive care at these facilities.  PASRR submitted to EDS on 08/29/15     PASRR number received on 08/29/15     Existing PASRR number confirmed on       FL2 transmitted to all facilities in geographic area requested by pt/family on 08/29/15     FL2 transmitted to all facilities within larger geographic area on       Patient informed that his/her managed care company has contracts with or will negotiate with certain facilities, including the following:            Patient/family informed of bed offers received.  Patient chooses bed at       Physician recommends and patient chooses bed at      Patient to be transferred to   on  .  Patient to be transferred to facility by       Patient family notified on   of transfer.  Name of family member notified:        PHYSICIAN       Additional Comment:  Aware of [employer] authorization.   _______________________________________________ Salome Arnt, [place] 08/29/2015, 3:08 PM [phone]

## 2015-08-29 NOTE — Care Management Important Message (Signed)
Important Message  Patient Details  Name: [name] MRN: [id number] Date of Birth: [birth year]   Medicare Important Message Given:  Yes    Sherald Barge, RN 08/29/2015, 9:38 AM

## 2015-08-29 NOTE — Progress Notes (Signed)
PROGRESS NOTE    [name]  [id number] DOB: [birth year] DOA: 09/13/2015 PCP: Sherrie Mustache, MD Outpatient Specialists:    Brief Narrative:  64 yof was recently hospitalized at [facility] and discharged on 6/1. She presented  to the ED with complaints of SOB and found to be hypoxic. She was admitted for respiratory failure. Patient initially required BiPAP but has since remained stable. Her chronic kidney disease progressed to end stage renal disease this hospitalization and hemodialysis was initiated. Patient has undergone 2x sessions of HD, but has been having difficulty tolerating entire sessions. She remains significantly dyspneic on exertion and hypoxic with movement, so her discharge has been delayed. Disposition remains unclear at this point.  Assessment & Plan:   Principal Problem:   Acute renal failure superimposed on stage 4 chronic kidney disease (HCC) Active Problems:   ILD (interstitial lung disease) ([place])   Interstitial lung disease ([place])   ESRD on dialysis ([place])   Acute respiratory failure with hypoxia ([place])  1. Acute on chronic hypoxic respiratory failure, multifactorial.  Predominantly interstitial lung disease of unclear etiology. Pulmonary edema component has since resolved. She was recently started on home O2. She initially required BiPAP but has remained stable for several days with [place]. Continues to desaturate with movement and still has significantly increased work of breathing. Will continue steroids and breathing treatments. Start on incentive spirometry. 2. Acute renal failure which has progressed to end-stage renal disease. Hemodialysis initiated this hospitalization. Patient has difficulty tolerating entire sessions. 3. Acute on chronic diastolic heart failure, appears compensated. LVEF 45-50%t. Procardia was discontinued on previous discharge since it can contribute to V/Q mismatch. 4. Suspected interstitial lung disease. Patient was seen by  pulmonology earlier this month, with uncertain differential including autoimmune disease, pulmonary fibrosis, BOOP, viral pneumonitis, and pulmonary edema. Rotavirus was positive on recent respiratory panel. On 6/1 pulmonology recommended home O2 3L at rest, 4 L with exertion, Pulmicort, Brovana, and a prednisone taper. She will follow-up with Dr. Corrie Dandy as an outpatient for repeat CT chest. Bronchoscopy with BAL and lung biopsy are being considered as well as an outpatient. 5. Coronary artery disease. Stable. Continue aspirin and statins. 6. Anemia of chronic kidney disease, stable.  DVT prophylaxis: SCDs Code Status: Full Family Communication: Discussed with patient and family at bedside. Disposition Plan: Anticipate discharge to SNF once improved.   Consultants:  Nephrology   General surgery  Procedures:  Hemodialysis 6/11  Hemodialysis 6/13  Antimicrobials:  None  Subjective: Still becomes short of breath when she moves.   Objective: Filed Vitals:   08/29/15 0516 08/29/15 0531 08/29/15 0803 08/29/15 0810  BP:  122/72    Pulse:  84    Temp:  98.2 F (36.8 C)    TempSrc:  Oral    Resp:  18    Height:      Weight: 53.7 kg (118 lb 6.2 oz)     SpO2:  92% 91% 91%    Intake/Output Summary (Last 24 hours) at 08/29/15 1155 Last data filed at 08/29/15 0800  Gross per 24 hour  Intake    360 ml  Output    131 ml  Net    229 ml   Filed Weights   08/28/15 0636 08/28/15 1024 08/29/15 0516  Weight: 53.81 kg (118 lb 10.1 oz) 53.9 kg (118 lb 13.3 oz) 53.7 kg (118 lb 6.2 oz)    Examination:  General exam: Appears  To have increased respiratory effort.  Respiratory system: Occasional crackles  bilaterally. Cardiovascular system: S1 & S2 heard, RRR. No JVD, murmurs, rubs, gallops or clicks. No pedal edema. Gastrointestinal system: Abdomen is nondistended, soft and nontender. No organomegaly or masses felt. Normal bowel sounds heard. Central nervous system: Alert and  oriented. No focal neurological deficits. Extremities: Symmetric 5 x 5 power. Skin: No rashes, lesions or ulcers Psychiatry: Judgement and insight appear normal. Mood & affect appropriate.    Data Reviewed: I have personally reviewed following labs and imaging studies  CBC:  Recent Labs Lab 08/24/15 0410 08/25/15 0547 08/26/15 0609 08/29/15 0546  WBC 11.8* 9.7 9.4 7.5  HGB 9.9* 9.9* 9.8* 10.6*  HCT 30.8* 29.6* 29.7* 32.5*  MCV 94.2 94.0 93.7 94.8  PLT 284 200 200 [id number]   Basic Metabolic Panel:  Recent Labs Lab 08/23/15 0807 08/24/15 0410 08/25/15 0547 08/26/15 0609 08/29/15 0548  NA 134* 135 134* 134* 133*  K 4.2 4.5 4.9 4.9 4.2  CL 99* 98* 98* 97* 98*  CO2 23 26 25 24 26   GLUCOSE 123* 125* 120* 118* 91  BUN 112* 76* 73* 94* 59*  CREATININE 6.63* 4.87* 4.58* 5.57* 4.52*  CALCIUM 6.9* 7.7* 7.7* 8.3* 8.2*  PHOS  --   --  7.4* 8.5* 6.2*   GFR: Estimated Creatinine Clearance: 8.5 mL/min (by C-G formula based on Cr of 4.52). Liver Function Tests:  Recent Labs Lab 08/23/15 1042 08/25/15 0547 08/26/15 0609 08/29/15 0548  ALT 19  --   --   --   ALBUMIN  --  2.6* 2.6* 2.6*   Urine analysis:    Component Value Date/Time   COLORURINE YELLOW 08/08/2015 0550   APPEARANCEUR CLOUDY* 08/08/2015 0550   LABSPEC 1.015 08/08/2015 0550   PHURINE 5.5 08/08/2015 0550   GLUCOSEU NEGATIVE 08/08/2015 0550   HGBUR TRACE* 08/08/2015 0550   BILIRUBINUR NEGATIVE 08/08/2015 0550   KETONESUR NEGATIVE 08/08/2015 0550   PROTEINUR 30* 08/08/2015 0550   UROBILINOGEN 0.2 12/21/2012 1300   NITRITE NEGATIVE 08/08/2015 0550   LEUKOCYTESUR NEGATIVE 08/08/2015 0550   Sepsis Labs: @LABRCNTIP (procalcitonin:4,lacticidven:4)  ) Recent Results (from the past 240 hour(s))  MRSA PCR Screening     Status: None   Collection Time: 09/04/2015  4:50 PM  Result Value Ref Range Status   MRSA by PCR NEGATIVE NEGATIVE Final    Comment:        The GeneXpert MRSA Assay (FDA approved for NASAL  specimens only), is one component of a comprehensive MRSA colonization surveillance program. It is not intended to diagnose MRSA infection nor to guide or monitor treatment for MRSA infections.     Radiology Studies: Dg Chest 2 View  08/27/2015  CLINICAL DATA:  Hypoxia and weakness EXAM: CHEST  2 VIEW COMPARISON:  09/07/2015 FINDINGS: Cardiac shadow remains enlarged. Diffuse increased interstitial changes are again identified bilaterally stable from the prior exam. No new focal infiltrate is seen. Aortic stent graft is again noted. No acute bony abnormality is seen. IMPRESSION: Stable chronic pulmonary opacities. No new focal abnormality is seen. Electronically Signed   By: Inez Catalina M.D.   On: 08/27/2015 19:15   Scheduled Meds: . arformoterol  15 mcg Nebulization BID  . aspirin EC  81 mg Oral Daily  . budesonide  0.5 mg Nebulization BID  . calcitRIOL  0.25 mcg Oral Daily  . calcium acetate  2,001 mg Oral TID WC  . citalopram  20 mg Oral Daily  . [START ON 09/22/2015] cyanocobalamin  1,000 mcg Intramuscular Q30 days  . epoetin (EPOGEN/PROCRIT) injection  6,000 Units Intravenous Q T,Th,Sa-HD  . famotidine  20 mg Oral BID  . feeding supplement  1 Container Oral BID BM  . fluticasone  2 spray Each Nare Daily  . heparin  5,000 Units Subcutaneous Q8H  . levothyroxine  50 mcg Oral QAC breakfast  . methylPREDNISolone (SOLU-MEDROL) injection  40 mg Intravenous Daily  . pantoprazole  40 mg Oral Daily  . pravastatin  80 mg Oral QHS  . senna-docusate  1 tablet Oral Daily  . sodium chloride flush  3 mL Intravenous Q12H  . sodium chloride flush  3 mL Intravenous Q12H  . tuberculin  5 Units Intradermal Once   Continuous Infusions:    LOS: 9 days   Time spent: 25 minutes  Kathie Dike, MD [facility] Pager [phone]  If 7PM-7AM, please contact night-coverage [email or web address] Password [email or web address] 08/29/2015, 11:55 AM   By signing my name below, I, Delene Ruffini, attest that  this documentation has been prepared under the direction and in the presence of Kathie Dike, MD. Electronically Signed: Delene Ruffini 08/29/2015 2:15pm  I, Dr. Kathie Dike, personally performed the services described in this documentaiton. All medical record entries made by the scribe were at my direction and in my presence. I have reviewed the chart and agree that the record reflects my personal performance and is accurate and complete  Kathie Dike, MD, 08/29/2015 2:31 PM

## 2015-08-29 NOTE — Progress Notes (Signed)
Physical Therapy Treatment Patient Details Name: [name] MRN: [id number] DOB: [birth year] Today's Date: 08/29/2015    History of Present Illness 81 yo F admitted 08/20/2015 with SOB and acute respiratory failure due to ILD vs CHF with EF of 45-50%, volume overload due to ESRD.  PMH: CAD, HTN, AAA and repair, Renal artery stenosis, hypothyroidism, diverticulosis, renal insufficiency, anemia, MI, carpal tunnel release, AV fistula placement    PT Comments    Pt received in bed, and was agreeable to PT tx.  Pt expressed that it is hard to sit up in the chair because she is so weak.  Pt continues to be limited with mobility due to desaturation of SpO2 despite being on high flow O2.  Upon arrival into the room today, she was saturating 91% on 7L of HFNC.  She was only able to complete bed level UE/breathing exercises today due to desaturation with mobility down to 79% with need for increase up to 8L HFNC to recover back to 90%.  At the end of tx, pt was placed in chair position in the bed.  Family member educated on how to return bed back to normal positioning.  Due to pt's poor ability to maintain SpO2 saturation during mobility, it is felt that she will need SNF upon d/c to continue to progress her strength, and endurance.    Follow Up Recommendations  SNF     Equipment Recommendations  None recommended by PT    Recommendations for Other Services       Precautions / Restrictions Precautions Precautions: Fall Precaution Comments: Monitor SpO2 at all times during mobility due to quick desaturation.  Restrictions Weight Bearing Restrictions: No    Mobility  Bed Mobility                  Transfers                    Ambulation/Gait                 Stairs            Wheelchair Mobility    Modified Rankin (Stroke Patients Only)       Balance                                    Cognition Arousal/Alertness: Awake/alert Behavior  During Therapy: WFL for tasks assessed/performed Overall Cognitive Status: Within Functional Limits for tasks assessed                      Exercises Other Exercises Other Exercises:  Diaphragmatic breathing with 1 hand on abdomen, and 1 hand on the chest x 5 reps., with increased recovery period in between each rep Other Exercises: Breathing Elevating the Rib Cage x 5 reps with increased recovery period in between each rep - SpO2 desaturated to 79% with this exercise with need to increae O2 to 8L of HFNC and 2-3 minutes to recover to 90%.   Other Exercises: Breathing with shoulder Shrug x 5 reps with increased recovery period in between each rep, however pt able to maintain saturation at 90% on 8L HFNC Other Exercises: Breathing with Shoulder Hunch x 5 reps with increased recovery period in between each rep, and able to maintain saturation ato 90% on 8L HFNC Other Exercises: Breathing with Neck Rotation x 5 reps - able to maintain saturation at 90% on  8L HFNC.    General Comments        Pertinent Vitals/Pain Pain Assessment: No/denies pain    Home Living                      Prior Function            PT Goals (current goals can now be found in the care plan section) Acute Rehab PT Goals Patient Stated Goal: To go home and get stronger PT Goal Formulation: With patient Time For Goal Achievement: 09/10/15 Potential to Achieve Goals: Fair Progress towards PT goals: Progressing toward goals    Frequency  Min 4X/week    PT Plan      Co-evaluation             End of Session Equipment Utilized During Treatment: Oxygen Activity Tolerance: Treatment limited secondary to medical complications (Comment) Patient left: in bed;with family/visitor present;with chair alarm set;Other (comment) (IN chair position in the bed)     Time: [id number] PT Time Calculation (min) (ACUTE ONLY): 21 min  Charges:  $Therapeutic Exercise: 8-22 mins                    G  Codes:      Beth Londin Antone, PT, DPT X: [phone]

## 2015-08-29 NOTE — Progress Notes (Signed)
Initial Nutrition Assessment  DOCUMENTATION CODES:   Severe malnutrition in context of chronic illness  INTERVENTION:  Discontinue Nepro-pt says she vomited   Start Boost Breeze po BID, each supplement provides 250 kcal and 9 grams of protein   Recommend renal vitamin due to suspected deficiencies resulting from prolonged, poor oral intake  Agree with Renal diet due to her hyperphosphatemia and limited output  NUTRITION DIAGNOSIS:   Malnutrition related to poor appetite, chronic illness, catabolic illness as evidenced by per patient/family report, percent weight loss.  GOAL:   Patient will meet greater than or equal to 90% of their needs. Pt will achieve and maintain appropriate fluid intake.   MONITOR:   I & O's, Labs, PO intake, Weight trends  REASON FOR ASSESSMENT:   Malnutrition Screening Tool, LOS    ASSESSMENT:   Pt has hx of ESRD. Hospitalized at [facility] from 5/17-6/1. Problems included but not limited AKI-CKD-5, acute CHF, Anemia (received 2 units of PRBC). She was readmitted at [facility] SOB on 6/5. Dr Delrae Sawyers  saw her on 6/6 fluid overload-CKD, Binder ([place]) added 6/8 qith all meals due to hyperphosfotemia.  She has had some problems with hypotension also which has limited her HD treatments according to nephrology nursing notes.   Patients appetite self-reported  poor which also coincides with nutritional services reported to RD pt has very poor po intake. Today at breakfast she has eaten a few bites of frosted flakes. Pt says " everything I eat gives me indigestion". Audible gurgling in her stomach all during our time together. Pt denies ever having problem like this. She says her appetite has not been good with several months. Her ESRD likely is a  contributing factor. Hopefully this will improve for her once she is stable and tolerating her HD.    She had been consuming mostly apple juice, grape Gatorade and saltine crackers prior to her admission to [facility] and has continued  to eat poorl since. She doesn't take vitamins or drink nutrition supplements at home and is not tolerating the Nepro per pt (vomited it up).   Concerned for her persisting lack of sufficient intake. May need to consider NG placement if poor po's don't soon improve.   Her weight has plummeted by 14% < 90 days.   NFPE:  Mild fat loss and moderate to severe loss of muscle. Pt says she hasn't been out of the bed but one time since admission. C/o increased weakness. No edema noted lower extremities or tenting.   Recent Labs Lab 08/25/15 0547 08/26/15 0609 08/29/15 0548  NA 134* 134* 133*  K 4.9 4.9 4.2  CL 98* 97* 98*  CO2 25 24 26   BUN 73* 94* 59*  CREATININE 4.58* 5.57* 4.52*  CALCIUM 7.7* 8.3* 8.2*  PHOS 7.4* 8.5* 6.2*  GLUCOSE 120* 118* 91     Diet Order:  Diet renal with fluid restriction Fluid restriction:: 1200 mL Fluid; Room service appropriate?: Yes; Fluid consistency:: Thin  Skin:   left upper arm (purple). Femoral catheter groin.  Last BM:  08/27/15  Height:   Ht Readings from Last 1 Encounters:  08/25/2015 5\' 3"  (1.6 m)    Weight:   Wt Readings from Last 1 Encounters:  08/29/15 118 lb 6.2 oz (53.7 kg)    Ideal Body Weight:  52.2 kg  BMI:  Body mass index is 20.98 kg/(m^2).  Estimated Nutritional Needs:   Kcal:  1620-1890 kcal  Protein:  70-79 gr  Fluid:  1.2 liters daily  EDUCATION NEEDS:   Education needs addressed  Colman Cater MS,RD,CSG,LDN Office: [phone] Pager: [phone]

## 2015-08-29 NOTE — Progress Notes (Addendum)
[place] Hospital Day(s): 9.   Post op day(s):  Marland Kitchen   Interval History: Patient seen and examined, no acute events or new complaints overnight. Patient reports having tolerated dialysis via Right groin catheter reasonably well and denies Left arm or Right groin pain, CP, SOB, or fever/chills, states she feels "as good as I get".  Review of Systems:  Constitutional: denies fever, chills  HEENT: denies cough or congestion  Respiratory: denies any shortness of breath  Cardiovascular: denies chest pain or palpitations  Gastrointestinal: denies N/V, diarrhea or constipation  Musculoskeletal: denies pain, decreased motor or sensation  Neurological: denies HA or vision/hearing changes   Vital signs in last 24 hours: [min-max] current  Temp:  [97.3 F (36.3 C)-98.3 F (36.8 C)] 98.2 F (36.8 C) (06/14 0531) Pulse Rate:  [79-99] 84 (06/14 0531) Resp:  [18-20] 18 (06/14 0531) BP: (79-123)/(36-72) 122/72 mmHg (06/14 0531) SpO2:  [84 %-93 %] 91 % (06/14 0810) Weight:  [53.7 kg (118 lb 6.2 oz)-53.9 kg (118 lb 13.3 oz)] 53.7 kg (118 lb 6.2 oz) (06/14 0516)     Height: 5\' 3"  (160 cm) Weight: 53.7 kg (118 lb 6.2 oz) BMI (Calculated): 22.9   Intake/Output this shift:      Intake/Output last 2 shifts:  @IOLAST2SHIFTS @   Physical Exam:  Constitutional: alert, cooperative and no distress  HENT: normocephalic without obvious abnormality  Eyes: PERRL, EOM's grossly intact and symmetric  Neuro: CN II - XII grossly intact and symmetric without deficit  Respiratory: breathing non-labored at rest  Cardiovascular: regular rate and sinus rhythm  Gastrointestinal: soft, non-tender, and non-distended  Musculoskeletal: UE and LE FROM, LUQ brachial-cephalic AVF with mildly pulsatile easily palpable thrill associated with access-associated mid-arm NT hematoma, palpable Left brachial and radial pulses   Labs:  CBC:  Lab Results  Component Value Date   WBC 7.5 08/29/2015   RBC 3.43*  08/29/2015   RBC 2.39* 08/01/2015   BMP:  Lab Results  Component Value Date   GLUCOSE 91 08/29/2015   CO2 26 08/29/2015   BUN 59* 08/29/2015   CREATININE 4.52* 08/29/2015   CALCIUM 8.2* 08/29/2015     Imaging studies:  Duplex Ultrasound of Left Upper Extremity Brachial-Cephalic AV Fistula ([id number])  - study images personally reviewed, no radiology interpretation available  - patent well-matured Brachial-Cephalic fistula anastomosis (3.2 mm) and venous outflow (5.6 mm - 7.2 mm)  - relative narrowing of cephalic venous outflow at the mid-arm level (3.3 mm), corresponding to mild pulsatility on exam, though mild pulsatility also corresponds to site of access-associated hematoma   Assessment/Plan:  81 y.o. female with ESRD and difficulty accessing LUE brachial-cephalic AV fistula, complicated by pertinent comorbidities including acute exacerbation of chronic diastolic CHF, acute hypoxic respiratory failure with pulmonary infiltrates of unclear etiology, CAD, hypertension, and hyperlipidemia.  - Left upper extremity Brachial-Cephalic AV fistula appears well-matured on duplex with a corresponding with easily palpable mildly pulsatile thrill associated with access-associated hematoma at mid-arm  - No reason Left upper extremity Brachial-Cephalic AV fistula should not be able to be accessed for HD  * Follow up in surgery office to reassess fistula function and pulsatility in 2 weeks, possible fistulogram - Non-tunneled Right femoral vein hemodialysis catheter uneventfully removed --> lay flat x 1 hour  - Discharge planning, medical management as per medical team    All of the above findings and recommendations were discussed with the patient, and all of her questions were answered to her expressed satisfaction.  Thank  you for the opportunity to participate in the care for this patient. Please call if questions.  -- [name], [place]:  [place] and V[facility] Surgery Office: [phone]

## 2015-08-30 LAB — BASIC METABOLIC PANEL
Anion gap: 12 (ref 5–15)
BUN: 81 mg/dL — AB (ref 6–20)
CO2: 24 mmol/L (ref 22–32)
CREATININE: 5.69 mg/dL — AB (ref 0.44–1.00)
Calcium: 8.5 mg/dL — ABNORMAL LOW (ref 8.9–10.3)
Chloride: 97 mmol/L — ABNORMAL LOW (ref 101–111)
GFR, EST AFRICAN AMERICAN: 7 mL/min — AB (ref >60)
GFR, EST NON AFRICAN AMERICAN: 6 mL/min — AB (ref >60)
Glucose, Bld: 136 mg/dL — ABNORMAL HIGH (ref 65–99)
POTASSIUM: 4.9 mmol/L (ref 3.5–5.1)
SODIUM: 133 mmol/L — AB (ref 135–145)

## 2015-08-30 MED ORDER — SODIUM CHLORIDE 0.9 % IV SOLN
INTRAVENOUS | Status: DC
  Filled 2015-08-30: qty 1.2

## 2015-08-30 MED ORDER — EPOETIN ALFA 10000 UNIT/ML IJ SOLN
INTRAMUSCULAR | Status: AC
  Filled 2015-08-30: qty 1

## 2015-08-30 MED ORDER — CHLORHEXIDINE GLUCONATE 4 % EX LIQD
60.0000 mL | Freq: Once | CUTANEOUS | Status: AC
  Administered 2015-08-31: 4 via TOPICAL
  Filled 2015-08-30: qty 15

## 2015-08-30 MED ORDER — CEFAZOLIN SODIUM-DEXTROSE 2-4 GM/100ML-% IV SOLN
2.0000 g | INTRAVENOUS | Status: AC
  Administered 2015-08-31: 2 g via INTRAVENOUS
  Filled 2015-08-30 (×2): qty 100

## 2015-08-30 MED ORDER — FAMOTIDINE 20 MG PO TABS
20.0000 mg | ORAL_TABLET | Freq: Every day | ORAL | Status: DC
  Administered 2015-08-30 – 2015-09-02 (×3): 20 mg via ORAL
  Filled 2015-08-30 (×3): qty 1

## 2015-08-30 MED ORDER — CHLORHEXIDINE GLUCONATE 4 % EX LIQD
60.0000 mL | Freq: Once | CUTANEOUS | Status: DC
  Administered 2015-08-31: 4 via TOPICAL
  Filled 2015-08-30: qty 15

## 2015-08-30 NOTE — Progress Notes (Signed)
Patient experienced difficulty with access of her Left brachial-cephalic AV fistula for hemodialysis. Tunneled HD catheter was requested for durable hemodialysis access while AV fistula is optimized. Will plan for insertion of Right IJ tunneled hemodialysis catheter tomorrow morning. Discussed with patient, HD RN, and Dr. Lowanda Foster.  -- Marilynne Drivers. Rosana Hoes, [place]: [place] and V[facility] Surgery Office #: [phone]

## 2015-08-30 NOTE — Clinical Social Work Placement (Signed)
   CLINICAL SOCIAL WORK PLACEMENT  NOTE  Date:  08/30/2015  Patient Details  Name: [name] MRN: [id number] Date of Birth: 04/05/1934  Clinical Social Work is seeking post-discharge placement for this patient at the [place] level of care (*CSW will initial, date and re-position this form in  chart as items are completed):  Yes   Patient/family provided with [place] Work Department's list of facilities offering this level of care within the geographic area requested by the patient (or if unable, by the patient's family).  Yes   Patient/family informed of their freedom to choose among providers that offer the needed level of care, that participate in Medicare, Medicaid or managed care program needed by the patient, have an available bed and are willing to accept the patient.  Yes   Patient/family informed of [facility]'s ownership interest in [facility] and [facility], as well as of the fact that they are under no obligation to receive care at these facilities.  PASRR submitted to EDS on 08/29/15     PASRR number received on 08/29/15     Existing PASRR number confirmed on       FL2 transmitted to all facilities in geographic area requested by pt/family on 08/29/15     FL2 transmitted to all facilities within larger geographic area on       Patient informed that his/her managed care company has contracts with or will negotiate with certain facilities, including the following:        Yes   Patient/family informed of bed offers received.  Patient chooses bed at [facility]     Physician recommends and patient chooses bed at      Patient to be transferred to [facility] on  .  Patient to be transferred to facility by       Patient family notified on   of transfer.  Name of family member notified:        PHYSICIAN       Additional Comment:    _______________________________________________ Salome Arnt,  LCSW 08/30/2015, 12:01 PM [phone]

## 2015-08-30 NOTE — Clinical Social Work Note (Signed)
Pt in dialysis this morning. Pt's husband requesting update on plan. CSW provided this information. Pt requested to do whatever her daughter wanted yesterday. [name] called CSW multiple times changing preferred facility. CSW informed her that authorization needed to be started and last preference was for [facility] or [facility]. As [name] provides transportation to dialysis, she accepts this bed offer. Facility has started authorization. They are aware of dialysis at [facility] and that pt is currently on 7 liters high-flow oxygen.   [name], [place]

## 2015-08-30 NOTE — Progress Notes (Signed)
Subjective: Interval History:  Patient is feeling better. Patient denies any nausea or vomiting. Her appetite has this moment seems to be improving. Patient also denies any difficulty breathing.  Objective: Vital signs in last 24 hours: Temp:  [97.7 F (36.5 C)-98.7 F (37.1 C)] 97.7 F (36.5 C) (06/15 0454) Pulse Rate:  [68-86] 69 (06/15 0454) Resp:  [16-20] 18 (06/15 0454) BP: (120-140)/(63-72) 140/67 mmHg (06/15 0454) SpO2:  [90 %-96 %] 94 % (06/15 0454) Weight:  [52.889 kg (116 lb 9.6 oz)] 52.889 kg (116 lb 9.6 oz) (06/15 0454) Weight change: -1.011 kg (-2 lb 3.7 oz)  Intake/Output from previous day: 06/14 0701 - 06/15 0700 In: 360 [P.O.:360] Out: -  Intake/Output this shift:    Generally patient is alert and in no apparent distress dialysis  Chest: auscultation Heart exam revealed regular rate and rhythm no murmur nor S3 Extremities no edema  Lab Results:  Recent Labs  08/29/15 0546  WBC 7.5  HGB 10.6*  HCT 32.5*  PLT 163   BMET:   Recent Labs  08/29/15 0548 08/30/15 0537  NA 133* 133*  K 4.2 4.9  CL 98* 97*  CO2 26 24  GLUCOSE 91 136*  BUN 59* 81*  CREATININE 4.52* 5.69*  CALCIUM 8.2* 8.5*   No results for input(s): PTH in the last 72 hours. Iron Studies:  No results for input(s): IRON, TIBC, TRANSFERRIN, FERRITIN in the last 72 hours.  Studies/Results: No results found.  I have reviewed the patient's current medications.  Assessment/Plan: Problem #1 End-stage renal disease: She is status post Hemodialysis On Tuesday. Patient is due for dialysis today. Presently her appetite seems to be improving and her potassium is normal. Problem #2 difficulty breathing: possibly from interstial  Lung disease. Patient remains on oxygen and still she has some difficulty breathing seems to be much better. Problem #3 Metabolic bone disease: Her calcium has normalized but her phosphorus  High. Her phosphorus how ever is improving.  Patient presently is on a  binder. Problem #4 anemia: Her hemoglobin has come up  our target goal. Patient is started on Epogen.  Problem #5 history of abdominal aortic aneurysm Problem #6 metabolic acidosis: Her CO2 has corrected. Plan: 1] Will dialyze patient for 31/2 today using her fistula. Her femoral catheter has been removed yesterday. 2] will check her renal panel and CBC in the morning. 3] will try to remove 1 liters tomorrow if her blood pressure tolerates. 4] patient will be going to [facility] dialysis unit under Dr. Florene Glen when she is discharged.    LOS: 10 days   Britzy Graul S 08/30/2015,8:24 AM

## 2015-08-30 NOTE — Progress Notes (Signed)
PROGRESS NOTE    [name]  [id number] DOB: 1935-02-27 DOA: 08/23/2015 PCP: Sherrie Mustache, MD Outpatient Specialists:    Brief Narrative:  54 yof was recently hospitalized at [facility] and discharged on 6/1. She presented  to the ED with complaints of SOB and found to be hypoxic. She was admitted for respiratory failure. Patient initially required BiPAP but has since remained stable. Her chronic kidney disease progressed to end stage renal disease this hospitalization and hemodialysis was initiated. Patient has undergone 2x sessions of HD, but has been having difficulty tolerating entire sessions. She remains significantly dyspneic on exertion and hypoxic with movement, so her discharge has been delayed. Disposition remains unclear at this point.  Assessment & Plan:   Principal Problem:   Acute renal failure superimposed on stage 4 chronic kidney disease (HCC) Active Problems:   ILD (interstitial lung disease) ([place])   Interstitial lung disease ([place])   ESRD on dialysis ([place])   Acute respiratory failure with hypoxia (HCC)   Protein-calorie malnutrition, severe  1. Acute on chronic hypoxic respiratory failure, multifactorial.  Predominantly interstitial lung disease of unclear etiology. Pulmonary edema component has since resolved. She was recently started on home O2. She initially required BiPAP but has remained stable for several days with [place]. Continues to desaturate with movement and still has significantly increased work of breathing. She appears to have an increasing oxygen requirement. Will continue steroids, breathing treatments, and incentive spirometry. If fails to improve, may need to consult with pulmonology 2. Acute renal failure which has progressed to end-stage renal disease. Hemodialysis initiated this hospitalization. Patient noted to have difficulty tolerating entire sessions. Unable to be dialyzed from fistula. Will have new HD catheter placed in AM.  3. Acute  on chronic diastolic heart failure, appears compensated. LVEF 45-50%t. Procardia was discontinued on previous discharge since it can contribute to V/Q mismatch. 4. Suspected interstitial lung disease. Patient was seen by pulmonology earlier this month, with uncertain differential including autoimmune disease, pulmonary fibrosis, BOOP, viral pneumonitis, and pulmonary edema. Rotavirus was positive on recent respiratory panel. On 6/1 pulmonology recommended home O2 3L at rest, 4 L with exertion, Pulmicort, Brovana, and a prednisone taper. She will follow-up with Dr. Corrie Dandy as an outpatient for repeat CT chest. Bronchoscopy with BAL and lung biopsy are being considered as well as an outpatient.she is continued on high dose steroids for now 5. Coronary artery disease. Stable. Continue aspirin and statins. 6. Anemia of chronic kidney disease, stable.  DVT prophylaxis: SCDs Code Status: Full Family Communication: Discussed with patient and family at bedside. Disposition Plan: Anticipate discharge to SNF once improved.   Consultants:  Nephrology   General surgery  Procedures:  Hemodialysis 6/11  Hemodialysis 6/13  Antimicrobials:  None  Subjective: Feels that breathing is unchanged from yesterday. Unable to have dialysis from fistula today.   Objective: Filed Vitals:   08/29/15 1927 08/29/15 2112 08/29/15 2214 08/30/15 0454  BP:   138/72 140/67  Pulse:  74 68 69  Temp:   98.7 F (37.1 C) 97.7 F (36.5 C)  TempSrc:   Oral Oral  Resp:  16 16 18   Height:      Weight:    52.889 kg (116 lb 9.6 oz)  SpO2: 94% 94% 96% 94%    Intake/Output Summary (Last 24 hours) at 08/30/15 0828 Last data filed at 08/29/15 1945  Gross per 24 hour  Intake    240 ml  Output      0 ml  Net    240 ml   Filed Weights   08/28/15 1024 08/29/15 0516 08/30/15 0454  Weight: 53.9 kg (118 lb 13.3 oz) 53.7 kg (118 lb 6.2 oz) 52.889 kg (116 lb 9.6 oz)    Examination:  General exam: does not appear to  be in distress.  Respiratory system: scattered rhonchi bilaterally Cardiovascular system: S1 & S2 heard, RRR. No JVD, murmurs, rubs, gallops or clicks. No pedal edema. Gastrointestinal system: Abdomen is nondistended, soft and nontender. No organomegaly or masses felt. Normal bowel sounds heard. Central nervous system: Alert and oriented. No focal neurological deficits. Extremities: Symmetric 5 x 5 power. Skin: No rashes, lesions or ulcers Psychiatry: Judgement and insight appear normal. Mood & affect appropriate.    Data Reviewed: I have personally reviewed following labs and imaging studies  CBC:  Recent Labs Lab 08/24/15 0410 08/25/15 0547 08/26/15 0609 08/29/15 0546  WBC 11.8* 9.7 9.4 7.5  HGB 9.9* 9.9* 9.8* 10.6*  HCT 30.8* 29.6* 29.7* 32.5*  MCV 94.2 94.0 93.7 94.8  PLT 284 200 200 [id number]   Basic Metabolic Panel:  Recent Labs Lab 08/24/15 0410 08/25/15 0547 08/26/15 0609 08/29/15 0548 08/30/15 0537  NA 135 134* 134* 133* 133*  K 4.5 4.9 4.9 4.2 4.9  CL 98* 98* 97* 98* 97*  CO2 26 25 24 26 24   GLUCOSE 125* 120* 118* 91 136*  BUN 76* 73* 94* 59* 81*  CREATININE 4.87* 4.58* 5.57* 4.52* 5.69*  CALCIUM 7.7* 7.7* 8.3* 8.2* 8.5*  PHOS  --  7.4* 8.5* 6.2*  --    GFR: Estimated Creatinine Clearance: 6.7 mL/min (by C-G formula based on Cr of 5.69). Liver Function Tests:  Recent Labs Lab 08/23/15 1042 08/25/15 0547 08/26/15 0609 08/29/15 0548  ALT 19  --   --   --   ALBUMIN  --  2.6* 2.6* 2.6*   Urine analysis:    Component Value Date/Time   COLORURINE YELLOW 08/08/2015 0550   APPEARANCEUR CLOUDY* 08/08/2015 0550   LABSPEC 1.015 08/08/2015 0550   PHURINE 5.5 08/08/2015 0550   GLUCOSEU NEGATIVE 08/08/2015 0550   HGBUR TRACE* 08/08/2015 0550   BILIRUBINUR NEGATIVE 08/08/2015 0550   KETONESUR NEGATIVE 08/08/2015 0550   PROTEINUR 30* 08/08/2015 0550   UROBILINOGEN 0.2 12/21/2012 1300   NITRITE NEGATIVE 08/08/2015 0550   LEUKOCYTESUR NEGATIVE 08/08/2015 0550    Sepsis Labs: @LABRCNTIP (procalcitonin:4,lacticidven:4)  ) Recent Results (from the past 240 hour(s))  MRSA PCR Screening     Status: None   Collection Time: 09/11/2015  4:50 PM  Result Value Ref Range Status   MRSA by PCR NEGATIVE NEGATIVE Final    Comment:        The GeneXpert MRSA Assay (FDA approved for NASAL specimens only), is one component of a comprehensive MRSA colonization surveillance program. It is not intended to diagnose MRSA infection nor to guide or monitor treatment for MRSA infections.     Radiology Studies: No results found. Scheduled Meds: . arformoterol  15 mcg Nebulization BID  . aspirin EC  81 mg Oral Daily  . budesonide  0.5 mg Nebulization BID  . calcitRIOL  0.25 mcg Oral Daily  . calcium acetate  2,001 mg Oral TID WC  . citalopram  20 mg Oral Daily  . [START ON 09/22/2015] cyanocobalamin  1,000 mcg Intramuscular Q30 days  . epoetin (EPOGEN/PROCRIT) injection  6,000 Units Intravenous Q T,Th,Sa-HD  . famotidine  20 mg Oral Daily  . feeding supplement  1 Container Oral BID BM  .  fluticasone  2 spray Each Nare Daily  . guaiFENesin  1,200 mg Oral BID  . heparin  5,000 Units Subcutaneous Q8H  . ipratropium-albuterol  3 mL Nebulization Q6H  . levothyroxine  50 mcg Oral QAC breakfast  . methylPREDNISolone (SOLU-MEDROL) injection  60 mg Intravenous Q6H  . pantoprazole  40 mg Oral Daily  . pravastatin  80 mg Oral QHS  . senna-docusate  1 tablet Oral Daily  . sodium chloride flush  3 mL Intravenous Q12H  . sodium chloride flush  3 mL Intravenous Q12H  . tuberculin  5 Units Intradermal Once   Continuous Infusions:    LOS: 10 days   Time spent: 25 minutes  Kathie Dike, MD [facility] Pager [phone]  If 7PM-7AM, please contact night-coverage [email or web address] Password [facility] 08/30/2015, 8:28 AM

## 2015-08-30 NOTE — ED Provider Notes (Signed)
CSN: [id number]     Arrival date & time 08/01/15  1219 History   First MD Initiated Contact with Patient 08/01/15 1333     Chief Complaint  Patient presents with  . Shortness of Breath     (Consider location/radiation/quality/duration/timing/severity/associated sxs/prior Treatment) Patient is a 81 y.o. female presenting with shortness of breath. The history is provided by the patient and a relative.  Shortness of Breath Severity:  Severe Onset quality:  Sudden Duration:  2 weeks Timing:  Constant Progression:  Worsening Chronicity:  Recurrent Relieved by:  Nothing Worsened by:  Nothing tried Ineffective treatments:  None tried Associated symptoms: cough   Associated symptoms: no chest pain, no fever, no headaches, no vomiting and no wheezing    81 yo F with sob on exertion, worsening over past couple days.  A couple events that felt like her prior MI, diaphoresis, nausea.  Improved with nitro.  No chest pain with these events.    Past Medical History  Diagnosis Date  . CAD (coronary artery disease)     50% proximal LAD stenosis, 50% marginal stenosis of the circumflex, RCA 95% stenosis, EF 40% stenosis with hypokinesis of the interior wall.  She had a Taxus stent placed in 04/16/2006  . Hypertension   . Abdominal aortic aneurysm ([place])   . Renal artery stenosis ([facility])     S/P stenting of the right renal atrophic left renal  . Dyslipidemia   . Hypothyroidism   . Tobacco user     Previous  . Adenomatous polyp 2004, 2011  . Hyperplastic colon polyp 2011  . Hemorrhoids   . Diverticulosis   . Renal insufficiency   . Anemia   . Myocardial infarction ([place])     stent 2008  . Constipation   . Cataract   . AAA (abdominal aortic aneurysm) [facility])    Past Surgical History  Procedure Laterality Date  . Abdominal hysterectomy    . Carpal tunnel release Bilateral   . Cholecystectomy    . Flexible sigmoidoscopy  02/25/2012    Procedure: FLEXIBLE SIGMOIDOSCOPY;  Surgeon: Inda Castle, MD;  Location: [facility];  Service: Endoscopy;  Laterality: N/A;  . Hemorrhoid banding  02/25/2012    Procedure: HEMORRHOID BANDING;  Surgeon: Inda Castle, MD;  Location: [facility];  Service: Endoscopy;  Laterality: N/A;  . Abdominal angiogram  12/21/2012    AAA  . Abdominal aortic endovascular stent graft N/A 12/22/2012    Procedure: ABDOMINAL AORTIC ENDOVASCULAR STENT GRAFT- GORE;  Surgeon: Mal Misty, MD;  Location: [place];  Service: Vascular;  Laterality: N/A;  Ultrasound guided  . Abdominal angiogram N/A 11/30/2012    Procedure: ABDOMINAL ANGIOGRAM;  Surgeon: Serafina Mitchell, MD;  Location: [facility] CATH LAB;  Service: Cardiovascular;  Laterality: N/A;  . Cataract extraction, bilateral    . Colonoscopy    . Polypectomy    . Cardiac catheterization  2008  . Av fistula placement Left 04/20/2015    Procedure: CREATION OF LEFT UPPER ARM BRACHIOCEPHALIC ARTERIOVENOUS (AV) FISTULA  ;  Surgeon: Mal Misty, MD;  Location: [place];  Service: Vascular;  Laterality: Left;  . Cardiac catheterization N/A 08/08/2015    Procedure: Right Heart Cath;  Surgeon: Jolaine Artist, MD;  Location: [place] CV LAB;  Service: Cardiovascular;  Laterality: N/A;   Family History  Problem Relation Age of Onset  . Uterine cancer Mother   . Heart attack Father 67  . Heart disease Father  before age 26  . Peripheral vascular disease Sister   . Other Sister     Renal artery stenosis  . Heart disease Sister     before age 79  . Heart attack Brother 47  . Heart disease Brother     before age 40  . Lung cancer Brother     \  . Colon cancer Neg Hx   . Breast cancer Maternal Aunt     x2   Social History  Substance Use Topics  . Smoking status: Former Smoker -- 1 years    Types: Cigarettes    Quit date: 03/17/2006  . Smokeless tobacco: Never Used  . Alcohol Use: No   OB History    No data available     Review of Systems  Constitutional: Negative for fever and chills.  HENT:  Positive for congestion. Negative for rhinorrhea.   Eyes: Negative for redness and visual disturbance.  Respiratory: Positive for cough and shortness of breath. Negative for wheezing.   Cardiovascular: Negative for chest pain and palpitations.  Gastrointestinal: Negative for nausea and vomiting.  Genitourinary: Negative for dysuria and urgency.  Musculoskeletal: Negative for myalgias and arthralgias.  Skin: Negative for pallor and wound.  Neurological: Negative for dizziness and headaches.      Allergies  Codeine and Sulfonamide derivatives  Home Medications   Prior to Admission medications   Medication Sig Start Date End Date Taking? Authorizing Provider  aspirin EC 81 MG tablet Take 81 mg by mouth daily.   Yes Historical Provider, MD  citalopram (CELEXA) 20 MG tablet Take 20 mg by mouth daily.  12/16/10  Yes Historical Provider, MD  cyanocobalamin (,VITAMIN B-12,) 1000 MCG/ML injection Inject 1,000 mcg into the muscle every 30 (thirty) days.    Yes Historical Provider, MD  levothyroxine (SYNTHROID, LEVOTHROID) 50 MCG tablet Take 50 mcg by mouth daily.    Yes Historical Provider, MD  Linaclotide Rolan Lipa) 145 MCG CAPS capsule Take 1 capsule (145 mcg total) by mouth daily. Patient not taking: Reported on 08/19/2015 06/07/15  Yes Milus Banister, MD  meclizine (ANTIVERT) 25 MG tablet Take 1 tablet (25 mg total) by mouth 3 (three) times daily as needed for dizziness. 08/14/14  Yes Veryl Speak, MD  NIFEdipine (PROCARDIA XL/ADALAT-CC) 60 MG 24 hr tablet TAKE 1 TABLET (60 MG TOTAL) BY MOUTH DAILY. PATIENT NEEDS TO CONTACT OFFICE FOR ADDITIONAL REFILLS 07/12/15  Yes Minus Breeding, MD  nitroGLYCERIN (NITROSTAT) 0.4 MG SL tablet Place 0.4 mg under the tongue every 5 (five) minutes as needed for chest pain.   Yes Historical Provider, MD  omeprazole (PRILOSEC) 20 MG capsule Take 20 mg by mouth daily as needed (For heartburn or acid reflux.).  07/13/13  Yes Historical Provider, MD  pravastatin  (PRAVACHOL) 80 MG tablet Take 80 mg by mouth at bedtime.  03/29/12  Yes Historical Provider, MD  traMADol (ULTRAM) 50 MG tablet Take 1 tablet (50 mg total) by mouth every 6 (six) hours as needed. Patient not taking: Reported on 09/09/2015 04/20/15  Yes Joelene Millin A Trinh, PA-C  arformoterol (BROVANA) 15 MCG/2ML NEBU Take 2 mLs (15 mcg total) by nebulization 2 (two) times daily. 08/16/15   Theodis Blaze, MD  bisacodyl (BISACODYL) 5 MG EC tablet Take 5 mg by mouth daily as needed for moderate constipation.    Historical Provider, MD  budesonide (PULMICORT) 0.5 MG/2ML nebulizer solution Take 2 mLs (0.5 mg total) by nebulization 2 (two) times daily. 08/16/15   Theodis Blaze, MD  calcitRIOL (ROCALTROL) 0.25 MCG capsule Take 1 capsule (0.25 mcg total) by mouth daily. 08/16/15   Theodis Blaze, MD  diphenhydramine-acetaminophen (ACETAMINOPHEN PM) 25-500 MG TABS tablet Take 1 tablet by mouth at bedtime as needed.    Historical Provider, MD  furosemide (LASIX) 80 MG tablet Take 1 tablet (80 mg total) by mouth daily. *MUST MAKE APPOINTMENT* 08/16/15   Theodis Blaze, MD  HYDROcodone-acetaminophen (NORCO/VICODIN) 5-325 MG tablet Take 1 tablet by mouth every 6 (six) hours as needed for moderate pain. Patient not taking: Reported on 09/04/2015 08/14/15   Theodis Blaze, MD  ipratropium-albuterol (DUONEB) 0.5-2.5 (3) MG/3ML SOLN Take 3 mLs by nebulization 4 (four) times daily - after meals and at bedtime. 08/16/15   Theodis Blaze, MD  predniSONE (DELTASONE) 20 MG tablet Take 40 mg tablet for 5 days, after that continue with 30 mg tablet once daily for 5 days, followed by 20 mg tablet daily for 5 days, 10 mg tablet for 5 days and ensure follow up with pulmonologist 08/16/15   Theodis Blaze, MD  senna-docusate (SENOKOT-S) 8.6-50 MG tablet Take 1 tablet by mouth daily.    Historical Provider, MD  traZODone (DESYREL) 50 MG tablet Take 0.5 tablets (25 mg total) by mouth at bedtime as needed for sleep. Patient not taking: Reported on 08/19/2015  08/14/15   Theodis Blaze, MD  triamcinolone (NASACORT ALLERGY 24HR) 55 MCG/ACT AERO nasal inhaler Place 2 sprays into the nose daily.    Historical Provider, MD   BP 131/67 mmHg  Pulse 75  Temp(Src) 97.8 F (36.6 C) (Oral)  Resp 18  Wt 129 lb 3 oz (58.6 kg)  SpO2 97% Physical Exam  Constitutional: She is oriented to person, place, and time. She appears well-developed and well-nourished. No distress.  HENT:  Head: Normocephalic and atraumatic.  Eyes: EOM are normal. Pupils are equal, round, and reactive to light.  Neck: Normal range of motion. Neck supple.  Cardiovascular: Normal rate, regular rhythm and intact distal pulses.  Exam reveals no gallop and no friction rub.   No murmur heard. Pulmonary/Chest: Effort normal. She has no wheezes. She has no rales.  Abdominal: Soft. She exhibits no distension. There is no tenderness.  Musculoskeletal: She exhibits no edema or tenderness.  Neurological: She is alert and oriented to person, place, and time.  Skin: Skin is warm and dry. She is not diaphoretic.  Psychiatric: She has a normal mood and affect. Her behavior is normal.  Nursing note and vitals reviewed.   ED Course  Procedures (including critical care time) Labs Review Labs Reviewed  URINE CULTURE - Abnormal; Notable for the following:    Culture MULTIPLE SPECIES PRESENT, SUGGEST RECOLLECTION (*)    All other components within normal limits  RESPIRATORY PANEL BY PCR - Abnormal; Notable for the following:    Rhinovirus / Enterovirus DETECTED (*)    All other components within normal limits  BASIC METABOLIC PANEL - Abnormal; Notable for the following:    CO2 20 (*)    Glucose, Bld 106 (*)    BUN 35 (*)    Creatinine, Ser 4.56 (*)    Calcium 7.6 (*)    GFR calc non Af Amer 8 (*)    GFR calc Af Amer 10 (*)    All other components within normal limits  CBC - Abnormal; Notable for the following:    WBC 11.1 (*)    RBC 2.65 (*)    Hemoglobin 8.2 (*)  HCT 24.8 (*)    All  other components within normal limits  BRAIN NATRIURETIC PEPTIDE - Abnormal; Notable for the following:    B Natriuretic Peptide 3396.8 (*)    All other components within normal limits  VITAMIN B12 - Abnormal; Notable for the following:    Vitamin B-12 2631 (*)    All other components within normal limits  IRON AND TIBC - Abnormal; Notable for the following:    Iron 14 (*)    Saturation Ratios 5 (*)    All other components within normal limits  RETICULOCYTES - Abnormal; Notable for the following:    RBC. 2.39 (*)    All other components within normal limits  TROPONIN I - Abnormal; Notable for the following:    Troponin I 0.28 (*)    All other components within normal limits  TROPONIN I - Abnormal; Notable for the following:    Troponin I 0.18 (*)    All other components within normal limits  CBC - Abnormal; Notable for the following:    RBC 2.50 (*)    Hemoglobin 7.4 (*)    HCT 22.9 (*)    All other components within normal limits  BASIC METABOLIC PANEL - Abnormal; Notable for the following:    Potassium 3.0 (*)    Glucose, Bld 118 (*)    BUN 34 (*)    Creatinine, Ser 5.05 (*)    Calcium 7.4 (*)    GFR calc non Af Amer 7 (*)    GFR calc Af Amer 9 (*)    All other components within normal limits  CBC - Abnormal; Notable for the following:    RBC 3.01 (*)    Hemoglobin 8.5 (*)    HCT 26.9 (*)    All other components within normal limits  BASIC METABOLIC PANEL - Abnormal; Notable for the following:    Potassium 2.9 (*)    Glucose, Bld 104 (*)    BUN 33 (*)    Creatinine, Ser 5.09 (*)    Calcium 7.2 (*)    GFR calc non Af Amer 7 (*)    GFR calc Af Amer 9 (*)    All other components within normal limits  CBC - Abnormal; Notable for the following:    RBC 3.08 (*)    Hemoglobin 8.7 (*)    HCT 27.5 (*)    All other components within normal limits  BASIC METABOLIC PANEL - Abnormal; Notable for the following:    Potassium 2.8 (*)    Glucose, Bld 107 (*)    BUN 35 (*)     Creatinine, Ser 5.08 (*)    Calcium 7.3 (*)    GFR calc non Af Amer 7 (*)    GFR calc Af Amer 9 (*)    All other components within normal limits  BASIC METABOLIC PANEL - Abnormal; Notable for the following:    Potassium 3.2 (*)    Glucose, Bld 100 (*)    BUN 37 (*)    Creatinine, Ser 4.85 (*)    Calcium 7.6 (*)    GFR calc non Af Amer 8 (*)    GFR calc Af Amer 9 (*)    All other components within normal limits  BASIC METABOLIC PANEL - Abnormal; Notable for the following:    Potassium 3.4 (*)    Chloride 99 (*)    Glucose, Bld 115 (*)    BUN 39 (*)    Creatinine, Ser 4.47 (*)  Calcium 8.0 (*)    GFR calc non Af Amer 9 (*)    GFR calc Af Amer 10 (*)    All other components within normal limits  CBC - Abnormal; Notable for the following:    WBC 11.3 (*)    RBC 3.37 (*)    Hemoglobin 9.6 (*)    HCT 29.9 (*)    All other components within normal limits  TROPONIN I - Abnormal; Notable for the following:    Troponin I 0.06 (*)    All other components within normal limits  TROPONIN I - Abnormal; Notable for the following:    Troponin I 0.06 (*)    All other components within normal limits  TROPONIN I - Abnormal; Notable for the following:    Troponin I 0.06 (*)    All other components within normal limits  URINALYSIS, ROUTINE W REFLEX MICROSCOPIC (NOT AT [facility]) - Abnormal; Notable for the following:    APPearance CLOUDY (*)    Hgb urine dipstick TRACE (*)    Protein, ur 30 (*)    All other components within normal limits  CBC - Abnormal; Notable for the following:    WBC 17.9 (*)    RBC 3.02 (*)    Hemoglobin 8.6 (*)    HCT 27.4 (*)    All other components within normal limits  BASIC METABOLIC PANEL - Abnormal; Notable for the following:    Sodium 133 (*)    Chloride 98 (*)    Glucose, Bld 104 (*)    BUN 43 (*)    Creatinine, Ser 4.67 (*)    Calcium 8.1 (*)    GFR calc non Af Amer 8 (*)    GFR calc Af Amer 9 (*)    All other components within normal limits   PROTIME-INR - Abnormal; Notable for the following:    Prothrombin Time 16.4 (*)    All other components within normal limits  URINE MICROSCOPIC-ADD ON - Abnormal; Notable for the following:    Squamous Epithelial / LPF 6-30 (*)    Bacteria, UA FEW (*)    Casts HYALINE CASTS (*)    All other components within normal limits  CBC - Abnormal; Notable for the following:    WBC 15.0 (*)    RBC 3.08 (*)    Hemoglobin 8.7 (*)    HCT 27.9 (*)    Platelets 410 (*)    All other components within normal limits  RENAL FUNCTION PANEL - Abnormal; Notable for the following:    Sodium 133 (*)    Chloride 96 (*)    Glucose, Bld 117 (*)    BUN 62 (*)    Creatinine, Ser 5.65 (*)    Calcium 7.8 (*)    Phosphorus 5.2 (*)    Albumin 2.4 (*)    GFR calc non Af Amer 6 (*)    GFR calc Af Amer 8 (*)    All other components within normal limits  SEDIMENTATION RATE - Abnormal; Notable for the following:    Sed Rate 122 (*)    All other components within normal limits  C-REACTIVE PROTEIN - Abnormal; Notable for the following:    CRP 34.1 (*)    All other components within normal limits  RHEUMATOID FACTOR - Abnormal; Notable for the following:    Rhuematoid fact SerPl-aCnc 21.3 (*)    All other components within normal limits  RENAL FUNCTION PANEL - Abnormal; Notable for the following:    Chloride  99 (*)    BUN 65 (*)    Creatinine, Ser 6.04 (*)    Calcium 7.8 (*)    Phosphorus 5.9 (*)    Albumin 2.3 (*)    GFR calc non Af Amer 6 (*)    GFR calc Af Amer 7 (*)    All other components within normal limits  CBC WITH DIFFERENTIAL/PLATELET - Abnormal; Notable for the following:    WBC 11.5 (*)    RBC 2.83 (*)    Hemoglobin 8.1 (*)    HCT 26.1 (*)    Neutro Abs 9.6 (*)    All other components within normal limits  COMPLEMENT, TOTAL - Abnormal; Notable for the following:    Compl, Total (CH50) >60 (*)    All other components within normal limits  CBC - Abnormal; Notable for the following:    RBC  2.95 (*)    Hemoglobin 8.3 (*)    HCT 27.3 (*)    Platelets 459 (*)    All other components within normal limits  BASIC METABOLIC PANEL - Abnormal; Notable for the following:    Chloride 98 (*)    CO2 21 (*)    Glucose, Bld 135 (*)    BUN 78 (*)    Creatinine, Ser 6.47 (*)    Calcium 8.1 (*)    GFR calc non Af Amer 5 (*)    GFR calc Af Amer 6 (*)    Anion gap 17 (*)    All other components within normal limits  GLUCOSE, CAPILLARY - Abnormal; Notable for the following:    Glucose-Capillary 131 (*)    All other components within normal limits  GLUCOSE, CAPILLARY - Abnormal; Notable for the following:    Glucose-Capillary 185 (*)    All other components within normal limits  GLUCOSE, CAPILLARY - Abnormal; Notable for the following:    Glucose-Capillary 121 (*)    All other components within normal limits  CBC - Abnormal; Notable for the following:    WBC 12.8 (*)    RBC 2.97 (*)    Hemoglobin 8.6 (*)    HCT 27.8 (*)    Platelets 452 (*)    All other components within normal limits  RENAL FUNCTION PANEL - Abnormal; Notable for the following:    Chloride 97 (*)    CO2 21 (*)    Glucose, Bld 107 (*)    BUN 89 (*)    Creatinine, Ser 6.38 (*)    Calcium 7.7 (*)    Phosphorus 6.5 (*)    Albumin 2.5 (*)    GFR calc non Af Amer 6 (*)    GFR calc Af Amer 6 (*)    Anion gap 17 (*)    All other components within normal limits  GLUCOSE, CAPILLARY - Abnormal; Notable for the following:    Glucose-Capillary 162 (*)    All other components within normal limits  GLUCOSE, CAPILLARY - Abnormal; Notable for the following:    Glucose-Capillary 112 (*)    All other components within normal limits  GLUCOSE, CAPILLARY - Abnormal; Notable for the following:    Glucose-Capillary 163 (*)    All other components within normal limits  RENAL FUNCTION PANEL - Abnormal; Notable for the following:    Chloride 99 (*)    Glucose, Bld 107 (*)    BUN 87 (*)    Creatinine, Ser 6.21 (*)    Calcium  7.5 (*)    Phosphorus 6.1 (*)  Albumin 2.5 (*)    GFR calc non Af Amer 6 (*)    GFR calc Af Amer 7 (*)    All other components within normal limits  CBC - Abnormal; Notable for the following:    WBC 11.5 (*)    RBC 3.06 (*)    Hemoglobin 8.7 (*)    HCT 28.6 (*)    Platelets 496 (*)    All other components within normal limits  SEDIMENTATION RATE - Abnormal; Notable for the following:    Sed Rate 117 (*)    All other components within normal limits  GLUCOSE, CAPILLARY - Abnormal; Notable for the following:    Glucose-Capillary 150 (*)    All other components within normal limits  GLUCOSE, CAPILLARY - Abnormal; Notable for the following:    Glucose-Capillary 126 (*)    All other components within normal limits  PARATHYROID HORMONE, INTACT (NO CA) - Abnormal; Notable for the following:    PTH 352 (*)    All other components within normal limits  GLUCOSE, CAPILLARY - Abnormal; Notable for the following:    Glucose-Capillary 108 (*)    All other components within normal limits  RENAL FUNCTION PANEL - Abnormal; Notable for the following:    Glucose, Bld 107 (*)    BUN 83 (*)    Creatinine, Ser 5.66 (*)    Calcium 8.0 (*)    Phosphorus 6.1 (*)    Albumin 2.6 (*)    GFR calc non Af Amer 6 (*)    GFR calc Af Amer 7 (*)    All other components within normal limits  CBC - Abnormal; Notable for the following:    WBC 12.2 (*)    RBC 3.26 (*)    Hemoglobin 9.3 (*)    HCT 30.7 (*)    Platelets 478 (*)    All other components within normal limits  GLUCOSE, CAPILLARY - Abnormal; Notable for the following:    Glucose-Capillary 144 (*)    All other components within normal limits  GLUCOSE, CAPILLARY - Abnormal; Notable for the following:    Glucose-Capillary 112 (*)    All other components within normal limits  GLUCOSE, CAPILLARY - Abnormal; Notable for the following:    Glucose-Capillary 107 (*)    All other components within normal limits  GLUCOSE, CAPILLARY - Abnormal; Notable  for the following:    Glucose-Capillary 121 (*)    All other components within normal limits  RENAL FUNCTION PANEL - Abnormal; Notable for the following:    CO2 21 (*)    Glucose, Bld 183 (*)    BUN 77 (*)    Creatinine, Ser 5.40 (*)    Calcium 7.8 (*)    Phosphorus 5.6 (*)    Albumin 2.6 (*)    GFR calc non Af Amer 7 (*)    GFR calc Af Amer 8 (*)    All other components within normal limits  CBC - Abnormal; Notable for the following:    WBC 12.8 (*)    RBC 3.20 (*)    Hemoglobin 9.1 (*)    HCT 30.1 (*)    Platelets 431 (*)    All other components within normal limits  GLUCOSE, CAPILLARY - Abnormal; Notable for the following:    Glucose-Capillary 129 (*)    All other components within normal limits  GLUCOSE, CAPILLARY - Abnormal; Notable for the following:    Glucose-Capillary 120 (*)    All other components within normal limits  GLUCOSE, CAPILLARY - Abnormal; Notable for the following:    Glucose-Capillary 227 (*)    All other components within normal limits  GLUCOSE, CAPILLARY - Abnormal; Notable for the following:    Glucose-Capillary 109 (*)    All other components within normal limits  CBC - Abnormal; Notable for the following:    WBC 13.6 (*)    RBC 3.14 (*)    Hemoglobin 9.0 (*)    HCT 29.5 (*)    RDW 15.7 (*)    Platelets 409 (*)    All other components within normal limits  BASIC METABOLIC PANEL - Abnormal; Notable for the following:    Glucose, Bld 119 (*)    BUN 68 (*)    Creatinine, Ser 5.03 (*)    Calcium 8.1 (*)    GFR calc non Af Amer 7 (*)    GFR calc Af Amer 9 (*)    All other components within normal limits  GLUCOSE, CAPILLARY - Abnormal; Notable for the following:    Glucose-Capillary 127 (*)    All other components within normal limits  GLUCOSE, CAPILLARY - Abnormal; Notable for the following:    Glucose-Capillary 103 (*)    All other components within normal limits  GLUCOSE, CAPILLARY - Abnormal; Notable for the following:     Glucose-Capillary 108 (*)    All other components within normal limits  I-STAT TROPOININ, ED - Abnormal; Notable for the following:    Troponin i, poc 0.22 (*)    All other components within normal limits  POCT I-STAT 3, ART BLOOD GAS (G3+) - Abnormal; Notable for the following:    pO2, Arterial 60.0 (*)    Bicarbonate 19.9 (*)    Acid-base deficit 5.0 (*)    All other components within normal limits  POCT I-STAT 3, VENOUS BLOOD GAS (G3P V) - Abnormal; Notable for the following:    pH, Ven 7.365 (*)    pCO2, Ven 41.0 (*)    pO2, Ven 28.0 (*)    All other components within normal limits  POCT I-STAT 3, VENOUS BLOOD GAS (G3P V) - Abnormal; Notable for the following:    pH, Ven 7.362 (*)    pCO2, Ven 41.4 (*)    pO2, Ven 27.0 (*)    All other components within normal limits  CULTURE, BLOOD (ROUTINE X 2)  CULTURE, BLOOD (ROUTINE X 2)  CULTURE, EXPECTORATED SPUTUM-ASSESSMENT  CULTURE, RESPIRATORY (NON-EXPECTORATED)  GRAM STAIN  INFLUENZA PANEL BY PCR (TYPE A & B, H1N1)  STREP PNEUMONIAE URINARY ANTIGEN  TSH  FOLATE  FERRITIN  PROCALCITONIN  PROCALCITONIN  PROCALCITONIN  ANGIOTENSIN CONVERTING ENZYME  ANCA TITERS  C3 COMPLEMENT  C4 COMPLEMENT  GLUCOSE, CAPILLARY  GLUCOSE, CAPILLARY  ANTINUCLEAR ANTIBODIES, IFA  ANTI-JO 1 ANTIBODY, IGG  ANTI-SCLERODERMA ANTIBODY  ANTI-SMITH ANTIBODY  HIV ANTIBODY (ROUTINE TESTING)  CYCLIC CITRUL PEPTIDE ANTIBODY, IGG/IGA  TYPE AND SCREEN  PREPARE RBC (CROSSMATCH)    Imaging Review No results found. I have personally reviewed and evaluated these images and lab results as part of my medical decision-making.   EKG Interpretation   Date/Time:  Wednesday Aug 01 2015 12:28:09 EDT Ventricular Rate:  94 PR Interval:  166 QRS Duration: 76 QT Interval:  402 QTC Calculation: 502 R Axis:   87 Text Interpretation:  Sinus rhythm with Premature supraventricular  complexes Prolonged QT Abnormal ECG Since last tracing rate faster  Otherwise  no significant change Confirmed by Shenaya Lebo MD, DANIEL [id number]) on  08/01/2015 1:29:44 PM  MDM   Final diagnoses:  Cough  Dyspnea  CHF (congestive heart failure) (HCC)  Chest pain  Chest pain on breathing  Bilateral pulmonary infiltrates on chest x-ray  Pneumonitis  SOB (shortness of breath)    81 yo F with sob on exertion, ecg at baseline, cards consulted, will admit.   The patients results and plan were reviewed and discussed.   Any x-rays performed were independently reviewed by myself.   Differential diagnosis were considered with the presenting HPI.  Medications  furosemide (LASIX) injection 40 mg (40 mg Intravenous Given 08/01/15 1456)  albuterol (PROVENTIL) (2.5 MG/3ML) 0.083% nebulizer solution 2.5 mg (2.5 mg Nebulization Given 08/01/15 1803)  furosemide (LASIX) injection 40 mg (40 mg Intravenous Given 08/01/15 1735)  loperamide (IMODIUM) capsule 2 mg (2 mg Oral Given 08/03/15 1502)  0.9 %  sodium chloride infusion ( Intravenous New Bag/Given 08/03/15 1648)  furosemide (LASIX) injection 40 mg (40 mg Intravenous Given 08/03/15 1753)  potassium chloride (K-DUR,KLOR-CON) CR tablet 30 mEq (30 mEq Oral Given 08/04/15 1158)  potassium chloride SA (K-DUR,KLOR-CON) CR tablet 40 mEq (40 mEq Oral Given 08/05/15 1200)  potassium chloride SA (K-DUR,KLOR-CON) CR tablet 20 mEq (20 mEq Oral Given 08/06/15 0855)  ferumoxytol (FERAHEME) 510 mg in sodium chloride 0.9 % 100 mL IVPB (510 mg Intravenous Given 08/06/15 1252)  nitroGLYCERIN (NITROSTAT) 0.4 MG SL tablet (0.4 mg  Given by Other 08/07/15 0318)  aspirin chewable tablet 324 mg (324 mg Oral Given 08/07/15 0402)  morphine 2 MG/ML injection 2 mg (2 mg Intravenous Given 08/07/15 0403)  technetium albumin aggregated (MAA) injection solution 4 milli Curie (4 milli Curies Intravenous Contrast Given 08/07/15 1108)  aspirin chewable tablet 81 mg (81 mg Oral Given 08/08/15 0559)  sodium polystyrene (KAYEXALATE) 15 GM/60ML suspension 30 g (30 g Oral Given  08/11/15 1328)    Filed Vitals:   08/16/15 1125 08/16/15 1130 08/16/15 1135 08/16/15 1418  BP:    131/67  Pulse:    75  Temp:    97.8 F (36.6 C)  TempSrc:    Oral  Resp:    18  Weight:      SpO2: 95% 83% 93% 97%    Final diagnoses:  Cough  Dyspnea  CHF (congestive heart failure) (HCC)  Chest pain  Chest pain on breathing  Bilateral pulmonary infiltrates on chest x-ray  Pneumonitis  SOB (shortness of breath)    Admission/ observation were discussed with the admitting physician, patient and/or family and they are comfortable with the plan.      Deno Etienne, DO 08/30/15 1607

## 2015-08-31 ENCOUNTER — Inpatient Hospital Stay (HOSPITAL_COMMUNITY): Payer: Medicare HMO

## 2015-08-31 ENCOUNTER — Inpatient Hospital Stay (HOSPITAL_COMMUNITY): Payer: Medicare HMO | Admitting: Anesthesiology

## 2015-08-31 ENCOUNTER — Encounter (HOSPITAL_COMMUNITY): Payer: Self-pay | Admitting: *Deleted

## 2015-08-31 ENCOUNTER — Encounter (HOSPITAL_COMMUNITY): Admission: EM | Disposition: E | Payer: Self-pay | Source: Home / Self Care | Attending: Family Medicine

## 2015-08-31 HISTORY — PX: CENTRAL VENOUS CATHETER INSERTION: SHX401

## 2015-08-31 LAB — CBC
HCT: 29.4 % — ABNORMAL LOW (ref 36.0–46.0)
HEMOGLOBIN: 9.7 g/dL — AB (ref 12.0–15.0)
MCH: 31.5 pg (ref 26.0–34.0)
MCHC: 33 g/dL (ref 30.0–36.0)
MCV: 95.5 fL (ref 78.0–100.0)
PLATELETS: 201 10*3/uL (ref 150–400)
RBC: 3.08 MIL/uL — AB (ref 3.87–5.11)
RDW: 17 % — ABNORMAL HIGH (ref 11.5–15.5)
WBC: 11.3 10*3/uL — AB (ref 4.0–10.5)

## 2015-08-31 LAB — BASIC METABOLIC PANEL
ANION GAP: 10 (ref 5–15)
BUN: 97 mg/dL — ABNORMAL HIGH (ref 6–20)
CHLORIDE: 97 mmol/L — AB (ref 101–111)
CO2: 25 mmol/L (ref 22–32)
CREATININE: 6.08 mg/dL — AB (ref 0.44–1.00)
Calcium: 8.4 mg/dL — ABNORMAL LOW (ref 8.9–10.3)
GFR calc non Af Amer: 6 mL/min — ABNORMAL LOW (ref >60)
GFR, EST AFRICAN AMERICAN: 7 mL/min — AB (ref >60)
Glucose, Bld: 105 mg/dL — ABNORMAL HIGH (ref 65–99)
POTASSIUM: 4.8 mmol/L (ref 3.5–5.1)
SODIUM: 132 mmol/L — AB (ref 135–145)

## 2015-08-31 LAB — BLOOD GAS, ARTERIAL
Acid-Base Excess: 5.8 mmol/L — ABNORMAL HIGH (ref 0.0–2.0)
Bicarbonate: 29.7 mEq/L — ABNORMAL HIGH (ref 20.0–24.0)
FIO2: 60
O2 SAT: 90.2 %
PCO2 ART: 36.9 mmHg (ref 35.0–45.0)
pH, Arterial: 7.507 — ABNORMAL HIGH (ref 7.350–7.450)
pO2, Arterial: 58.5 mmHg — ABNORMAL LOW (ref 80.0–100.0)

## 2015-08-31 LAB — SURGICAL PCR SCREEN
MRSA, PCR: NEGATIVE
STAPHYLOCOCCUS AUREUS: NEGATIVE

## 2015-08-31 SURGERY — INSERTION, CATHETER, CENTRAL VENOUS, HICKMAN
Anesthesia: Monitor Anesthesia Care | Laterality: Right

## 2015-08-31 MED ORDER — HEPARIN SODIUM (PORCINE) 1000 UNIT/ML IJ SOLN
INTRAMUSCULAR | Status: AC
  Filled 2015-08-31: qty 5

## 2015-08-31 MED ORDER — FENTANYL CITRATE (PF) 100 MCG/2ML IJ SOLN: 25.0000 ug | INTRAMUSCULAR | Status: DC | PRN

## 2015-08-31 MED ORDER — LIDOCAINE HCL (PF) 1 % IJ SOLN
INTRAMUSCULAR | Status: AC
Start: 2015-08-31 — End: 2015-08-31
  Filled 2015-08-31: qty 30

## 2015-08-31 MED ORDER — LIDOCAINE HCL (PF) 1 % IJ SOLN
INTRAMUSCULAR | Status: AC
  Filled 2015-08-31: qty 5

## 2015-08-31 MED ORDER — SODIUM CHLORIDE 0.9 % IJ SOLN
INTRAMUSCULAR | Status: AC
  Filled 2015-08-31: qty 10

## 2015-08-31 MED ORDER — SODIUM CHLORIDE 0.9 % IV SOLN
Freq: Once | INTRAVENOUS | Status: DC
  Filled 2015-08-31: qty 1.2

## 2015-08-31 MED ORDER — FENTANYL CITRATE (PF) 100 MCG/2ML IJ SOLN
INTRAMUSCULAR | Status: AC
  Filled 2015-08-31: qty 2

## 2015-08-31 MED ORDER — FENTANYL CITRATE (PF) 100 MCG/2ML IJ SOLN
25.0000 ug | INTRAMUSCULAR | Status: AC | PRN
  Administered 2015-08-31: 12.5 ug via INTRAVENOUS
  Administered 2015-08-31: 25 ug via INTRAVENOUS

## 2015-08-31 MED ORDER — SODIUM CHLORIDE 0.9 % IV SOLN: 100.0000 mL | INTRAVENOUS | Status: DC | PRN

## 2015-08-31 MED ORDER — SODIUM CHLORIDE 0.9 % IV SOLN
INTRAVENOUS | Status: DC
  Administered 2015-08-31: 10:00:00 via INTRAVENOUS

## 2015-08-31 MED ORDER — HEPARIN SODIUM (PORCINE) 5000 UNIT/ML IJ SOLN
INTRAMUSCULAR | Status: DC | PRN
  Administered 2015-08-31: 500 mL

## 2015-08-31 MED ORDER — HEPARIN SODIUM (PORCINE) 1000 UNIT/ML IJ SOLN
INTRAMUSCULAR | Status: AC
  Administered 2015-08-31: 3200 [IU] via INTRAVENOUS_CENTRAL
  Filled 2015-08-31: qty 4

## 2015-08-31 MED ORDER — EPOETIN ALFA 10000 UNIT/ML IJ SOLN
INTRAMUSCULAR | Status: AC
  Administered 2015-08-31: 6000 [IU] via INTRAVENOUS
  Filled 2015-08-31: qty 1

## 2015-08-31 MED ORDER — LABETALOL HCL 5 MG/ML IV SOLN
INTRAVENOUS | Status: AC
  Filled 2015-08-31: qty 4

## 2015-08-31 MED ORDER — MIDAZOLAM HCL 2 MG/2ML IJ SOLN
INTRAMUSCULAR | Status: AC
  Filled 2015-08-31: qty 2

## 2015-08-31 MED ORDER — CALCIUM CARBONATE ANTACID 500 MG PO CHEW
1.0000 | CHEWABLE_TABLET | Freq: Two times a day (BID) | ORAL | Status: DC | PRN
  Administered 2015-08-31: 200 mg via ORAL
  Filled 2015-08-31: qty 1

## 2015-08-31 MED ORDER — LIDOCAINE HCL (CARDIAC) 20 MG/ML IV SOLN
INTRAVENOUS | Status: DC | PRN
  Administered 2015-08-31: 40 mg via INTRAVENOUS

## 2015-08-31 MED ORDER — PROPOFOL 500 MG/50ML IV EMUL
INTRAVENOUS | Status: DC | PRN
  Administered 2015-08-31: 25 ug/kg/min via INTRAVENOUS

## 2015-08-31 MED ORDER — MIDAZOLAM HCL 2 MG/2ML IJ SOLN: 1.0000 mg | INTRAMUSCULAR | Status: DC | PRN

## 2015-08-31 MED ORDER — HEPARIN SODIUM (PORCINE) 1000 UNIT/ML IJ SOLN
INTRAMUSCULAR | Status: DC | PRN
  Administered 2015-08-31: 4000 [IU] via INTRAVENOUS

## 2015-08-31 MED ORDER — BUPIVACAINE HCL (PF) 0.5 % IJ SOLN
INTRAMUSCULAR | Status: AC
  Filled 2015-08-31: qty 30

## 2015-08-31 MED ORDER — ONDANSETRON HCL 4 MG/2ML IJ SOLN: 4.0000 mg | Freq: Once | INTRAMUSCULAR | Status: DC | PRN

## 2015-08-31 MED ORDER — SODIUM CHLORIDE 0.9 % IV SOLN
INTRAVENOUS | Status: DC | PRN
  Administered 2015-08-31: 500 mL via INTRAMUSCULAR

## 2015-08-31 MED ORDER — LIDOCAINE HCL 1 % IJ SOLN
INTRAMUSCULAR | Status: DC | PRN
  Administered 2015-08-31: 8.5 mL

## 2015-08-31 SURGICAL SUPPLY — 50 items
BAG DECANTER FOR FLEXI CONT (MISCELLANEOUS) ×5 IMPLANT
BAG HAMPER (MISCELLANEOUS) ×3 IMPLANT
BIOPATCH RED 1 DISK 7.0 (GAUZE/BANDAGES/DRESSINGS) ×2 IMPLANT
BIOPATCH RED 1IN DISK 7.0MM (GAUZE/BANDAGES/DRESSINGS) ×1
CATH PALINDROME RT-P 15FX19CM (CATHETERS) IMPLANT
CATH PALINDROME RT-P 15FX23CM (CATHETERS) IMPLANT
CATH PALINDROME RT-P 15FX28CM (CATHETERS) IMPLANT
CATH PALINDROME-P 19CM W/VT (CATHETERS) ×2 IMPLANT
CHLORAPREP W/TINT 10.5 ML (MISCELLANEOUS) ×6 IMPLANT
CLOSURE STERI-STRIP 1/2X4 (GAUZE/BANDAGES/DRESSINGS)
CLSR STERI-STRIP ANTIMIC 1/2X4 (GAUZE/BANDAGES/DRESSINGS) ×1 IMPLANT
COVER PROBE W GEL 5X96 (DRAPES) IMPLANT
COVER SURGICAL LIGHT HANDLE (MISCELLANEOUS) ×6 IMPLANT
DECANTER SPIKE VIAL GLASS SM (MISCELLANEOUS) ×10 IMPLANT
DRAPE C-ARM FOLDED MOBILE STRL (DRAPES) ×3 IMPLANT
DRAPE CHEST BREAST 15X10 FENES (DRAPES) ×3 IMPLANT
DRSG SORBAVIEW 3.5X5-5/16 MED (GAUZE/BANDAGES/DRESSINGS) ×2 IMPLANT
GAUZE SPONGE 4X4 16PLY XRAY LF (GAUZE/BANDAGES/DRESSINGS) ×3 IMPLANT
GLOVE BIOGEL PI IND STRL 7.0 (GLOVE) IMPLANT
GLOVE BIOGEL PI IND STRL 7.5 (GLOVE) ×1 IMPLANT
GLOVE BIOGEL PI INDICATOR 7.0 (GLOVE) ×2
GLOVE BIOGEL PI INDICATOR 7.5 (GLOVE) ×2
GLOVE ECLIPSE 6.5 STRL STRAW (GLOVE) ×2 IMPLANT
GLOVE ECLIPSE 7.0 STRL STRAW (GLOVE) ×3 IMPLANT
GLOVE EXAM NITRILE MD LF STRL (GLOVE) ×4 IMPLANT
GOWN STRL REUS W/TWL LRG LVL3 (GOWN DISPOSABLE) ×3 IMPLANT
GOWN STRL REUS W/TWL XL LVL3 (GOWN DISPOSABLE) ×3 IMPLANT
IV NS 500ML (IV SOLUTION) ×3
IV NS 500ML BAXH (IV SOLUTION) ×1 IMPLANT
KIT BLADEGUARD II DBL (SET/KITS/TRAYS/PACK) ×3 IMPLANT
KIT ROOM TURNOVER APOR (KITS) ×3 IMPLANT
MARKER SKIN DUAL TIP RULER LAB (MISCELLANEOUS) ×3 IMPLANT
NDL HYPO 25X1 1.5 SAFETY (NEEDLE) ×1 IMPLANT
NDL SAFETY ECLIPSE 18X1.5 (NEEDLE) IMPLANT
NEEDLE HYPO 18GX1.5 SHARP (NEEDLE) ×3
NEEDLE HYPO 25X1 1.5 SAFETY (NEEDLE) ×3 IMPLANT
NS IRRIG 1000ML POUR BTL (IV SOLUTION) ×1 IMPLANT
PACK BASIC III (CUSTOM PROCEDURE TRAY) ×3
PACK SRG BSC III STRL LF ECLPS (CUSTOM PROCEDURE TRAY) ×1 IMPLANT
PACK SURGICAL SETUP 50X90 (CUSTOM PROCEDURE TRAY) ×3 IMPLANT
PAD ARMBOARD 7.5X6 YLW CONV (MISCELLANEOUS) ×4 IMPLANT
SET BASIN LINEN APH (SET/KITS/TRAYS/PACK) ×3 IMPLANT
SUT MNCRL AB 4-0 PS2 18 (SUTURE) ×3 IMPLANT
SUT SILK 2 0 FSL 18 (SUTURE) ×3 IMPLANT
SYR 20CC LL (SYRINGE) ×3 IMPLANT
SYR 5ML LL (SYRINGE) ×3 IMPLANT
SYR CONTROL 10ML LL (SYRINGE) ×3 IMPLANT
SYRINGE 10CC LL (SYRINGE) ×3 IMPLANT
TOWEL OR 17X26 4PK STRL BLUE (TOWEL DISPOSABLE) ×3 IMPLANT
WATER STERILE IRR 1000ML POUR (IV SOLUTION) ×1 IMPLANT

## 2015-08-31 NOTE — Progress Notes (Signed)
Subjective: Interval History:  Patient continued to improve. Her appetite is still poor. She doesn't have any nausea or vomiting.  Objective: Vital signs in last 24 hours: Temp:  [97.4 F (36.3 C)-98 F (36.7 C)] 98 F (36.7 C) (06/16 0829) Pulse Rate:  [70-88] 71 (06/15 2200) Resp:  [16-18] 16 (06/15 2200) BP: (116-119)/(52-55) 119/55 mmHg (06/15 2200) SpO2:  [92 %-98 %] 98 % (06/16 0719) Weight:  [58 kg (127 lb 13.9 oz)] 58 kg (127 lb 13.9 oz) (06/16 0650) Weight change: 5.111 kg (11 lb 4.3 oz)  Intake/Output from previous day: 06/15 0701 - 06/16 0700 In: 240 [P.O.:240] Out: -  Intake/Output this shift:    Generally patient is alert and in no apparent distress dialysis  Chest: auscultation Heart exam revealed regular rate and rhythm no murmur nor S3 Extremities no edema  Lab Results:  Recent Labs  08/29/15 0546  WBC 7.5  HGB 10.6*  HCT 32.5*  PLT 163   BMET:   Recent Labs  08/30/15 0537 09/12/2015 0548  NA 133* 132*  K 4.9 4.8  CL 97* 97*  CO2 24 25  GLUCOSE 136* 105*  BUN 81* 97*  CREATININE 5.69* 6.08*  CALCIUM 8.5* 8.4*   No results for input(s): PTH in the last 72 hours. Iron Studies:  No results for input(s): IRON, TIBC, TRANSFERRIN, FERRITIN in the last 72 hours.  Studies/Results: No results found.  I have reviewed the patient's current medications.  Assessment/Plan: Problem #1 End-stage renal disease: They still continued to have problem with her fistula. Patient was not able to get dialysis yesterday. Presently patient will have a tunneled catheter and then we'll dialyze her after that. Problem #2 difficulty breathing: possibly from interstial  Lung disease. Patient remains on oxygen . Patient however seems to be progressively improving. Problem #3 Metabolic bone disease: Her calcium has normalized but her phosphorus  High. Patient presently on a binder and her phosphorus is improving. Problem #4 anemia: Her hemoglobin is stable..  Problem #5  history of abdominal aortic aneurysm Problem #6 metabolic acidosis: Her CO2 has corrected. Plan: 1] Will dialyze patient for 31/2 today once tunneled catheter is placed. 2] will check her renal panel and CBC in the morning. 3] will try to remove 1 liters  if her blood pressure tolerates.     LOS: 11 days   [name] S 08/23/2015,8:57 AM

## 2015-08-31 NOTE — Progress Notes (Signed)
Pt will be transferred to [facility] room 22 via [facility].  [facility] reports they cannot transfer until after 7:30 with no definite time given.  Report was called to Jairo Ben at [facility].  Updates will be given to on-coming nurse at [facility]

## 2015-08-31 NOTE — Op Note (Signed)
SURGICAL PROCEDURE REPORT  DATE OF PROCEDURE: 09/04/2015   ATTENDING Surgeon(s): Vickie Epley, MD   ASSISTANTS: None   ANESTHESIA: Local with light IV sedation   PRE-OPERATIVE DIAGNOSIS: ESRD requiring functional hemodialysis access   POST-OPERATIVE DIAGNOSIS: ESRD requiring functional hemodialysis access   PROCEDURE(S):  1.) Percutaneous access of Right internal jugular vein under ultrasound guidance  2.) Insertion of Right internal jugular cuffed tunneled Covidien Palendrome hemodialysis catheter   INTRAOPERATIVE FINDINGS: Patent easily compressible Right internal jugular vein with appropriate respiratory variations and well-secured cuffed tunneled hemodialysis catheter at completion of the procedure  INTRAOPERATIVE FLUIDS: 200 mL crystalloid  FLUOROSCOPY: 8 seconds  ESTIMATED BLOOD LOSS: Minimal (<20 mL)   SPECIMENS: None   IMPLANTS: 19 cm 14.74F cuffed tunneled Covidien Palendrome hemodialysis catheter   DRAINS: None   COMPLICATIONS: None apparent   CONDITION AT COMPLETION: Hemodynamically stable, awake   DISPOSITION: Recovery followed by hemodialysis   INDICATION(S) FOR PROCEDURE:  Patient is a 81 y.o. female with ESRD and AV fistula dysfunction requiring access for hemodialysis. All risks, benefits, and alternatives to above elective procedures were discussed with the patient, who elected to proceed, and informed consent was accordingly obtained at that time.  DETAILS OF PROCEDURE:  Patient was brought to the surgical suite and appropriately identified. In supine position, Right IJ venous access site was prepped and draped in the usual sterile fashion, and following a brief timeout, patient was placed in Trendelenberg position and percutaneous Right internal jugular venous access was obtained under ultrasound guidance using Seldinger technique, by which local anesthetic was injected over the Right IJ vein and access needle was inserted into the Right IJ vein,  through which soft guidewire was advanced through the SVC into the proximal IVC and secured to the sterile drapes. Local anesthetic was injected over the Right chest catheter tunnel site, through which catheter was tunneled retrograde towards the secured guidewire, over which sequential dilators were advanced and withdrawn, followed by insertion of sheath for tunneled hemodialysis catheter. Catheter was advanced through the sheath into Right internal jugular vein and under direct fluoroscopic visualization into the SVC and proximal IVC. Cuffed tunneled hemodialysis catheter was then secured to skin with 2-0 silk suture, and skin at insertion site was re-approximated with buried interrupted/subcuticular 4-0 Monocryl suture. Skin was cleaned, dried, and sterile occlusive dressing was applied, after which patient was safely able to be transferred to recovery, where chest x-ray was performed.   I was present for all aspects of the procedures, and there were no intraprocedural complications apparent.

## 2015-08-31 NOTE — Progress Notes (Signed)
PROGRESS NOTE    [name]  [id number] DOB: 1934/11/14 DOA: 08/25/2015 PCP: Sherrie Mustache, MD Outpatient Specialists:    Brief Narrative:  81 yof was recently hospitalized at [facility] and discharged on 6/1 after being treated for shortness of breath and associated diffuse interstitial infiltrates of unclear cause. She presented back to the ED  with complaints of SOB and found to be hypoxic. She was re admitted for respiratory failure. Patient initially required BiPAP and was started on intravenous steroids with some improvement. She was able to wean down nasal cannula, but over the last few days has had a progressive decline. She is now back on high flow oxygen at 10L.Marland Kitchen Her chronic kidney disease progressed to end stage renal disease this hospitalization and hemodialysis was initiated. Patient has undergone 3x sessions of HD, but has been having difficulty tolerating entire sessions. It does not appear that she is volume overloaded at this time. Since she does not appear to be improving from a respiratory standpoint, it was felt that she needed a pulmonology evaluation and possibly further work up. Due to lack of pulmonology coverage at [facility], she will be transferred to [facility] for further evaluation  Assessment & Plan:   Principal Problem:   Acute renal failure superimposed on stage 4 chronic kidney disease ([place]) Active Problems:   ILD (interstitial lung disease) ([place])   Interstitial lung disease ([place])   ESRD on dialysis ([place])   Acute respiratory failure with hypoxia (HCC)   Protein-calorie malnutrition, severe  1. Acute on chronic hypoxic respiratory failure, multifactorial.  Predominantly interstitial lung disease of unclear etiology. Pulmonary edema component has since resolved. She was recently started on home O2. She initially required BiPAP but initially improved and was able to wean down to [place]. Unfortunately, over the last several days she has had a progressive decline  in her respiratory status and has an increasing oxygen requirement. She has trended up on high flow nasal cannula from 4L to 8L and now on 10L. She does not have evidence of pulmonary edema or infection. Discussed with Dr. Halford Chessman on call for pulmonology at [facility] who will agree to see the patient upon transfer. 2. Acute renal failure which has progressed to end-stage renal disease. Hemodialysis initiated this hospitalization. Patient noted to have difficulty tolerating entire sessions. Unable to be dialyzed from fistula. New HD catheter placed 6/16. Patient starts becoming hypotensive once fluid is being removed.  3. Acute on chronic combined heart failure, appears compensated. LVEF 45-50%. Procardia was discontinued on previous discharge since it can contribute to V/Q mismatch. Management of volume status done with HD. 4. Suspected interstitial lung disease. Patient was seen by pulmonology earlier this month, with uncertain differential including autoimmune disease, pulmonary fibrosis, BOOP, viral pneumonitis, and pulmonary edema. Rotavirus was positive on recent respiratory panel. On her last discharge,  Pulmonology had recommended home O2 3L at rest, 4 L with exertion, Pulmicort, Brovana, and a prednisone taper. Bronchoscopy with BAL and lung biopsy were being considered as an outpatient. During this admission, she has been continued on high dose steroids without significant benefit. She may need inpatient bronchoscopy, but needs to be seen by pulmonology. Due to lack of pulmonary coverage at [facility], she will be transferred to [facility] for further evaluation. 5. Coronary artery disease. Stable. Continue aspirin and statins. 6. Anemia of chronic kidney disease, stable.  DVT prophylaxis: SCDs Code Status: Full Family Communication: Discussed with patient and family at bedside.  Disposition Plan: Transfer to  [facility] for [facility] treatments   Consultants:  Nephrology   General  surgery  Procedures:  Hemodialysis 6/11  Hemodialysis 6/13  Hemodialysis 6/16  Tunneled dialysis catheter insertion 6/16, Dr. Rosana Hoes  Antimicrobials:  None  Subjective: Patient says she feels more short of breath today. Denies cough. No chest pain  Objective: Filed Vitals:   08/28/2015 0935 08/30/2015 0940 09/12/2015 0945 08/29/2015 0950  BP: 140/79 146/80 142/76 143/81  Pulse:      Temp:      TempSrc:      Resp: 27 24 22 27   Height:      Weight:      SpO2: 96% 94% 94% 96%    Intake/Output Summary (Last 24 hours) at 09/09/2015 0956 Last data filed at 08/30/15 1200  Gross per 24 hour  Intake    120 ml  Output      0 ml  Net    120 ml   Filed Weights   08/29/15 0516 08/30/15 0454 08/21/2015 0650  Weight: 53.7 kg (118 lb 6.2 oz) 52.889 kg (116 lb 9.6 oz) 58 kg (127 lb 13.9 oz)    Examination:  General exam: Appears calm and comfortable  Respiratory system: scattered rhonchi, increased resp effort Cardiovascular system: S1 & S2 heard, RRR. No JVD, murmurs, rubs, gallops or clicks. No pedal edema. Gastrointestinal system: Abdomen is nondistended, soft and nontender. No organomegaly or masses felt. Normal bowel sounds heard. Central nervous system: Alert and oriented. No focal neurological deficits. Extremities: Symmetric 5 x 5 power. Skin: No rashes, lesions or ulcers Psychiatry: Judgement and insight appear normal. Mood & affect appropriate.    Data Reviewed: I have personally reviewed following labs and imaging studies  CBC:  Recent Labs Lab 08/25/15 0547 08/26/15 0609 08/29/15 0546  WBC 9.7 9.4 7.5  HGB 9.9* 9.8* 10.6*  HCT 29.6* 29.7* 32.5*  MCV 94.0 93.7 94.8  PLT 200 200 [id number]   Basic Metabolic Panel:  Recent Labs Lab 08/25/15 0547 08/26/15 0609 08/29/15 0548 08/30/15 0537 09/10/2015 0548  NA 134* 134* 133* 133* 132*  K 4.9 4.9 4.2 4.9 4.8  CL 98* 97* 98* 97* 97*  CO2 25 24 26 24 25   GLUCOSE 120* 118* 91 136* 105*  BUN 73* 94* 59* 81* 97*   CREATININE 4.58* 5.57* 4.52* 5.69* 6.08*  CALCIUM 7.7* 8.3* 8.2* 8.5* 8.4*  PHOS 7.4* 8.5* 6.2*  --   --    GFR: Estimated Creatinine Clearance: 6.3 mL/min (by C-G formula based on Cr of 6.08). Liver Function Tests:  Recent Labs Lab 08/25/15 0547 08/26/15 0609 08/29/15 0548  ALBUMIN 2.6* 2.6* 2.6*   Urine analysis:    Component Value Date/Time   COLORURINE YELLOW 08/08/2015 0550   APPEARANCEUR CLOUDY* 08/08/2015 0550   LABSPEC 1.015 08/08/2015 0550   PHURINE 5.5 08/08/2015 0550   GLUCOSEU NEGATIVE 08/08/2015 0550   HGBUR TRACE* 08/08/2015 0550   BILIRUBINUR NEGATIVE 08/08/2015 0550   KETONESUR NEGATIVE 08/08/2015 0550   PROTEINUR 30* 08/08/2015 0550   UROBILINOGEN 0.2 12/21/2012 1300   NITRITE NEGATIVE 08/08/2015 0550   LEUKOCYTESUR NEGATIVE 08/08/2015 0550   Sepsis Labs: @LABRCNTIP (procalcitonin:4,lacticidven:4)  ) Recent Results (from the past 240 hour(s))  Surgical pcr screen     Status: None   Collection Time: 09/14/2015  8:04 AM  Result Value Ref Range Status   MRSA, PCR NEGATIVE NEGATIVE Final   Staphylococcus aureus NEGATIVE NEGATIVE Final    Comment:        The Xpert SA Assay (FDA  approved for NASAL specimens in patients over 44 years of age), is one component of a comprehensive surveillance program.  Test performance has been validated by [facility] for patients greater than or equal to 19 year old. It is not intended to diagnose infection nor to guide or monitor treatment.     Radiology Studies: No results found. Scheduled Meds: . [MAR Hold] arformoterol  15 mcg Nebulization BID  . [MAR Hold] aspirin EC  81 mg Oral Daily  . [MAR Hold] budesonide  0.5 mg Nebulization BID  . [MAR Hold] calcitRIOL  0.25 mcg Oral Daily  . [MAR Hold] calcium acetate  2,001 mg Oral TID WC  .  ceFAZolin (ANCEF) IV  2 g Intravenous On Call to OR  . chlorhexidine  60 mL Topical Once  . [MAR Hold] citalopram  20 mg Oral Daily  . [MAR Hold] cyanocobalamin  1,000 mcg  Intramuscular Q30 days  . [MAR Hold] epoetin (EPOGEN/PROCRIT) injection  6,000 Units Intravenous Q T,Th,Sa-HD  . [MAR Hold] famotidine  20 mg Oral Daily  . [MAR Hold] feeding supplement  1 Container Oral BID BM  . [MAR Hold] fluticasone  2 spray Each Nare Daily  . [MAR Hold] guaiFENesin  1,200 mg Oral BID  . heparin 6000 unit irrigation   Irrigation On Call to OR  . heparin 6000 unit irrigation   Irrigation Once  . [MAR Hold] heparin  5,000 Units Subcutaneous Q8H  . [MAR Hold] ipratropium-albuterol  3 mL Nebulization Q6H  . [MAR Hold] levothyroxine  50 mcg Oral QAC breakfast  . [MAR Hold] methylPREDNISolone (SOLU-MEDROL) injection  60 mg Intravenous Q6H  . [MAR Hold] pantoprazole  40 mg Oral Daily  . [MAR Hold] pravastatin  80 mg Oral QHS  . [MAR Hold] senna-docusate  1 tablet Oral Daily  . [MAR Hold] sodium chloride flush  3 mL Intravenous Q12H  . [MAR Hold] sodium chloride flush  3 mL Intravenous Q12H  . [MAR Hold] tuberculin  5 Units Intradermal Once   Continuous Infusions: . sodium chloride 10 mL/hr at 08/19/2015 0953     LOS: 11 days   Time spent: 45 minutes  Kathie Dike, MD [facility] Pager [phone]  If 7PM-7AM, please contact night-coverage [email or web address] Password [facility] 09/12/2015, 9:56 AM

## 2015-08-31 NOTE — Transfer of Care (Signed)
Immediate Anesthesia Transfer of Care Note  Patient: [name]  Procedure(s) Performed: Procedure(s): Insertion of tunneled hemodialysis catheter (Right)  Patient Location: PACU  Anesthesia Type:MAC  Level of Consciousness: awake and alert   Airway & Oxygen Therapy: Patient Spontanous Breathing and Patient connected to face mask oxygen  Post-op Assessment: Report given to RN and Post -op Vital signs reviewed and stable  Post vital signs: Reviewed and stable  Last Vitals:  Filed Vitals:   08/16/2015 0945 08/30/2015 0950  BP: 142/76 143/81  Pulse:    Temp:    Resp: 22 27    Last Pain:  Filed Vitals:   09/01/2015 0953  PainSc: 0-No pain      Patients Stated Pain Goal: 2 ([id number])  Complications: No apparent anesthesia complications

## 2015-08-31 NOTE — Anesthesia Postprocedure Evaluation (Signed)
Anesthesia Post Note  Patient: [name]  Procedure(s) Performed: Procedure(s) (LRB): Insertion of tunneled hemodialysis catheter (Right)  Patient location during evaluation: PACU Anesthesia Type: MAC Level of consciousness: awake and alert and oriented Pain management: pain level controlled Vital Signs Assessment: post-procedure vital signs reviewed and stable Respiratory status: spontaneous breathing and non-rebreather facemask Cardiovascular status: stable Anesthetic complications: no    Last Vitals:  Filed Vitals:   09/03/2015 0945 08/23/2015 0950  BP: 142/76 143/81  Pulse:    Temp:    Resp: 22 27    Last Pain:  Filed Vitals:   09/04/2015 0953  PainSc: 0-No pain                 Belal Scallon

## 2015-08-31 NOTE — Clinical Social Work Note (Addendum)
CSW met with patient's daughter, who advised that she was having second thoughts on patient going to [facility] due to the distance it would be for patient's husband to drive.  She requested that CSW seek additional facilities ([place], [place], [place]). CSW faxed out clinical information to requested facilities.  CSW and patient's daughter discussed the possibility of patient being transferred to [facility] today.  Daughter became tearful and discussed that she is concerned about patient's decline.  CSW provided supportive counseling and encouragement to patient.    Rayvion Stumph, Clydene Pugh, LCSW

## 2015-08-31 NOTE — Anesthesia Preprocedure Evaluation (Signed)
Anesthesia Evaluation  Patient identified by MRN, date of birth, ID band Patient awake    Reviewed: Allergy & Precautions, NPO status , Patient's Chart, lab work & pertinent test results  Airway Mallampati: II  TM Distance: >3 FB Neck ROM: Full    Dental  (+) Teeth Intact   Pulmonary shortness of breath, former smoker,    breath sounds clear to auscultation       Cardiovascular hypertension, Pt. on medications + CAD, + Past MI and + Peripheral Vascular Disease   Rhythm:Regular Rate:Normal  50% proximal LAD stenosis, 50% marginal stenosis of the circumflex, RCA 95% stenosis, EF 40% stenosis with hypokinesis of the interior wall.  She had a Taxus stent placed in 04/16/2006   Neuro/Psych PSYCHIATRIC DISORDERS negative neurological ROS     GI/Hepatic negative GI ROS, Neg liver ROS,   Endo/Other  Hypothyroidism   Renal/GU ESRFRenal disease  negative genitourinary   Musculoskeletal negative musculoskeletal ROS (+)   Abdominal   Peds negative pediatric ROS (+)  Hematology   Anesthesia Other Findings - AAA - Renal A Stenosis s/p stent  Reproductive/Obstetrics negative OB ROS                             Anesthesia Physical Anesthesia Plan  ASA: III  Anesthesia Plan: MAC   Post-op Pain Management:    Induction: Intravenous  Airway Management Planned: Simple Face Mask  Additional Equipment:   Intra-op Plan:   Post-operative Plan:   Informed Consent: I have reviewed the patients History and Physical, chart, labs and discussed the procedure including the risks, benefits and alternatives for the proposed anesthesia with the patient or authorized representative who has indicated his/her understanding and acceptance.   Dental advisory given  Plan Discussed with: CRNA  Anesthesia Plan Comments:         Anesthesia Quick Evaluation

## 2015-08-31 NOTE — Progress Notes (Signed)
[facility] progress note. Chief complaint. Transfer note. History of present illness. This 81 year old female in hospital at [facility] with recurrent hospitalization for shortness of breath and hypoxia. Chronic kidney disease progressed and the patient was initiated on hemodialysis. Her respiratory status has decline over the past 24 to 48 hours and the patient was transferred to [facility] for pulmonology consult. Patient has now arrived and i am seeing her at bedside to ensure she remains clinically stable and her orders transferred here appropriately. Physical exam. Vital signs.temperature 98.3, pulse 67, respiration 22, blood pressure 107/60. O2 sats 100%on non-rebreather mask. General appearance. Frail elderly female who is alert and in no distress. Cardiac. Regular rate and rhythm. Lungs. Crackles in the bases bilaterally. Abdomen. Soft with positive bowel sounds. Impression/plan. Problem #1. Acute on chronic respiratory failure. Patient appears clinically stable though continues on non-rebreather mask. I discussed the case with pulmonology and notified them of the patient's so that she may be seen in consultl. Problem #2. End-stage renal failure. On hemodialysis Problem #3. Acute on chronic congestive heart failure managed with dialysis The patient appears clinically stable post transfer. All orders appear to have transferred here appropriately. I did notify pulmonology of patient arrival so they may see the patient in consult.

## 2015-08-31 NOTE — Progress Notes (Signed)
Pt transferred via inpatient bed to room 208 for dialysis. Pt transferred with 10L O2 via NRB with a spO2 of 92%. Dialysis nurse expecting patient. Report given to her.

## 2015-08-31 NOTE — Care Management Obs Status (Signed)
[facility] NOTIFICATION   Patient Details  Name: GABRELLE DORITY MRN: [id number] Date of Birth: [birth year]   Medicare Observation Status Notification Given:  Yes    Sherald Barge, RN 08/16/2015, 8:29 AM

## 2015-08-31 NOTE — Clinical Social Work Note (Signed)
CSW spoke with Alana at [facility] and Freda Munro at [facility] and provided status update.  Freda Munro advised that they could accept patient over the weekend, however patient's oxygen would need to be a level 7 or lower as it was currently a Level 10.   Masiah Lewing, Clydene Pugh, LCSW

## 2015-08-31 NOTE — Procedures (Signed)
   HEMODIALYSIS TREATMENT NOTE:  3.5 hour heparin-free dialysis completed via newly placed right IJ tunneled catheter.  Goal NOT met:  Pt unable to tolerate removal of 1 liter as ordered.  Ultrafiltration was interrupted or decreased to a minimal rate for 2 hours and 38 minutes of her HD session.  Net UF = 0.  All blood was returned.  Pt c/o indigestion, requested tums.  Report called to Rosealee Albee, RN.  Rockwell Alexandria, RN, CDN

## 2015-09-01 DIAGNOSIS — J9601 Acute respiratory failure with hypoxia: Secondary | ICD-10-CM

## 2015-09-01 DIAGNOSIS — J849 Interstitial pulmonary disease, unspecified: Secondary | ICD-10-CM

## 2015-09-01 DIAGNOSIS — Z992 Dependence on renal dialysis: Secondary | ICD-10-CM

## 2015-09-01 DIAGNOSIS — J9620 Acute and chronic respiratory failure, unspecified whether with hypoxia or hypercapnia: Secondary | ICD-10-CM

## 2015-09-01 DIAGNOSIS — R0902 Hypoxemia: Secondary | ICD-10-CM

## 2015-09-01 LAB — RENAL FUNCTION PANEL
ANION GAP: 10 (ref 5–15)
Albumin: 2.4 g/dL — ABNORMAL LOW (ref 3.5–5.0)
BUN: 40 mg/dL — ABNORMAL HIGH (ref 6–20)
CALCIUM: 8.4 mg/dL — AB (ref 8.9–10.3)
CO2: 28 mmol/L (ref 22–32)
CREATININE: 3.62 mg/dL — AB (ref 0.44–1.00)
Chloride: 98 mmol/L — ABNORMAL LOW (ref 101–111)
GFR calc Af Amer: 13 mL/min — ABNORMAL LOW (ref >60)
GFR calc non Af Amer: 11 mL/min — ABNORMAL LOW (ref >60)
GLUCOSE: 118 mg/dL — AB (ref 65–99)
Phosphorus: 5.9 mg/dL — ABNORMAL HIGH (ref 2.5–4.6)
Potassium: 4.8 mmol/L (ref 3.5–5.1)
SODIUM: 136 mmol/L (ref 135–145)

## 2015-09-01 MED ORDER — IPRATROPIUM-ALBUTEROL 0.5-2.5 (3) MG/3ML IN SOLN
3.0000 mL | Freq: Three times a day (TID) | RESPIRATORY_TRACT | Status: DC
  Administered 2015-09-01 – 2015-09-02 (×5): 3 mL via RESPIRATORY_TRACT
  Filled 2015-09-01 (×5): qty 3

## 2015-09-01 MED ORDER — METHYLPREDNISOLONE SODIUM SUCC 40 MG IJ SOLR
40.0000 mg | Freq: Four times a day (QID) | INTRAMUSCULAR | Status: DC
  Administered 2015-09-01 – 2015-09-02 (×4): 40 mg via INTRAVENOUS
  Filled 2015-09-01 (×4): qty 1

## 2015-09-01 NOTE — Progress Notes (Signed)
PROGRESS NOTE    [name]  [id number] DOB: 17-Aug-1934 DOA: 09/08/2015 PCP: Sherrie Mustache, MD Outpatient Specialists:    Brief Narrative:  8 yof was recently hospitalized at [facility] and discharged on 6/1 after being treated for shortness of breath and associated diffuse interstitial infiltrates of unclear cause. She presented back to the ED  with complaints of SOB and found to be hypoxic. She was re admitted for respiratory failure. Patient initially required BiPAP and was started on intravenous steroids with some improvement. She was able to wean down nasal cannula, but over the last few days has had a progressive decline. She is now back on high flow oxygen at 10L.Marland Kitchen Her chronic kidney disease progressed to end stage renal disease this hospitalization and hemodialysis was initiated. Patient has undergone 3x sessions of HD, but has been having difficulty tolerating entire sessions. It does not appear that she is volume overloaded at this time. Since she does not appear to be improving from a respiratory standpoint, it was felt that she needed a pulmonology evaluation and possibly further work up. Due to lack of pulmonology coverage at [facility], she was transferred to [facility] ([facility]) where she was seen by pulm and they signed off.  O2 sats were weaned from 10L at AP to 5L overnight with no further intervention.  Assessment & Plan:   Principal Problem:   Acute renal failure superimposed on stage 4 chronic kidney disease (HCC) Active Problems:   ILD (interstitial lung disease) ([place])   Interstitial lung disease (HCC)   ESRD on dialysis ([place])   Acute respiratory failure with hypoxia (HCC)   Protein-calorie malnutrition, severe  Acute on chronic hypoxic respiratory failure, multifactorial.   -Predominantly interstitial lung disease of unclear etiology.  -Pulmonary edema component appears resolved - recently started on home O2 (3L at rest, 4 with exertion). -Patient was seen by pulmonology  earlier this month, with uncertain differential including autoimmune disease, pulmonary fibrosis, BOOP, viral pneumonitis, and pulmonary edema Recent rhinovirus positive on last admission Bronchoscopy with BAL and lung biopsy are being considered as an outpatient -wean steroids per pulm  Acute renal failure which has progressed to end-stage renal disease.  -Hemodialysis initiated this hospitalization.  - difficulty tolerating entire sessions -Unable to be dialyzed from fistula--- had new HD catheter placed 6/16  Acute on chronic combined heart failure, appears compensated. LVEF 45-50%. Procardia was discontinued on previous discharge since it can contribute to V/Q mismatch. Management of volume status done with HD.  Coronary artery disease. Stable. Continue aspirin and statins.  Anemia of chronic kidney disease, stable.  DVT prophylaxis: SCDs Code Status: Full Family Communication: Discussed with patient and family at bedside.  Disposition Plan: [facility] Monday after dialysis   Consultants:  Nephrology   General surgery  Procedures:  Hemodialysis 6/11  Hemodialysis 6/13  Hemodialysis 6/16  Tunneled dialysis catheter insertion 6/16, Dr. Rosana Hoes  Antimicrobials:  None  Subjective: Resting-- did not get much sleep last PM   Objective: Filed Vitals:   09/01/15 1000 09/01/15 1122 09/01/15 1128 09/01/15 1200  BP: 117/37  128/67 122/69  Pulse: 78 73 71 71  Temp:   97.9 F (36.6 C)   TempSrc:   Oral   Resp: 27 33 31 31  Height:      Weight:      SpO2: 96% 100% 100% 96%    Intake/Output Summary (Last 24 hours) at 09/01/15 1424 Last data filed at 09/01/15 0900  Gross per 24 hour  Intake  112 ml  Output      0 ml  Net    112 ml   Filed Weights   08/26/2015 1209 09/01/2015 2330 09/01/15 0329  Weight: 58.3 kg (128 lb 8.5 oz) 55 kg (121 lb 4.1 oz) 56.3 kg (124 lb 1.9 oz)    Examination:  General exam: resting Respiratory system: no wheezing, appears  clear Cardiovascular system: S1 & S2 heard, RRR. No JVD, murmurs, rubs, gallops or clicks. No pedal edema. Gastrointestinal system: Abdomen is nondistended, soft and nontender. No organomegaly or masses felt. Normal bowel sounds heard. Central nervous system: Alert and oriented. No focal neurological deficits. Extremities: Symmetric 5 x 5 power. Skin: No rashes, lesions or ulcers Psychiatry: Judgement and insight appear normal. Mood & affect appropriate.    Data Reviewed: I have personally reviewed following labs and imaging studies  CBC:  Recent Labs Lab 08/26/15 0609 08/29/15 0546 08/18/2015 0540  WBC 9.4 7.5 11.3*  HGB 9.8* 10.6* 9.7*  HCT 29.7* 32.5* 29.4*  MCV 93.7 94.8 95.5  PLT 200 163 [id number]   Basic Metabolic Panel:  Recent Labs Lab 08/26/15 0609 08/29/15 0548 08/30/15 0537 09/10/2015 0548 09/01/15 0624  NA 134* 133* 133* 132* 136  K 4.9 4.2 4.9 4.8 4.8  CL 97* 98* 97* 97* 98*  CO2 24 26 24 25 28   GLUCOSE 118* 91 136* 105* 118*  BUN 94* 59* 81* 97* 40*  CREATININE 5.57* 4.52* 5.69* 6.08* 3.62*  CALCIUM 8.3* 8.2* 8.5* 8.4* 8.4*  PHOS 8.5* 6.2*  --   --  5.9*   GFR: Estimated Creatinine Clearance: 11.4 mL/min (by C-G formula based on Cr of 3.62). Liver Function Tests:  Recent Labs Lab 08/26/15 0609 08/29/15 0548 09/01/15 0624  ALBUMIN 2.6* 2.6* 2.4*   Urine analysis:    Component Value Date/Time   COLORURINE YELLOW 08/08/2015 0550   APPEARANCEUR CLOUDY* 08/08/2015 0550   LABSPEC 1.015 08/08/2015 0550   PHURINE 5.5 08/08/2015 0550   GLUCOSEU NEGATIVE 08/08/2015 0550   HGBUR TRACE* 08/08/2015 0550   BILIRUBINUR NEGATIVE 08/08/2015 0550   KETONESUR NEGATIVE 08/08/2015 0550   PROTEINUR 30* 08/08/2015 0550   UROBILINOGEN 0.2 12/21/2012 1300   NITRITE NEGATIVE 08/08/2015 0550   LEUKOCYTESUR NEGATIVE 08/08/2015 0550     ) Recent Results (from the past 240 hour(s))  Surgical pcr screen     Status: None   Collection Time: 09/01/2015  8:04 AM  Result  Value Ref Range Status   MRSA, PCR NEGATIVE NEGATIVE Final   Staphylococcus aureus NEGATIVE NEGATIVE Final    Comment:        The Xpert SA Assay (FDA approved for NASAL specimens in patients over 14 years of age), is one component of a comprehensive surveillance program.  Test performance has been validated by [facility] for patients greater than or equal to 55 year old. It is not intended to diagnose infection nor to guide or monitor treatment.     Radiology Studies: [place] Intraoperative  08/27/2015  CLINICAL DATA:  Ultrasound was provided for use by the ordering physician, and a technical charge was applied by the performing facility.  No radiologist interpretation/professional services rendered.   Dg Chest Portable 1 View  09/06/2015  CLINICAL DATA:  Hemodialysis catheter insertion EXAM: PORTABLE CHEST 1 VIEW COMPARISON:  August 27, 2015 FINDINGS: Central catheter tip is at the cavoatrial junction. No pneumothorax. There is diffuse interstitial and alveolar edema. There is cardiomegaly with pulmonary venous hypertension. No adenopathy appreciable. IMPRESSION: Central catheter tip at  cavoatrial junction. No pneumothorax. Findings consistent with diffuse congestive heart failure. There may be a degree of superimposed ARDS. Electronically Signed   By: Lowella Grip III M.D.   On: 09/08/2015 11:34   Dg C-arm 1-60 Min  09/09/2015  CLINICAL DATA:  ESRD requiring hemodialysis. Tunneled hemodialysis catheter placement. EXAM: DG C-ARM 61-120 MIN COMPARISON:  None. FINDINGS: Fluoroscopy time 8 seconds. Single nondiagnostic spot fluoroscopic intraoperative radiograph demonstrates the tip of a central venous catheter to the right of the thoracic spine. IMPRESSION: Intraoperative fluoroscopic guidance for tunneled hemodialysis catheter placement. Electronically Signed   By: Ilona Sorrel M.D.   On: 09/04/2015 11:22   Scheduled Meds: . arformoterol  15 mcg Nebulization BID  . aspirin EC  81 mg Oral  Daily  . budesonide  0.5 mg Nebulization BID  . calcitRIOL  0.25 mcg Oral Daily  . calcium acetate  2,001 mg Oral TID WC  . citalopram  20 mg Oral Daily  . [START ON 09/22/2015] cyanocobalamin  1,000 mcg Intramuscular Q30 days  . epoetin (EPOGEN/PROCRIT) injection  6,000 Units Intravenous Q T,Th,Sa-HD  . famotidine  20 mg Oral Daily  . feeding supplement  1 Container Oral BID BM  . fluticasone  2 spray Each Nare Daily  . guaiFENesin  1,200 mg Oral BID  . heparin  5,000 Units Subcutaneous Q8H  . ipratropium-albuterol  3 mL Nebulization TID  . levothyroxine  50 mcg Oral QAC breakfast  . methylPREDNISolone (SOLU-MEDROL) injection  40 mg Intravenous Q6H  . pantoprazole  40 mg Oral Daily  . pravastatin  80 mg Oral QHS  . senna-docusate  1 tablet Oral Daily   Continuous Infusions:     LOS: 12 days   Time spent: 35 minutes  Eulogio Bear [facility] Pager [phone]  If 7PM-7AM, please contact night-coverage [email or web address] Password [facility] 09/01/2015, 2:24 PM

## 2015-09-01 NOTE — Consult Note (Signed)
PULMONARY / CRITICAL CARE MEDICINE   Name: [name] MRN: [id number] DOB: 1934/03/23    ADMISSION DATE:  09/11/2015 CONSULTATION DATE:  09/01/15  REFERRING MD:  [facility]  CHIEF COMPLAINT:  Shortness of breath  HISTORY OF PRESENT ILLNESS:   [name] is a 68F with PMH significant for ESRD on HD, coronary disease, prior abdominal aortic stent graft, HTN, HLD, hypothyroidism who presents as a transfer from [facility] after a 2-week hospitalization for hypoxemic respiratory failure of unclear etiology. During this most recent hospitalization she required placement of a new dialysis access (R IJ permcath). She was initially thought to be hypoxemic secondary to volume overload and systolic heart failure (EF 45-50%), but she has failed to improved with volume removal. She was hospitalized at [facility] (from 08/01/15-08/16/15). During that admission she had an extensive workup including CT chest, V/Q scan and TTE. She was started on solumedrol that admission to see if her ground glass opacities might be steroid responsive and discharged on a long taper. She reports minimal if any improvement with steroids. She had a thorough autoimmune workup as well, which was notable only for a RF of 21. CCP was negative.  Currently she endorses shortness of breath with minimal exertion. She denies cough / sputum / hemoptysis / fever / chills / nausea / vomiting / constipation / diarrhea / palpitations / chest pain.   SUBJECTIVE:  No events overnight, feels about the same  VITAL SIGNS: BP 128/67 mmHg  Pulse 71  Temp(Src) 97.9 F (36.6 C) (Oral)  Resp 31  Ht _0  (1.651 m)  Wt 56.3 kg (124 lb 1.9 oz)  BMI 20.65 kg/m2  SpO2 100%  HEMODYNAMICS:    VENTILATOR SETTINGS:    INTAKE / OUTPUT: I/O last 3 completed shifts: In: 300 [I.V.:300] Out: 20 [Blood:20]  PHYSICAL EXAMINATION:  General Well nourished, well developed, no apparent distress  HEENT No gross abnormalities. Oropharynx clear. Mallampati II.  Good dentition.   Pulmonary Clear to auscultation bilateral apices on anterior exam. Bibasilar crackles.  Cardiovascular Normal rate, regular rhythm. S1, s2. No m/r/g. Distal pulses palpable.  Abdomen Soft, non-tender, non-distended, positive bowel sounds, no palpable organomegaly or masses. Normoresonant to percussion.  Musculoskeletal Moves all extremities, no gross abnormalities.   Lymphatics No cervical, supraclavicular or axillary adenopathy.   Neurologic Grossly intact. No focal deficits.   Skin/Integuement No rash, no cyanosis, no clubbing.      LABS:  BMET  Recent Labs Lab 08/30/15 0537 09/04/2015 0548 09/01/15 0624  NA 133* 132* 136  K 4.9 4.8 4.8  CL 97* 97* 98*  CO2 _1 BUN 81* 97* 40*  CREATININE 5.69* 6.08* 3.62*  GLUCOSE 136* 105* 118*    Electrolytes  Recent Labs Lab 08/26/15 0609 08/29/15 0548 08/30/15 0537 09/02/2015 0548 09/01/15 0624  CALCIUM 8.3* 8.2* 8.5* 8.4* 8.4*  PHOS 8.5* 6.2*  --   --  5.9*    CBC  Recent Labs Lab 08/26/15 0609 08/29/15 0546 09/02/2015 0540  WBC 9.4 7.5 11.3*  HGB 9.8* 10.6* 9.7*  HCT 29.7* 32.5* 29.4*  PLT 200 163 201    Coag's No results for input(s): APTT, INR in the last 168 hours.  Sepsis Markers No results for input(s): LATICACIDVEN, PROCALCITON, O2SATVEN in the last 168 hours.  ABG  Recent Labs Lab 09/11/2015 1753  PHART 7.507*  PCO2ART 36.9  PO2ART 58.5*    Liver Enzymes  Recent Labs Lab 08/26/15 0609 08/29/15 0548 09/01/15 0624  ALBUMIN 2.6*  2.6* 2.4*    Cardiac Enzymes No results for input(s): TROPONINI, PROBNP in the last 168 hours.  Glucose No results for input(s): GLUCAP in the last 168 hours.  Imaging I reviewed CXR myself, pulmonary edema noted.  STUDIES:  5/23 V/Q > low prob PE Sep 06, 2022 RHC > mild PAH (likely group 3), with PRV 2.9 normal PCWP and CO, resting hypoxemia. No role for pulmonary vasodilators.  September 06, 2022 CT Chest > Markedly abnormal pulmonary parenchyma which  geographic areas of septal thickening and ground-glass opacity. Small amount of left pleural fluid with dependent atelectasis. Small to moderate pericardial effusion. Atherosclerosis of the aorta and the coronary arteries. 1 cm right paratracheal lymph node just beneath the thoracic inlet. There is not an extensive pattern of lymphadenopathy.  5/25  ESR >> 122 CRP >> 34.1  RF >> 21.3 elevated ANA >> no results seen ANCA >> (-) ACE >> 22 Eosinophils >> 0.3  5/31 Jo1 >> <0.2 ANA >> (-) SCL70 >> <0.2 Sm >> <0.2 HIV >> (-) CCP >> (4)  CULTURES: None  ANTIBIOTICS: None  SIGNIFICANT EVENTS:   LINES/TUBES: PIV  DISCUSSION: [name] is a 46F with hypoxemic respiratory failure that has been progressive over the last month or so. Initial workup was notable for rhino/entero virus and resultant ARDS (now fibrotic stage) is certainly a possibility. Her CXR has shown marked progression with increasing involvement of the bases. It is difficult to know if there is a component of volume overload or this is merely progression of her underlying pathology. Interestingly, her initial CT showed more apical disease. She clearly does not fit a UIP pattern. It does not appear that she is steroid responsive.   ASSESSMENT / PLAN:  PULMONARY A: Acute hypoxemic respiratory failure, likely multifactorial and related to underlying ILD vs volume overload vs sequelae of ARDS P:   Continue supplemental oxygen - wean as tolerated for sats >90% (previously required 3L at rest, 4L with exertion) Continue aggressive management of volume status Consider repeat CT imaging to assess progression of disease but will defer that as outpatient for now. Needs volume negative via dialysis. If able to wean O2, could consider bronchoscopy with BAL for diagnostic purposes but again will wait til outpatient, please arrange follow up with PCCM as outpatient upon discharge. Doubt utility of continued high dose steroids  -> taper to off Continue nebulized treatments if she derives clinical benefit  CARDIOVASCULAR A:  Hx CAD Hx abdominal aortic aneurysm repair HTN HLD P:  Continue home meds  RENAL A:   ESRD on HD P:   Dialysis and volume removal as tolerated  Discussed with RT, PCCM will sign off, please call back if needed.  Patient advised that drinking a lot of water will result in pulmonary edema if not dialyzed and that daily weights are important.  She evidently has not associated excessive drinking of water with her current kidney condition as an issue.  Rush Farmer, M.D. [facility] Pulmonary/Critical Care Medicine. Pager: [phone]. After hours pager: [phone].  09/01/2015, 11:55 AM

## 2015-09-01 NOTE — Progress Notes (Signed)
Desats. to 80's on  5L [place] , placed on high flow at 10 L [place]  Sat at 99%, cont. to monitor.

## 2015-09-01 NOTE — Consult Note (Signed)
Renal Service Consult Note [facility]  [name] 09/01/2015 [name] Requesting Physician:  Krista [name]  Reason for Consult:  ESRD pt wtih HPI: The patient is a 81 y.o. year-old with hx of CAD/ stent, AAA stent-graft repair, HTN, tobacco use and CKD stage 5.    Patient was admitted here in May with dyspnea/ hypoxemia and abnormal CT chest w geographic areas of pulm infiltrates , unclear cause possible ARDS/ IS lung dx/ sarcoid, etc, seen by pulm w broad ddx including pulm fibrosis/ autoimmune/ viral dz.  Noted that she didn't improve w abx or diuresis. Viral panel showed only norovirus.  ANA, HIV, ACE and ANCA were negative.  Antibodies (anti-Jo, scleroderma, Smith) and CCP Ab's were negative. RF and CRP were elevated.  Rec's were supp O2, pulmicort/ Brovana, PPI/ consider swallow eval, and continue steroids, changed to po 40 pred at dc.  Plan was for OP bronch w BAL and Bx. DC'd on 6/1.   Readmitted to [facility] 4 days later on 6/5.  Still SOB and hypoxemic.  Given IV lasix but didn't improve so was started on dialysis.  Several attempts to remove fluid were unsuccessful due to hypotension.  Eventually pt was transferred to [facility] for persistent hypoxemia/ SOB / pulm infiltrates for further pulm evaluation.  Here last HD was yesterday.    Patient reports SOB/ DOE, was able to get up to chair last week, "not sure" today.  No prod cough or hemoptysis, no ankle swelling or other swelling.  No abd pain, n/v/d.  NO fevers or chills. Creat today 3.62, K 4.8 Na 136.  CXR shows patchy infiltrates c/w ARDS vs CHF from 6/16.        Chart review: Feb 08 - NSTEMI, HTN, HL, ICM EF 40%, tobacco > had cath w PCI to RCA lesion Jul 08 - CKD w atrophic L renal and severe R renal art stenosis > had RRA PTA/ stenting Oct 14 - CKD 2.5, known AAA now 5.2cm > had EVAR stent-graft to AAA, recovered well May 17 - SOB unclear cause, diffuse pulm infiltrates ?sarcoid/ PNA / malig rx with empiric steroids  per pulm, 8d abx Jun 5- 16, 2017 > admit [facility] , SOB/ hypoxemia, rx IV lasix, CKD5 f/b [name] , then started on dialysis via AVF.  AVF malfunctioned.  LV 50%.  Pulm following for IS lung disease. Unable to pull volume due to hypotension during HD. Resp status not improving, pt transferred to [facility] on 6/16 for pulm evaluation. Last HD 6/16 unable to remove 1 L goal due to hypotension.  Net UF = 0.   ROS  denies CP  no joint pain   no HA  no blurry vision  no rash  no diarrhea  no nausea/ vomiting  no dysuria  no difficulty voiding  no change in urine color    Past Medical History  Past Medical History  Diagnosis Date  . CAD (coronary artery disease)     50% proximal LAD stenosis, 50% marginal stenosis of the circumflex, RCA 95% stenosis, EF 40% stenosis with hypokinesis of the interior wall.  She had a Taxus stent placed in 04/16/2006  . Hypertension   . Abdominal aortic aneurysm ([place])   . Renal artery stenosis ([facility])     S/P stenting of the right renal atrophic left renal  . Dyslipidemia   . Hypothyroidism   . Tobacco user     Previous  . Adenomatous polyp 2004, 2011  . Hyperplastic colon  polyp 2011  . Hemorrhoids   . Diverticulosis   . Renal insufficiency   . Anemia   . Myocardial infarction ([place])     stent 2008  . Constipation   . Cataract   . AAA (abdominal aortic aneurysm) [facility])    Past Surgical History  Past Surgical History  Procedure Laterality Date  . Abdominal hysterectomy    . Carpal tunnel release Bilateral   . Cholecystectomy    . Flexible sigmoidoscopy  02/25/2012    Procedure: FLEXIBLE SIGMOIDOSCOPY;  Surgeon: Krista Castle, MD;  Location: [facility] ENDOSCOPY;  Service: Endoscopy;  Laterality: N/A;  . Hemorrhoid banding  02/25/2012    Procedure: HEMORRHOID BANDING;  Surgeon: Krista Castle, MD;  Location: [facility] ENDOSCOPY;  Service: Endoscopy;  Laterality: N/A;  . Abdominal angiogram  12/21/2012    AAA  . Abdominal aortic endovascular stent graft N/A 12/22/2012     Procedure: ABDOMINAL AORTIC ENDOVASCULAR STENT GRAFT- GORE;  Surgeon: Krista Misty, MD;  Location: [place];  Service: Vascular;  Laterality: N/A;  Ultrasound guided  . Abdominal angiogram N/A 11/30/2012    Procedure: ABDOMINAL ANGIOGRAM;  Surgeon: Krista Mitchell, MD;  Location: [facility] CATH LAB;  Service: Cardiovascular;  Laterality: N/A;  . Cataract extraction, bilateral    . Colonoscopy    . Polypectomy    . Cardiac catheterization  2008  . Av fistula placement Left 04/20/2015    Procedure: CREATION OF LEFT UPPER ARM BRACHIOCEPHALIC ARTERIOVENOUS (AV) FISTULA  ;  Surgeon: Krista Misty, MD;  Location: [place];  Service: Vascular;  Laterality: Left;  . Cardiac catheterization N/A 08/08/2015    Procedure: Right Heart Cath;  Surgeon: Krista Artist, MD;  Location: [place] CV LAB;  Service: Cardiovascular;  Laterality: N/A;   Family History  Family History  Problem Relation Age of Onset  . Uterine cancer Mother   . Heart attack Father 50  . Heart disease Father     before age 64  . Peripheral vascular disease Sister   . Other Sister     Renal artery stenosis  . Heart disease Sister     before age 83  . Heart attack Brother 24  . Heart disease Brother     before age 22  . Lung cancer Brother     \  . Colon cancer Neg Hx   . Breast cancer Maternal Aunt     x2   Social History  reports that she quit smoking about 9 years ago. Her smoking use included Cigarettes. She quit after 1 year of use. She has never used smokeless tobacco. She reports that she does not drink alcohol or use illicit drugs. Allergies  Allergies  Allergen Reactions  . Codeine Rash  . Sulfonamide Derivatives Hives   Home medications Prior to Admission medications   Medication Sig Start Date End Date Taking? Authorizing Provider  arformoterol (BROVANA) 15 MCG/2ML NEBU Take 2 mLs (15 mcg total) by nebulization 2 (two) times daily. 08/16/15  Yes Krista Blaze, MD  aspirin EC 81 MG tablet Take 81 mg by mouth  daily.   Yes Historical Provider, MD  bisacodyl (BISACODYL) 5 MG EC tablet Take 5 mg by mouth daily as needed for moderate constipation.   Yes Historical Provider, MD  budesonide (PULMICORT) 0.5 MG/2ML nebulizer solution Take 2 mLs (0.5 mg total) by nebulization 2 (two) times daily. 08/16/15  Yes Krista Blaze, MD  calcitRIOL (ROCALTROL) 0.25 MCG capsule Take 1 capsule (0.25 mcg total)  by mouth daily. 08/16/15  Yes Krista Blaze, MD  citalopram (CELEXA) 20 MG tablet Take 20 mg by mouth daily.  12/16/10  Yes Historical Provider, MD  cyanocobalamin (,VITAMIN B-12,) 1000 MCG/ML injection Inject 1,000 mcg into the muscle every 30 (thirty) days.    Yes Historical Provider, MD  diphenhydramine-acetaminophen (ACETAMINOPHEN PM) 25-500 MG TABS tablet Take 1 tablet by mouth at bedtime as needed.   Yes Historical Provider, MD  furosemide (LASIX) 80 MG tablet Take 1 tablet (80 mg total) by mouth daily. *MUST MAKE APPOINTMENT* 08/16/15  Yes Krista Blaze, MD  ipratropium-albuterol (DUONEB) 0.5-2.5 (3) MG/3ML SOLN Take 3 mLs by nebulization 4 (four) times daily - after meals and at bedtime. 08/16/15  Yes Krista Blaze, MD  levothyroxine (SYNTHROID, LEVOTHROID) 50 MCG tablet Take 50 mcg by mouth daily.    Yes Historical Provider, MD  meclizine (ANTIVERT) 25 MG tablet Take 1 tablet (25 mg total) by mouth 3 (three) times daily as needed for dizziness. 08/14/14  Yes Veryl Speak, MD  NIFEdipine (PROCARDIA XL/ADALAT-CC) 60 MG 24 hr tablet TAKE 1 TABLET (60 MG TOTAL) BY MOUTH DAILY. PATIENT NEEDS TO CONTACT OFFICE FOR ADDITIONAL REFILLS 07/12/15  Yes Minus Breeding, MD  nitroGLYCERIN (NITROSTAT) 0.4 MG SL tablet Place 0.4 mg under the tongue every 5 (five) minutes as needed for chest pain.   Yes Historical Provider, MD  omeprazole (PRILOSEC) 20 MG capsule Take 20 mg by mouth daily as needed (For heartburn or acid reflux.).  07/13/13  Yes Historical Provider, MD  pravastatin (PRAVACHOL) 80 MG tablet Take 80 mg by mouth at bedtime.   03/29/12  Yes Historical Provider, MD  predniSONE (DELTASONE) 20 MG tablet Take 40 mg tablet for 5 days, after that continue with 30 mg tablet once daily for 5 days, followed by 20 mg tablet daily for 5 days, 10 mg tablet for 5 days and ensure follow up with pulmonologist 08/16/15  Yes Krista Blaze, MD  senna-docusate (SENOKOT-S) 8.6-50 MG tablet Take 1 tablet by mouth daily.   Yes Historical Provider, MD  triamcinolone (NASACORT ALLERGY 24HR) 55 MCG/ACT AERO nasal inhaler Place 2 sprays into the nose daily.   Yes Historical Provider, MD  HYDROcodone-acetaminophen (NORCO/VICODIN) 5-325 MG tablet Take 1 tablet by mouth every 6 (six) hours as needed for moderate pain. Patient not taking: Reported on 08/30/2015 08/14/15   Krista Blaze, MD  Linaclotide [facility]) 145 MCG CAPS capsule Take 1 capsule (145 mcg total) by mouth daily. Patient not taking: Reported on 08/23/2015 06/07/15   Milus Banister, MD  traMADol (ULTRAM) 50 MG tablet Take 1 tablet (50 mg total) by mouth every 6 (six) hours as needed. Patient not taking: Reported on 09/10/2015 04/20/15   Alvia Grove, PA-C  traZODone (DESYREL) 50 MG tablet Take 0.5 tablets (25 mg total) by mouth at bedtime as needed for sleep. Patient not taking: Reported on 09/11/2015 08/14/15   Krista Blaze, MD   Liver Function Tests  Recent Labs Lab 08/26/15 [phone] 08/29/15 0548 09/01/15 0624  ALBUMIN 2.6* 2.6* 2.4*   No results for input(s): LIPASE, AMYLASE in the last 168 hours. CBC  Recent Labs Lab 08/26/15 0609 08/29/15 0546 09/12/2015 0540  WBC 9.4 7.5 11.3*  HGB 9.8* 10.6* 9.7*  HCT 29.7* 32.5* 29.4*  MCV 93.7 94.8 95.5  PLT 200 163 [id number]   Basic Metabolic Panel  Recent Labs Lab 08/26/15 0609 08/29/15 0548 08/30/15 0537 08/25/2015 0548 09/01/15 0624  NA 134* 133* 133*  132* 136  K 4.9 4.2 4.9 4.8 4.8  CL 97* 98* 97* 97* 98*  CO2 24 26 24 25 28   GLUCOSE 118* 91 136* 105* 118*  BUN 94* 59* 81* 97* 40*  CREATININE 5.57* 4.52* 5.69* 6.08* 3.62*   CALCIUM 8.3* 8.2* 8.5* 8.4* 8.4*  PHOS 8.5* 6.2*  --   --  5.9*   Iron/TIBC/Ferritin/ %Sat    Component Value Date/Time   IRON 40 08/22/2015 0437   TIBC 190* 08/22/2015 0437   FERRITIN 646* 08/22/2015 0437   IRONPCTSAT 21 08/22/2015 0437    Filed Vitals:   09/01/15 0600 09/01/15 0700 09/01/15 0736 09/01/15 0835  BP: 133/69  131/63   Pulse: 63 62 69   Temp:   97.5 F (36.4 C)   TempSrc:   Oral   Resp: 18 24 21    Height:      Weight:      SpO2: 100% 99% 100% 100%   Exam Gen awake, Joshua O2, not in distress, calm, hesitant No rash, cyanosis or gangrene Sclera anicteric, throat clear  No jvd or bruits Chest bilat scattered rales, irregular RRR no MRG Abd soft ntnd no mass or ascites +bs GU defer MS no joint effusions or deformity Ext no LE edema / no wounds or ulcers Neuro is alert, Ox 3 , nf   CT chest on 08/08/15 > Throughout both lungs, there are geographic areas of septal thickening and ground-glass opacity in a patchy distribution. Some lobules are affected an others are not. Most common cause of this pattern is alveolar proteinosis. Other causes can include pneumocystis pneumonia, bronchoalveolar carcinoma, sarcoid, nonspecific interstitial pneumonia and cryptogenic organizing pneumonia. Adult respiratory distress syndrome and pulmonary hemorrhage can also cause this. CXR 6/16 > poor inspiration, bilat diffuse edema vs infiltrates, R > L    Assessment: 1  Hypoxemia / diffuse pulm infiltrates - unclear cause, but failed attempts to clear w HD and abx.  W/U per pulm in progress. There is NO extra volume on board clinically and all recent attempts to remove even 1 liter of fluid this week at [facility] were unsuccessful.   2  CKD 5, new ESRD - started HD this admission.  AVF infiltrated and resting, R IJ cath in place 3  HTN - no bp meds at this time, normal BP 4  Volume is euvolemic on exam 5  Anemia Hb 9.7, follow, no esa for now 6  CAD/hx of stent 7  AAA sp stent-graft  repair   Plan - next HD Monday.    Kelly Splinter MD [employer] pager [phone]    cell [phone] 09/01/2015, 8:48 AM

## 2015-09-01 NOTE — Consult Note (Signed)
PULMONARY / CRITICAL CARE MEDICINE   Name: [name] MRN: [id number] DOB: May 12, 1934    ADMISSION DATE:  08/21/2015 CONSULTATION DATE:  09/01/15  REFERRING MD:  [facility]  CHIEF COMPLAINT:  Shortness of breath  HISTORY OF PRESENT ILLNESS:   [name] is a 54F with PMH significant for ESRD on HD, coronary disease, prior abdominal aortic stent graft, HTN, HLD, hypothyroidism who presents as a transfer from [facility] after a 2-week hospitalization for hypoxemic respiratory failure of unclear etiology. During this most recent hospitalization she required placement of a new dialysis access (R IJ permcath). She was initially thought to be hypoxemic secondary to volume overload and systolic heart failure (EF 45-50%), but she has failed to improved with volume removal. She was hospitalized at [facility] (from 08/01/15-08/16/15). During that admission she had an extensive workup including CT chest, V/Q scan and TTE. She was started on solumedrol that admission to see if her ground glass opacities might be steroid responsive and discharged on a long taper. She reports minimal if any improvement with steroids. She had a thorough autoimmune workup as well, which was notable only for a RF of 21. CCP was negative.  Currently she endorses shortness of breath with minimal exertion. She denies cough / sputum / hemoptysis / fever / chills / nausea / vomiting / constipation / diarrhea / palpitations / chest pain.   PAST MEDICAL HISTORY :  She  has a past medical history of CAD (coronary artery disease); Hypertension; Abdominal aortic aneurysm ([place]); Renal artery stenosis ([place]); Dyslipidemia; Hypothyroidism; Tobacco user; Adenomatous polyp (2004, 2011); Hyperplastic colon polyp (2011); Hemorrhoids; Diverticulosis; Renal insufficiency; Anemia; Myocardial infarction ([place]); Constipation; Cataract; and AAA (abdominal aortic aneurysm) ([place]).  PAST SURGICAL HISTORY: She  has past surgical history that includes Abdominal  hysterectomy; Carpal tunnel release (Bilateral); Cholecystectomy; Flexible sigmoidoscopy (02/25/2012); Hemorrhoid banding (02/25/2012); Abdominal angiogram (12/21/2012); Abdominal aortic endovascular stent graft (N/A, 12/22/2012); abdominal angiogram (N/A, 11/30/2012); Cataract extraction, bilateral; Colonoscopy; Polypectomy; Cardiac catheterization (2008); AV fistula placement (Left, 04/20/2015); and Cardiac catheterization (N/A, 08/08/2015).  Allergies  Allergen Reactions  . Codeine Rash  . Sulfonamide Derivatives Hives    No current facility-administered medications on file prior to encounter.   Current Outpatient Prescriptions on File Prior to Encounter  Medication Sig  . arformoterol (BROVANA) 15 MCG/2ML NEBU Take 2 mLs (15 mcg total) by nebulization 2 (two) times daily.  Marland Kitchen aspirin EC 81 MG tablet Take 81 mg by mouth daily.  . budesonide (PULMICORT) 0.5 MG/2ML nebulizer solution Take 2 mLs (0.5 mg total) by nebulization 2 (two) times daily.  . calcitRIOL (ROCALTROL) 0.25 MCG capsule Take 1 capsule (0.25 mcg total) by mouth daily.  . citalopram (CELEXA) 20 MG tablet Take 20 mg by mouth daily.   . cyanocobalamin (,VITAMIN B-12,) 1000 MCG/ML injection Inject 1,000 mcg into the muscle every 30 (thirty) days.   . furosemide (LASIX) 80 MG tablet Take 1 tablet (80 mg total) by mouth daily. *MUST MAKE APPOINTMENT*  . ipratropium-albuterol (DUONEB) 0.5-2.5 (3) MG/3ML SOLN Take 3 mLs by nebulization 4 (four) times daily - after meals and at bedtime.  Marland Kitchen levothyroxine (SYNTHROID, LEVOTHROID) 50 MCG tablet Take 50 mcg by mouth daily.   . meclizine (ANTIVERT) 25 MG tablet Take 1 tablet (25 mg total) by mouth 3 (three) times daily as needed for dizziness.  Marland Kitchen NIFEdipine (PROCARDIA XL/ADALAT-CC) 60 MG 24 hr tablet TAKE 1 TABLET (60 MG TOTAL) BY MOUTH DAILY. PATIENT NEEDS TO CONTACT OFFICE FOR ADDITIONAL REFILLS  .  nitroGLYCERIN (NITROSTAT) 0.4 MG SL tablet Place 0.4 mg under the tongue every 5 (five) minutes  as needed for chest pain.  Marland Kitchen omeprazole (PRILOSEC) 20 MG capsule Take 20 mg by mouth daily as needed (For heartburn or acid reflux.).   Marland Kitchen pravastatin (PRAVACHOL) 80 MG tablet Take 80 mg by mouth at bedtime.   . predniSONE (DELTASONE) 20 MG tablet Take 40 mg tablet for 5 days, after that continue with 30 mg tablet once daily for 5 days, followed by 20 mg tablet daily for 5 days, 10 mg tablet for 5 days and ensure follow up with pulmonologist  . HYDROcodone-acetaminophen (NORCO/VICODIN) 5-325 MG tablet Take 1 tablet by mouth every 6 (six) hours as needed for moderate pain. (Patient not taking: Reported on 09/07/2015)  . Linaclotide (LINZESS) 145 MCG CAPS capsule Take 1 capsule (145 mcg total) by mouth daily. (Patient not taking: Reported on 09/07/2015)  . traMADol (ULTRAM) 50 MG tablet Take 1 tablet (50 mg total) by mouth every 6 (six) hours as needed. (Patient not taking: Reported on 09/04/2015)  . traZODone (DESYREL) 50 MG tablet Take 0.5 tablets (25 mg total) by mouth at bedtime as needed for sleep. (Patient not taking: Reported on 08/19/2015)    FAMILY HISTORY:  Her indicated that her mother is deceased. She indicated that her father is deceased. She indicated that her brother is deceased. She indicated that both of her maternal aunts are deceased.   SOCIAL HISTORY: She  reports that she quit smoking about 9 years ago. Her smoking use included Cigarettes. She quit after 1 year of use. She has never used smokeless tobacco. She reports that she does not drink alcohol or use illicit drugs.  REVIEW OF SYSTEMS:   General: no weight change, no fever, no chills Cardiovascular: no chest pain, palpitations, orthopnea, or PND Respiratory: see HPI Gastrointestinal: no nausea, vomiting, diarrhea, or constipation Genitourinary: no pain with urination (makes very little urine), no foul odor, no frequency, no urgency Musculoskeletal: no joint pain or swelling, no muscle aches Skin: no rashes, sores, or  ulcers Endocrine: no blood sugar problems, no thyroid problems Neurologic: no numbness, tingling, dizziness, or visual changes Hematologic: no bleeding, bruising, or clotting problems Psychiatric: no depression or anxiety, no suicidal ideation or intent   SUBJECTIVE:    VITAL SIGNS: BP 107/60 mmHg  Pulse 67  Temp(Src) 98.3 F (36.8 C) (Oral)  Resp 22  Ht 5' 5"  (1.651 m)  Wt 55 kg (121 lb 4.1 oz)  BMI 20.18 kg/m2  SpO2 100%  HEMODYNAMICS:    VENTILATOR SETTINGS:    INTAKE / OUTPUT: I/O last 3 completed shifts: In: 540 [P.O.:240; I.V.:300] Out: 20 [Blood:20]  PHYSICAL EXAMINATION:  General Well nourished, well developed, no apparent distress  HEENT No gross abnormalities. Oropharynx clear. Mallampati II. Good dentition.   Pulmonary Clear to auscultation bilateral apices on anterior exam. Wet rales bilaterally over lower fields. Good effort, symmetrical expansion. Exam limited by patient positioning.   Cardiovascular Normal rate, regular rhythm. S1, s2. No m/r/g. Distal pulses palpable.  Abdomen Soft, non-tender, non-distended, positive bowel sounds, no palpable organomegaly or masses. Normoresonant to percussion.  Musculoskeletal Moves all extremities, no gross abnormalities.   Lymphatics No cervical, supraclavicular or axillary adenopathy.   Neurologic Grossly intact. No focal deficits.   Skin/Integuement No rash, no cyanosis, no clubbing.      LABS:  BMET  Recent Labs Lab 08/29/15 0548 08/30/15 0537 08/28/2015 0548  NA 133* 133* 132*  K 4.2 4.9 4.8  CL 98* 97* 97*  CO2 26 24 25   BUN 59* 81* 97*  CREATININE 4.52* 5.69* 6.08*  GLUCOSE 91 136* 105*    Electrolytes  Recent Labs Lab 08/25/15 0547 08/26/15 0609 08/29/15 0548 08/30/15 0537 09/08/2015 0548  CALCIUM 7.7* 8.3* 8.2* 8.5* 8.4*  PHOS 7.4* 8.5* 6.2*  --   --     CBC  Recent Labs Lab 08/26/15 0609 08/29/15 0546 08/28/2015 0540  WBC 9.4 7.5 11.3*  HGB 9.8* 10.6* 9.7*  HCT 29.7* 32.5*  29.4*  PLT 200 163 201    Coag's No results for input(s): APTT, INR in the last 168 hours.  Sepsis Markers No results for input(s): LATICACIDVEN, PROCALCITON, O2SATVEN in the last 168 hours.  ABG  Recent Labs Lab 09/04/2015 1753  PHART 7.507*  PCO2ART 36.9  PO2ART 58.5*    Liver Enzymes  Recent Labs Lab 08/25/15 0547 08/26/15 0609 08/29/15 0548  ALBUMIN 2.6* 2.6* 2.6*    Cardiac Enzymes No results for input(s): TROPONINI, PROBNP in the last 168 hours.  Glucose No results for input(s): GLUCAP in the last 168 hours.  Imaging [place] Intraoperative  09/11/2015  CLINICAL DATA:  Ultrasound was provided for use by the ordering physician, and a technical charge was applied by the performing facility.  No radiologist interpretation/professional services rendered.   Dg Chest Portable 1 View  09/10/2015  CLINICAL DATA:  Hemodialysis catheter insertion EXAM: PORTABLE CHEST 1 VIEW COMPARISON:  August 27, 2015 FINDINGS: Central catheter tip is at the cavoatrial junction. No pneumothorax. There is diffuse interstitial and alveolar edema. There is cardiomegaly with pulmonary venous hypertension. No adenopathy appreciable. IMPRESSION: Central catheter tip at cavoatrial junction. No pneumothorax. Findings consistent with diffuse congestive heart failure. There may be a degree of superimposed ARDS. Electronically Signed   By: Lowella Grip III M.D.   On: 08/18/2015 11:34   Dg C-arm 1-60 Min  08/17/2015  CLINICAL DATA:  ESRD requiring hemodialysis. Tunneled hemodialysis catheter placement. EXAM: DG C-ARM 61-120 MIN COMPARISON:  None. FINDINGS: Fluoroscopy time 8 seconds. Single nondiagnostic spot fluoroscopic intraoperative radiograph demonstrates the tip of a central venous catheter to the right of the thoracic spine. IMPRESSION: Intraoperative fluoroscopic guidance for tunneled hemodialysis catheter placement. Electronically Signed   By: Ilona Sorrel M.D.   On: 09/10/2015 11:22     STUDIES:   5/23 V/Q > low prob PE 5/24 RHC > mild PAH (likely group 3), with PRV 2.9 normal PCWP and CO, resting hypoxemia. No role for pulmonary vasodilators.  5/24 CT Chest > Markedly abnormal pulmonary parenchyma which geographic areas of septal thickening and ground-glass opacity. Small amount of left pleural fluid with dependent atelectasis. Small to moderate pericardial effusion. Atherosclerosis of the aorta and the coronary arteries. 1 cm right paratracheal lymph node just beneath the thoracic inlet. There is not an extensive pattern of lymphadenopathy.  5/25  ESR >> 122 CRP >> 34.1  RF >> 21.3 elevated ANA >> no results seen ANCA >> (-) ACE >> 22 Eosinophils >> 0.3  5/31 Jo1 >> <0.2 ANA >> (-) SCL70 >> <0.2 Sm >> <0.2 HIV >> (-) CCP >> (4)  CULTURES: None  ANTIBIOTICS: None  SIGNIFICANT EVENTS:   LINES/TUBES: PIV  DISCUSSION: [name] is a 75F with hypoxemic respiratory failure that has been progressive over the last month or so. Initial workup was notable for rhino/entero virus and resultant ARDS (now fibrotic stage) is certainly a possibility. Her CXR has shown marked progression with increasing involvement of the bases. It  is difficult to know if there is a component of volume overload or this is merely progression of her underlying pathology. Interestingly, her initial CT showed more apical disease. She clearly does not fit a UIP pattern. It does not appear that she is steroid responsive.   ASSESSMENT / PLAN:  PULMONARY A: Acute hypoxemic respiratory failure, likely multifactorial and related to underlying ILD vs volume overload vs sequelae of ARDS P:   Continue supplemental oxygen - wean as tolerated for sats >90% (previously required 3L at rest, 4L with exertion) Continue aggressive management of volume status Consider repeat CT imaging to assess progression of disease If able to wean O2, could consider bronchoscopy with BAL for diagnostic purposes Ultimately,  an open lung biopsy may be necessary for diagnosis Doubt utility of continued high dose steroids -> taper to off Continue nebulized treatments if she derives clinical benefit  CARDIOVASCULAR A:  Hx CAD Hx abdominal aortic aneurysm repair HTN HLD P:  Continue home meds  RENAL A:   ESRD on HD P:   Dialysis and volume removal as tolerated  GASTROINTESTINAL A:   No acute issue P:     HEMATOLOGIC A:   Chronic anemia P:    INFECTIOUS A:   No evidence of acute infection P:     ENDOCRINE A:   Hypothyroidism P:     FAMILY  - Updates: daughter at bedside. We did discuss her code status. She wishes to remain FULL CODE at this time.  - Inter-disciplinary family meet or Palliative Care meeting due by:  day 7  The patient is critically ill with multiple organ system failure and requires high complexity decision making for assessment and support, frequent evaluation and titration of therapies, advanced monitoring, review of radiographic studies and interpretation of complex data.   Critical Care Time devoted to patient care services, exclusive of separately billable procedures, described in this note is 35 minutes.   Yisroel Ramming, MD Pulmonary and [place] Pager: [phone]  09/01/2015, 12:07 AM

## 2015-09-02 ENCOUNTER — Inpatient Hospital Stay (HOSPITAL_COMMUNITY): Payer: Medicare HMO

## 2015-09-02 DIAGNOSIS — E43 Unspecified severe protein-calorie malnutrition: Secondary | ICD-10-CM

## 2015-09-02 MED ORDER — METHYLPREDNISOLONE SODIUM SUCC 40 MG IJ SOLR
40.0000 mg | Freq: Two times a day (BID) | INTRAMUSCULAR | Status: DC
  Administered 2015-09-02 – 2015-09-03 (×2): 40 mg via INTRAVENOUS
  Filled 2015-09-02 (×2): qty 1

## 2015-09-02 MED ORDER — CLONAZEPAM 0.5 MG PO TABS
0.2500 mg | ORAL_TABLET | Freq: Once | ORAL | Status: AC
  Administered 2015-09-02: 0.25 mg via ORAL
  Filled 2015-09-02: qty 1

## 2015-09-02 NOTE — Progress Notes (Signed)
PT Cancellation Note  Patient Details Name: [name] MRN: [id number] DOB: Nov 20, 1934   Cancelled Treatment:    Reason Eval/Treat Not Completed: Medical issues which prohibited therapy.  Per conversation w/ RN pt desats to low 80s w/ bed mobility.  Will hold PT until pt more medically appropriate for physical activity.   Collie Siad PT, DPT  Pager: [phone] Phone: [phone] 09/02/2015, 3:45 PM

## 2015-09-02 NOTE — Progress Notes (Signed)
[facility] Progress Note   Subjective: wt 56.3kg, lowest wt this admission ~53kg  Filed Vitals:   09/02/15 0200 09/02/15 0231 09/02/15 0300 09/02/15 0400  BP:  148/80  146/75  Pulse: 62 67 69 64  Temp:    98.2 F (36.8 C)  TempSrc:    Oral  Resp: 19 25 25 22   Height:      Weight:      SpO2: 99% 100% 100% 95%    Inpatient medications: . arformoterol  15 mcg Nebulization BID  . aspirin EC  81 mg Oral Daily  . budesonide  0.5 mg Nebulization BID  . calcitRIOL  0.25 mcg Oral Daily  . calcium acetate  2,001 mg Oral TID WC  . citalopram  20 mg Oral Daily  . [START ON 09/22/2015] cyanocobalamin  1,000 mcg Intramuscular Q30 days  . epoetin (EPOGEN/PROCRIT) injection  6,000 Units Intravenous Q T,Th,Sa-HD  . famotidine  20 mg Oral Daily  . feeding supplement  1 Container Oral BID BM  . fluticasone  2 spray Each Nare Daily  . guaiFENesin  1,200 mg Oral BID  . heparin  5,000 Units Subcutaneous Q8H  . ipratropium-albuterol  3 mL Nebulization TID  . levothyroxine  50 mcg Oral QAC breakfast  . methylPREDNISolone (SOLU-MEDROL) injection  40 mg Intravenous Q6H  . pantoprazole  40 mg Oral Daily  . pravastatin  80 mg Oral QHS  . senna-docusate  1 tablet Oral Daily     sodium chloride, sodium chloride, acetaminophen **OR** acetaminophen, alteplase, bisacodyl, calcium carbonate, heparin, lidocaine (PF), lidocaine-prilocaine, meclizine, nitroGLYCERIN, pentafluoroprop-tetrafluoroeth, zolpidem  Exam: Gen awake, [place] O2, not in distress, calm, hesitant No rash, cyanosis or gangrene Sclera anicteric, throat clear  No jvd or bruits Chest bilat scattered rales, irregular RRR no MRG Abd soft ntnd no mass or ascites +bs GU defer MS no joint effusions or deformity Ext no LE edema / no wounds or ulcers Neuro is alert, Ox 3 , nf   CT chest on 08/08/15 > Throughout both lungs, there are geographic areas of septal thickening and ground-glass opacity in a patchy distribution. Some  lobules are affected an others are not. Most common cause of this pattern is alveolar proteinosis. Other causes can include pneumocystis pneumonia, bronchoalveolar carcinoma, sarcoid, nonspecific interstitial pneumonia and cryptogenic organizing pneumonia. Adult respiratory distress syndrome and pulmonary hemorrhage can also cause this. CXR 6/16 > poor inspiration, bilat diffuse edema vs infiltrates, R > L    Assessment: 1 Hypoxemia / diffuse pulm infiltrates - unclear cause, but failed attempts to clear w HD and abx. W/U per pulm in progress. CXR has chronic changes which look like edema and can't be used to determine vol status 2 CKD 5, new ESRD - started HD on 6/8, sp HD x 5. AVF infiltrated and resting, R IJ cath in place 3 HTN - no bp meds at this time, normal BP 4 Volume - no vol excess on exam 5 Anemia Hb 9.7, follow, no esa for now 6 CAD/hx of stent 7 AAA sp stent-graft repair  Plan - HD tomorrow, try UF 2 L as Ronnie Derby MD [place] pager [phone]    cell [phone] 09/02/2015, 5:52 AM    Recent Labs Lab 08/26/15 0609 08/29/15 0548 08/30/15 0537 09/06/2015 0548 09/01/15 0624  NA 134* 133* 133* 132* 136  K 4.9 4.2 4.9 4.8 4.8  CL 97* 98* 97* 97* 98*  CO2 24 26 24 25 28   GLUCOSE 118*  91 136* 105* 118*  BUN 94* 59* 81* 97* 40*  CREATININE 5.57* 4.52* 5.69* 6.08* 3.62*  CALCIUM 8.3* 8.2* 8.5* 8.4* 8.4*  PHOS 8.5* 6.2*  --   --  5.9*    Recent Labs Lab 08/26/15 0609 08/29/15 0548 09/01/15 0624  ALBUMIN 2.6* 2.6* 2.4*    Recent Labs Lab 08/26/15 0609 08/29/15 0546 09/04/2015 0540  WBC 9.4 7.5 11.3*  HGB 9.8* 10.6* 9.7*  HCT 29.7* 32.5* 29.4*  MCV 93.7 94.8 95.5  PLT 200 163 201   Iron/TIBC/Ferritin/ %Sat    Component Value Date/Time   IRON 40 08/22/2015 0437   TIBC 190* 08/22/2015 0437   FERRITIN 646* 08/22/2015 0437   IRONPCTSAT 21 08/22/2015 0437

## 2015-09-02 NOTE — Progress Notes (Signed)
PROGRESS NOTE    [name]  [id number] DOB: 09/23/1934 DOA: 09/13/2015 PCP: Sherrie Mustache, MD Outpatient Specialists:    Brief Narrative:  81 yof was recently hospitalized at [facility] and discharged on 6/1 after being treated for shortness of breath and associated diffuse interstitial infiltrates of unclear cause. She presented back to the ED  with complaints of SOB and found to be hypoxic. She was re admitted for respiratory failure. Patient initially required BiPAP and was started on intravenous steroids with some improvement. She was able to wean down nasal cannula, but over the last few days has had a progressive decline.  Her chronic kidney disease progressed to end stage renal disease this hospitalization and hemodialysis was initiated. Patient has undergone 3x sessions of HD, but has been having difficulty tolerating entire sessions. It was felt that she needed a pulmonology evaluation and possibly further work up. Due to lack of pulmonology coverage at [facility] she was transferred to [facility] where she was seen by pulm and they signed off.  O2 sats were weaned from 10L at AP to 5L overnight with no further intervention.  Assessment & Plan:   Active Problems:   ILD (interstitial lung disease) ([place])   Interstitial lung disease ([place])   ESRD on dialysis ([place])   Acute respiratory failure with hypoxia (HCC)   Protein-calorie malnutrition, severe   Acute on chronic respiratory failure (HCC)  Acute on chronic hypoxic respiratory failure, multifactorial.   -Predominantly interstitial lung disease of unclear etiology.  -Pulmonary edema component appears resolved - recently started on home O2 (3L at rest, 4 with exertion). -Patient was seen by pulmonology earlier this month, with uncertain differential including autoimmune disease, pulmonary fibrosis, BOOP, viral pneumonitis, and pulmonary edema Recent rhinovirus positive on last admission Bronchoscopy with BAL and lung biopsy  are being considered as an outpatient -wean steroids per pulm  Acute renal failure which has progressed to end-stage renal disease.  -Hemodialysis initiated this hospitalization.  - difficulty tolerating entire sessions -Unable to be dialyzed from fistula--- had new HD catheter placed 6/16  Acute on chronic combined heart failure, appears compensated. LVEF 45-50%. Procardia was discontinued on previous discharge since it can contribute to V/Q mismatch. Management of volume status done with HD.  Coronary artery disease. Stable. Continue aspirin and statins.  Anemia of chronic kidney disease, stable.  DVT prophylaxis: SCDs Code Status: Full Family Communication: Discussed with patient, family on 6/17  Disposition Plan: SNf - once able to tolerate dialysis   Consultants:  Nephrology   General surgery  Procedures:  Hemodialysis 6/11  Hemodialysis 6/13  Hemodialysis 6/16  Tunneled dialysis catheter insertion 6/16, Dr. Rosana Hoes  Antimicrobials:  None  Subjective: Says she is SOB-- O2 sats 100% on 5L   Objective: Filed Vitals:   09/02/15 0800 09/02/15 0846 09/02/15 1000 09/02/15 1207  BP: 147/102  142/71 121/68  Pulse: 63  71 75  Temp:    97.6 F (36.4 C)  TempSrc:    Oral  Resp: 22  30 30   Height:      Weight:      SpO2: 100% 99% 95% 94%    Intake/Output Summary (Last 24 hours) at 09/02/15 1243 Last data filed at 09/02/15 1000  Gross per 24 hour  Intake    615 ml  Output      0 ml  Net    615 ml   Filed Weights   08/22/2015 2330 09/01/15 0329 09/02/15 0500  Weight: 55 kg (121 lb  4.1 oz) 56.3 kg (124 lb 1.9 oz) 55.4 kg (122 lb 2.2 oz)    Examination:  General exam: appears anxious Respiratory system: no wheezing, appears clear Cardiovascular system: S1 & S2 heard, RRR. No JVD, murmurs, rubs, gallops or clicks. No pedal edema. Gastrointestinal system: Abdomen is nondistended, soft and nontender. No organomegaly or masses felt. Normal bowel sounds  heard. Central nervous system: Alert and oriented. No focal neurological deficits.    Data Reviewed: I have personally reviewed following labs and imaging studies  CBC:  Recent Labs Lab 08/29/15 0546 08/19/2015 0540  WBC 7.5 11.3*  HGB 10.6* 9.7*  HCT 32.5* 29.4*  MCV 94.8 95.5  PLT 163 [id number]   Basic Metabolic Panel:  Recent Labs Lab 08/29/15 0548 08/30/15 0537 08/17/2015 0548 09/01/15 0624  NA 133* 133* 132* 136  K 4.2 4.9 4.8 4.8  CL 98* 97* 97* 98*  CO2 26 24 25 28   GLUCOSE 91 136* 105* 118*  BUN 59* 81* 97* 40*  CREATININE 4.52* 5.69* 6.08* 3.62*  CALCIUM 8.2* 8.5* 8.4* 8.4*  PHOS 6.2*  --   --  5.9*   GFR: Estimated Creatinine Clearance: 11.2 mL/min (by C-G formula based on Cr of 3.62). Liver Function Tests:  Recent Labs Lab 08/29/15 0548 09/01/15 0624  ALBUMIN 2.6* 2.4*   Urine analysis:    Component Value Date/Time   COLORURINE YELLOW 08/08/2015 0550   APPEARANCEUR CLOUDY* 08/08/2015 0550   LABSPEC 1.015 08/08/2015 0550   PHURINE 5.5 08/08/2015 0550   GLUCOSEU NEGATIVE 08/08/2015 0550   HGBUR TRACE* 08/08/2015 0550   BILIRUBINUR NEGATIVE 08/08/2015 0550   KETONESUR NEGATIVE 08/08/2015 0550   PROTEINUR 30* 08/08/2015 0550   UROBILINOGEN 0.2 12/21/2012 1300   NITRITE NEGATIVE 08/08/2015 0550   LEUKOCYTESUR NEGATIVE 08/08/2015 0550     ) Recent Results (from the past 240 hour(s))  Surgical pcr screen     Status: None   Collection Time: 08/29/2015  8:04 AM  Result Value Ref Range Status   MRSA, PCR NEGATIVE NEGATIVE Final   Staphylococcus aureus NEGATIVE NEGATIVE Final    Comment:        The Xpert SA Assay (FDA approved for NASAL specimens in patients over 71 years of age), is one component of a comprehensive surveillance program.  Test performance has been validated by [facility] for patients greater than or equal to 26 year old. It is not intended to diagnose infection nor to guide or monitor treatment.     Radiology Studies: Dg  Chest Port 1 View  09/02/2015  CLINICAL DATA:  Pneumonia. EXAM: PORTABLE CHEST 1 VIEW COMPARISON:  Radiograph of August 31, 2015. FINDINGS: Stable cardiomegaly. Right internal jugular dialysis catheter is unchanged in position. Elevated right hemidiaphragm is unchanged. No pneumothorax or significant pleural effusion is noted. Diffuse interstitial densities are noted concerning for pulmonary edema. Bony thorax is unremarkable. IMPRESSION: Cardiomegaly with bilateral diffuse interstitial densities concerning for pulmonary edema. Electronically Signed   By: Marijo Conception, M.D.   On: 09/02/2015 09:05   Scheduled Meds: . arformoterol  15 mcg Nebulization BID  . aspirin EC  81 mg Oral Daily  . budesonide  0.5 mg Nebulization BID  . calcitRIOL  0.25 mcg Oral Daily  . calcium acetate  2,001 mg Oral TID WC  . citalopram  20 mg Oral Daily  . [START ON 09/22/2015] cyanocobalamin  1,000 mcg Intramuscular Q30 days  . epoetin (EPOGEN/PROCRIT) injection  6,000 Units Intravenous Q T,Th,Sa-HD  . famotidine  20  mg Oral Daily  . feeding supplement  1 Container Oral BID BM  . fluticasone  2 spray Each Nare Daily  . guaiFENesin  1,200 mg Oral BID  . heparin  5,000 Units Subcutaneous Q8H  . ipratropium-albuterol  3 mL Nebulization TID  . levothyroxine  50 mcg Oral QAC breakfast  . methylPREDNISolone (SOLU-MEDROL) injection  40 mg Intravenous Q12H  . pantoprazole  40 mg Oral Daily  . pravastatin  80 mg Oral QHS  . senna-docusate  1 tablet Oral Daily   Continuous Infusions:     LOS: 13 days   Time spent: 35 minutes  Eulogio Bear [facility] Pager [phone]  If 7PM-7AM, please contact night-coverage [email or web address] Password [facility] 09/02/2015, 12:43 PM

## 2015-09-03 ENCOUNTER — Inpatient Hospital Stay (HOSPITAL_COMMUNITY): Payer: Medicare HMO

## 2015-09-03 ENCOUNTER — Encounter (HOSPITAL_COMMUNITY): Payer: Self-pay | Admitting: Surgery

## 2015-09-03 DIAGNOSIS — J9601 Acute respiratory failure with hypoxia: Secondary | ICD-10-CM

## 2015-09-03 DIAGNOSIS — N186 End stage renal disease: Secondary | ICD-10-CM

## 2015-09-03 DIAGNOSIS — I5033 Acute on chronic diastolic (congestive) heart failure: Secondary | ICD-10-CM

## 2015-09-03 DIAGNOSIS — J9620 Acute and chronic respiratory failure, unspecified whether with hypoxia or hypercapnia: Secondary | ICD-10-CM

## 2015-09-03 DIAGNOSIS — J9600 Acute respiratory failure, unspecified whether with hypoxia or hypercapnia: Secondary | ICD-10-CM

## 2015-09-03 DIAGNOSIS — Z992 Dependence on renal dialysis: Secondary | ICD-10-CM | POA: Insufficient documentation

## 2015-09-03 DIAGNOSIS — R0600 Dyspnea, unspecified: Secondary | ICD-10-CM

## 2015-09-03 DIAGNOSIS — Z452 Encounter for adjustment and management of vascular access device: Secondary | ICD-10-CM

## 2015-09-03 LAB — BLOOD GAS, ARTERIAL
Acid-Base Excess: 4.9 mmol/L — ABNORMAL HIGH (ref 0.0–2.0)
Bicarbonate: 28.7 mEq/L — ABNORMAL HIGH (ref 20.0–24.0)
Drawn by: 246861
FIO2: 100
O2 Saturation: 95.1 %
PCO2 ART: 41.3 mmHg (ref 35.0–45.0)
PH ART: 7.457 — AB (ref 7.350–7.450)
PO2 ART: 79.2 mmHg — AB (ref 80.0–100.0)
Patient temperature: 98.6
TCO2: 30 mmol/L (ref 0–100)

## 2015-09-03 LAB — BODY FLUID CELL COUNT WITH DIFFERENTIAL
Eos, Fluid: 0 %
Eos, Fluid: 0 %
LYMPHS FL: 9 %
Lymphs, Fluid: 7 %
Monocyte-Macrophage-Serous Fluid: 75 % (ref 50–90)
Monocyte-Macrophage-Serous Fluid: 75 % (ref 50–90)
NEUTROPHIL FLUID: 16 % (ref 0–25)
Neutrophil Count, Fluid: 18 % (ref 0–25)
Total Nucleated Cell Count, Fluid: 87 cu mm (ref 0–1000)
WBC FLUID: 64 uL (ref 0–1000)

## 2015-09-03 LAB — POCT I-STAT 3, ART BLOOD GAS (G3+)
ACID-BASE EXCESS: 3 mmol/L — AB (ref 0.0–2.0)
ACID-BASE EXCESS: 2 mmol/L (ref 0.0–2.0)
BICARBONATE: 29.8 meq/L — AB (ref 20.0–24.0)
BICARBONATE: 28.7 meq/L — AB (ref 20.0–24.0)
O2 SAT: 80 %
O2 Saturation: 99 %
PCO2 ART: 53.5 mmHg — AB (ref 35.0–45.0)
PH ART: 7.354 (ref 7.350–7.450)
PO2 ART: 173 mmHg — AB (ref 80.0–100.0)
TCO2: 31 mmol/L (ref 0–100)
TCO2: 30 mmol/L (ref 0–100)
pCO2 arterial: 52.6 mmHg — ABNORMAL HIGH (ref 35.0–45.0)
pH, Arterial: 7.344 — ABNORMAL LOW (ref 7.350–7.450)
pO2, Arterial: 48 mmHg — ABNORMAL LOW (ref 80.0–100.0)

## 2015-09-03 LAB — RENAL FUNCTION PANEL
Albumin: 2.4 g/dL — ABNORMAL LOW (ref 3.5–5.0)
Anion gap: 9 (ref 5–15)
BUN: 78 mg/dL — AB (ref 6–20)
CALCIUM: 8.2 mg/dL — AB (ref 8.9–10.3)
CHLORIDE: 97 mmol/L — AB (ref 101–111)
CO2: 27 mmol/L (ref 22–32)
CREATININE: 4.9 mg/dL — AB (ref 0.44–1.00)
GFR, EST AFRICAN AMERICAN: 9 mL/min — AB (ref >60)
GFR, EST NON AFRICAN AMERICAN: 8 mL/min — AB (ref >60)
Glucose, Bld: 91 mg/dL (ref 65–99)
Phosphorus: 3.6 mg/dL (ref 2.5–4.6)
Potassium: 4.2 mmol/L (ref 3.5–5.1)
SODIUM: 133 mmol/L — AB (ref 135–145)

## 2015-09-03 LAB — GLUCOSE, CAPILLARY
GLUCOSE-CAPILLARY: 103 mg/dL — AB (ref 65–99)
GLUCOSE-CAPILLARY: 100 mg/dL — AB (ref 65–99)
Glucose-Capillary: 91 mg/dL (ref 65–99)

## 2015-09-03 LAB — CBC
HCT: 30.3 % — ABNORMAL LOW (ref 36.0–46.0)
Hemoglobin: 9.4 g/dL — ABNORMAL LOW (ref 12.0–15.0)
MCH: 29.2 pg (ref 26.0–34.0)
MCHC: 31 g/dL (ref 30.0–36.0)
MCV: 94.1 fL (ref 78.0–100.0)
PLATELETS: 197 10*3/uL (ref 150–400)
RBC: 3.22 MIL/uL — AB (ref 3.87–5.11)
RDW: 17.1 % — AB (ref 11.5–15.5)
WBC: 11.5 10*3/uL — AB (ref 4.0–10.5)

## 2015-09-03 LAB — PHOSPHORUS
PHOSPHORUS: 5.9 mg/dL — AB (ref 2.5–4.6)
Phosphorus: 6 mg/dL — ABNORMAL HIGH (ref 2.5–4.6)

## 2015-09-03 LAB — SEDIMENTATION RATE: Sed Rate: 34 mm/hr — ABNORMAL HIGH (ref 0–22)

## 2015-09-03 LAB — MAGNESIUM
MAGNESIUM: 2.2 mg/dL (ref 1.7–2.4)
Magnesium: 2.2 mg/dL (ref 1.7–2.4)

## 2015-09-03 MED ORDER — VITAL HIGH PROTEIN PO LIQD: 1000.0000 mL | ORAL | Status: DC

## 2015-09-03 MED ORDER — ANTISEPTIC ORAL RINSE SOLUTION (CORINZ)
7.0000 mL | Freq: Four times a day (QID) | OROMUCOSAL | Status: DC
  Administered 2015-09-03 (×2): 7 mL via OROMUCOSAL

## 2015-09-03 MED ORDER — PENTAFLUOROPROP-TETRAFLUOROETH EX AERO: 1.0000 "application " | INHALATION_SPRAY | CUTANEOUS | Status: DC | PRN

## 2015-09-03 MED ORDER — PRO-STAT SUGAR FREE PO LIQD
30.0000 mL | Freq: Three times a day (TID) | ORAL | Status: DC
  Administered 2015-09-04 – 2015-09-05 (×4): 30 mL
  Filled 2015-09-03 (×4): qty 30

## 2015-09-03 MED ORDER — PHENYLEPHRINE HCL 10 MG/ML IJ SOLN
30.0000 ug/min | INTRAMUSCULAR | Status: DC
  Administered 2015-09-03: 5 ug/min via INTRAVENOUS
  Administered 2015-09-04: 6 ug/min via INTRAVENOUS
  Filled 2015-09-03 (×2): qty 1

## 2015-09-03 MED ORDER — FAMOTIDINE IN NACL 20-0.9 MG/50ML-% IV SOLN
20.0000 mg | INTRAVENOUS | Status: DC
  Administered 2015-09-04 – 2015-09-05 (×2): 20 mg via INTRAVENOUS
  Filled 2015-09-03 (×2): qty 50

## 2015-09-03 MED ORDER — FENTANYL CITRATE (PF) 100 MCG/2ML IJ SOLN
100.0000 ug | Freq: Once | INTRAMUSCULAR | Status: AC
  Administered 2015-09-03: 100 ug via INTRAVENOUS

## 2015-09-03 MED ORDER — LIDOCAINE HCL (PF) 1 % IJ SOLN: 5.0000 mL | INTRAMUSCULAR | Status: DC | PRN

## 2015-09-03 MED ORDER — FENTANYL BOLUS VIA INFUSION
25.0000 ug | INTRAVENOUS | Status: DC | PRN
  Administered 2015-09-03 – 2015-09-05 (×2): 25 ug via INTRAVENOUS
  Filled 2015-09-03: qty 25

## 2015-09-03 MED ORDER — FENTANYL CITRATE (PF) 100 MCG/2ML IJ SOLN
INTRAMUSCULAR | Status: AC
  Administered 2015-09-03: 100 ug via INTRAVENOUS
  Filled 2015-09-03: qty 2

## 2015-09-03 MED ORDER — LIDOCAINE-PRILOCAINE 2.5-2.5 % EX CREA
1.0000 "application " | TOPICAL_CREAM | CUTANEOUS | Status: DC | PRN
  Filled 2015-09-03: qty 5

## 2015-09-03 MED ORDER — MIDAZOLAM HCL 2 MG/2ML IJ SOLN
1.0000 mg | INTRAMUSCULAR | Status: DC | PRN
  Administered 2015-09-03 (×2): 1 mg via INTRAVENOUS
  Filled 2015-09-03 (×2): qty 2

## 2015-09-03 MED ORDER — CHLORHEXIDINE GLUCONATE 0.12% ORAL RINSE (MEDLINE KIT)
15.0000 mL | Freq: Two times a day (BID) | OROMUCOSAL | Status: DC
  Administered 2015-09-03: 15 mL via OROMUCOSAL

## 2015-09-03 MED ORDER — ANTISEPTIC ORAL RINSE SOLUTION (CORINZ)
7.0000 mL | OROMUCOSAL | Status: DC
  Administered 2015-09-03 – 2015-09-05 (×20): 7 mL via OROMUCOSAL

## 2015-09-03 MED ORDER — FENTANYL CITRATE (PF) 100 MCG/2ML IJ SOLN
50.0000 ug | Freq: Once | INTRAMUSCULAR | Status: AC
  Administered 2015-09-03: 50 ug via INTRAVENOUS
  Filled 2015-09-03: qty 2

## 2015-09-03 MED ORDER — SODIUM CHLORIDE 0.9 % IV SOLN
125.0000 mg | INTRAVENOUS | Status: DC
  Filled 2015-09-03 (×2): qty 10

## 2015-09-03 MED ORDER — IPRATROPIUM-ALBUTEROL 0.5-2.5 (3) MG/3ML IN SOLN
3.0000 mL | Freq: Four times a day (QID) | RESPIRATORY_TRACT | Status: DC
  Administered 2015-09-03 – 2015-09-05 (×10): 3 mL via RESPIRATORY_TRACT
  Filled 2015-09-03 (×10): qty 3

## 2015-09-03 MED ORDER — SODIUM CHLORIDE 0.9 % IV SOLN: 100.0000 mL | INTRAVENOUS | Status: DC | PRN

## 2015-09-03 MED ORDER — HEPARIN SODIUM (PORCINE) 1000 UNIT/ML DIALYSIS: 2500.0000 [IU] | INTRAMUSCULAR | Status: DC | PRN

## 2015-09-03 MED ORDER — DARBEPOETIN ALFA 100 MCG/0.5ML IJ SOSY
100.0000 ug | PREFILLED_SYRINGE | INTRAMUSCULAR | Status: DC
  Filled 2015-09-03: qty 0.5

## 2015-09-03 MED ORDER — HEPARIN SODIUM (PORCINE) 1000 UNIT/ML DIALYSIS: 1000.0000 [IU] | INTRAMUSCULAR | Status: DC | PRN

## 2015-09-03 MED ORDER — PRO-STAT SUGAR FREE PO LIQD: 30.0000 mL | Freq: Two times a day (BID) | ORAL | Status: DC

## 2015-09-03 MED ORDER — SODIUM CHLORIDE 0.9 % IV SOLN
100.0000 mL | INTRAVENOUS | Status: DC | PRN
Start: 2015-09-03 — End: 2015-09-03

## 2015-09-03 MED ORDER — ETOMIDATE 2 MG/ML IV SOLN
15.0000 mg | Freq: Once | INTRAVENOUS | Status: AC
  Administered 2015-09-03: 15 mg via INTRAVENOUS

## 2015-09-03 MED ORDER — FENTANYL CITRATE (PF) 100 MCG/2ML IJ SOLN
INTRAMUSCULAR | Status: AC
  Administered 2015-09-03: 100 ug
  Filled 2015-09-03: qty 2

## 2015-09-03 MED ORDER — VITAL AF 1.2 CAL PO LIQD
1000.0000 mL | ORAL | Status: DC
Start: 2015-09-03 — End: 2015-09-06
  Administered 2015-09-04 – 2015-09-05 (×2): 1000 mL

## 2015-09-03 MED ORDER — ALTEPLASE 2 MG IJ SOLR: 2.0000 mg | Freq: Once | INTRAMUSCULAR | Status: DC | PRN

## 2015-09-03 MED ORDER — MIDAZOLAM HCL 2 MG/2ML IJ SOLN
INTRAMUSCULAR | Status: AC
  Administered 2015-09-03: 2 mg
  Filled 2015-09-03: qty 2

## 2015-09-03 MED ORDER — SODIUM CHLORIDE 0.9 % IV SOLN
25.0000 ug/h | INTRAVENOUS | Status: DC
  Administered 2015-09-03: 50 ug/h via INTRAVENOUS
  Administered 2015-09-05: 200 ug/h via INTRAVENOUS
  Filled 2015-09-03 (×3): qty 50

## 2015-09-03 MED ORDER — CHLORHEXIDINE GLUCONATE 0.12% ORAL RINSE (MEDLINE KIT)
15.0000 mL | Freq: Two times a day (BID) | OROMUCOSAL | Status: DC
  Administered 2015-09-03 – 2015-09-05 (×5): 15 mL via OROMUCOSAL

## 2015-09-03 MED FILL — Albuterol Sulfate Soln Nebu 0.083% (2.5 MG/3ML): RESPIRATORY_TRACT | Qty: 3 | Status: AC

## 2015-09-03 NOTE — Progress Notes (Signed)
Nutrition Follow-up  DOCUMENTATION CODES:   Severe malnutrition in context of chronic illness  INTERVENTION:   Initiate Vital AF 1.2 @ 35 ml/hr via feeding tube   30 ml Prostat TID.    Tube feeding regimen provides 1308 kcal (104% of needs), 113 grams of protein, and 681 ml of H2O.   Monitor magnesium, potassium, and phosphorus daily for at least 3 days, MD to replete as needed, as pt is at risk for refeeding syndrome given severe malnutrition and limited intake x 2 weeks.  NUTRITION DIAGNOSIS:   Malnutrition related to poor appetite, chronic illness, catabolic illness as evidenced by per patient/family report, percent weight loss. Ongoing.   GOAL:   Patient will meet greater than or equal to 90% of their needs Progressing.   MONITOR:   I & O's, Labs, PO intake, Weight trends  REASON FOR ASSESSMENT:   Consult, Ventilator Enteral/tube feeding initiation and management  ASSESSMENT:   Pt has hx of ESRD. Hospitalized at [facility] from 5/17-6/1. Problems included but not limited AKI-CKD-5, acute CHF, Anemia (received 2 units of PRBC). She was readmitted at [facility] SOB on 6/5. Dr Delrae Sawyers  saw her on 6/6 fluid overload-CKD, Binder (Phoslo) added 6/8 qith all meals due to hyperphosfotemia.   6/16 tx to [facility] for ongoing respiratory failure 6/19 during HD pt intolerant of minimal volume removal (500 ml) and had worsening respiratory distress, tx and intubated. May need CVVHD.   Since 6/7 most meal intake recorded <50%.   Patient is currently intubated on ventilator support MV: 11.5 L/min Temp (24hrs), Avg:98 F (36.7 C), Min:97.6 F (36.4 C), Max:98.5 F (36.9 C)  Medications reviewed and include: phoslo, vitamin B12, senokot-s, phenylephrine Labs reviewed: Na 133, BUN/Cr 78/4.9 Weight has decreased by 12 lb since admission despite neutral balance  Diet Order:  Diet NPO time specified  Skin:  Reviewed, no issues (chest incision)  Last BM:  6/14  Height:   Ht Readings from  Last 1 Encounters:  09/03/15 5\' 3"  (1.6 m)    Weight:   Wt Readings from Last 1 Encounters:  09/03/15 117 lb 15.1 oz (53.5 kg)    Ideal Body Weight:  52.2 kg  BMI:  Body mass index is 20.9 kg/(m^2).  Estimated Nutritional Needs:   Kcal:  1256  Protein:  100-115 grams  Fluid:  >1.5 L/day  EDUCATION NEEDS:   Education needs addressed  Maylon Peppers RD, [place], [place] Pager [phone] After Hours Pager

## 2015-09-03 NOTE — Progress Notes (Signed)
PROGRESS NOTE    [name]  [id number] DOB: October 27, 1934 DOA: 08/31/2015 PCP: Sherrie Mustache, MD Outpatient Specialists:    Brief Narrative:  44 yof was recently hospitalized at [facility] and discharged on 6/1 after being treated for shortness of breath and associated diffuse interstitial infiltrates of unclear cause. She presented back to the ED  with complaints of SOB and found to be hypoxic. She was re admitted for respiratory failure. Patient initially required BiPAP and was started on intravenous steroids with some improvement. She was able to wean down nasal cannula, but over the last few days has had a progressive decline.  Her chronic kidney disease progressed to end stage renal disease this hospitalization and hemodialysis was initiated. Patient has undergone 3x sessions of HD, but has been having difficulty tolerating entire sessions. It was felt that she needed a pulmonology evaluation and possibly further work up. Due to lack of pulmonology coverage at [facility], she was transferred to [facility] where she was seen by pulm.  O2 sats were weaned from 10L at AP to 5L overnight with no further intervention.  She was brought to dialysis on 6/19 where she became hypoxic(into 80s)  and respiratory rate increased.  She was placed on Bipap and O2 sats increased.       Assessment & Plan:   Active Problems:   ILD (interstitial lung disease) ([place])   Interstitial lung disease ([place])   ESRD on dialysis ([place])   Acute respiratory failure with hypoxia (HCC)   Protein-calorie malnutrition, severe   Acute on chronic respiratory failure (HCC)  Acute on chronic hypoxic respiratory failure, multifactorial.   -Predominantly interstitial lung disease of unclear etiology.  -Pulmonary edema component appears resolved- unable to pull fluid off during dialysis - recently started on home O2 (3L at rest, 4 with exertion). -Patient was seen by pulmonology earlier this month, with uncertain differential  including autoimmune disease, pulmonary fibrosis, BOOP, viral pneumonitis, and pulmonary edema Recent rhinovirus positive on last admission Bronchoscopy with BAL and lung biopsy are being considered -wean steroids per pulm -became acutely hypoxic on 6/19 during dialysis--- responded to Bipap, critical care to evaluate and consider doing dialysis in the unit where volume status can be checked and if does not appear volume overloaded, will need further pulmonary work up -x ray done 6/18  Acute renal failure which has progressed to end-stage renal disease.  -Hemodialysis initiated this hospitalization- has had 6 treatments - difficulty tolerating entire sessions and not able to pull any volume off without BP dropping -Unable to be dialyzed from fistula--- had new HD catheter placed 6/16  Acute on chronic combined heart failure, appears compensated. LVEF 45-50%. Procardia was discontinued on previous discharge since it can contribute to V/Q mismatch. Management of volume status done with HD.  Coronary artery disease. Stable. Continue aspirin and statins.  Anemia of chronic kidney disease, stable.  DVT prophylaxis: SCDs Code Status: Full Family Communication: called daughter Disposition Plan: SNf - once medically stable   Consultants:  Nephrology   General surgery  Pulm/critical care  Procedures:  Tunneled dialysis catheter insertion 6/16, Dr. Rosana Hoes    Subjective: Very SOB in dialysis-- increased work of breathing   Objective: Filed Vitals:   09/03/15 0600 09/03/15 0649 09/03/15 0730 09/03/15 0800  BP: 134/78 177/79 96/65   Pulse: 72 72 89   Temp:      TempSrc:  Oral    Resp: 19 26    Height:      Weight:  SpO2: 98% 98%  89%    Intake/Output Summary (Last 24 hours) at 09/03/15 0829 Last data filed at 09/02/15 2200  Gross per 24 hour  Intake    540 ml  Output      0 ml  Net    540 ml   Filed Weights   09/01/15 0329 09/02/15 0500 09/03/15 0408  Weight:  56.3 kg (124 lb 1.9 oz) 55.4 kg (122 lb 2.2 oz) 56 kg (123 lb 7.3 oz)    Examination:  General exam: appears anxious- accessory muscles being used- on NRB Respiratory system: diminished, coarse breath sounds Cardiovascular system: S1 & S2 heard, RRR. Gastrointestinal system: Normal bowel sounds heard. Central nervous system: says few words-- increased work of breathing    Data Reviewed: I have personally reviewed following labs and imaging studies  CBC:  Recent Labs Lab 08/29/15 0546 09/12/2015 0540 09/03/15 0323  WBC 7.5 11.3* 11.5*  HGB 10.6* 9.7* 9.4*  HCT 32.5* 29.4* 30.3*  MCV 94.8 95.5 94.1  PLT 163 201 [id number]   Basic Metabolic Panel:  Recent Labs Lab 08/29/15 0548 08/30/15 0537 08/24/2015 0548 09/01/15 0624 09/03/15 0722  NA 133* 133* 132* 136 133*  K 4.2 4.9 4.8 4.8 4.2  CL 98* 97* 97* 98* 97*  CO2 26 24 25 28 27   GLUCOSE 91 136* 105* 118* 91  BUN 59* 81* 97* 40* 78*  CREATININE 4.52* 5.69* 6.08* 3.62* 4.90*  CALCIUM 8.2* 8.5* 8.4* 8.4* 8.2*  PHOS 6.2*  --   --  5.9* 3.6   GFR: Estimated Creatinine Clearance: 8.4 mL/min (by C-G formula based on Cr of 4.9). Liver Function Tests:  Recent Labs Lab 08/29/15 0548 09/01/15 0624 09/03/15 0722  ALBUMIN 2.6* 2.4* 2.4*   Urine analysis:    Component Value Date/Time   COLORURINE YELLOW 08/08/2015 0550   APPEARANCEUR CLOUDY* 08/08/2015 0550   LABSPEC 1.015 08/08/2015 0550   PHURINE 5.5 08/08/2015 0550   GLUCOSEU NEGATIVE 08/08/2015 0550   HGBUR TRACE* 08/08/2015 0550   BILIRUBINUR NEGATIVE 08/08/2015 0550   KETONESUR NEGATIVE 08/08/2015 0550   PROTEINUR 30* 08/08/2015 0550   UROBILINOGEN 0.2 12/21/2012 1300   NITRITE NEGATIVE 08/08/2015 0550   LEUKOCYTESUR NEGATIVE 08/08/2015 0550     ) Recent Results (from the past 240 hour(s))  Surgical pcr screen     Status: None   Collection Time: 08/24/2015  8:04 AM  Result Value Ref Range Status   MRSA, PCR NEGATIVE NEGATIVE Final   Staphylococcus aureus  NEGATIVE NEGATIVE Final    Comment:        The Xpert SA Assay (FDA approved for NASAL specimens in patients over 79 years of age), is one component of a comprehensive surveillance program.  Test performance has been validated by [facility] for patients greater than or equal to 81 year old. It is not intended to diagnose infection nor to guide or monitor treatment.     Radiology Studies: Dg Chest Port 1 View  09/02/2015  CLINICAL DATA:  Pneumonia. EXAM: PORTABLE CHEST 1 VIEW COMPARISON:  Radiograph of August 31, 2015. FINDINGS: Stable cardiomegaly. Right internal jugular dialysis catheter is unchanged in position. Elevated right hemidiaphragm is unchanged. No pneumothorax or significant pleural effusion is noted. Diffuse interstitial densities are noted concerning for pulmonary edema. Bony thorax is unremarkable. IMPRESSION: Cardiomegaly with bilateral diffuse interstitial densities concerning for pulmonary edema. Electronically Signed   By: Marijo Conception, M.D.   On: 09/02/2015 09:05   Scheduled Meds: . arformoterol  15  mcg Nebulization BID  . aspirin EC  81 mg Oral Daily  . budesonide  0.5 mg Nebulization BID  . calcitRIOL  0.25 mcg Oral Daily  . calcium acetate  2,001 mg Oral TID WC  . citalopram  20 mg Oral Daily  . [START ON 09/22/2015] cyanocobalamin  1,000 mcg Intramuscular Q30 days  . darbepoetin (ARANESP) injection - DIALYSIS  100 mcg Intravenous Q Mon-HD  . famotidine  20 mg Oral Daily  . feeding supplement  1 Container Oral BID BM  . ferric gluconate (FERRLECIT/NULECIT) IV  125 mg Intravenous Q M,W,F-HD  . fluticasone  2 spray Each Nare Daily  . guaiFENesin  1,200 mg Oral BID  . heparin  5,000 Units Subcutaneous Q8H  . ipratropium-albuterol  3 mL Nebulization TID  . levothyroxine  50 mcg Oral QAC breakfast  . methylPREDNISolone (SOLU-MEDROL) injection  40 mg Intravenous Q12H  . pantoprazole  40 mg Oral Daily  . pravastatin  80 mg Oral QHS  . senna-docusate  1 tablet  Oral Daily   Continuous Infusions:     LOS: 14 days   Time spent: 35 minutes  Eulogio Bear [facility] Pager [phone]  If 7PM-7AM, please contact night-coverage [email or web address] Password [email or web address] 09/03/2015, 8:29 AM

## 2015-09-03 NOTE — Procedures (Signed)
Intubation Procedure Note TAMMYLYNN SHANAHAN [id number] 1935-02-15  Procedure: Intubation Indications: Respiratory insufficiency  Procedure Details Consent: Risks of procedure as well as the alternatives and risks of each were explained to the (patient/caregiver).  Consent for procedure obtained. and Unable to obtain consent because of emergent medical necessity. Time Out: Verified patient identification, verified procedure, site/side was marked, verified correct patient position, special equipment/implants available, medications/allergies/relevent history reviewed, required imaging and test results available.  Performed  Maximum sterile technique was used including gown, hand hygiene and mask.  MAC and 3    Evaluation Hemodynamic Status: Transient hypotension treated with fluid; O2 sats: stable throughout Patient's Current Condition: stable Complications: No apparent complications Patient did tolerate procedure well. Chest X-ray ordered to verify placement.  CXR: pending.   [name] 09/03/2015  Glide 3

## 2015-09-03 NOTE — Progress Notes (Signed)
eLink Physician-Brief Progress Note Patient Name: [name] DOB: 1934/06/17 MRN: [id number]   Date of Service  09/03/2015  HPI/Events of Note  Asked to verify NG tube placement. KUB reviewed > NG is in in hiatal hernia above the diaphragm.  eICU Interventions  NG is not cleared to use. Order Reuel Boom Discussed with nurse.     Intervention Category Evaluation Type: Other  Jamy Cleckler 09/03/2015, 3:41 PM

## 2015-09-03 NOTE — Procedures (Signed)
Bronchoscopy Procedure Note [name] [id number] 01/19/1935  Procedure: Bronchoscopy Indications: Diagnostic evaluation of the airways  Procedure Details Consent: Risks of procedure as well as the alternatives and risks of each were explained to the (patient/caregiver).  Consent for procedure obtained. Time Out: Verified patient identification, verified procedure, site/side was marked, verified correct patient position, special equipment/implants available, medications/allergies/relevent history reviewed, required imaging and test results available.  Performed  In preparation for procedure, patient was given 100% FiO2 and bronchoscope lubricated. Sedation: Etomidate  Airway entered and the following bronchi were examined: RUL, RML, RLL, LUL, LLL and Bronchi.   Procedures performed: Brushings performed Bronchoscope removed.  , Patient placed back on 100% FiO2 at conclusion of procedure.    Evaluation Hemodynamic Status: BP stable throughout; O2 sats: stable throughout Patient's Current Condition: stable Specimens:  Sent serosanguinous fluid Complications: No apparent complications Patient did tolerate procedure well.   [name]. 09/03/2015   1. Min to no secretions 2. BAL RUL, LUL 3. Small chronic diverticuli 4. NO DAH 5. ETT adjusted out to 2 cm above carina  [name]. Titus Mould, MD, [place] Pgr: [facility]

## 2015-09-03 NOTE — Progress Notes (Signed)
[facility] Progress Note   Subjective:  Seen in HD - resp distress TOTALLY intolerant of volume removal Dropped BP with 500 cc off, more tachypneic - now requiring BIPAP  Filed Vitals:   09/03/15 0500 09/03/15 0600 09/03/15 0649 09/03/15 0730  BP:  134/78 177/79 96/65  Pulse: 72 72 72 89  Temp:      TempSrc:   Oral   Resp: 20 19 26    Height:      Weight:      SpO2: 95% 98% 98%     Inpatient medications: . arformoterol  15 mcg Nebulization BID  . aspirin EC  81 mg Oral Daily  . budesonide  0.5 mg Nebulization BID  . calcitRIOL  0.25 mcg Oral Daily  . calcium acetate  2,001 mg Oral TID WC  . citalopram  20 mg Oral Daily  . [START ON 09/22/2015] cyanocobalamin  1,000 mcg Intramuscular Q30 days  . famotidine  20 mg Oral Daily  . feeding supplement  1 Container Oral BID BM  . fluticasone  2 spray Each Nare Daily  . guaiFENesin  1,200 mg Oral BID  . heparin  5,000 Units Subcutaneous Q8H  . ipratropium-albuterol  3 mL Nebulization TID  . levothyroxine  50 mcg Oral QAC breakfast  . methylPREDNISolone (SOLU-MEDROL) injection  40 mg Intravenous Q12H  . pantoprazole  40 mg Oral Daily  . pravastatin  80 mg Oral QHS  . senna-docusate  1 tablet Oral Daily     sodium chloride, sodium chloride, sodium chloride, sodium chloride, acetaminophen **OR** acetaminophen, alteplase, bisacodyl, calcium carbonate, heparin, [START ON 09/04/2015] heparin, lidocaine (PF), lidocaine-prilocaine, meclizine, nitroGLYCERIN, pentafluoroprop-tetrafluoroeth, zolpidem  Exam: Gen awake, currently in HD and on BIPAP with RR 47 In resp distress, incr WOB No JVD discernable Best air movement anteriorly at apices.  Reduced at bases bilaterally Heart sounds obscured by resp noise Abd soft ntnd no mass or ascites +bs There is no peripheral edema whatsoever R IJ TDC in use for HD  CT chest on 08/08/15 > Throughout both lungs, there are geographic areas of septal thickening and ground-glass  opacity in a patchy distribution. Some lobules are affected an others are not. Most common cause of this pattern is alveolar proteinosis. Other causes can include pneumocystis pneumonia, bronchoalveolar carcinoma, sarcoid, nonspecific interstitial pneumonia and cryptogenic organizing pneumonia. Adult respiratory distress syndrome and pulmonary hemorrhage can also cause this. CXR 6/16 > poor inspiration, bilat diffuse edema vs infiltrates, R > L    Recent Labs Lab 08/29/15 0548  09/06/2015 0548 09/01/15 0624 09/03/15 0722  NA 133*  < > 132* 136 133*  K 4.2  < > 4.8 4.8 4.2  CL 98*  < > 97* 98* 97*  CO2 26  < > 25 28 27   GLUCOSE 91  < > 105* 118* 91  BUN 59*  < > 97* 40* 78*  CREATININE 4.52*  < > 6.08* 3.62* 4.90*  CALCIUM 8.2*  < > 8.4* 8.4* 8.2*  PHOS 6.2*  --   --  5.9* 3.6  < > = values in this interval not displayed.  Recent Labs Lab 08/29/15 0548 09/01/15 0624  ALBUMIN 2.6* 2.4*    Recent Labs Lab 08/29/15 0546 09/10/2015 0540 09/03/15 0323  WBC 7.5 11.3* 11.5*  HGB 10.6* 9.7* 9.4*  HCT 32.5* 29.4* 30.3*  MCV 94.8 95.5 94.1  PLT 163 201 197   Iron/TIBC/Ferritin/ %Sat    Component Value Date/Time   IRON 40 08/22/2015 0437  TIBC 190* 08/22/2015 0437   FERRITIN 646* 08/22/2015 0437   IRONPCTSAT 21 08/22/2015 0437   Assessment: 1. Hypoxemia / diffuse pulm infiltrates - unclear cause, but failed attempts to clear w HD and abx. She has had 6 HD treatments and unable to remove ANY substantial amount of volume - now requiring BIPAP in HD - lowest O2 this admission 5 liters.  MAYBE THIS IS NOT A VOLUME ISSUE.  CXR has chronic changes which look like edema and can't be used to determine vol status. I would think it appropriate to move to ICU, place central line to assess intravascular volume, and if not volume, more aggressive attempt to determine cause infiltrates including bronch. (prior workup - viral panel norovirus. ANA, HIV, ACE and ANCA negative. Antibodies  (anti-Jo, scleroderma, Smith) and CCP Ab's negative. RF and CRP elevated). 2. Acute on chronic resp failure - requiring BIPAP in HD. PO2 79 on  3. CKD 5, new ESRD - started HD on 6/8, sp HD x 5. Today is HD #6 and not tolerating any volume removal - so just doing HD for clearance right now.  4. LUE BC AVF (04/20/15)- AVF infiltrated and resting, R IJ [facility] cath in place (09/07/2015 Dr. Rosana Hoes [facility]) 5. Volume - NO vol excess on exam 6. Anemia Hb 9.4. Add Aranesp.  7. CAD/hx of stent 8. AAA sp stent-graft repair  Jamal Maes, MD [facility] [phone] Pager 09/03/2015, 8:10 AM

## 2015-09-03 NOTE — Progress Notes (Signed)
RT note- Patient was initially placed on Bipap in dialysis then transported to [facility]. Intubation and Bronchoscopy performed by Dr. Titus Mould. Placed on 8cc/kg.

## 2015-09-03 NOTE — Progress Notes (Signed)
Physical Therapy Discharge Patient Details Name: [name] MRN: [id number] DOB: 1934/09/18 Today's Date: 09/03/2015 Time:  -     Patient discharged from PT services secondary to medical decline - will need to re-order PT to resume therapy services. (noted MD cancelled PT orders this a.m. Due to medical decline)  Please see latest therapy progress note for current level of functioning and progress toward goals.    Progress and discharge plan discussed with patient and/or caregiver: Patient unable to participate in discharge planning and no caregivers available  GP     [name] 09/03/2015, 10:00 AM

## 2015-09-03 NOTE — Procedures (Signed)
I have personally attended this patient's dialysis session.   Marked resp distress with drop in BP when attempted just 500 cc vol removal Requiring BIPAP Labs look OK Terminating treatment early - going back to unit. CCM evaluating patient  Jamal Maes, MD [facility] [phone] Pager 09/03/2015, 8:31 AM

## 2015-09-03 NOTE — Progress Notes (Signed)
Placed patient on BiPAP per order, patient tolerated well, ABG send to lab.

## 2015-09-03 NOTE — Significant Event (Signed)
Rapid Response Event Note  Overview: Time Called: [id number] Arrival Time: 0740 Event Type: Respiratory  Initial Focused Assessment: Patient on hemodialysis since 0654.  About 7:30 she began to have labored breathing and o2 sats dropped to 70% on 5l  HFNC.  Placed on NRB mask, O2 sats improved to 93% for a few minutes then decreased to 89 %.  RR 35-45  Using accessory muscles  Lung sounds with crackles at bases.  Prior to  Starting HD pts BP 144/78  With fluid removal BP decreased to  87/62 then improved to 123/75 when kept even.   She is drowsy, but easily woken and responds weakly  Interventions: Dr Eliseo Squires notified and at bedside ABG done  7.45/41/79/28 on NRB Placed on Bipap O2 sats improved to 95% but  RR 30s and she has paradoxical breathing CCM consulted Dr Titus Mould at bedside Plan transfer to ICU for intubation and bronch. Transferred to [facility] via bed with O2 via bipap and heart monitor. Dr Titus Mould spoke with daughter, at bedside briefly.    Plan of Care (if not transferred):  Event Summary: Name of Physician Notified: Eliseo Squires at 40  Name of Consulting Physician Notified: ccm at 0758  Outcome: Transferred (Comment) [facility])  Event End Time: [id number]  Raliegh Ip

## 2015-09-03 NOTE — Procedures (Signed)
Central Venous Catheter Insertion Procedure Note [name] [id number] 02-Jan-1935  Procedure: Insertion of Central Venous Catheter Indications: Assessment of intravascular volume and Drug and/or fluid administration  Procedure Details Consent: Risks of procedure as well as the alternatives and risks of each were explained to the (patient/caregiver).  Consent for procedure obtained. Time Out: Verified patient identification, verified procedure, site/side was marked, verified correct patient position, special equipment/implants available, medications/allergies/relevent history reviewed, required imaging and test results available.  Performed  Maximum sterile technique was used including antiseptics, cap, gloves, gown, hand hygiene, mask and sheet. Skin prep: Chlorhexidine; local anesthetic administered A antimicrobial bonded/coated triple lumen catheter was placed in the left internal jugular vein using the Seldinger technique.  Evaluation Blood flow good Complications: No apparent complications Patient did tolerate procedure well. Chest X-ray ordered to verify placement.  CXR: pending.   Performed using ultrasound guidance.  Wire visualized in vessel under ultrasound.   Placed by Marda Stalker NP under my direct supervision   Nickolas Madrid, NP 09/03/2015  9:59 AM

## 2015-09-03 NOTE — Progress Notes (Signed)
   09/03/15 1400  Clinical Encounter Type  Visited With Patient and family together  Visit Type Patient actively dying;Critical Care;Initial;Spiritual support;Psychological support  Referral From Family  Spiritual Encounters  Spiritual Needs Emotional;Prayer;Grief support  [place] asked by family to offer prayer and provided grief, emotional and spiritual support to family at bedside of pt. 2:25 PM Gwynn Burly

## 2015-09-03 NOTE — Progress Notes (Signed)
PULMONARY / CRITICAL CARE MEDICINE   Name: [name] MRN: [id number] DOB: Apr 27, 1934    ADMISSION DATE:  09/13/2015 CONSULTATION DATE:  09/01/15  REFERRING MD:  Sheliah Plane Eliseo Squires)   CHIEF COMPLAINT:  Hypoxic respiratory failure   HISTORY OF PRESENT ILLNESS:   81yo female with hx CAD, sCHF (EF 45-50%), HTN, ESRD with recent admission 5/17-6/1 with acute hypoxic respiratory failure with bilateral infiltrates thought r/t ILD v edema.  During that admission she had an extensive workup including CT chest, V/Q scan and TTE. She was started on solumedrol that admission to see if her ground glass opacities might be steroid responsive and discharged on a long taper. She reports minimal if any improvement with steroids. She had a thorough autoimmune workup as well, which was notable only for a RF of 21. CCP was negative.  She returned to [facility] 6/5 with progressive dyspnea.  She was started on HD for progressive CKD but remained completely intolerant of any volume removal.  She was tx to [facility] for ongoing respiratory failure on 6/16.  On 6/19 during HD she was again completely intolerant of even 543m of volume removal with worsening respiratory status despite bipap and PCCM consulted for ICU tx.    SUBJECTIVE:  Hypotensive with 5064mvolume removal.   Increased WOB, ongoing hypoxia.   VITAL SIGNS: BP 71/48 mmHg  Pulse 71  Temp(Src) 98.5 F (36.9 C) (Oral)  Resp 24  Ht 5' 5"  (1.651 m)  Wt 53.5 kg (117 lb 15.1 oz)  BMI 19.63 kg/m2  SpO2 97%  HEMODYNAMICS:    VENTILATOR SETTINGS: Vent Mode:  [-]  FiO2 (%):  [100 %] 100 %  INTAKE / OUTPUT: I/O last 3 completed shifts: In: 740 [P.O.:740] Out: -   PHYSICAL EXAMINATION: General:  Frail, chronically ill appearing female, mild respiratory distress  Neuro: somnolent but arousable, follows commands, nods appropriately, MAE  CV: s1s2 rrr PULM: resps even mildly labored on  Bipap, bibasilar crackles GI: round, soft, non tender  Extremities:   Warm and dry, no edema   LABS:  BMET  Recent Labs Lab 09/02/2015 0548 09/01/15 0624 09/03/15 0722  NA 132* 136 133*  K 4.8 4.8 4.2  CL 97* 98* 97*  CO2 25 28 27   BUN 97* 40* 78*  CREATININE 6.08* 3.62* 4.90*  GLUCOSE 105* 118* 91    Electrolytes  Recent Labs Lab 08/29/15 0548  08/18/2015 0548 09/01/15 0624 09/03/15 0722  CALCIUM 8.2*  < > 8.4* 8.4* 8.2*  PHOS 6.2*  --   --  5.9* 3.6  < > = values in this interval not displayed.  CBC  Recent Labs Lab 08/29/15 0546 09/08/2015 0540 09/03/15 0323  WBC 7.5 11.3* 11.5*  HGB 10.6* 9.7* 9.4*  HCT 32.5* 29.4* 30.3*  PLT 163 201 197    Coag's No results for input(s): APTT, INR in the last 168 hours.  Sepsis Markers No results for input(s): LATICACIDVEN, PROCALCITON, O2SATVEN in the last 168 hours.  ABG  Recent Labs Lab 09/07/2015 1753 09/03/15 0805  PHART 7.507* 7.457*  PCO2ART 36.9 41.3  PO2ART 58.5* 79.2*    Liver Enzymes  Recent Labs Lab 08/29/15 0548 09/01/15 0624 09/03/15 0722  ALBUMIN 2.6* 2.4* 2.4*    Cardiac Enzymes No results for input(s): TROPONINI, PROBNP in the last 168 hours.  Glucose No results for input(s): GLUCAP in the last 168 hours.  Imaging No results found.   STUDIES:  5/23 V/Q > low prob PE 5/June 22, 2024HC > mild  PAH (likely group 3), with PRV 2.9 normal PCWP and CO, resting hypoxemia. No role for pulmonary vasodilators.  5/24 CT Chest > Markedly abnormal pulmonary parenchyma which geographic areas of septal thickening and ground-glass opacity. Small amount of left pleural fluid with dependent atelectasis. Small to moderate pericardial effusion. Atherosclerosis of the aorta and the coronary arteries. 1 cm right paratracheal lymph node just beneath the thoracic inlet. There is not an extensive pattern of lymphadenopathy.  Autoimmune:  5/25  ESR >> 122 CRP >> 34.1  RF >> 21.3 elevated ANA >> no results seen ANCA >> (-) ACE >> 22 Eosinophils >> 0.3  5/31 Jo1 >> <0.2 ANA  >> (-) SCL70 >> <0.2 Sm >> <0.2 HIV >> (-) CCP >> (4)  CULTURES: None  ANTIBIOTICS: None  SIGNIFICANT EVENTS: 6/19>> worsening respiratory failure, intubated  6/19 FOB>>>  LINES/TUBES: R [place] perm cath (pta)>>> ETT 6/19>>>  DISCUSSION: Mrs. Searcy is a 53F with progressive hypoxemic respiratory failure. Initial workup was notable for rhino/entero virus and resultant ARDS (now fibrotic stage) is certainly a possibility. Her CXR has shown marked progression with increasing involvement of the bases. It is difficult to know if there is a component of volume overload or this is merely progression of her underlying pathology. Interestingly, her initial CT showed more apical disease. She clearly does not fit a UIP pattern. It does not appear that she is steroid responsive and has not tolerated volume removal.  Now intubated, s/p FOB.   ASSESSMENT / PLAN:  PULMONARY A: Acute hypoxemic respiratory failure, likely multifactorial and related to underlying ILD vs volume overload vs sequelae of ARDS P:   Vent support - 8cc/kg  F/u CXR  F/u ABG FOB -- cell count with diff,  BAL  Keep dry as BP tol  Stop  empiric steroids for now  No empiric abx for now  BD's  Check ESR   CARDIOVASCULAR A:  Hx CAD Hx abdominal aortic aneurysm repair HTN HLD Hypotension - with attempts at volume removal  P:  Consider f/u echo -- hold for now, most recent echo 08/02/15  RENAL A:   ESRD on HD Hyponatremia  P:   Renal following  HD per renal  Continue attempts at gentle volume removal   GASTROINTESTINAL No active issue  P:   pepcid   NPO  TF   HEMATOLOGIC Anemia - mild  P:  F/u CBC  SQ heparin   INFECTIOUS No active issue  P:   Monitor wbc, fever curve off abx  BAL   ENDOCRINE Hypothyroid  P:   Cont synthroid   NEUROLOGIC Sedation needs on vent  P:   RASS goal: -1 Fentanyl gtt  PRN versed    FAMILY  - Updates:  Daughter updated at bedside 6/19  - Inter-disciplinary  family meet or Palliative Care meeting due by:  6/26    Nickolas Madrid, NP 09/03/2015  9:24 AM Pager: [phone] or [phone]  STAFF NOTE: Linwood Dibbles, MD FACP have personally reviewed patient's available data, including medical history, events of note, physical examination and test results as part of my evaluation. I have discussed with resident/NP and other care providers such as pharmacist, RN and RRT. In addition, I personally evaluated patient and elicited key findings of: lethargic in HD, distress severe, shallow breathing, coarse BS apical predominant, borderline BP, unable to pull volume, more concerned for primary int lung process, I have reviewed in full all films, CT chest in past, she requires emergent intubation,  then would bronch BAL diagnostic, not impressed infection, has not responded well to steroids in past nor blood work pos for autoimmune, RF noted, can repeat that and esr, dc steroids, send cell count and diff, culture, 8 cc/kg rate 18, 100%, needs peep , repeat abg on vent, can feed today, would get cvp assess volume status as borderline BP, add neo if needed for BP support, echo last reviewed, i updated daughter at bedside in full, may need cvvhd per renal, no sig k or acidosis issues as of now and just finished treatment, chem in am, okay to bolus back for now, stat pcxr post intubation and line, i updated daughter in full The patient is critically ill with multiple organ systems failure and requires high complexity decision making for assessment and support, frequent evaluation and titration of therapies, application of advanced monitoring technologies and extensive interpretation of multiple databases.   Critical Care Time devoted to patient care services described in this note is 45 Minutes. This time reflects time of care of this signee: Merrie Roof, MD FACP. This critical care time does not reflect procedure time, or teaching time or supervisory time  of PA/NP/Med student/Med Resident etc but could involve care discussion time. Rest per NP/medical resident whose note is outlined above and that I agree with   [name]. Titus Mould, MD, [place] Pgr: [facility] 09/03/2015 9:33 AM

## 2015-09-03 NOTE — Progress Notes (Signed)
Cor-trac RN unable to place NG tube. [employer] called and message left with secretary to pass along to MD.

## 2015-09-04 ENCOUNTER — Inpatient Hospital Stay (HOSPITAL_COMMUNITY): Payer: Medicare HMO

## 2015-09-04 DIAGNOSIS — J9622 Acute and chronic respiratory failure with hypercapnia: Secondary | ICD-10-CM

## 2015-09-04 DIAGNOSIS — Z452 Encounter for adjustment and management of vascular access device: Secondary | ICD-10-CM | POA: Insufficient documentation

## 2015-09-04 DIAGNOSIS — J939 Pneumothorax, unspecified: Secondary | ICD-10-CM | POA: Insufficient documentation

## 2015-09-04 DIAGNOSIS — J9621 Acute and chronic respiratory failure with hypoxia: Secondary | ICD-10-CM

## 2015-09-04 DIAGNOSIS — J9602 Acute respiratory failure with hypercapnia: Secondary | ICD-10-CM

## 2015-09-04 LAB — PNEUMOCYSTIS JIROVECI SMEAR BY DFA
PNEUMOCYSTIS JIROVECI AG: NEGATIVE
PNEUMOCYSTIS JIROVECI AG: NEGATIVE

## 2015-09-04 LAB — GLUCOSE, CAPILLARY
GLUCOSE-CAPILLARY: 96 mg/dL (ref 65–99)
GLUCOSE-CAPILLARY: 87 mg/dL (ref 65–99)
GLUCOSE-CAPILLARY: 123 mg/dL — AB (ref 65–99)
Glucose-Capillary: 94 mg/dL (ref 65–99)
Glucose-Capillary: 86 mg/dL (ref 65–99)
Glucose-Capillary: 84 mg/dL (ref 65–99)

## 2015-09-04 LAB — RENAL FUNCTION PANEL
ANION GAP: 10 (ref 5–15)
Albumin: 2.5 g/dL — ABNORMAL LOW (ref 3.5–5.0)
BUN: 64 mg/dL — ABNORMAL HIGH (ref 6–20)
CHLORIDE: 97 mmol/L — AB (ref 101–111)
CO2: 27 mmol/L (ref 22–32)
Calcium: 8.1 mg/dL — ABNORMAL LOW (ref 8.9–10.3)
Creatinine, Ser: 5.11 mg/dL — ABNORMAL HIGH (ref 0.44–1.00)
GFR, EST AFRICAN AMERICAN: 8 mL/min — AB (ref >60)
GFR, EST NON AFRICAN AMERICAN: 7 mL/min — AB (ref >60)
Glucose, Bld: 94 mg/dL (ref 65–99)
POTASSIUM: 5.1 mmol/L (ref 3.5–5.1)
Phosphorus: 6 mg/dL — ABNORMAL HIGH (ref 2.5–4.6)
Sodium: 134 mmol/L — ABNORMAL LOW (ref 135–145)

## 2015-09-04 LAB — POCT I-STAT 3, ART BLOOD GAS (G3+)
ACID-BASE DEFICIT: 2 mmol/L (ref 0.0–2.0)
ACID-BASE DEFICIT: 4 mmol/L — AB (ref 0.0–2.0)
BICARBONATE: 23.2 meq/L (ref 20.0–24.0)
Bicarbonate: 24.7 mEq/L — ABNORMAL HIGH (ref 20.0–24.0)
O2 SAT: 98 %
O2 Saturation: 94 %
PH ART: 7.281 — AB (ref 7.350–7.450)
PH ART: 7.308 — AB (ref 7.350–7.450)
PO2 ART: 125 mmHg — AB (ref 80.0–100.0)
Patient temperature: 98.6
TCO2: 25 mmol/L (ref 0–100)
TCO2: 26 mmol/L (ref 0–100)
pCO2 arterial: 49.3 mmHg — ABNORMAL HIGH (ref 35.0–45.0)
pCO2 arterial: 49.7 mmHg — ABNORMAL HIGH (ref 35.0–45.0)
pO2, Arterial: 83 mmHg (ref 80.0–100.0)

## 2015-09-04 LAB — RESPIRATORY PANEL BY PCR
Adenovirus: NOT DETECTED
Adenovirus: NOT DETECTED
BORDETELLA PERTUSSIS-RVPCR: NOT DETECTED
Bordetella pertussis: NOT DETECTED
CORONAVIRUS HKU1-RVPPCR: NOT DETECTED
CORONAVIRUS NL63-RVPPCR: NOT DETECTED
CORONAVIRUS OC43-RVPPCR: NOT DETECTED
Chlamydophila pneumoniae: NOT DETECTED
Chlamydophila pneumoniae: NOT DETECTED
Coronavirus 229E: NOT DETECTED
Coronavirus 229E: NOT DETECTED
Coronavirus HKU1: NOT DETECTED
Coronavirus NL63: NOT DETECTED
Coronavirus OC43: NOT DETECTED
INFLUENZA A H1 2009-RVPPR: NOT DETECTED
INFLUENZA A H3-RVPPCR: NOT DETECTED
INFLUENZA A-RVPPCR: NOT DETECTED
INFLUENZA B-RVPPCR: NOT DETECTED
Influenza A H1 2009: NOT DETECTED
Influenza A H1: NOT DETECTED
Influenza A H1: NOT DETECTED
Influenza A H3: NOT DETECTED
Influenza A: NOT DETECTED
Influenza B: NOT DETECTED
METAPNEUMOVIRUS-RVPPCR: NOT DETECTED
MYCOPLASMA PNEUMONIAE-RVPPCR: NOT DETECTED
Metapneumovirus: NOT DETECTED
Mycoplasma pneumoniae: NOT DETECTED
PARAINFLUENZA VIRUS 1-RVPPCR: NOT DETECTED
PARAINFLUENZA VIRUS 2-RVPPCR: NOT DETECTED
PARAINFLUENZA VIRUS 3-RVPPCR: NOT DETECTED
PARAINFLUENZA VIRUS 3-RVPPCR: NOT DETECTED
PARAINFLUENZA VIRUS 4-RVPPCR: NOT DETECTED
Parainfluenza Virus 1: NOT DETECTED
Parainfluenza Virus 2: NOT DETECTED
Parainfluenza Virus 4: NOT DETECTED
RESPIRATORY SYNCYTIAL VIRUS-RVPPCR: NOT DETECTED
RHINOVIRUS / ENTEROVIRUS - RVPPCR: NOT DETECTED
Respiratory Syncytial Virus: NOT DETECTED
Rhinovirus / Enterovirus: NOT DETECTED

## 2015-09-04 LAB — CBC
HCT: 32.9 % — ABNORMAL LOW (ref 36.0–46.0)
HEMOGLOBIN: 10 g/dL — AB (ref 12.0–15.0)
MCH: 29.7 pg (ref 26.0–34.0)
MCHC: 30.4 g/dL (ref 30.0–36.0)
MCV: 97.6 fL (ref 78.0–100.0)
PLATELETS: 173 10*3/uL (ref 150–400)
RBC: 3.37 MIL/uL — AB (ref 3.87–5.11)
RDW: 17.4 % — ABNORMAL HIGH (ref 11.5–15.5)
WBC: 17.3 10*3/uL — AB (ref 4.0–10.5)

## 2015-09-04 LAB — PATHOLOGIST SMEAR REVIEW

## 2015-09-04 LAB — MAGNESIUM
Magnesium: 2.1 mg/dL (ref 1.7–2.4)
Magnesium: 2.2 mg/dL (ref 1.7–2.4)

## 2015-09-04 LAB — RHEUMATOID FACTOR: Rhuematoid fact SerPl-aCnc: 21.3 IU/mL — ABNORMAL HIGH (ref 0.0–13.9)

## 2015-09-04 MED ORDER — LIDOCAINE VISCOUS 2 % MT SOLN
15.0000 mL | Freq: Once | OROMUCOSAL | Status: AC
  Administered 2015-09-04: 3 mL via OROMUCOSAL
  Filled 2015-09-04: qty 15

## 2015-09-04 MED ORDER — DIATRIZOATE MEGLUMINE & SODIUM 66-10 % PO SOLN
30.0000 mL | Freq: Once | ORAL | Status: AC
  Administered 2015-09-04: 15 mL via ORAL
  Filled 2015-09-04: qty 30

## 2015-09-04 MED ORDER — METHYLPREDNISOLONE SODIUM SUCC 40 MG IJ SOLR
40.0000 mg | Freq: Three times a day (TID) | INTRAMUSCULAR | Status: DC
  Administered 2015-09-04 – 2015-09-05 (×2): 40 mg via INTRAVENOUS
  Filled 2015-09-04 (×2): qty 1

## 2015-09-04 MED ORDER — LIDOCAINE VISCOUS 2 % MT SOLN
OROMUCOSAL | Status: AC
  Administered 2015-09-04: 3 mL via OROMUCOSAL
  Filled 2015-09-04: qty 15

## 2015-09-04 MED ORDER — DIATRIZOATE MEGLUMINE & SODIUM 66-10 % PO SOLN
ORAL | Status: AC
  Administered 2015-09-04: 15 mL via ORAL
  Filled 2015-09-04: qty 30

## 2015-09-04 MED ORDER — DARBEPOETIN ALFA 100 MCG/0.5ML IJ SOSY
100.0000 ug | PREFILLED_SYRINGE | INTRAMUSCULAR | Status: DC
  Filled 2015-09-04: qty 0.5

## 2015-09-04 NOTE — Progress Notes (Signed)
PULMONARY / CRITICAL CARE MEDICINE   Name: [name] MRN: [id number] DOB: 10-13-1934    ADMISSION DATE:  08/24/2015 CONSULTATION DATE:  09/01/15  REFERRING MD:  Sheliah Plane Eliseo Squires)   CHIEF COMPLAINT:  Hypoxic respiratory failure   HISTORY OF PRESENT ILLNESS:   81yo female with hx CAD, sCHF (EF 45-50%), HTN, ESRD with recent admission 5/17-6/1 with acute hypoxic respiratory failure with bilateral infiltrates thought r/t ILD v edema.  During that admission she had an extensive workup including CT chest, V/Q scan and TTE. She was started on solumedrol that admission to see if her ground glass opacities might be steroid responsive and discharged on a long taper. She reports minimal if any improvement with steroids. She had a thorough autoimmune workup as well, which was notable only for a RF of 21. CCP was negative.  She returned to [facility] 6/5 with progressive dyspnea.  She was started on HD for progressive CKD but remained completely intolerant of any volume removal.  She was tx to [facility] for ongoing respiratory failure on 6/16.  On 6/19 during HD she was again completely intolerant of even 573m of volume removal with worsening respiratory status despite bipap and PCCM consulted for ICU tx.    SUBJECTIVE:  Developed bilateral pneumothoraces while on PEEP 10.   VITAL SIGNS: BP 87/56 mmHg  Pulse 103  Temp(Src) 97.7 F (36.5 C) (Oral)  Resp 18  Ht 5' 3"  (1.6 m)  Wt 52.4 kg (115 lb 8.3 oz)  BMI 20.47 kg/m2  SpO2 94%  HEMODYNAMICS: CVP:  [4 mmHg-9 mmHg] 9 mmHg  VENTILATOR SETTINGS: Vent Mode:  [-] PRVC FiO2 (%):  [55 %-60 %] 60 % Set Rate:  [18 bmp-21 bmp] 21 bmp Vt Set:  [370 mL-420 mL] 370 mL PEEP:  [10 cmH20] 10 cmH20 Plateau Pressure:  [24 cmH20-32 cmH20] 24 cmH20  INTAKE / OUTPUT: I/O last 3 completed shifts: In: 629 [I.V.:499; Other:20; NG/GT:60; IV Piggyback:50] Out: 8539[Urine:732; Other:82]  PHYSICAL EXAMINATION: General:  Sedated on vent Neuro: sedated, grimace to pain.  RASS -2.  CV: s1s2 rrr PULM:  Coarse bilateral, diminished R GI: round, soft, non tender  Extremities:  Warm and dry, no edema   LABS:  BMET  Recent Labs Lab 09/01/15 0624 09/03/15 0722 09/04/15 0450  NA 136 133* 134*  K 4.8 4.2 5.1  CL 98* 97* 97*  CO2 28 27 27   BUN 40* 78* 64*  CREATININE 3.62* 4.90* 5.11*  GLUCOSE 118* 91 94    Electrolytes  Recent Labs Lab 09/01/15 0624 09/03/15 0722  09/03/15 1314 09/03/15 2022 09/04/15 0450 09/04/15 1650  CALCIUM 8.4* 8.2*  --   --   --  8.1*  --   MG  --   --   < > 2.2 2.2 2.1 2.2  PHOS 5.9* 3.6  --  5.9* 6.0* 6.0*  --   < > = values in this interval not displayed.  CBC  Recent Labs Lab 08/26/2015 0540 09/03/15 0323 09/04/15 0451  WBC 11.3* 11.5* 17.3*  HGB 9.7* 9.4* 10.0*  HCT 29.4* 30.3* 32.9*  PLT 201 197 173    Coag's No results for input(s): APTT, INR in the last 168 hours.  Sepsis Markers No results for input(s): LATICACIDVEN, PROCALCITON, O2SATVEN in the last 168 hours.  ABG  Recent Labs Lab 09/03/15 0805 09/03/15 1127 09/03/15 1233  PHART 7.457* 7.354 7.344*  PCO2ART 41.3 53.5* 52.6*  PO2ART 79.2* 173.0* 48.0*    Liver Enzymes  Recent Labs  Lab 09/01/15 0624 09/03/15 0722 09/04/15 0450  ALBUMIN 2.4* 2.4* 2.5*    Cardiac Enzymes No results for input(s): TROPONINI, PROBNP in the last 168 hours.  Glucose  Recent Labs Lab 09/03/15 2026 09/04/15 0111 09/04/15 0431 09/04/15 0727 09/04/15 1129 09/04/15 1538  GLUCAP 100* 96 94 87 86 84    Imaging Dg Abd 1 View  09/04/2015  CLINICAL DATA:  Feeding tube placement. EXAM: ABDOMEN - 1 VIEW COMPARISON:  Abdominal radiograph 09/03/2015 FINDINGS: The feeding tube tip is in the fourth portion the duodenum near the duodenal-jejunal junction. IMPRESSION: Feeding tube placement with tip in the fourth portion of the duodenum. Electronically Signed   By: Marijo Sanes M.D.   On: 09/04/2015 15:18   Ct Chest Wo Contrast  09/04/2015  CLINICAL  DATA:  Acute hypoxic respiratory failure. EXAM: CT CHEST WITHOUT CONTRAST TECHNIQUE: Multidetector CT imaging of the chest was performed following the standard protocol without IV contrast. COMPARISON:  Chest CT 08/08/2015 and chest x-ray 09/04/2015 FINDINGS: Mediastinum/Nodes: Right IJ center venous catheter is noted along with a smaller caliber left IJ catheter. They are in good position without complicating features. The endotracheal tube is also in good position and there is an NG tube coursing down the esophagus and into the stomach. Fairly extensive air tracking up into the neck from the chest due to bilateral pneumothoraces and pneumomediastinum. The heart is normal in size. No pericardial effusion. There is tortuosity, ectasia and calcification of the thoracic aorta. Dense 3 vessel coronary artery calcifications are noted. No mediastinal or hilar mass or adenopathy. Lungs/Pleura: There is a moderate-sized right pneumothorax estimated at 30-40%. There is also a small left-sided pneumothorax estimated at 10%. Extensive lung disease bilaterally with interstitial and airspace components. Upper abdomen: No significant upper abdominal findings. Advanced atherosclerotic calcifications, tortuosity and ectasia of the abdominal aorta. Branch vessel calcifications are also noted. The NG tube is in the duodenum. Musculoskeletal: No significant bony findings. IMPRESSION: 1. Approximately a 40% right-sided pneumothorax. 2. 10% left-sided pneumothorax. 3. Pneumomediastinum with air dissecting up into the neck. 4. Significant underlying lung disease with interstitial and airspace components. 5. Support apparatus in good position without complicating features. These results were called by telephone at the time of interpretation on 09/04/2015 at 7:28 pm to Dr. Corinna Lines, who verbally acknowledged these results. Electronically Signed   By: Marijo Sanes M.D.   On: 09/04/2015 19:28   Portable Chest Xray  09/04/2015  CLINICAL  DATA:  Respiratory failure. EXAM: PORTABLE CHEST 1 VIEW COMPARISON:  09/03/2015. FINDINGS: Patient is rotated to the right. Endotracheal tube, left IJ line, right IJ dual-lumen catheter stable position. Cardiomegaly with diffuse bilateral pulmonary and infiltrates suggesting congestive heart failure. Bilateral pneumonia cannot be excluded . No prominent pleural effusion or pneumothorax . IMPRESSION: 1. Lines and tubes in stable position. 2. Cardiomegaly with diffuse bilateral pulmonary infiltrates consistent with congestive heart failure pulmonary edema. Bilateral pneumonia cannot be excluded. Electronically Signed   By: Marcello Moores  Register   On: 09/04/2015 07:12   Dg Chest Port 1 View  09/03/2015  CLINICAL DATA:  81 year old female with central line placement EXAM: PORTABLE CHEST 1 VIEW COMPARISON:  Chest radiograph dated 09/03/2015 FINDINGS: Endotracheal tube, right sided dialysis catheter appear in stable positioning. The left IJ central line with tip over central SVC. The tip of the spine is now turned caudate towards the heart and in appropriate positioning. A central line is noted over the right side of the neck extending of are with tip beyond the  upper margin of the image. As per my conversation with the patient's nurse, patient does not have a right IJ line. This line is therefore appears to be superimposed on the patient. Stable bilateral pleural parenchymal density primarily involving the mid to lower lung field. No pleural effusion or pneumothorax. Stable cardiac silhouette. No acute osseous pathology. IMPRESSION: The tip of the left IJ central line has turned downward towards the heart and located over the central SVC. The remainder of the lines and tubes are in stable positioning. Stable bilateral parenchymal densities. Electronically Signed   By: Anner Crete M.D.   On: 09/03/2015 23:24   Dg Addison Bailey G Tube Plc W/fl-no Rad  09/04/2015  CLINICAL DATA:  NASO G TUBE PLACEMENT WITH FLUORO Fluoroscopy  was utilized by the requesting physician.  No radiographic interpretation.     STUDIES:  5/23 V/Q > low prob PE 08-26-22 RHC > mild PAH (likely group 3), with PRV 2.9 normal PCWP and CO, resting hypoxemia. No role for pulmonary vasodilators.  08/26/22 CT Chest > Markedly abnormal pulmonary parenchyma which geographic areas of septal thickening and ground-glass opacity. Small amount of left pleural fluid with dependent atelectasis. Small to moderate pericardial effusion. Atherosclerosis of the aorta and the coronary arteries. 1 cm right paratracheal lymph node just beneath the thoracic inlet. There is not an extensive pattern of lymphadenopathy. 6/20 CT chest > Approximately a 40% right-sided pneumothorax.  10% left-sided pneumothorax. Pneumomediastinum with air dissecting up into the neck. Significant underlying lung disease with interstitial and airspace components.  Autoimmune:  5/25  ESR >> 122>>>6/19- 34 CRP >> 34.1  RF >> 21.3 elevated, repeat same 6/19 ANA >> no results seen ANCA >> (-) ACE >> 22 Eosinophils >> 0.3  5/31 Jo1 >> <0.2 ANA >> (-) SCL70 >> <0.2 Sm >> <0.2 HIV >> (-) CCP >> (4)  CULTURES: 6/19 Bronch BAL  Pcp>>>neg  Culture>>>  RVP PCR BAL 6/19 > neg  ANTIBIOTICS:  SIGNIFICANT EVENTS: 6/19>> worsening respiratory failure, intubated  6/19 FOB, ett  LINES/TUBES: R  perm cath (pta)>>> ETT 6/19>>> R Chest tune 6/20 >>> L chest tube 6/20 >>>  DISCUSSION: Mrs. Real is a 32F with progressive hypoxemic respiratory failure. Initial workup was notable for rhino/entero virus and resultant ARDS (now fibrotic stage) is certainly a possibility. Her CXR has shown marked progression with increasing involvement of the bases. It is difficult to know if there is a component of volume overload or this is merely progression of her underlying pathology. Interestingly, her initial CT showed more apical disease. She clearly does not fit a UIP pattern. It does not appear that  she is steroid responsive and has not tolerated volume removal.  She was intubated and required high PEEP ventilation under ARDS protocol. She developed bilateral pneumothoraces 6/20 requiring bilateral chest tubes.   ASSESSMENT / PLAN:  PULMONARY A: Acute hypoxemic respiratory failure ILD unclear etiology ARDS Bilateral PTX P:   Keep plat less 30 , apply ards protocol >> however now keep PEEP 5 and wean FiO2 to keep SpO2 > 88% Place bilateral chest tubes Chest tubes to 20cmH2O suction. CXR to confirm CT placement and in AM No IV steroids at this time, may need repeat challenge, esr unimpressive but did reduce after  steroids use  Open lung ? Even balance goals If able would wean 60% cpap 8, not 10 , if able  CARDIOVASCULAR A:  Hx CAD Hx abdominal aortic aneurysm repair HTN HLD Hypotension - with attempts at volume removal >  no longer requiring pressors P:  Telemetry monitoring Goal euvolemia Continued CVP monitoring  RENAL A:   ESRD P:   Renal following  HD per renal, likely in am  kvo  GASTROINTESTINAL HH, obsturction P:   Pepcid for SUP   TF to goal once access obtained  HEMATOLOGIC Anemia - mild  P:  F/u CBC am SQ heparin   INFECTIOUS Low suspicion infection  P:   Monitor wbc, fever curve off abx  Follow BAL   ENDOCRINE Hypothyroid  P:   Cont synthroid   NEUROLOGIC Sedation needs on vent  P:   RASS goal: -1 to -2 Fentanyl gtt  PRN versed  WUA  FAMILY  - Updates:  Daughter updated at bedside 6/19, 6/20 by DF  - Inter-disciplinary family meet or Palliative Care meeting due by:  6/26  Georgann Housekeeper, AGACNP-BC [place] Pulmonology/Critical Care Pager [phone] or [phone]  09/04/2015 8:59 PM

## 2015-09-04 NOTE — Progress Notes (Signed)
eLink Physician-Brief Progress Note Patient Name: [name] DOB: Jul 18, 1934 MRN: [id number]   Date of Service  09/04/2015  HPI/Events of Note  Bilateral Pneumothorax - Called by radiology who relates chest CT findings of 40% R pneumothorax and 10% L pneumothorax. Patient is on mechanical ventilation.  eICU Interventions   These findings need to be addressed ASAP. On call PCCM physician, Dr. Elsworth Soho, notified and he is on his way to see the patient.     Intervention Category Major Interventions: Other:  [name] 09/04/2015, 7:35 PM

## 2015-09-04 NOTE — Care Management Important Message (Signed)
Important Message  Patient Details  Name: [name] MRN: [id number] Date of Birth: January 27, 1935   Medicare Important Message Given:  Yes    Loann Quill 09/04/2015, 8:19 AM

## 2015-09-04 NOTE — Progress Notes (Signed)
[facility] Progress Note   Subjective/Interval Events  Transferred to ICU after hypotension/acute resp event in HD Intubated/bronched Central line placed - CVP 5 Foley placed (700 in bladder)   Filed Vitals:   09/04/15 0729 09/04/15 0800 09/04/15 0810 09/04/15 0814  BP: 98/60 105/57    Pulse: 95 93 95   Temp: 98.7 F (37.1 C)     TempSrc: Oral     Resp: _0 Height:      Weight:      SpO2: 96% 96% 98% 94%   Physical Exam: Intubated, sedated No JVD discernable New L IJ line Anteriorly fairly clear Rhythm regular S1S2 No S3 Abdomen non-distended, non-tender No peripheral edema R IJ [facility]    Inpatient medications: . antiseptic oral rinse  7 mL Mouth Rinse 10 times per day  . aspirin EC  81 mg Oral Daily  . budesonide  0.5 mg Nebulization BID  . calcitRIOL  0.25 mcg Oral Daily  . calcium acetate  2,001 mg Oral TID WC  . chlorhexidine gluconate (SAGE KIT)  15 mL Mouth Rinse BID  . citalopram  20 mg Oral Daily  . [START ON 09/22/2015] cyanocobalamin  1,000 mcg Intramuscular Q30 days  . darbepoetin (ARANESP) injection - DIALYSIS  100 mcg Intravenous Q Mon-HD  . famotidine (PEPCID) IV  20 mg Intravenous Q24H  . feeding supplement (PRO-STAT SUGAR FREE 64)  30 mL Per Tube TID  . feeding supplement (VITAL AF 1.2 CAL)  1,000 mL Per Tube Q24H  . ferric gluconate (FERRLECIT/NULECIT) IV  125 mg Intravenous Q M,W,F-HD  . heparin  5,000 Units Subcutaneous Q8H  . ipratropium-albuterol  3 mL Nebulization Q6H  . levothyroxine  50 mcg Oral QAC breakfast  . senna-docusate  1 tablet Oral Daily   . fentaNYL infusion INTRAVENOUS 100 mcg/hr (09/03/15 2019)   acetaminophen **OR** acetaminophen, bisacodyl, fentaNYL, midazolam, nitroGLYCERIN   Recent Labs Lab 09/01/15 0624 09/03/15 0722 09/03/15 1314 09/03/15 2022 09/04/15 0450  NA 136 133*  --   --  134*  K 4.8 4.2  --   --  5.1  CL 98* 97*  --   --  97*  CO2 28 27  --   --  27  GLUCOSE 118* 91  --   --   94  BUN 40* 78*  --   --  64*  CREATININE 3.62* 4.90*  --   --  5.11*  CALCIUM 8.4* 8.2*  --   --  8.1*  PHOS 5.9* 3.6 5.9* 6.0* 6.0*    Recent Labs Lab 09/01/15 0624 09/03/15 0722 09/04/15 0450  ALBUMIN 2.4* 2.4* 2.5*    Recent Labs Lab 09/04/2015 0540 09/03/15 0323 09/04/15 0451  WBC 11.3* 11.5* 17.3*  HGB 9.7* 9.4* 10.0*  HCT 29.4* 30.3* 32.9*  MCV 95.5 94.1 97.6  PLT 201 197 173   Iron/TIBC/Ferritin/ %Sat    Component Value Date/Time   IRON 40 08/22/2015 0437   TIBC 190* 08/22/2015 0437   FERRITIN 646* 08/22/2015 0437   IRONPCTSAT 21 08/22/2015 0437  Portable Chest Xray  09/04/2015  CLINICAL DATA:  Respiratory failure. EXAM: PORTABLE CHEST 1 VIEW COMPARISON:  09/03/2015. FINDINGS: Patient is rotated to the right. Endotracheal tube, left IJ line, right IJ dual-lumen catheter stable position. Cardiomegaly with diffuse bilateral pulmonary and infiltrates suggesting congestive heart failure. Bilateral pneumonia cannot be excluded . No prominent pleural effusion or pneumothorax . IMPRESSION: 1. Lines and tubes in stable position. 2. Cardiomegaly with diffuse bilateral  pulmonary infiltrates consistent with congestive heart failure pulmonary edema. Bilateral pneumonia cannot be excluded. Electronically Signed   By: Marcello Moores  Register   On: 09/04/2015 07:12   Dg Chest Port 1 View  09/03/2015  CLINICAL DATA:  81 year old female with central line placement EXAM: PORTABLE CHEST 1 VIEW COMPARISON:  Chest radiograph dated 09/03/2015 FINDINGS: Endotracheal tube, right sided dialysis catheter appear in stable positioning. The left IJ central line with tip over central SVC. The tip of the spine is now turned caudate towards the heart and in appropriate positioning. A central line is noted over the right side of the neck extending of are with tip beyond the upper margin of the image. As per my conversation with the patient's nurse, patient does not have a right IJ line. This line is therefore  appears to be superimposed on the patient. Stable bilateral pleural parenchymal density primarily involving the mid to lower lung field. No pleural effusion or pneumothorax. Stable cardiac silhouette. No acute osseous pathology. IMPRESSION: The tip of the left IJ central line has turned downward towards the heart and located over the central SVC. The remainder of the lines and tubes are in stable positioning. Stable bilateral parenchymal densities. Electronically Signed   By: Anner Crete M.D.   On: 09/03/2015 23:24   Dg Chest Portable 1 View  09/03/2015  CLINICAL DATA:  81 year old female with a history of endotracheal tube placement. Central line placement EXAM: PORTABLE CHEST 1 VIEW COMPARISON:  09/02/2015 FINDINGS: Cardiomediastinal silhouette unchanged in size and contour. Heart borders partially obscured by overlying lung and pleural disease. The patient has been intubated in the interval. Endotracheal tube terminates approximately 2.7 cm above the carina. Unchanged position of right IJ approach hemodialysis catheter, with the tip terminating in the superior atrium. Interval placement of left IJ central venous catheter, terminating at the confluence of the brachycephalic vein and superior vena cava. The tip of the catheter may and tear the azygos vein. Low lung volumes with bilateral interstitial opacities. Interlobular septal thickening. No pneumothorax. IMPRESSION: Interval intubation of the patient. The endotracheal tube terminates approximately 2.7 cm above the carina. Interval placement of left IJ approach central venous catheter, with the tip appearing to terminate at the confluence of the superior vena cava and brachiocephalic vein. The tip could enter the azygos vein. Slight withdrawal of 2-3 cm may be useful. No pneumothorax. Evidence of bilateral interstitial edema. Unchanged right IJ approach hemodialysis catheter. These results were called by telephone at the time of interpretation on  09/03/2015 at 10:35 am to the nurse, Ms Willene Hatchet who verbally acknowledged these results. Signed, Dulcy Fanny. Earleen Newport, DO [facility] Electronically Signed   By: Corrie Mckusick D.O.   On: 09/03/2015 10:37   Dg Abd Portable 1v  09/03/2015  CLINICAL DATA:  81 year old female enteric tube placement. Initial encounter. EXAM: PORTABLE ABDOMEN - 1 VIEW COMPARISON:  CT Abdomen and Pelvis 02/13/2015. Chest radiographs 1008 hours today reported separately. FINDINGS: Portable AP supine view at 1327 hours. Enteric tube is in place. This patient had a moderate-sized gastric hiatal hernia demonstrated in November with about 1/2 of the stomach above the diaphragm the enteric tube tip is at the level the diaphragm now all, side hole at the level of the intrathoracic stomach. Abdominal aortic bifurcated endograft re- demonstrated. Stable cholecystectomy clips. Visualized bowel gas pattern is non obstructed. Stable patchy opacity at both lung bases. Partially visible thoracic central venous catheters. Stable visible mediastinal contours. IMPRESSION: 1.  Enteric tube terminates within a moderate sized gastric hiatal hernia, side hole at the level of the intra-thoracic stomach. 2. Stable lung bases from the earlier chest radiographs today. 3. Non obstructed bowel gas pattern. Electronically Signed   By: Genevie Ann M.D.   On: 09/03/2015 13:45   CT chest on 08/08/15 > Throughout both lungs, there are geographic areas of septal thickening and ground-glass opacity in a patchy distribution. Some lobules are affected an others are not. Most common cause of this pattern is alveolar proteinosis. Other causes can include pneumocystis pneumonia, bronchoalveolar carcinoma, sarcoid, nonspecific interstitial pneumonia and cryptogenic organizing pneumonia. Adult respiratory distress syndrome and pulmonary hemorrhage can also cause this. CXR 6/16 > poor inspiration, bilat diffuse edema vs  infiltrates, R > L     Assessment: 1. Hypoxemia / diffuse pulm infiltrates - unclear cause, but failed attempts to clear w HD and abx. After decompensation on HD yesterday, transferred to ICU, central line placed, CVP only 5 so NOT a volume issues. Intubated, bronched, fluid with monocytic predominance so not looking infectious either. Dr. Titus Mould evaluating, considering diagnostic and therapeutic options.  2. CKD 5, new ESRD - started HD on 6/8, sp HD x 6 (incomplete Rx yesterday, no strong indications for today. Plan HD tomorrow.   3. Secondary HPT - meds when taking po again 4. LUE BC AVF (04/20/15)- AVF infiltrated and resting, R IJ [facility] cath in place (08/27/2015 Dr. Rosana Hoes [facility]) 5. Anemia Hb 10. Added Aranesp 100 yesterday but was not given. Give with next HD.  6. CAD/hx of stent 7. AAA sp stent-graft repair  Jamal Maes, MD [facility] [phone] Pager 09/04/2015, 9:33 AM

## 2015-09-04 NOTE — Procedures (Signed)
Chest Tube Insertion Procedure Note  Indications:  Clinically significant left  Pneumothorax -10% on ventilator +PEEP  Pre-operative Diagnosis: left Pneumothorax  Post-operative Diagnosis: Left Pneumothorax  Procedure Details  Informed consent was obtained for the procedure, including sedation.  Risks of lung perforation, hemorrhage, arrhythmia, and adverse drug reaction were discussed.   After sterile skin prep, using standard technique, a 14 French tube was placed in the left anterior 6th rib space.  Findings: Gush of air on connecting pleurovac  Estimated Blood Loss:  Minimal         Specimens:  None              Complications:  None; patient tolerated the procedure well.         Disposition: ICU - intubated and critically ill.         Condition: stable  Attending Attestation: I performed the procedure.

## 2015-09-04 NOTE — Progress Notes (Signed)
PULMONARY / CRITICAL CARE MEDICINE   Name: [name] MRN: [id number] DOB: [birth year]    ADMISSION DATE:  08/16/2015 CONSULTATION DATE:  09/01/15  REFERRING MD:  Sheliah Plane Eliseo Squires)   CHIEF COMPLAINT:  Hypoxic respiratory failure   HISTORY OF PRESENT ILLNESS:   81yo female with hx CAD, sCHF (EF 45-50%), HTN, ESRD with recent admission 5/17-6/1 with acute hypoxic respiratory failure with bilateral infiltrates thought r/t ILD v edema.  During that admission she had an extensive workup including CT chest, V/Q scan and TTE. She was started on solumedrol that admission to see if her ground glass opacities might be steroid responsive and discharged on a long taper. She reports minimal if any improvement with steroids. She had a thorough autoimmune workup as well, which was notable only for a RF of 21. CCP was negative.  She returned to [facility] 6/5 with progressive dyspnea.  She was started on HD for progressive CKD but remained completely intolerant of any volume removal.  She was tx to [facility] for ongoing respiratory failure on 6/16.  On 6/19 during HD she was again completely intolerant of even 567m of volume removal with worsening respiratory status despite bipap and PCCM consulted for ICU tx.    SUBJECTIVE:  O2 needs remain at 60% peep 10  VITAL SIGNS: BP 105/57 mmHg  Pulse 95  Temp(Src) 98.7 F (37.1 C) (Oral)  Resp 18  Ht 5' 3"  (1.6 m)  Wt 52.4 kg (115 lb 8.3 oz)  BMI 20.47 kg/m2  SpO2 94%  HEMODYNAMICS: CVP:  [2 mmHg-10 mmHg] 5 mmHg  VENTILATOR SETTINGS: Vent Mode:  [-] PRVC FiO2 (%):  [50 %-100 %] 60 % Set Rate:  [18 bmp] 18 bmp Vt Set:  [420 mL] 420 mL PEEP:  [10 cmH20] 10 cmH20 Plateau Pressure:  [27 cNVB16-60cmH20] 29 cmH20  INTAKE / OUTPUT: I/O last 3 completed shifts: In: 459 [P.O.:60; I.V.:399] Out: 7600[Urine:650; Other:82]  PHYSICAL EXAMINATION: General:  Vent calm Neuro: alert, fc well, rass 0 CV: s1s2 rrr PULM:  Slight coarse anterior GI: round, soft, non tender   Extremities:  Warm and dry, no edema   LABS:  BMET  Recent Labs Lab 09/01/15 0624 09/03/15 0722 09/04/15 0450  NA 136 133* 134*  K 4.8 4.2 5.1  CL 98* 97* 97*  CO2 28 27 27   BUN 40* 78* 64*  CREATININE 3.62* 4.90* 5.11*  GLUCOSE 118* 91 94    Electrolytes  Recent Labs Lab 09/01/15 0624 09/03/15 0722 09/03/15 1314 09/03/15 2022 09/04/15 0450  CALCIUM 8.4* 8.2*  --   --  8.1*  MG  --   --  2.2 2.2 2.1  PHOS 5.9* 3.6 5.9* 6.0* 6.0*    CBC  Recent Labs Lab 08/17/2015 0540 09/03/15 0323 09/04/15 0451  WBC 11.3* 11.5* 17.3*  HGB 9.7* 9.4* 10.0*  HCT 29.4* 30.3* 32.9*  PLT 201 197 173    Coag's No results for input(s): APTT, INR in the last 168 hours.  Sepsis Markers No results for input(s): LATICACIDVEN, PROCALCITON, O2SATVEN in the last 168 hours.  ABG  Recent Labs Lab 09/03/15 0805 09/03/15 1127 09/03/15 1233  PHART 7.457* 7.354 7.344*  PCO2ART 41.3 53.5* 52.6*  PO2ART 79.2* 173.0* 48.0*    Liver Enzymes  Recent Labs Lab 09/01/15 0624 09/03/15 0722 09/04/15 0450  ALBUMIN 2.4* 2.4* 2.5*    Cardiac Enzymes No results for input(s): TROPONINI, PROBNP in the last 168 hours.  Glucose  Recent Labs Lab 09/03/15 1606 09/03/15  2016 09/03/15 2026 09/04/15 0111 09/04/15 0431 09/04/15 0727  GLUCAP 103* 91 100* 96 94 87    Imaging Portable Chest Xray  09/04/2015  CLINICAL DATA:  Respiratory failure. EXAM: PORTABLE CHEST 1 VIEW COMPARISON:  09/03/2015. FINDINGS: Patient is rotated to the right. Endotracheal tube, left IJ line, right IJ dual-lumen catheter stable position. Cardiomegaly with diffuse bilateral pulmonary and infiltrates suggesting congestive heart failure. Bilateral pneumonia cannot be excluded . No prominent pleural effusion or pneumothorax . IMPRESSION: 1. Lines and tubes in stable position. 2. Cardiomegaly with diffuse bilateral pulmonary infiltrates consistent with congestive heart failure pulmonary edema. Bilateral pneumonia  cannot be excluded. Electronically Signed   By: Marcello Moores  Register   On: 09/04/2015 07:12   Dg Chest Port 1 View  09/03/2015  CLINICAL DATA:  81 year old female with central line placement EXAM: PORTABLE CHEST 1 VIEW COMPARISON:  Chest radiograph dated 09/03/2015 FINDINGS: Endotracheal tube, right sided dialysis catheter appear in stable positioning. The left IJ central line with tip over central SVC. The tip of the spine is now turned caudate towards the heart and in appropriate positioning. A central line is noted over the right side of the neck extending of are with tip beyond the upper margin of the image. As per my conversation with the patient's nurse, patient does not have a right IJ line. This line is therefore appears to be superimposed on the patient. Stable bilateral pleural parenchymal density primarily involving the mid to lower lung field. No pleural effusion or pneumothorax. Stable cardiac silhouette. No acute osseous pathology. IMPRESSION: The tip of the left IJ central line has turned downward towards the heart and located over the central SVC. The remainder of the lines and tubes are in stable positioning. Stable bilateral parenchymal densities. Electronically Signed   By: Anner Crete M.D.   On: 09/03/2015 23:24   Dg Chest Portable 1 View  09/03/2015  CLINICAL DATA:  81 year old female with a history of endotracheal tube placement. Central line placement EXAM: PORTABLE CHEST 1 VIEW COMPARISON:  09/02/2015 FINDINGS: Cardiomediastinal silhouette unchanged in size and contour. Heart borders partially obscured by overlying lung and pleural disease. The patient has been intubated in the interval. Endotracheal tube terminates approximately 2.7 cm above the carina. Unchanged position of right IJ approach hemodialysis catheter, with the tip terminating in the superior atrium. Interval placement of left IJ central venous catheter, terminating at the confluence of the brachycephalic vein and  superior vena cava. The tip of the catheter may and tear the azygos vein. Low lung volumes with bilateral interstitial opacities. Interlobular septal thickening. No pneumothorax. IMPRESSION: Interval intubation of the patient. The endotracheal tube terminates approximately 2.7 cm above the carina. Interval placement of left IJ approach central venous catheter, with the tip appearing to terminate at the confluence of the superior vena cava and brachiocephalic vein. The tip could enter the azygos vein. Slight withdrawal of 2-3 cm may be useful. No pneumothorax. Evidence of bilateral interstitial edema. Unchanged right IJ approach hemodialysis catheter. These results were called by telephone at the time of interpretation on 09/03/2015 at 10:35 am to the nurse, Ms Willene Hatchet who verbally acknowledged these results. Signed, Dulcy Fanny. Earleen Newport, DO [facility] Electronically Signed   By: Corrie Mckusick D.O.   On: 09/03/2015 10:37   Dg Abd Portable 1v  09/03/2015  CLINICAL DATA:  81 year old female enteric tube placement. Initial encounter. EXAM: PORTABLE ABDOMEN - 1 VIEW COMPARISON:  CT Abdomen and Pelvis 02/13/2015. Chest  radiographs 1008 hours today reported separately. FINDINGS: Portable AP supine view at 1327 hours. Enteric tube is in place. This patient had a moderate-sized gastric hiatal hernia demonstrated in November with about 1/2 of the stomach above the diaphragm the enteric tube tip is at the level the diaphragm now all, side hole at the level of the intrathoracic stomach. Abdominal aortic bifurcated endograft re- demonstrated. Stable cholecystectomy clips. Visualized bowel gas pattern is non obstructed. Stable patchy opacity at both lung bases. Partially visible thoracic central venous catheters. Stable visible mediastinal contours. IMPRESSION: 1. Enteric tube terminates within a moderate sized gastric hiatal hernia, side hole at the level of the  intra-thoracic stomach. 2. Stable lung bases from the earlier chest radiographs today. 3. Non obstructed bowel gas pattern. Electronically Signed   By: Genevie Ann M.D.   On: 09/03/2015 13:45     STUDIES:  5/23 V/Q > low prob PE 2022/09/02 RHC > mild PAH (likely group 3), with PRV 2.9 normal PCWP and CO, resting hypoxemia. No role for pulmonary vasodilators.  2022-09-02 CT Chest > Markedly abnormal pulmonary parenchyma which geographic areas of septal thickening and ground-glass opacity. Small amount of left pleural fluid with dependent atelectasis. Small to moderate pericardial effusion. Atherosclerosis of the aorta and the coronary arteries. 1 cm right paratracheal lymph node just beneath the thoracic inlet. There is not an extensive pattern of lymphadenopathy.  Autoimmune:  5/25  ESR >> 122>>>6/19- 34 CRP >> 34.1  RF >> 21.3 elevated, repeat same 6/19 ANA >> no results seen ANCA >> (-) ACE >> 22 Eosinophils >> 0.3  5/31 Jo1 >> <0.2 ANA >> (-) SCL70 >> <0.2 Sm >> <0.2 HIV >> (-) CCP >> (4)  CULTURES: 6/19 Bronch BAL  Pcp>>>neg  Culture>>>   ANTIBIOTICS:  SIGNIFICANT EVENTS: 6/19>> worsening respiratory failure, intubated  6/19 FOB, ett  LINES/TUBES: R [place] perm cath (pta)>>> ETT 6/19>>>  DISCUSSION: Mrs. Brallier is a 45F with progressive hypoxemic respiratory failure. Initial workup was notable for rhino/entero virus and resultant ARDS (now fibrotic stage) is certainly a possibility. Her CXR has shown marked progression with increasing involvement of the bases. It is difficult to know if there is a component of volume overload or this is merely progression of her underlying pathology. Interestingly, her initial CT showed more apical disease. She clearly does not fit a UIP pattern. It does not appear that she is steroid responsive and has not tolerated volume removal.  Now intubated, s/p FOB.   ASSESSMENT / PLAN:  PULMONARY A: Acute hypoxemic respiratory failure ILD unclear  etiology ARDS P:   Esr unimpressive Keep plat less 30 , apply ards protocol No IV steroids at this time, may need repeat challenge, esr unimpressive but did reduce after  steroids use  Re assess CT chest  Open lung ? pcxr in am  Even balance goals If able would wean 60% cpap 8, not 10 , if able  CARDIOVASCULAR A:  Hx CAD Hx abdominal aortic aneurysm repair HTN HLD Hypotension - with attempts at volume removal  P:  Neo off Map ok on own cvp 5  RENAL A:   ESRD P:   Renal following  HD per renal, likely in am  kvo  GASTROINTESTINAL HH, obsturction P:   pepcid   TF to goal once access obtained  HEMATOLOGIC Anemia - mild  P:  F/u CBC am SQ heparin   INFECTIOUS Low suspicion infection  P:   Monitor wbc, fever curve off abx  BAL follow  ENDOCRINE Hypothyroid  P:   Cont synthroid   NEUROLOGIC Sedation needs on vent  P:   RASS goal: -1 Fentanyl gtt  PRN versed  wua  FAMILY  - Updates:  Daughter updated at bedside 6/19, 6/20  - Inter-disciplinary family meet or Palliative Care meeting due by:  6/26  Ccm time 35 min   [name]. Titus Mould, MD, [place] Pgr: [facility]

## 2015-09-04 NOTE — Progress Notes (Signed)
eLink Physician-Brief Progress Note Patient Name: [name] DOB: Sep 16, 1934 MRN: [id number]   Date of Service  09/04/2015  HPI/Events of Note  Noted to have 700 ml urine on bladder scan.  eICU Interventions  Will have foley placed.     Intervention Category Major Interventions: Other:  Demarious Kapur 09/04/2015, 6:27 AM

## 2015-09-04 NOTE — Procedures (Signed)
Chest Tube Insertion Procedure Note  Indications:  Clinically significant right Pneumothorax -40% on CT  Pre-operative Diagnosis: Right Pneumothorax  Post-operative Diagnosis: Pneumothorax  Procedure Details  Informed consent was obtained for the procedure, including sedation.  Risks of lung perforation, hemorrhage, arrhythmia, and adverse drug reaction were discussed.   After sterile skin prep, using standard technique, a 14 French tube was placed in the right anterior axillary line 6th rib space.  Findings: Gush of air  Estimated Blood Loss:  Minimal         Specimens:  None              Complications:  None; patient tolerated the procedure well.         Disposition: ICU - intubated and critically ill.         Condition: stable  Attending Attestation: I performed the procedure.

## 2015-09-05 ENCOUNTER — Inpatient Hospital Stay (HOSPITAL_COMMUNITY): Payer: Medicare HMO

## 2015-09-05 DIAGNOSIS — Z789 Other specified health status: Secondary | ICD-10-CM

## 2015-09-05 DIAGNOSIS — Z9689 Presence of other specified functional implants: Secondary | ICD-10-CM | POA: Insufficient documentation

## 2015-09-05 DIAGNOSIS — J939 Pneumothorax, unspecified: Secondary | ICD-10-CM

## 2015-09-05 LAB — BLOOD GAS, ARTERIAL
ACID-BASE DEFICIT: 4.5 mmol/L — AB (ref 0.0–2.0)
Bicarbonate: 22.1 mEq/L (ref 20.0–24.0)
Drawn by: 44956
FIO2: 0.8
LHR: 35 {breaths}/min
MECHVT: 320 mL
O2 Saturation: 89.5 %
PATIENT TEMPERATURE: 99.7
PEEP/CPAP: 5 cmH2O
PO2 ART: 70.4 mmHg — AB (ref 80.0–100.0)
TCO2: 23.8 mmol/L (ref 0–100)
pCO2 arterial: 57.6 mmHg (ref 35.0–45.0)
pH, Arterial: 7.212 — ABNORMAL LOW (ref 7.350–7.450)

## 2015-09-05 LAB — CBC WITH DIFFERENTIAL/PLATELET
BASOS ABS: 0 10*3/uL (ref 0.0–0.1)
Basophils Relative: 0 %
Eosinophils Absolute: 0 10*3/uL (ref 0.0–0.7)
Eosinophils Relative: 0 %
HEMATOCRIT: 28.5 % — AB (ref 36.0–46.0)
Hemoglobin: 8.6 g/dL — ABNORMAL LOW (ref 12.0–15.0)
LYMPHS PCT: 1 %
Lymphs Abs: 0.2 10*3/uL — ABNORMAL LOW (ref 0.7–4.0)
MCH: 29.2 pg (ref 26.0–34.0)
MCHC: 30.2 g/dL (ref 30.0–36.0)
MCV: 96.6 fL (ref 78.0–100.0)
Monocytes Absolute: 0.2 10*3/uL (ref 0.1–1.0)
Monocytes Relative: 1 %
NEUTROS ABS: 15.5 10*3/uL — AB (ref 1.7–7.7)
NEUTROS PCT: 98 %
PLATELETS: 134 10*3/uL — AB (ref 150–400)
RBC: 2.95 MIL/uL — AB (ref 3.87–5.11)
RDW: 17.2 % — ABNORMAL HIGH (ref 11.5–15.5)
WBC: 15.8 10*3/uL — AB (ref 4.0–10.5)

## 2015-09-05 LAB — POCT I-STAT 3, ART BLOOD GAS (G3+)
Acid-base deficit: 4 mmol/L — ABNORMAL HIGH (ref 0.0–2.0)
Acid-base deficit: 6 mmol/L — ABNORMAL HIGH (ref 0.0–2.0)
BICARBONATE: 23.5 meq/L (ref 20.0–24.0)
Bicarbonate: 23.5 mEq/L (ref 20.0–24.0)
O2 Saturation: 91 %
O2 Saturation: 86 %
PCO2 ART: 55.1 mmHg — AB (ref 35.0–45.0)
PCO2 ART: 71.9 mmHg — AB (ref 35.0–45.0)
PH ART: 7.241 — AB (ref 7.350–7.450)
PO2 ART: 75 mmHg — AB (ref 80.0–100.0)
TCO2: 25 mmol/L (ref 0–100)
TCO2: 26 mmol/L (ref 0–100)
pH, Arterial: 7.123 — CL (ref 7.350–7.450)
pO2, Arterial: 69 mmHg — ABNORMAL LOW (ref 80.0–100.0)

## 2015-09-05 LAB — BASIC METABOLIC PANEL
ANION GAP: 13 (ref 5–15)
ANION GAP: 13 (ref 5–15)
BUN: 94 mg/dL — ABNORMAL HIGH (ref 6–20)
BUN: 99 mg/dL — ABNORMAL HIGH (ref 6–20)
CO2: 22 mmol/L (ref 22–32)
CO2: 23 mmol/L (ref 22–32)
Calcium: 8.1 mg/dL — ABNORMAL LOW (ref 8.9–10.3)
Calcium: 7.7 mg/dL — ABNORMAL LOW (ref 8.9–10.3)
Chloride: 96 mmol/L — ABNORMAL LOW (ref 101–111)
Chloride: 98 mmol/L — ABNORMAL LOW (ref 101–111)
Creatinine, Ser: 6.86 mg/dL — ABNORMAL HIGH (ref 0.44–1.00)
Creatinine, Ser: 7.06 mg/dL — ABNORMAL HIGH (ref 0.44–1.00)
GFR calc Af Amer: 6 mL/min — ABNORMAL LOW (ref >60)
GFR, EST AFRICAN AMERICAN: 6 mL/min — AB (ref >60)
GFR, EST NON AFRICAN AMERICAN: 5 mL/min — AB (ref >60)
GFR, EST NON AFRICAN AMERICAN: 5 mL/min — AB (ref >60)
GLUCOSE: 158 mg/dL — AB (ref 65–99)
GLUCOSE: 202 mg/dL — AB (ref 65–99)
POTASSIUM: 6.2 mmol/L — AB (ref 3.5–5.1)
POTASSIUM: 6.2 mmol/L — AB (ref 3.5–5.1)
SODIUM: 134 mmol/L — AB (ref 135–145)
Sodium: 131 mmol/L — ABNORMAL LOW (ref 135–145)

## 2015-09-05 LAB — RENAL FUNCTION PANEL
ANION GAP: 11 (ref 5–15)
Albumin: 2 g/dL — ABNORMAL LOW (ref 3.5–5.0)
BUN: 89 mg/dL — ABNORMAL HIGH (ref 6–20)
CHLORIDE: 98 mmol/L — AB (ref 101–111)
CO2: 25 mmol/L (ref 22–32)
CREATININE: 6.62 mg/dL — AB (ref 0.44–1.00)
Calcium: 8.1 mg/dL — ABNORMAL LOW (ref 8.9–10.3)
GFR calc non Af Amer: 5 mL/min — ABNORMAL LOW (ref >60)
GFR, EST AFRICAN AMERICAN: 6 mL/min — AB (ref >60)
Glucose, Bld: 146 mg/dL — ABNORMAL HIGH (ref 65–99)
POTASSIUM: 5.7 mmol/L — AB (ref 3.5–5.1)
Phosphorus: 6.7 mg/dL — ABNORMAL HIGH (ref 2.5–4.6)
Sodium: 134 mmol/L — ABNORMAL LOW (ref 135–145)

## 2015-09-05 LAB — TRIGLYCERIDES: Triglycerides: 171 mg/dL — ABNORMAL HIGH (ref <150)

## 2015-09-05 LAB — PROCALCITONIN: Procalcitonin: 14.67 ng/mL

## 2015-09-05 LAB — GLUCOSE, CAPILLARY
GLUCOSE-CAPILLARY: 122 mg/dL — AB (ref 65–99)
GLUCOSE-CAPILLARY: 151 mg/dL — AB (ref 65–99)
GLUCOSE-CAPILLARY: 178 mg/dL — AB (ref 65–99)
GLUCOSE-CAPILLARY: 193 mg/dL — AB (ref 65–99)
Glucose-Capillary: 145 mg/dL — ABNORMAL HIGH (ref 65–99)

## 2015-09-05 MED ORDER — ASPIRIN 81 MG PO CHEW
81.0000 mg | CHEWABLE_TABLET | Freq: Every day | ORAL | Status: DC
  Administered 2015-09-05: 81 mg via ORAL

## 2015-09-05 MED ORDER — CISATRACURIUM BOLUS VIA INFUSION
0.1000 mg/kg | Freq: Once | INTRAVENOUS | Status: AC
  Administered 2015-09-05: 5.3 mg via INTRAVENOUS
  Filled 2015-09-05: qty 6

## 2015-09-05 MED ORDER — PROPOFOL 1000 MG/100ML IV EMUL: 25.0000 ug/kg/min | INTRAVENOUS | Status: DC

## 2015-09-05 MED ORDER — ASPIRIN 81 MG PO CHEW
CHEWABLE_TABLET | ORAL | Status: AC
  Administered 2015-09-05: 10:00:00
  Filled 2015-09-05: qty 1

## 2015-09-05 MED ORDER — METHYLPREDNISOLONE SODIUM SUCC 125 MG IJ SOLR
125.0000 mg | Freq: Four times a day (QID) | INTRAMUSCULAR | Status: DC
Start: 2015-09-05 — End: 2015-09-06
  Administered 2015-09-05 (×2): 125 mg via INTRAVENOUS
  Filled 2015-09-05 (×2): qty 2

## 2015-09-05 MED ORDER — MIDAZOLAM HCL 2 MG/2ML IJ SOLN: 1.0000 mg | INTRAMUSCULAR | Status: DC | PRN

## 2015-09-05 MED ORDER — FENTANYL CITRATE (PF) 100 MCG/2ML IJ SOLN: 50.0000 ug | Freq: Once | INTRAMUSCULAR | Status: DC

## 2015-09-05 MED ORDER — NOREPINEPHRINE BITARTRATE 1 MG/ML IV SOLN
2.0000 ug/min | INTRAVENOUS | Status: DC
  Administered 2015-09-05: 20 ug/min via INTRAVENOUS
  Administered 2015-09-05: 2 ug/min via INTRAVENOUS
  Filled 2015-09-05: qty 16

## 2015-09-05 MED ORDER — FENTANYL CITRATE (PF) 100 MCG/2ML IJ SOLN: 100.0000 ug | Freq: Once | INTRAMUSCULAR | Status: DC | PRN

## 2015-09-05 MED ORDER — NOREPINEPHRINE BITARTRATE 1 MG/ML IV SOLN
2.0000 ug/min | INTRAVENOUS | Status: DC
  Administered 2015-09-05: 2 ug/min via INTRAVENOUS
  Filled 2015-09-05: qty 4

## 2015-09-05 MED ORDER — FENTANYL BOLUS VIA INFUSION
25.0000 ug | INTRAVENOUS | Status: DC | PRN
  Filled 2015-09-05: qty 25

## 2015-09-05 MED ORDER — PROPOFOL 1000 MG/100ML IV EMUL
0.0000 ug/kg/min | INTRAVENOUS | Status: DC
  Administered 2015-09-05: 50 ug/kg/min via INTRAVENOUS
  Administered 2015-09-05: 5 ug/kg/min via INTRAVENOUS
  Filled 2015-09-05 (×2): qty 100

## 2015-09-05 MED ORDER — MIDAZOLAM BOLUS VIA INFUSION
1.0000 mg | INTRAVENOUS | Status: DC | PRN
  Filled 2015-09-05: qty 2

## 2015-09-05 MED ORDER — ARTIFICIAL TEARS OP OINT
1.0000 "application " | TOPICAL_OINTMENT | Freq: Three times a day (TID) | OPHTHALMIC | Status: DC
  Administered 2015-09-05 (×2): 1 via OPHTHALMIC
  Filled 2015-09-05: qty 3.5

## 2015-09-05 MED ORDER — SODIUM POLYSTYRENE SULFONATE 15 GM/60ML PO SUSP
30.0000 g | Freq: Once | ORAL | Status: AC
  Administered 2015-09-05: 30 g
  Filled 2015-09-05: qty 120

## 2015-09-05 MED ORDER — SODIUM CHLORIDE 0.9 % IV BOLUS (SEPSIS): 1000.0000 mL | Freq: Once | INTRAVENOUS | Status: DC

## 2015-09-05 MED ORDER — DEXTROSE 5 % IV SOLN
2.0000 g | Freq: Once | INTRAVENOUS | Status: AC
  Administered 2015-09-05: 2 g via INTRAVENOUS
  Filled 2015-09-05: qty 2

## 2015-09-05 MED ORDER — SODIUM CHLORIDE 0.9 % IV SOLN
0.0000 mg/h | INTRAVENOUS | Status: DC
  Administered 2015-09-05: 2 mg/h via INTRAVENOUS
  Filled 2015-09-05: qty 10

## 2015-09-05 MED ORDER — VANCOMYCIN HCL 10 G IV SOLR
1250.0000 mg | Freq: Once | INTRAVENOUS | Status: AC
  Administered 2015-09-05: 1250 mg via INTRAVENOUS
  Filled 2015-09-05: qty 1250

## 2015-09-05 MED ORDER — INSULIN ASPART 100 UNIT/ML ~~LOC~~ SOLN
0.0000 [IU] | SUBCUTANEOUS | Status: DC
  Administered 2015-09-05 (×2): 3 [IU] via SUBCUTANEOUS

## 2015-09-05 MED ORDER — SODIUM CHLORIDE 0.9% FLUSH: 10.0000 mL | Freq: Two times a day (BID) | INTRAVENOUS | Status: DC

## 2015-09-05 MED ORDER — SODIUM CHLORIDE 0.9 % IV SOLN
3.0000 ug/kg/min | INTRAVENOUS | Status: DC
  Administered 2015-09-05: 3 ug/kg/min via INTRAVENOUS
  Filled 2015-09-05: qty 20

## 2015-09-05 MED ORDER — FENTANYL CITRATE (PF) 100 MCG/2ML IJ SOLN
100.0000 ug | Freq: Once | INTRAMUSCULAR | Status: DC
Start: 2015-09-05 — End: 2015-09-05

## 2015-09-05 MED ORDER — SODIUM CHLORIDE 0.9% FLUSH: 10.0000 mL | INTRAVENOUS | Status: DC | PRN

## 2015-09-05 MED ORDER — SODIUM BICARBONATE 8.4 % IV SOLN
INTRAVENOUS | Status: DC
  Administered 2015-09-05: 09:00:00 via INTRAVENOUS
  Filled 2015-09-05 (×2): qty 850

## 2015-09-05 MED ORDER — SODIUM CHLORIDE 0.9 % IV SOLN
25.0000 ug/h | INTRAVENOUS | Status: DC
  Filled 2015-09-05: qty 50

## 2015-09-05 MED ORDER — SODIUM CHLORIDE 0.9 % IV SOLN
100.0000 ug/h | INTRAVENOUS | Status: DC
  Filled 2015-09-05: qty 50

## 2015-09-05 MED ORDER — FENTANYL BOLUS VIA INFUSION
50.0000 ug | INTRAVENOUS | Status: DC | PRN
  Filled 2015-09-05: qty 50

## 2015-09-06 LAB — CULTURE, RESPIRATORY W GRAM STAIN
Culture: NORMAL
Culture: NORMAL

## 2015-09-06 LAB — CULTURE, RESPIRATORY

## 2015-09-06 LAB — GLUCOSE, CAPILLARY: GLUCOSE-CAPILLARY: 162 mg/dL — AB (ref 65–99)

## 2015-09-11 ENCOUNTER — Telehealth: Payer: Self-pay

## 2015-09-11 NOTE — Telephone Encounter (Signed)
On 09/11/2015 I received a death certificate from [place] (original). The death certificate is for burial. The patient is a patient of Doctor Titus Mould. The death certificate will be taken to [facility] for signature. On 10/07/2015 I received the death certificate back from Doctor Titus Mould. I got the death certificate ready and called the funeral home to let them know the death certificate was mailed to the [facility] Department per their request.

## 2015-09-12 ENCOUNTER — Ambulatory Visit: Payer: Medicare HMO | Admitting: Cardiology

## 2015-09-15 NOTE — Progress Notes (Signed)
Patient breathing over the ventilator at 40-46 time a minute. Dr. Halford Chessman called and notified. Propofol ordered to increase sedation. RASS -2 to -3 is to be maintained. Will continue to monitor patient.

## 2015-09-15 NOTE — Progress Notes (Signed)
Late entry for 6/19, 1mg  versed wasted with Paula Compton, RN.

## 2015-09-15 NOTE — Progress Notes (Signed)
CRITICAL VALUE ALERT  Critical value received: K 6.2 Date of notification:  09-28-2015  Time of notification:  1100  Critical value read back:Yes.    Nurse who received alert:  Carleene Cooper RN  MD notified (1st page):  Dr. Nadara Mode at bedside  Time of first page:  1100  MD notified (2nd page):  Time of second page:  Responding MD:  Dr. Nadara Mode  Time MD responded:  1100

## 2015-09-15 NOTE — Progress Notes (Signed)
Patient hypotensive while on Propofol. Dr. Halford Chessman called and notified. Levophed order given. Patient continues to breath over ventilator. Will continue to increase Fentanyl and Propofol. Will continue to monitor patient.

## 2015-09-15 NOTE — Progress Notes (Signed)
Pharmacy Antibiotic Note  [name] is a 81 y.o. female admitted on 09/14/2015 with sepsis.  Pharmacy has been consulted for vancomycin/ceftazidime dosing.  ESRD, awaiting plans for further HD.  Cultures negative to date, PCT pending.  Plan: 1. Vancomycin 1250 mg IV x 1 2. Ceftazidime 2g IV x 1 now. 3. F/u plan of care, further HD for re-dosing.  Height: 5\' 3"  (160 cm) Weight: 115 lb 11.9 oz (52.5 kg) IBW/kg (Calculated) : 52.4  Temp (24hrs), Avg:99.5 F (37.5 C), Min:97.7 F (36.5 C), Max:100.7 F (38.2 C)   Recent Labs Lab 09/14/2015 0540 08/19/2015 0548 09/01/15 0624 09/03/15 0323 09/03/15 0722 09/04/15 0450 09/04/15 0451 2015-09-12 0415  WBC 11.3*  --   --  11.5*  --   --  17.3* 15.8*  CREATININE  --  6.08* 3.62*  --  4.90* 5.11*  --  6.62*    Estimated Creatinine Clearance: 5.8 mL/min (by C-G formula based on Cr of 6.62).    Allergies  Allergen Reactions  . Codeine Rash  . Sulfonamide Derivatives Hives    Antimicrobials this admission: Vanc 6/21 > Ceftaz 6/21 >  Dose adjustments this admission: n/a  Microbiology results: 6/19 Resp Cx > ngtd 6/19 BAL > neg for pneumocystis  Thank you for allowing pharmacy to be a part of this patient's care.  Uvaldo Rising, BCPS  Clinical Pharmacist Pager [phone]  09-12-15 9:24 AM

## 2015-09-15 NOTE — Progress Notes (Signed)
eLink Physician-Brief Progress Note Patient Name: [name] DOB: 14-Dec-1934 MRN: [id number]   Date of Service  09/22/2015  HPI/Events of Note  ARDS protocol.  Needs more sedation to tolerate low Vt.   eICU Interventions  Add diprivan.  Goal RASS -2 to -3.     Intervention Category Major Interventions: Other:  Lyndzee Kliebert 2015-09-22, 12:04 AM

## 2015-09-15 NOTE — Progress Notes (Signed)
CRITICAL VALUE ALERT  Critical value received: K 6.2 Date of notification:  09/13/2015  Time of notification:  [id number]  Critical value read back:Yes.    Nurse who received alert:  Carleene Cooper RN  MD notified (1st page):  Expected value, MD not notified  Time of first page:  1739  MD notified (2nd page):  Time of second page:  Responding MD:  Expected value  Time MD responded: 1739

## 2015-09-15 NOTE — Progress Notes (Signed)
eLink Physician-Brief Progress Note Patient Name: [name] DOB: August 17, 1934 MRN: [id number]   Date of Service  09/26/15  HPI/Events of Note  Hypotensive after starting diprivan.  Would like to avoid fluid.     eICU Interventions  Will start levophed.     Intervention Category Major Interventions: Other:  Vernica Wachtel 2015-09-26, 12:54 AM

## 2015-09-15 NOTE — Discharge Summary (Signed)
[name], [name]                  ACCOUNT NO.:  [id number]  MEDICAL RECORD NO.:  [id number]  LOCATION:  2H11C                        FACILITY:  [facility]  PHYSICIAN:  [name], MD DATE OF BIRTH:  1934-04-30  DATE OF ADMISSION:  08/25/2015 DATE OF DISCHARGE:  10-Sep-2015                              DISCHARGE SUMMARY   This is an unfortunate 81 year old female with a history of coronary artery disease, systolic heart failure with ejection fraction known to be 45-50%, hypertension, end-stage renal disease, recent admission on Aug 01, 2015, to August 16, 2015, with acute hypoxemic respiratory failure with bilateral infiltrates throughout the lung fields with an extensive workup including a CT scan of the chest, and TEE, appearing as edema initially to the initial providers, then apparent changes consistent with interstitial lung disease, started on steroids with no significant response and was discharged and readmitted with progressive worsening to [facility] on August 20, 2015, and dyspnea, started on hemodialysis secondary to progressive kidney injury.  Especially, the patient's CT scan was noted worsening of bilateral infiltrates required intubation. These infiltrates were consistent with an interstitial lung process. The patient got worsened and had ARDS with bilateral spontaneous pneumothoraces requiring bilateral chest tubes as well as hypotension and significant paralysis secondary severe dyssynchrony with acidosis. The patient was having multiorgan dysfunction syndrome, she was not going to recover functionally from a quality life perspective, and family decided for comfort care which was provided, and the patient expired.  FINAL DIAGNOSIS UPON DEATH: 1. Interstitial lung disease of unclear etiology. 2. Acute respiratory distress syndrome. 3. Pneumonitis, likely. 4. Acute respiratory failure. 5. Acute on chronic renal failure.     [name],  MD     DJF/MEDQ  D:  09/12/2015  T:  09/13/2015  Job:  [id number]

## 2015-09-15 NOTE — Progress Notes (Signed)
20 mL of fentanyl and 15 mL of versed wasted in sink, witnessed by Beatrix Shipper, RN. 250 mL unused bag of Fentanyl returned to pharmacy by Cleotis Nipper, RN

## 2015-09-15 NOTE — Progress Notes (Signed)
   2015-09-26 1000  Clinical Encounter Type  Visited With Family  Visit Type Patient actively dying  Referral From Physician;Family  Spiritual Encounters  Spiritual Needs Emotional;Grief support;Prayer  Stress Factors  Family Stress Factors Loss  [place] present when MD discussed prognosis; family especially spouse very emotionally torn on course of action to follow; [place] offered all support including prayer and will continue to monitor throughout day; Gwynn Burly 10:11 AM

## 2015-09-15 NOTE — Progress Notes (Addendum)
2 RNs verified time of death via heart auscultation for 1 full minute. No heart sounds. Time of death is 20:41. Family at bedside.  Paulene Floor, RN

## 2015-09-15 NOTE — Progress Notes (Signed)
Dr. Halford Chessman called and notified about results of blood gas. No new orders given at this time. Will continue to monitor patient.

## 2015-09-15 NOTE — Progress Notes (Signed)
[facility] Progress Note   Subjective/Interval Events  Transferred to ICU after hypotension/acute resp event in HD 6/19 Intubated/bronched Central line placed - CVP 5 6/20 bilateral pneumothoraces - chest tubes Shock Prognosis felt poor by CCM Discussions about comfort care proceeding Asked to hold off on HD for now   Filed Vitals:   09-28-2015 0915 09-28-15 0927 09-28-2015 0930 28-Sep-2015 0935  BP: 83/45 96/56 138/74 121/64  Pulse: 88 115 124 120  Temp:      TempSrc:      Resp: 35 35 35 35  Height:      Weight:      SpO2: 91% 91% 92% 94%   Physical Exam: Intubated, sedated BP 121/64 mmHg  Pulse 120  Temp(Src) 100.6 F (38.1 C) (Axillary)  Resp 35  Ht 5' 3"  (1.6 m)  Wt 52.5 kg (115 lb 11.9 oz)  BMI 20.51 kg/m2  SpO2 94% Bilateral chest tubes Anteriorly fairly clear Rhythm regular S1S2 No S3 Abdomen non-distended, non-tender No peripheral edema R IJ [facility]    Inpatient medications: . antiseptic oral rinse  7 mL Mouth Rinse 10 times per day  . artificial tears  1 application Both Eyes K5G  . aspirin  81 mg Oral Daily  . budesonide  0.5 mg Nebulization BID  . calcitRIOL  0.25 mcg Oral Daily  . calcium acetate  2,001 mg Oral TID WC  . cefTAZidime (FORTAZ)  IV  2 g Intravenous Once  . chlorhexidine gluconate (SAGE KIT)  15 mL Mouth Rinse BID  . citalopram  20 mg Oral Daily  . [START ON 09/22/2015] cyanocobalamin  1,000 mcg Intramuscular Q30 days  . darbepoetin (ARANESP) injection - DIALYSIS  100 mcg Intravenous Q Wed-HD  . famotidine (PEPCID) IV  20 mg Intravenous Q24H  . feeding supplement (PRO-STAT SUGAR FREE 64)  30 mL Per Tube TID  . feeding supplement (VITAL AF 1.2 CAL)  1,000 mL Per Tube Q24H  . fentaNYL (SUBLIMAZE) injection  50 mcg Intravenous Once  . ferric gluconate (FERRLECIT/NULECIT) IV  125 mg Intravenous Q M,W,F-HD  . heparin  5,000 Units Subcutaneous Q8H  . ipratropium-albuterol  3 mL Nebulization Q6H  . levothyroxine  50 mcg Oral QAC  breakfast  . methylPREDNISolone (SOLU-MEDROL) injection  125 mg Intravenous Q6H  . senna-docusate  1 tablet Oral Daily  . sodium chloride  1,000 mL Intravenous Once  . sodium polystyrene  30 g Per Tube Once  . vancomycin  1,250 mg Intravenous Once   . cisatracurium (NIMBEX) infusion 3 mcg/kg/min (09-28-15 0830)  . fentaNYL infusion INTRAVENOUS    . midazolam (VERSED) infusion 2 mg/hr (09/28/15 0917)  . norepinephrine (LEVOPHED) Adult infusion 18 mcg/min (09/28/15 0935)  . propofol (DIPRIVAN) infusion Stopped (09/28/15 0917)  .  sodium bicarbonate 150 mEq in sterile water 1000 mL infusion 50 mL/hr at 09-28-15 0925   acetaminophen **OR** acetaminophen, bisacodyl, fentaNYL, midazolam, nitroGLYCERIN   Recent Labs Lab 09/03/15 0722  09/03/15 2022 09/04/15 0450 09-28-15 0415  NA 133*  --   --  134* 134*  K 4.2  --   --  5.1 5.7*  CL 97*  --   --  97* 98*  CO2 27  --   --  27 25  GLUCOSE 91  --   --  94 146*  BUN 78*  --   --  64* 89*  CREATININE 4.90*  --   --  5.11* 6.62*  CALCIUM 8.2*  --   --  8.1* 8.1*  PHOS 3.6  < > 6.0* 6.0* 6.7*  < > = values in this interval not displayed.  Recent Labs Lab 09/03/15 0722 09/04/15 0450 09/16/15 0415  ALBUMIN 2.4* 2.5* 2.0*    Recent Labs Lab 09/03/15 0323 09/04/15 0451 09/16/2015 0415  WBC 11.5* 17.3* 15.8*  NEUTROABS  --   --  15.5*  HGB 9.4* 10.0* 8.6*  HCT 30.3* 32.9* 28.5*  MCV 94.1 97.6 96.6  PLT 197 173 134*   Iron/TIBC/Ferritin/ %Sat    Component Value Date/Time   IRON 40 08/22/2015 0437   TIBC 190* 08/22/2015 0437   FERRITIN 646* 08/22/2015 0437   IRONPCTSAT 21 08/22/2015 0437  Dg Abd 1 View  09/04/2015  CLINICAL DATA:  Feeding tube placement. EXAM: ABDOMEN - 1 VIEW COMPARISON:  Abdominal radiograph 09/03/2015 FINDINGS: The feeding tube tip is in the fourth portion the duodenum near the duodenal-jejunal junction. IMPRESSION: Feeding tube placement with tip in the fourth portion of the duodenum. Electronically Signed    By: Marijo Sanes M.D.   On: 09/04/2015 15:18   Ct Chest Wo Contrast  09/04/2015  CLINICAL DATA:  Acute hypoxic respiratory failure. EXAM: CT CHEST WITHOUT CONTRAST TECHNIQUE: Multidetector CT imaging of the chest was performed following the standard protocol without IV contrast. COMPARISON:  Chest CT 08/08/2015 and chest x-ray 09/04/2015 FINDINGS: Mediastinum/Nodes: Right IJ center venous catheter is noted along with a smaller caliber left IJ catheter. They are in good position without complicating features. The endotracheal tube is also in good position and there is an NG tube coursing down the esophagus and into the stomach. Fairly extensive air tracking up into the neck from the chest due to bilateral pneumothoraces and pneumomediastinum. The heart is normal in size. No pericardial effusion. There is tortuosity, ectasia and calcification of the thoracic aorta. Dense 3 vessel coronary artery calcifications are noted. No mediastinal or hilar mass or adenopathy. Lungs/Pleura: There is a moderate-sized right pneumothorax estimated at 30-40%. There is also a small left-sided pneumothorax estimated at 10%. Extensive lung disease bilaterally with interstitial and airspace components. Upper abdomen: No significant upper abdominal findings. Advanced atherosclerotic calcifications, tortuosity and ectasia of the abdominal aorta. Branch vessel calcifications are also noted. The NG tube is in the duodenum. Musculoskeletal: No significant bony findings. IMPRESSION: 1. Approximately a 40% right-sided pneumothorax. 2. 10% left-sided pneumothorax. 3. Pneumomediastinum with air dissecting up into the neck. 4. Significant underlying lung disease with interstitial and airspace components. 5. Support apparatus in good position without complicating features. These results were called by telephone at the time of interpretation on 09/04/2015 at 7:28 pm to Dr. Corinna Lines, who verbally acknowledged these results. Electronically Signed    By: Marijo Sanes M.D.   On: 09/04/2015 19:28   Dg Chest Port 1 View  09/16/15  CLINICAL DATA:  Interstitial lung disease acute on chronic respiratory failure, chest tube treatment of bilateral pneumothoraces. EXAM: PORTABLE CHEST 1 VIEW COMPARISON:  Portable chest x-ray of September 04, 2015 FINDINGS: The lungs are better inflated today. The interstitial markings remain increased bilaterally with areas of confluence in the mid and lower lungs bilaterally. The tiny right apical pneumothorax is present. Bilateral small caliber pigtail chest tubes are stable in position. The heart remains enlarged. The pulmonary vascularity is indistinct. No significant pleural effusion is observed. The endotracheal tube tip lies 2.8 cm above the carina. The feeding tube tip projects below the inferior margin of the image likely in the duodenum. The dual-lumen dialysis catheter tip projects over the distal SVC.  IMPRESSION: Allowing for differences in positioning there has not been dramatic interval change since yesterday's study. A tiny right apical pneumothorax is suspected today. No definite left apical pneumothorax is observed. The support tubes are in stable position. Electronically Signed   By: David  Martinique M.D.   On: 2015-09-22 07:30   Dg Chest Port 1 View  09/04/2015  CLINICAL DATA:  Bilateral chest tube placement EXAM: PORTABLE CHEST 1 VIEW COMPARISON:  CT 09/04/2015 FINDINGS: Bilateral chest tube placement. LEFT pneumothorax noted with apical pleural edge 12 mm from the apical chest wall. The RIGHT pneumothorax only visualized laterally measuring 3 mm from the lateral chest wall. Endotracheal tube in good position. Feeding tube in the small bowel. RIGHT central venous line with tip in the distal SVC. Enlarged cardiac silhouette with low lung volumes. Bibasilar airspace disease. IMPRESSION: 1. Small bilateral pneumothoraces remain with bilateral chest tubes in place. 2. Bibasilar airspace disease remains. 3. Low lung  volumes. Electronically Signed   By: Suzy Bouchard M.D.   On: 09/04/2015 21:14   Dg Addison Bailey G Tube Plc W/fl-no Rad  09/04/2015  CLINICAL DATA:  NASO G TUBE PLACEMENT WITH FLUORO Fluoroscopy was utilized by the requesting physician.  No radiographic interpretation.    Assessment/Recommendations: 1. Respiratory failure. ILD unclear etiology. ARDS/bilat pneumothoraces->chest tubes. Empiric steroids and ATBs but with further deterioration. Shock on pressors. Etiology of ILD is uncertain. Prognosis felt to be extremely poor. 2. CKD 5, new ESRD - started HD on 6/8, s/p HD x 6 poorly tolerated. Now in shock. If full measure of support required, would need CRRT but this would NOT change long term outcome or prognosis and do not favor. 3. Secondary HPT  4. Access - LUE BC AVF (04/20/15)- AVF infiltrated/restedR IJ [facility] cath in place (08/22/2015 Dr. Rosana Hoes [facility]) 5. Anemia Hb 10. Aranesp. 6. CAD/hx of stent 7. AAA sp stent-graft repair 8. Disposition - CCM moving toward comfort care, pending discussion with family.  Jamal Maes, MD [facility] [phone] Pager 09/22/15, 9:45 AM

## 2015-09-15 NOTE — Progress Notes (Addendum)
PULMONARY / CRITICAL CARE MEDICINE   Name: DENETRA FORMOSO MRN: [id number] DOB: Jan 30, 1935    ADMISSION DATE:  08/18/2015 CONSULTATION DATE:  09/01/15  REFERRING MD:  Sheliah Plane Eliseo Squires)   CHIEF COMPLAINT:  Hypoxic respiratory failure   HISTORY OF PRESENT ILLNESS:   81yo female with hx CAD, sCHF (EF 45-50%), HTN, ESRD with recent admission 5/17-6/1 with acute hypoxic respiratory failure with bilateral infiltrates thought r/t ILD v edema.  During that admission she had an extensive workup including CT chest, V/Q scan and TTE. She was started on solumedrol that admission to see if her ground glass opacities might be steroid responsive and discharged on a long taper. She reports minimal if any improvement with steroids. She had a thorough autoimmune workup as well, which was notable only for a RF of 21. CCP was negative.  She returned to [facility] 6/5 with progressive dyspnea.  She was started on HD for progressive CKD but remained completely intolerant of any volume removal.  She was tx to [facility] for ongoing respiratory failure on 6/16.  On 6/19 during HD she was again completely intolerant of even 567m of volume removal with worsening respiratory status despite bipap and PCCM consulted for ICU tx.    SUBJECTIVE:  bilat spont ptx, ct placed acidosis Hypotensive overnight, dyssynchronous on the vent, now on Levophed, paralytic. Bilateral chest tubes.   VITAL SIGNS: BP 95/50 mmHg  Pulse 126  Temp(Src) 100.6 F (38.1 C) (Axillary)  Resp 25  Ht 5' 3"  (1.6 m)  Wt 115 lb 11.9 oz (52.5 kg)  BMI 20.51 kg/m2  SpO2 90%  HEMODYNAMICS: CVP:  [7 mmHg-9 mmHg] 7 mmHg  VENTILATOR SETTINGS: Vent Mode:  [-] PRVC FiO2 (%):  [60 %-100 %] 100 % Set Rate:  [18 bmp-35 bmp] 35 bmp Vt Set:  [320 mL-420 mL] 320 mL PEEP:  [5 cmH20-10 cmH20] 5 cmH20 Plateau Pressure:  [20 cmH20-29 cmH20] 29 cmH20  INTAKE / OUTPUT: I/O last 3 completed shifts: In: 1333.8 [I.V.:736.5; Other:20; NG/GT:527.3; IV Piggyback:50] Out: 8121 [Urine:832]  PHYSICAL EXAMINATION: General:  Sedated, paralyzed on vent Neuro: Sedated, intubated, paralyzed. RASS-5 CV: Tachycardic, regular rhythm, no murmurs.  PULM:  Coarse bilateral, scattered crackles.  GI: Round, soft, non tender  Extremities:  Warm and dry, no edema   LABS:  BMET  Recent Labs Lab 09/03/15 0722 09/04/15 0450 [id number]  NA 133* 134* 134*  K 4.2 5.1 5.7*  CL 97* 97* 98*  CO2 27 27 25   BUN 78* 64* 89*  CREATININE 4.90* 5.11* 6.62*  GLUCOSE 91 94 146*    Electrolytes  Recent Labs Lab 09/03/15 0722  09/03/15 2022 09/04/15 0450 09/04/15 1650 [id number]  CALCIUM 8.2*  --   --  8.1*  --  8.1*  MG  --   < > 2.2 2.1 2.2  --   PHOS 3.6  < > 6.0* 6.0*  --  6.7*  < > = values in this interval not displayed.  CBC  Recent Labs Lab 09/03/15 0323 09/04/15 0451 0Jul 16, [id number]  WBC 11.5* 17.3* 15.8*  HGB 9.4* 10.0* 8.6*  HCT 30.3* 32.9* 28.5*  PLT 197 173 134*    Coag's No results for input(s): APTT, INR in the last 168 hours.  Sepsis Markers No results for input(s): LATICACIDVEN, PROCALCITON, O2SATVEN in the last 168 hours.  ABG  Recent Labs Lab 0Jul 16, [id number]  PHART 7.241* 7.212* 7.123*  PCO2ART 55.1* 57.6* 71.9*  PO2ART 75.0* 70.4* 69.0*  Liver Enzymes  Recent Labs Lab 09/03/15 0722 09/04/15 0450 09-22-15 0415  ALBUMIN 2.4* 2.5* 2.0*    Cardiac Enzymes No results for input(s): TROPONINI, PROBNP in the last 168 hours.  Glucose  Recent Labs Lab 09/04/15 0727 09/04/15 1129 09/04/15 1538 09/04/15 1954 22-Sep-2015 0029 22-Sep-2015 0409  GLUCAP 87 86 84 123* 122* 145*    Imaging Dg Abd 1 View  09/04/2015  CLINICAL DATA:  Feeding tube placement. EXAM: ABDOMEN - 1 VIEW COMPARISON:  Abdominal radiograph 09/03/2015 FINDINGS: The feeding tube tip is in the fourth portion the duodenum near the duodenal-jejunal junction. IMPRESSION: Feeding tube placement with tip in the fourth portion of the  duodenum. Electronically Signed   By: Marijo Sanes M.D.   On: 09/04/2015 15:18   Ct Chest Wo Contrast  09/04/2015  CLINICAL DATA:  Acute hypoxic respiratory failure. EXAM: CT CHEST WITHOUT CONTRAST TECHNIQUE: Multidetector CT imaging of the chest was performed following the standard protocol without IV contrast. COMPARISON:  Chest CT 08/08/2015 and chest x-ray 09/04/2015 FINDINGS: Mediastinum/Nodes: Right IJ center venous catheter is noted along with a smaller caliber left IJ catheter. They are in good position without complicating features. The endotracheal tube is also in good position and there is an NG tube coursing down the esophagus and into the stomach. Fairly extensive air tracking up into the neck from the chest due to bilateral pneumothoraces and pneumomediastinum. The heart is normal in size. No pericardial effusion. There is tortuosity, ectasia and calcification of the thoracic aorta. Dense 3 vessel coronary artery calcifications are noted. No mediastinal or hilar mass or adenopathy. Lungs/Pleura: There is a moderate-sized right pneumothorax estimated at 30-40%. There is also a small left-sided pneumothorax estimated at 10%. Extensive lung disease bilaterally with interstitial and airspace components. Upper abdomen: No significant upper abdominal findings. Advanced atherosclerotic calcifications, tortuosity and ectasia of the abdominal aorta. Branch vessel calcifications are also noted. The NG tube is in the duodenum. Musculoskeletal: No significant bony findings. IMPRESSION: 1. Approximately a 40% right-sided pneumothorax. 2. 10% left-sided pneumothorax. 3. Pneumomediastinum with air dissecting up into the neck. 4. Significant underlying lung disease with interstitial and airspace components. 5. Support apparatus in good position without complicating features. These results were called by telephone at the time of interpretation on 09/04/2015 at 7:28 pm to Dr. Corinna Lines, who verbally acknowledged these  results. Electronically Signed   By: Marijo Sanes M.D.   On: 09/04/2015 19:28   Dg Chest Port 1 View  09/22/15  CLINICAL DATA:  Interstitial lung disease acute on chronic respiratory failure, chest tube treatment of bilateral pneumothoraces. EXAM: PORTABLE CHEST 1 VIEW COMPARISON:  Portable chest x-ray of September 04, 2015 FINDINGS: The lungs are better inflated today. The interstitial markings remain increased bilaterally with areas of confluence in the mid and lower lungs bilaterally. The tiny right apical pneumothorax is present. Bilateral small caliber pigtail chest tubes are stable in position. The heart remains enlarged. The pulmonary vascularity is indistinct. No significant pleural effusion is observed. The endotracheal tube tip lies 2.8 cm above the carina. The feeding tube tip projects below the inferior margin of the image likely in the duodenum. The dual-lumen dialysis catheter tip projects over the distal SVC. IMPRESSION: Allowing for differences in positioning there has not been dramatic interval change since yesterday's study. A tiny right apical pneumothorax is suspected today. No definite left apical pneumothorax is observed. The support tubes are in stable position. Electronically Signed   By: David  Martinique M.D.   On: 09-22-2015  07:30   Dg Chest Port 1 View  09/04/2015  CLINICAL DATA:  Bilateral chest tube placement EXAM: PORTABLE CHEST 1 VIEW COMPARISON:  CT 09/04/2015 FINDINGS: Bilateral chest tube placement. LEFT pneumothorax noted with apical pleural edge 12 mm from the apical chest wall. The RIGHT pneumothorax only visualized laterally measuring 3 mm from the lateral chest wall. Endotracheal tube in good position. Feeding tube in the small bowel. RIGHT central venous line with tip in the distal SVC. Enlarged cardiac silhouette with low lung volumes. Bibasilar airspace disease. IMPRESSION: 1. Small bilateral pneumothoraces remain with bilateral chest tubes in place. 2. Bibasilar airspace  disease remains. 3. Low lung volumes. Electronically Signed   By: Suzy Bouchard M.D.   On: 09/04/2015 21:14   Dg Addison Bailey G Tube Plc W/fl-no Rad  09/04/2015  CLINICAL DATA:  NASO G TUBE PLACEMENT WITH FLUORO Fluoroscopy was utilized by the requesting physician.  No radiographic interpretation.     STUDIES:  5/23 V/Q > low prob PE 08/25/2022 RHC > mild PAH (likely group 3), with PRV 2.9 normal PCWP and CO, resting hypoxemia. No role for pulmonary vasodilators.  08-25-2022 CT Chest > Markedly abnormal pulmonary parenchyma which geographic areas of septal thickening and ground-glass opacity. Small amount of left pleural fluid with dependent atelectasis. Small to moderate pericardial effusion. Atherosclerosis of the aorta and the coronary arteries. 1 cm right paratracheal lymph node just beneath the thoracic inlet. There is not an extensive pattern of lymphadenopathy. 6/20 CT chest > Approximately a 40% right-sided pneumothorax.  10% left-sided pneumothorax. Pneumomediastinum with air dissecting up into the neck. Significant underlying lung disease with interstitial and airspace components.  Autoimmune:  5/25  ESR >> 122>>>6/19- 34 CRP >> 34.1  RF >> 21.3 elevated, repeat same 6/19 ANA >> no results seen ANCA >> (-) ACE >> 22 Eosinophils >> 0.3  5/31 Jo1 >> <0.2 ANA >> (-) SCL70 >> <0.2 Sm >> <0.2 HIV >> (-) CCP >> (4)  CULTURES: 6/19 Bronch BAL Pcp>>>neg Culture>>>  RVP PCR BAL 6/19 > neg  ANTIBIOTICS: 6/21 ceftaz>>> 6/21 vanc>>>  SIGNIFICANT EVENTS: 6/19>> worsening respiratory failure, intubated  6/19 FOB, ett 6/20 Bilateral pneumothoraces while on PEEP of 10. Bilateral chest tubes placed.  6/21 Started on Levophed, paralyzed  LINES/TUBES: R  perm cath (pta)>>> ETT 6/19>>> R Chest tune 6/20 >>> L chest tube 6/20 >>>  DISCUSSION: [name] is a 84F with progressive hypoxemic respiratory failure. Initial workup was notable for rhino/entero virus and resultant ARDS (now  fibrotic stage) is certainly a possibility. Her CXR has shown marked progression with increasing involvement of the bases. It is difficult to know if there is a component of volume overload or this is merely progression of her underlying pathology. Interestingly, her initial CT showed more apical disease. She clearly does not fit a UIP pattern. It does not appear that she is steroid responsive and has not tolerated volume removal.  She was intubated and required high PEEP ventilation under ARDS protocol. She developed bilateral pneumothoraces 6/20 requiring bilateral chest tubes.   ASSESSMENT / PLAN:  PULMONARY A: Acute hypoxemic respiratory failure ILD unclear etiology ARDS Bilateral PTX- spont ards Left sided air leak P:   Keep plat less 30 , apply ards protocol >> however now keep PEEP 5 and wean FiO2 to keep SpO2 > 88% Chest tubes to 20 cmH2O suction. Even balance goals Increase MV as able, keep plat less 30  Add bicarb for ards, ph less 7.25 , maxed MV Increase suction  40 on leak side Repeat abg Consider empiric steroids   CARDIOVASCULAR A:  Hx CAD Hx abdominal aortic aneurysm repair HTN HLD Hypotension on Levophed P:  Telemetry monitoring Goal euvolemia Continued CVP monitoring, cvp 7, bolus Continue Levophed Dc propofol  RENAL A:   ESRD Mild rise K  P:   Renal following  Will need CRRT if we continued aggressive support kayxlate Bicarb bmet q8h  GASTROINTESTINAL HH, obsturction P:   Pepcid for SUP   TF to goal once access obtained Monitor BM  HEMATOLOGIC Anemia - mild  P:  F/u CBC am SQ heparin   INFECTIOUS Low suspicion infection , worsening clinical status however P:   Monitor wbc, fever curve off abx  Follow BAL  Add empiric abx  ENDOCRINE Hypothyroid  P:   Cont synthroid  ssi  NEUROLOGIC Sedation Paralyzed Severe ards P:   RASS goal: -4 Fentanyl gtt, Propofol gtt PRN versed  Nimbex gtt  FAMILY  - Updates:  Family meeting  this AM   Natasha Bence, MD PGY-3, Internal Medicine Pager: [phone]   STAFF NOTE: Linwood Dibbles, MD FACP have personally reviewed patient's available data, including medical history, events of note, physical examination and test results as part of my evaluation. I have discussed with resident/NP and other care providers such as pharmacist, RN and RRT. In addition, I personally evaluated patient and elicited key findings of: sedated heavy, paralyzed now, leak noted LEFT, major events overnight reviewed, interesting of PTX anterior so pcxr that day prior did not show, CT to suction, increase to 40, add empiric steroids, add empiric abx based on clinical declines, although unlikely to be infected, will follow BAL, permissive hypercapnia, increase TV by 1 cc.kg, keep plat less 30, add bicarb for ph less 7.25 ards, ctonied TF, dc prop in setting shock, add versed drip, bolus as cvp 7,  I will meet with daughter now, I feel that comfort would be reasonable, her abiluity to survive this with quality is extremely low, repeat abg, keep rate 35, peep at 5 to avoid major leak, butmay need increase 7-8 to reduce O2, poor prognosis The patient is critically ill with multiple organ systems failure and requires high complexity decision making for assessment and support, frequent evaluation and titration of therapies, application of advanced monitoring technologies and extensive interpretation of multiple databases.   Critical Care Time devoted to patient care services described in this note is 45 Minutes. This time reflects time of care of this signee: Merrie Roof, MD FACP. This critical care time does not reflect procedure time, or teaching time or supervisory time of PA/NP/Med student/Med Resident etc but could involve care discussion time. Rest per NP/medical resident whose note is outlined above and that I agree with   [name]. Titus Mould, MD, FACP Pgr: [facility] 09/12/15 8:52 AM   I have had extensive discussions with family daughter. We discussed patients current circumstances and organ failures. We also discussed patient's prior wishes under circumstances such as this. Family has decided to NOT perform resuscitation if arrest but to continue current medical support for now. i also started conversation comfort. NO hd until we decide further the plan of care  [name]. Titus Mould, MD, [place] Pgr: [facility]

## 2015-09-15 DEATH — deceased

## 2016-02-19 ENCOUNTER — Other Ambulatory Visit (HOSPITAL_COMMUNITY): Payer: Medicare HMO

## 2016-02-19 ENCOUNTER — Ambulatory Visit: Payer: Medicare HMO | Admitting: Family

## 2017-11-09 IMAGING — NM NM PULMONARY VENT & PERF
16 series · 16 of 16 positions shown · non-contrast
Comparison: Chest x-ray 08/07/2015.

CLINICAL DATA: Chest pain.  Shortness of breath.

EXAM:
NUCLEAR MEDICINE VENTILATION - PERFUSION LUNG SCAN
TECHNIQUE: Ventilation images were obtained in multiple projections using
inhaled aerosol 2c-UUm DTPA. Perfusion images were obtained in
multiple projections after intravenous injection of 2c-UUm MAA.
RADIOPHARMACEUTICALS:  30.5 mCi 5echnetium-PPm DTPA aerosol
inhalation and 4.1 mCi 5echnetium-PPm MAA IV

[Series 1: ant/post vent · 4.14mm/px · 1 of 1 slices shown (1 of 2)]
[im 1/1]
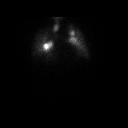

[Series 1: ant/post vent · 4.14mm/px · 1 of 1 slices shown (2 of 2)]
[im 1/1]
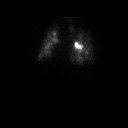

[Series 2: lao/rpo vent · 4.14mm/px · 1 of 1 slices shown (1 of 2)]
[im 1/1]
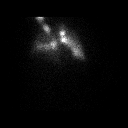

[Series 2: lao/rpo vent · 4.14mm/px · 1 of 1 slices shown (2 of 2)]
[im 1/1]
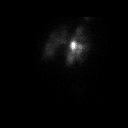

[Series 3: lpo/rao vent · 4.14mm/px · 1 of 1 slices shown (1 of 2)]
[im 1/1]
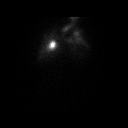

[Series 3: lpo/rao vent · 4.14mm/px · 1 of 1 slices shown (2 of 2)]
[im 1/1]
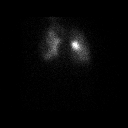

[Series 4: lt lat/rt lat vent · 4.14mm/px · 1 of 1 slices shown (1 of 2)]
[im 1/1]
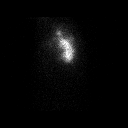

[Series 4: lt lat/rt lat vent · 4.14mm/px · 1 of 1 slices shown (2 of 2)]
[im 1/1]
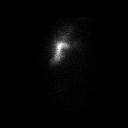

[Series 5: lt lat/rt lat perf · 4.14mm/px · 1 of 1 slices shown (1 of 2)]
[im 1/1]
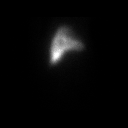

[Series 5: lt lat/rt lat perf · 4.14mm/px · 1 of 1 slices shown (2 of 2)]
[im 1/1]
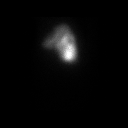

[Series 6: lpo/rao perf · 4.14mm/px · 1 of 1 slices shown (1 of 2)]
[im 1/1]
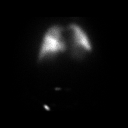

[Series 6: lpo/rao perf · 4.14mm/px · 1 of 1 slices shown (2 of 2)]
[im 1/1]
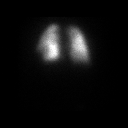

[Series 7: ant/post perf · 4.14mm/px · 1 of 1 slices shown (1 of 2)]
[im 1/1]
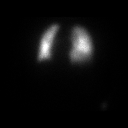

[Series 7: ant/post perf · 4.14mm/px · 1 of 1 slices shown (2 of 2)]
[im 1/1]
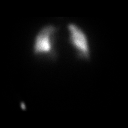

[Series 8: lao/rpo perf · 4.14mm/px · 1 of 1 slices shown (1 of 2)]
[im 1/1]
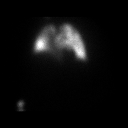

[Series 8: lao/rpo perf · 4.14mm/px · 1 of 1 slices shown (2 of 2)]
[im 1/1]
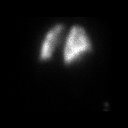

[16 of 16 positions shown; findings below may reference images not displayed]

FINDINGS: Extremely poor ventilation bilaterally consistent with previously
identified bilateral pulmonary edema and/or pneumonia. No prominent
perfusion defects noted. Tiny perfusion defects noted are smaller
than ventilatory defects.
IMPRESSION: Low probability pulmonary embolus.

## 2017-11-12 IMAGING — CR DG CHEST 2V
2 series · 2 of 2 positions shown · non-contrast
Comparison: CT chest 08/08/2015

ADDENDUM:
The patient does not have a nasogastric tube. The structure felt to
be a nasogastric tube is indeed an EKG lead. The patient's nurse is
aware of these findings.
CLINICAL DATA: Bilateral lung infiltrates, cough

EXAM:
CHEST  2 VIEW

[chest pa]
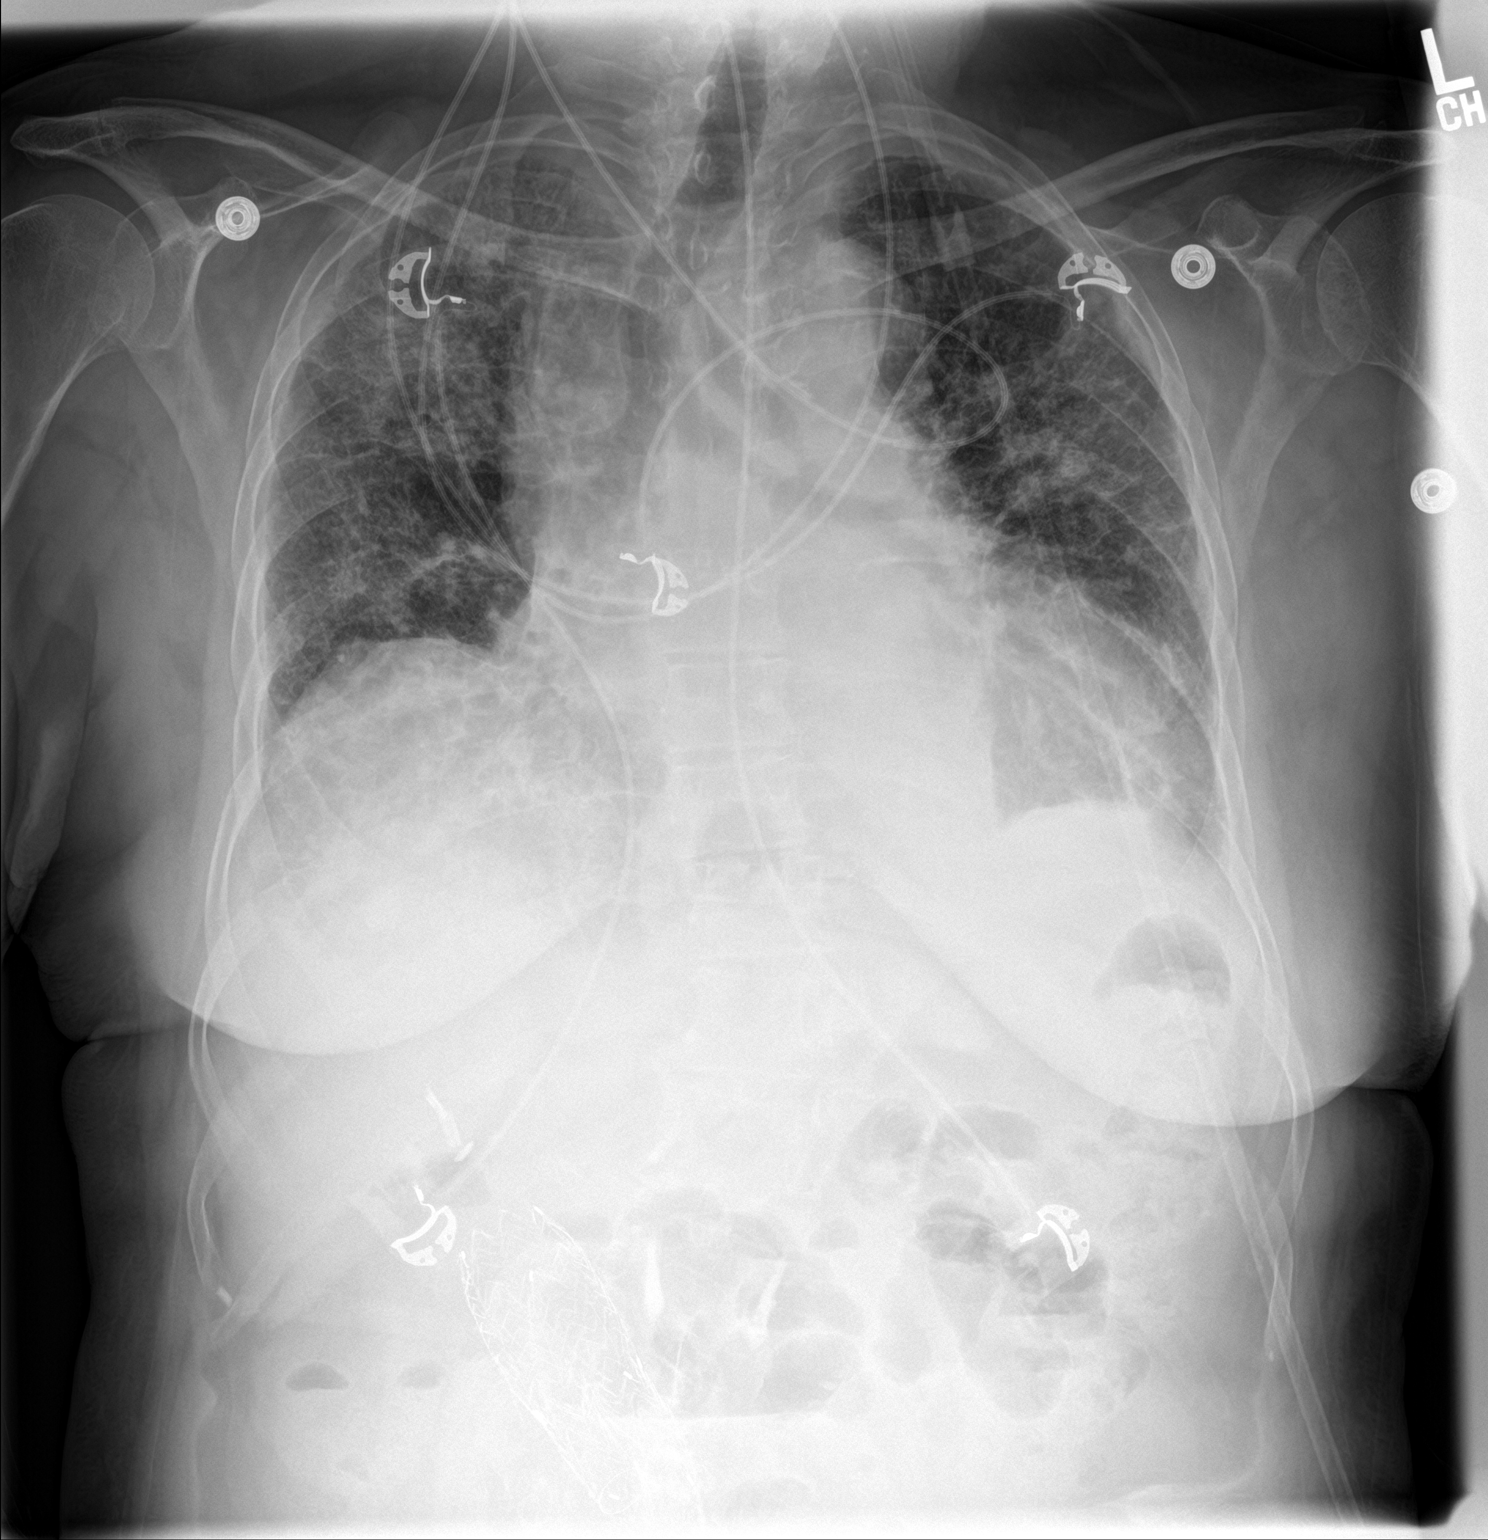

[chest lat]
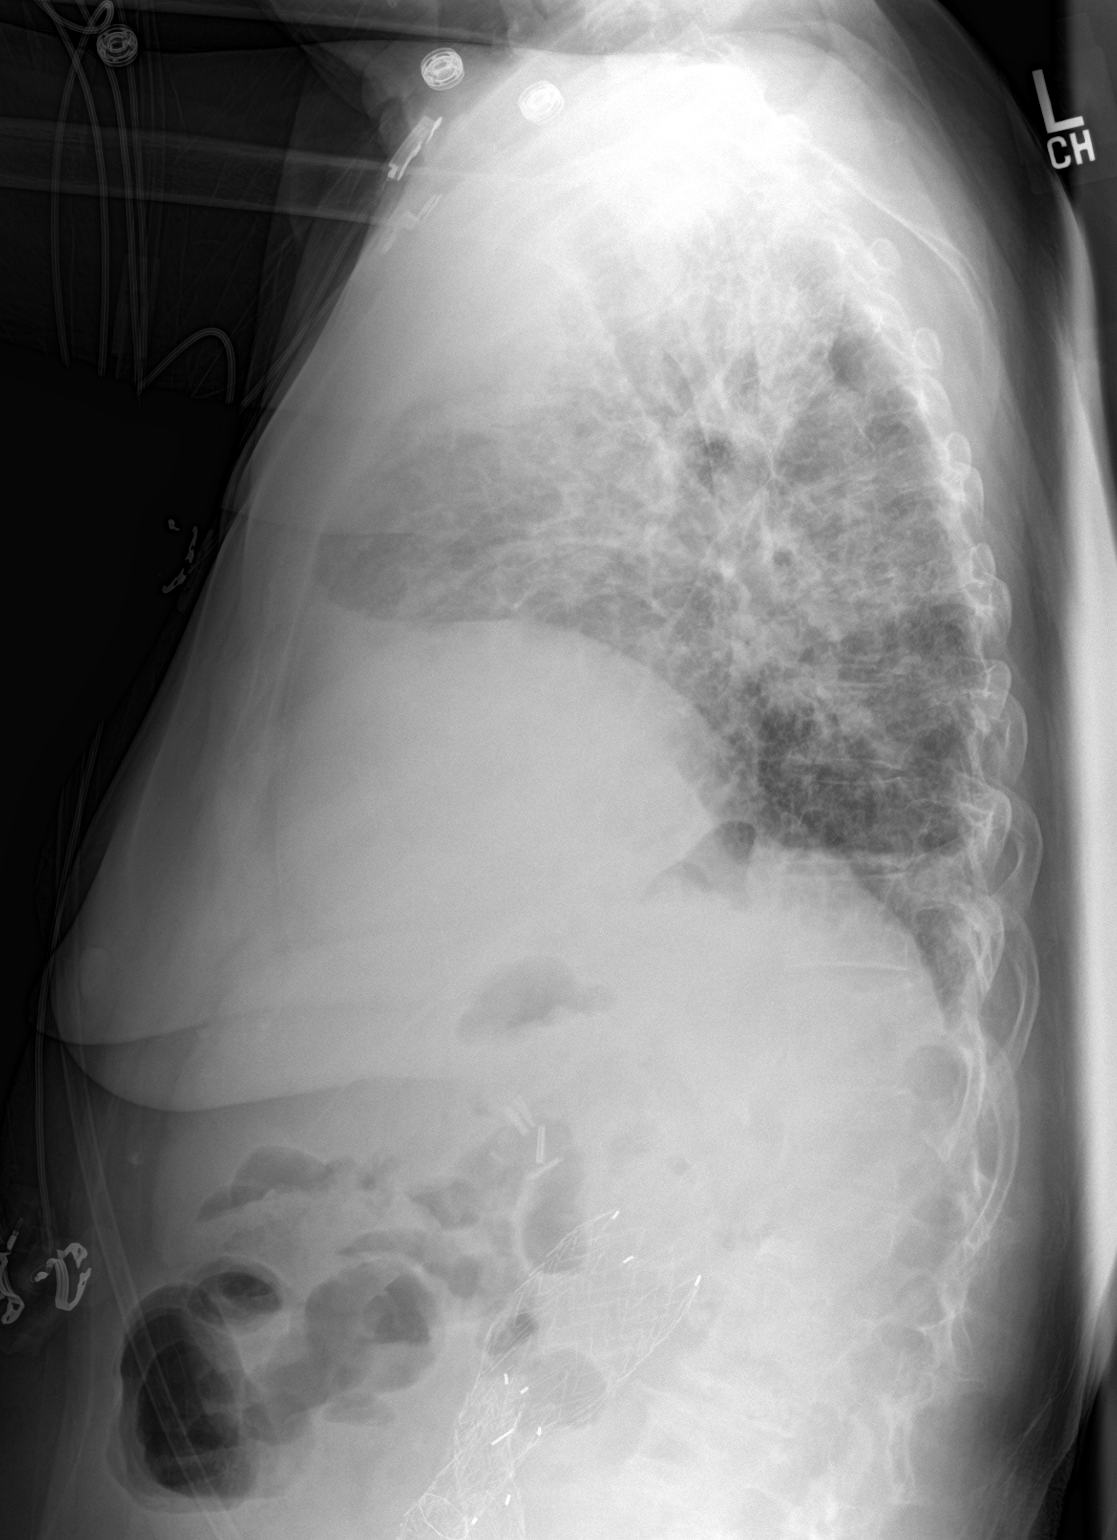

[2 of 2 positions shown; findings below may reference images not displayed]

FINDINGS: There are bilateral patchy areas of airspace disease. There is a
trace left pleural effusion. There is no pneumothorax. There is
stable cardiomegaly.

There is a nasogastric tube with the tip projecting over the
stomach.

The osseous structures are unremarkable.
IMPRESSION: Bilateral patchy areas of airspace disease.

## 2017-11-15 IMAGING — CR DG CHEST 2V
2 series · 2 of 2 positions shown · non-contrast
Comparison: 08/11/2015 chest radiograph.

CLINICAL DATA: Shortness of breath

EXAM:
CHEST  2 VIEW

[chest pa]
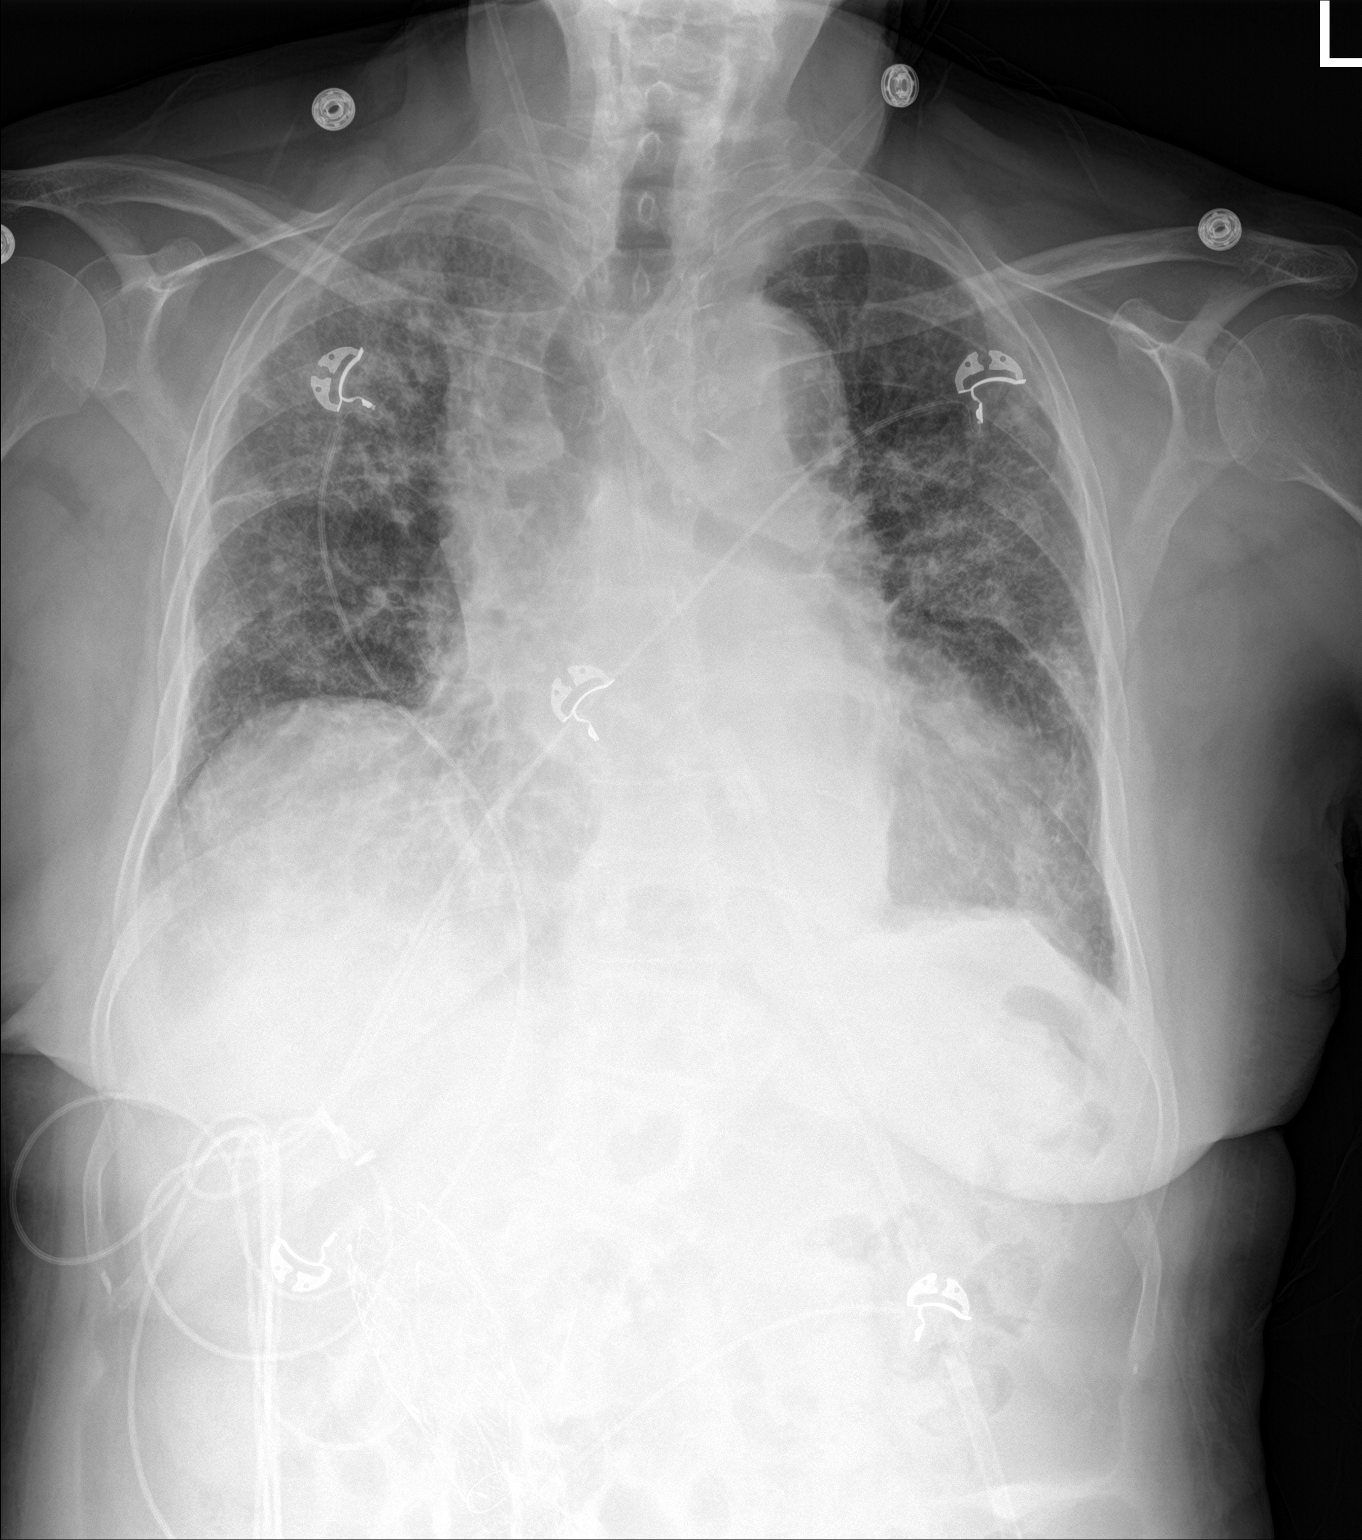

[chest lat]
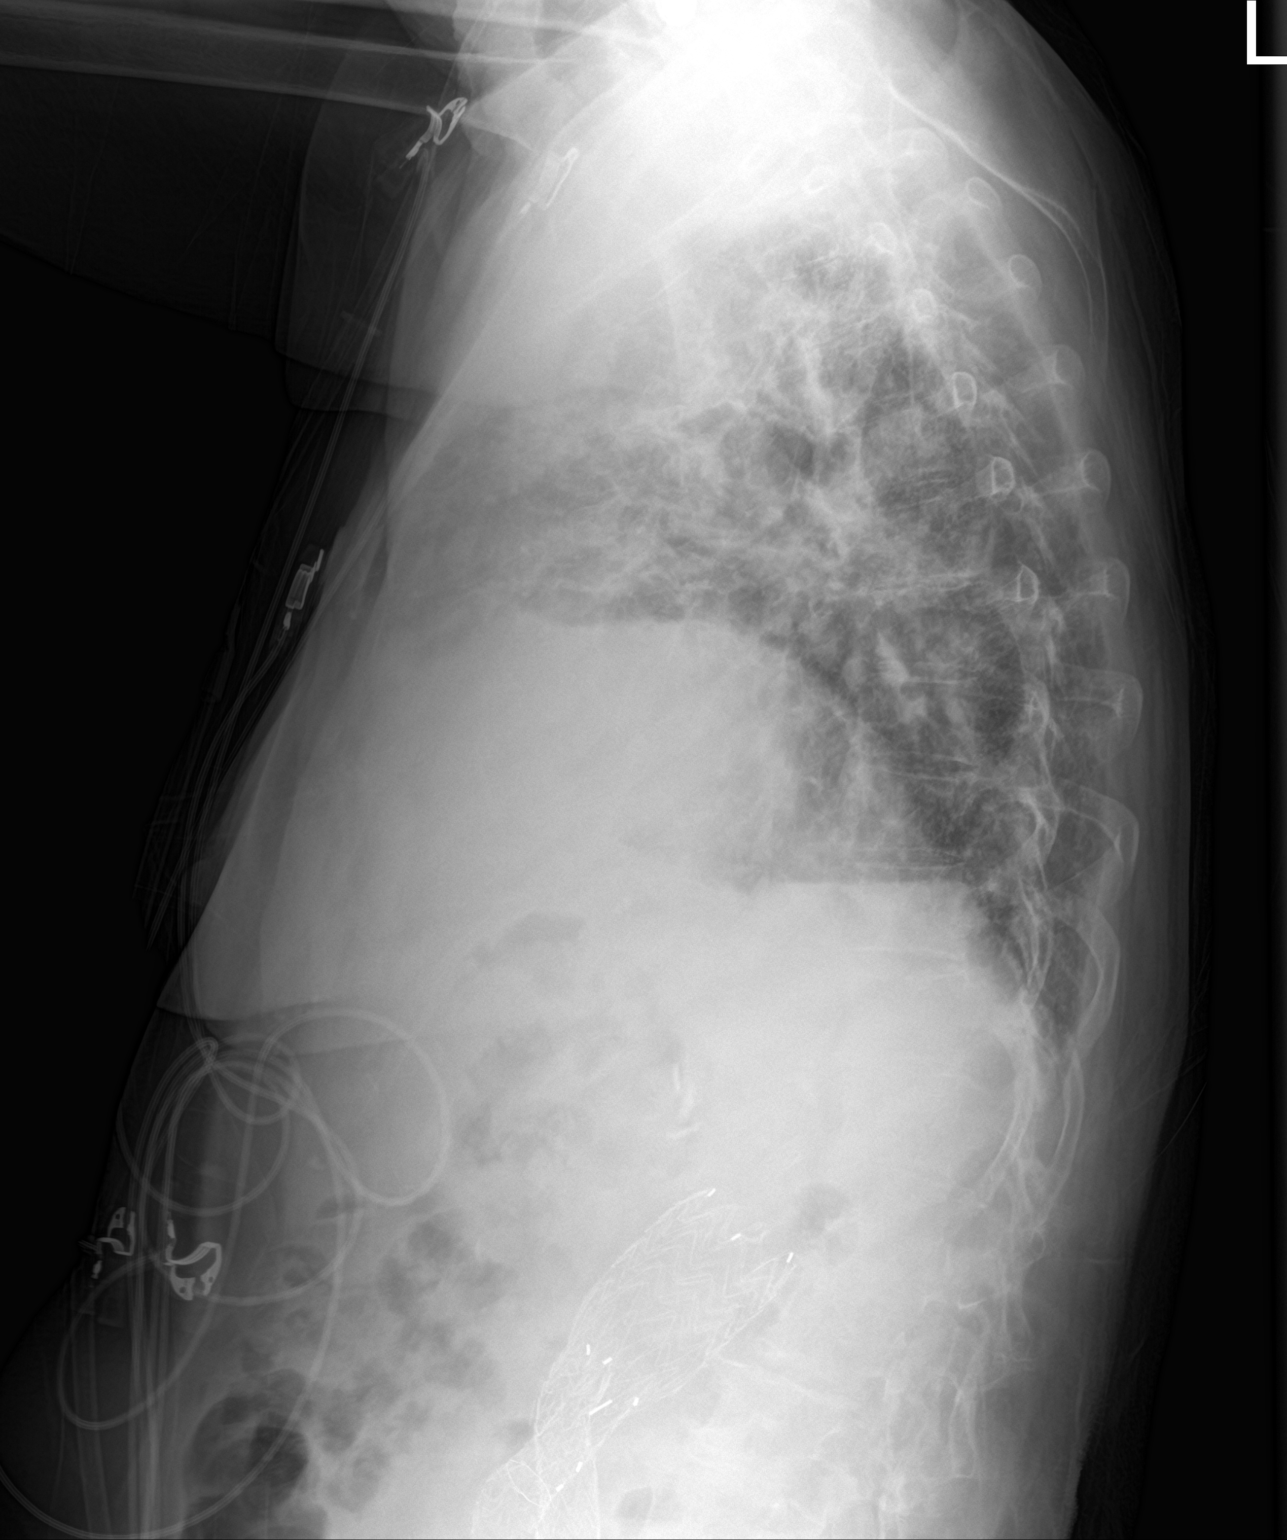

[2 of 2 positions shown; findings below may reference images not displayed]

FINDINGS: Stable cardiomediastinal silhouette with mild cardiomegaly. No
pneumothorax. No pleural effusion. Stable elevation of the right
hemidiaphragm. Improved lung volumes. Patchy consolidation and
reticular opacities throughout both lungs are not appreciably
changed. Partially visualized aorto bi-iliac stent graft in the
medial right abdomen.
IMPRESSION: Improved lung volumes. Patchy consolidation and reticular opacities
throughout both lungs are not appreciably changed, in the
differential includes multilobar infectious or atypical inflammatory
pneumonias. Stable cardiomegaly.
